# Patient Record
Sex: Male | Born: 1935 | Race: White | Hispanic: No | Marital: Married | Smoking: Never smoker
Health system: Southern US, Community
[De-identification: ages and names within clinical notes are randomized; demographics above are authoritative.]

## PROBLEM LIST (undated history)

## (undated) DIAGNOSIS — F29 Unspecified psychosis not due to a substance or known physiological condition: Secondary | ICD-10-CM

## (undated) DIAGNOSIS — K219 Gastro-esophageal reflux disease without esophagitis: Secondary | ICD-10-CM

## (undated) DIAGNOSIS — E669 Obesity, unspecified: Secondary | ICD-10-CM

## (undated) DIAGNOSIS — R51 Headache: Secondary | ICD-10-CM

## (undated) DIAGNOSIS — R531 Weakness: Secondary | ICD-10-CM

## (undated) DIAGNOSIS — G7000 Myasthenia gravis without (acute) exacerbation: Secondary | ICD-10-CM

## (undated) DIAGNOSIS — R269 Unspecified abnormalities of gait and mobility: Secondary | ICD-10-CM

## (undated) DIAGNOSIS — G2589 Other specified extrapyramidal and movement disorders: Secondary | ICD-10-CM

## (undated) DIAGNOSIS — I10 Essential (primary) hypertension: Secondary | ICD-10-CM

## (undated) DIAGNOSIS — E785 Hyperlipidemia, unspecified: Secondary | ICD-10-CM

## (undated) DIAGNOSIS — G4752 REM sleep behavior disorder: Secondary | ICD-10-CM

## (undated) HISTORY — PX: CATARACT EXTRACTION: SUR2

## (undated) HISTORY — DX: Unspecified abnormalities of gait and mobility: R26.9

## (undated) HISTORY — DX: Unspecified psychosis not due to a substance or known physiological condition: F29

## (undated) HISTORY — DX: Other specified extrapyramidal and movement disorders: G25.89

## (undated) HISTORY — DX: Myasthenia gravis without (acute) exacerbation: G70.00

## (undated) HISTORY — DX: REM sleep behavior disorder: G47.52

## (undated) HISTORY — DX: Weakness: R53.1

---

## 2001-04-19 ENCOUNTER — Ambulatory Visit (HOSPITAL_COMMUNITY): Admission: RE | Admit: 2001-04-19 | Discharge: 2001-04-19 | Payer: Self-pay | Admitting: Gastroenterology

## 2001-04-19 ENCOUNTER — Encounter (INDEPENDENT_AMBULATORY_CARE_PROVIDER_SITE_OTHER): Payer: Self-pay

## 2004-08-15 ENCOUNTER — Ambulatory Visit (HOSPITAL_COMMUNITY): Admission: RE | Admit: 2004-08-15 | Discharge: 2004-08-15 | Payer: Self-pay | Admitting: Internal Medicine

## 2004-08-22 ENCOUNTER — Ambulatory Visit (HOSPITAL_COMMUNITY): Admission: RE | Admit: 2004-08-22 | Discharge: 2004-08-22 | Payer: Self-pay | Admitting: Internal Medicine

## 2007-05-20 ENCOUNTER — Emergency Department (HOSPITAL_COMMUNITY): Admission: EM | Admit: 2007-05-20 | Discharge: 2007-05-20 | Payer: Self-pay | Admitting: Emergency Medicine

## 2010-06-18 ENCOUNTER — Other Ambulatory Visit: Payer: Self-pay | Admitting: Dermatology

## 2011-08-10 ENCOUNTER — Emergency Department (HOSPITAL_COMMUNITY): Payer: Medicare Other

## 2011-08-10 ENCOUNTER — Observation Stay (HOSPITAL_COMMUNITY)
Admission: EM | Admit: 2011-08-10 | Discharge: 2011-08-12 | Disposition: A | Discharge status: Home / Self Care | Payer: Medicare Other | Source: Ambulatory Visit | Attending: Internal Medicine | Admitting: Internal Medicine

## 2011-08-10 ENCOUNTER — Encounter (HOSPITAL_COMMUNITY): Payer: Self-pay | Admitting: *Deleted

## 2011-08-10 DIAGNOSIS — K219 Gastro-esophageal reflux disease without esophagitis: Secondary | ICD-10-CM | POA: Insufficient documentation

## 2011-08-10 DIAGNOSIS — R4182 Altered mental status, unspecified: Secondary | ICD-10-CM | POA: Insufficient documentation

## 2011-08-10 DIAGNOSIS — R471 Dysarthria and anarthria: Principal | ICD-10-CM | POA: Diagnosis present

## 2011-08-10 DIAGNOSIS — R4781 Slurred speech: Secondary | ICD-10-CM

## 2011-08-10 DIAGNOSIS — K117 Disturbances of salivary secretion: Secondary | ICD-10-CM

## 2011-08-10 DIAGNOSIS — E785 Hyperlipidemia, unspecified: Secondary | ICD-10-CM | POA: Diagnosis present

## 2011-08-10 DIAGNOSIS — E669 Obesity, unspecified: Secondary | ICD-10-CM | POA: Insufficient documentation

## 2011-08-10 DIAGNOSIS — I10 Essential (primary) hypertension: Secondary | ICD-10-CM | POA: Diagnosis present

## 2011-08-10 DIAGNOSIS — R1310 Dysphagia, unspecified: Secondary | ICD-10-CM | POA: Insufficient documentation

## 2011-08-10 DIAGNOSIS — R2681 Unsteadiness on feet: Secondary | ICD-10-CM | POA: Diagnosis present

## 2011-08-10 DIAGNOSIS — R269 Unspecified abnormalities of gait and mobility: Secondary | ICD-10-CM | POA: Insufficient documentation

## 2011-08-10 DIAGNOSIS — R410 Disorientation, unspecified: Secondary | ICD-10-CM

## 2011-08-10 HISTORY — DX: Hyperlipidemia, unspecified: E78.5

## 2011-08-10 HISTORY — DX: Essential (primary) hypertension: I10

## 2011-08-10 HISTORY — DX: Obesity, unspecified: E66.9

## 2011-08-10 HISTORY — DX: Headache: R51

## 2011-08-10 HISTORY — DX: Gastro-esophageal reflux disease without esophagitis: K21.9

## 2011-08-10 LAB — CBC
HCT: 42.1 % (ref 39.0–52.0)
MCH: 32.7 pg (ref 26.0–34.0)
MCH: 31.5 pg (ref 26.0–34.0)
MCV: 92.9 fL (ref 78.0–100.0)
MCV: 93.3 fL (ref 78.0–100.0)
Platelets: 121 10*3/uL — ABNORMAL LOW (ref 150–400)
Platelets: 144 10*3/uL — ABNORMAL LOW (ref 150–400)
RBC: 4.53 MIL/uL (ref 4.22–5.81)
RBC: 4.79 MIL/uL (ref 4.22–5.81)
RDW: 15.6 % — ABNORMAL HIGH (ref 11.5–15.5)
RDW: 15.6 % — ABNORMAL HIGH (ref 11.5–15.5)

## 2011-08-10 LAB — CREATININE, SERUM: Creatinine, Ser: 1.41 mg/dL — ABNORMAL HIGH (ref 0.50–1.35)

## 2011-08-10 LAB — URINALYSIS, MICROSCOPIC ONLY
Glucose, UA: NEGATIVE mg/dL
Hgb urine dipstick: NEGATIVE
Ketones, ur: NEGATIVE mg/dL
Leukocytes, UA: NEGATIVE
Nitrite: NEGATIVE
Urobilinogen, UA: 1 mg/dL (ref 0.0–1.0)
pH: 7.5 (ref 5.0–8.0)

## 2011-08-10 LAB — COMPREHENSIVE METABOLIC PANEL
Albumin: 3.6 g/dL (ref 3.5–5.2)
Calcium: 10 mg/dL (ref 8.4–10.5)
Chloride: 105 mEq/L (ref 96–112)
GFR calc Af Amer: 56 mL/min — ABNORMAL LOW (ref >90)
Potassium: 4.1 mEq/L (ref 3.5–5.1)
Sodium: 138 mEq/L (ref 135–145)

## 2011-08-10 LAB — DIFFERENTIAL
Basophils Absolute: 0.1 10*3/uL (ref 0.0–0.1)
Basophils Relative: 1 % (ref 0–1)
Lymphs Abs: 3.7 10*3/uL (ref 0.7–4.0)
Neutro Abs: 5.6 10*3/uL (ref 1.7–7.7)
Neutrophils Relative %: 53 % (ref 43–77)

## 2011-08-10 LAB — PROTIME-INR: Prothrombin Time: 17.3 seconds — ABNORMAL HIGH (ref 11.6–15.2)

## 2011-08-10 MED ORDER — AMITRIPTYLINE HCL 50 MG PO TABS
50.0000 mg | ORAL_TABLET | Freq: Every day | ORAL | Status: DC
  Administered 2011-08-10 – 2011-08-11 (×2): 50 mg via ORAL
  Filled 2011-08-10 (×3): qty 1

## 2011-08-10 MED ORDER — ALUM & MAG HYDROXIDE-SIMETH 200-200-20 MG/5ML PO SUSP
30.0000 mL | Freq: Four times a day (QID) | ORAL | Status: DC | PRN
Start: 2011-08-10 — End: 2011-08-12

## 2011-08-10 MED ORDER — ZOLPIDEM TARTRATE 5 MG PO TABS: 5.0000 mg | ORAL_TABLET | Freq: Every evening | ORAL | Status: DC | PRN

## 2011-08-10 MED ORDER — POTASSIUM CHLORIDE IN NACL 20-0.9 MEQ/L-% IV SOLN
INTRAVENOUS | Status: DC
  Administered 2011-08-10: 23:00:00 via INTRAVENOUS
  Filled 2011-08-10: qty 1000

## 2011-08-10 MED ORDER — FAMOTIDINE 20 MG PO TABS
20.0000 mg | ORAL_TABLET | Freq: Every day | ORAL | Status: DC
  Administered 2011-08-10: 20 mg via ORAL
  Filled 2011-08-10 (×2): qty 1

## 2011-08-10 MED ORDER — ATORVASTATIN CALCIUM 20 MG PO TABS
20.0000 mg | ORAL_TABLET | Freq: Every day | ORAL | Status: DC
  Administered 2011-08-10 – 2011-08-11 (×2): 20 mg via ORAL
  Filled 2011-08-10 (×3): qty 1

## 2011-08-10 MED ORDER — OMEGA-3 FATTY ACIDS 1000 MG PO CAPS: 1.0000 g | ORAL_CAPSULE | Freq: Two times a day (BID) | ORAL | Status: DC

## 2011-08-10 MED ORDER — ACETAMINOPHEN 650 MG RE SUPP: 650.0000 mg | Freq: Four times a day (QID) | RECTAL | Status: DC | PRN

## 2011-08-10 MED ORDER — LISINOPRIL 40 MG PO TABS
40.0000 mg | ORAL_TABLET | Freq: Every day | ORAL | Status: DC
  Administered 2011-08-11 – 2011-08-12 (×2): 40 mg via ORAL
  Filled 2011-08-10 (×2): qty 1

## 2011-08-10 MED ORDER — ACETAMINOPHEN 325 MG PO TABS: 650.0000 mg | ORAL_TABLET | Freq: Four times a day (QID) | ORAL | Status: DC | PRN

## 2011-08-10 MED ORDER — ENOXAPARIN SODIUM 40 MG/0.4ML ~~LOC~~ SOLN
40.0000 mg | SUBCUTANEOUS | Status: DC
  Administered 2011-08-10 – 2011-08-11 (×2): 40 mg via SUBCUTANEOUS
  Filled 2011-08-10 (×3): qty 0.4

## 2011-08-10 MED ORDER — ASPIRIN EC 81 MG PO TBEC
81.0000 mg | DELAYED_RELEASE_TABLET | Freq: Every day | ORAL | Status: DC
  Filled 2011-08-10: qty 1

## 2011-08-10 MED ORDER — VITAMIN D (CHOLECALCIFEROL) 10 MCG (400 UNIT) PO CHEW: 400.0000 [IU] | CHEWABLE_TABLET | Freq: Every morning | ORAL | Status: DC

## 2011-08-10 MED ORDER — ASPIRIN EC 81 MG PO TBEC
81.0000 mg | DELAYED_RELEASE_TABLET | Freq: Every day | ORAL | Status: DC
  Administered 2011-08-10 – 2011-08-11 (×2): 81 mg via ORAL
  Filled 2011-08-10 (×3): qty 1

## 2011-08-10 MED ORDER — SIMVASTATIN 10 MG PO TABS: 10.0000 mg | ORAL_TABLET | Freq: Every day | ORAL | Status: DC

## 2011-08-10 MED ORDER — OMEGA-3-ACID ETHYL ESTERS 1 G PO CAPS
1.0000 g | ORAL_CAPSULE | Freq: Two times a day (BID) | ORAL | Status: DC
  Administered 2011-08-10 – 2011-08-12 (×4): 1 g via ORAL
  Filled 2011-08-10 (×5): qty 1

## 2011-08-10 MED ORDER — THERA M PLUS PO TABS: 1.0000 | ORAL_TABLET | Freq: Every day | ORAL | Status: DC

## 2011-08-10 MED ORDER — ADULT MULTIVITAMIN W/MINERALS CH
1.0000 | ORAL_TABLET | Freq: Every day | ORAL | Status: DC
  Administered 2011-08-11 – 2011-08-12 (×2): 1 via ORAL
  Filled 2011-08-10 (×2): qty 1

## 2011-08-10 MED ORDER — SENNOSIDES-DOCUSATE SODIUM 8.6-50 MG PO TABS: 1.0000 | ORAL_TABLET | Freq: Every evening | ORAL | Status: DC | PRN

## 2011-08-10 MED ORDER — CHOLECALCIFEROL 10 MCG (400 UNIT) PO TABS
400.0000 [IU] | ORAL_TABLET | Freq: Every day | ORAL | Status: DC
  Administered 2011-08-11 – 2011-08-12 (×2): 400 [IU] via ORAL
  Filled 2011-08-10 (×2): qty 1

## 2011-08-10 MED ORDER — SODIUM CHLORIDE 0.9 % IJ SOLN
3.0000 mL | Freq: Two times a day (BID) | INTRAMUSCULAR | Status: DC
  Administered 2011-08-10 – 2011-08-12 (×4): 3 mL via INTRAVENOUS

## 2011-08-10 NOTE — ED Notes (Signed)
Patient transferred to 3000.

## 2011-08-10 NOTE — H&P (Signed)
Glen Finley is an 77 y.o. male.   Chief Complaint: speech difficulties and difficulty walking HPI:  The patient is a 77 year old Caucasian man who is here with multiple family members. The patient and family members describe symptoms over the past month of intermittent slurred speech, mild confusion, and a new shuffling gait. He has not had any recent falls. He has had some drooling from his mouth and some difficulty swallowing and producing speech at times. Currently, he feels that his speech is at his baseline. He has not had symptoms of headache, difficulty swallowing, coughing, significant weakness in his arms or legs, or palpitations. He has no history of TIAs or stroke or arrhythmia. His medical history is most significant for hypertension, hyperlipidemia, migraine headaches that currently not been problematic, gastroesophageal reflux disease, and exogenous obesity.  Past Medical History  Diagnosis Date  . Hypertension   . Headache   . GERD (gastroesophageal reflux disease)   . Hyperlipidemia   . Obesity   . Migraine      (Not in a hospital admission)  ADDITIONAL HOME MEDICATIONS: see pharmacist medicine reconciliation list  PHYSICIANS INVOLVED IN CARE: Creola Corn (PCP)  Past Surgical History  Procedure Date  . No past surgeries     History reviewed. No pertinent family history.   Social History:  reports that he has never smoked. He does not have any smokeless tobacco history on file. He reports that he does not drink alcohol or use illicit drugs.  Allergies:  Allergies  Allergen Reactions  . Codeine Rash     ROS: high blood pressure and Hyperlipidemia, gastroesophageal reflux disease  PHYSICAL EXAM: Blood pressure 141/58, pulse 78, temperature 98 F (36.7 C), temperature source Oral, resp. rate 22, SpO2 98.00%. In general, the patient is an overweight white man who was in no apparent distress while sitting upright on a gurney drinking clear liquids without  difficulty. HEENT exam was within normal limits and there was no facial droop, neck was supple without jugular venous distention or carotid bruit, chest was clear to auscultation, heart had a regular rate and rhythm without significant murmur or gallop, abdomen had normal bowel sounds and no hepatosplenomegaly or tenderness, extremities were without cyanosis, clubbing, or edema and the bilateral pedal pulses were normal. Neurologic exam: He was alert and well oriented with normal affect. He had very mild word finding difficulty. He was able to answer questions appropriately. Cranial nerves II through XII are significant for slightly decreased hearing on the right side, he had intact light touch sensation throughout, motor strength was 5 out of 5 throughout, and bilateral normal finger to nose testing. Gait was not assessed in the emergency room.  Results for orders placed during the hospital encounter of 08/10/11 (from the past 48 hour(s))  GLUCOSE, CAPILLARY     Status: Normal   Collection Time   08/10/11  5:30 PM      Component Value Range Comment   Glucose-Capillary 82  70 - 99 (mg/dL)    Comment 1 Notify RN     CBC     Status: Abnormal   Collection Time   08/10/11  5:52 PM      Component Value Range Comment   WBC 10.5  4.0 - 10.5 (K/uL)    RBC 4.53  4.22 - 5.81 (MIL/uL)    Hemoglobin 14.8  13.0 - 17.0 (g/dL)    HCT 41.3  24.4 - 01.0 (%)    MCV 92.9  78.0 - 100.0 (fL)  MCH 32.7  26.0 - 34.0 (pg)    MCHC 35.2  30.0 - 36.0 (g/dL)    RDW 16.1 (*) 09.6 - 15.5 (%)    Platelets 121 (*) 150 - 400 (K/uL)   DIFFERENTIAL     Status: Normal   Collection Time   08/10/11  5:52 PM      Component Value Range Comment   Neutrophils Relative 53  43 - 77 (%)    Neutro Abs 5.6  1.7 - 7.7 (K/uL)    Lymphocytes Relative 35  12 - 46 (%)    Lymphs Abs 3.7  0.7 - 4.0 (K/uL)    Monocytes Relative 7  3 - 12 (%)    Monocytes Absolute 0.8  0.1 - 1.0 (K/uL)    Eosinophils Relative 4  0 - 5 (%)    Eosinophils  Absolute 0.4  0.0 - 0.7 (K/uL)    Basophils Relative 1  0 - 1 (%)    Basophils Absolute 0.1  0.0 - 0.1 (K/uL)   COMPREHENSIVE METABOLIC PANEL     Status: Abnormal   Collection Time   08/10/11  5:52 PM      Component Value Range Comment   Sodium 138  135 - 145 (mEq/L)    Potassium 4.1  3.5 - 5.1 (mEq/L)    Chloride 105  96 - 112 (mEq/L)    CO2 21  19 - 32 (mEq/L)    Glucose, Bld 84  70 - 99 (mg/dL)    BUN 22  6 - 23 (mg/dL)    Creatinine, Ser 0.45 (*) 0.50 - 1.35 (mg/dL)    Calcium 40.9  8.4 - 10.5 (mg/dL)    Total Protein 7.7  6.0 - 8.3 (g/dL)    Albumin 3.6  3.5 - 5.2 (g/dL)    AST 76 (*) 0 - 37 (U/L)    ALT 55 (*) 0 - 53 (U/L)    Alkaline Phosphatase 72  39 - 117 (U/L)    Total Bilirubin 1.1  0.3 - 1.2 (mg/dL)    GFR calc non Af Amer 48 (*) >90 (mL/min)    GFR calc Af Amer 56 (*) >90 (mL/min)   PROTIME-INR     Status: Abnormal   Collection Time   08/10/11  5:52 PM      Component Value Range Comment   Prothrombin Time 17.3 (*) 11.6 - 15.2 (seconds)    INR 1.39  0.00 - 1.49    URINALYSIS, WITH MICROSCOPIC     Status: Normal   Collection Time   08/10/11  6:26 PM      Component Value Range Comment   Color, Urine YELLOW  YELLOW     APPearance CLEAR  CLEAR     Specific Gravity, Urine 1.011  1.005 - 1.030     pH 7.5  5.0 - 8.0     Glucose, UA NEGATIVE  NEGATIVE (mg/dL)    Hgb urine dipstick NEGATIVE  NEGATIVE     Bilirubin Urine NEGATIVE  NEGATIVE     Ketones, ur NEGATIVE  NEGATIVE (mg/dL)    Protein, ur NEGATIVE  NEGATIVE (mg/dL)    Urobilinogen, UA 1.0  0.0 - 1.0 (mg/dL)    Nitrite NEGATIVE  NEGATIVE     Leukocytes, UA NEGATIVE  NEGATIVE     WBC, UA 0-2  <3 (WBC/hpf)    Squamous Epithelial / LPF RARE  RARE     Ct Head Wo Contrast  08/10/2011  *RADIOLOGY REPORT*  Clinical Data: Weakness, drooling, mental  status changes, slurred speech and dysphagia.  CT HEAD WITHOUT CONTRAST  Technique:  Contiguous axial images were obtained from the base of the skull through the vertex  without contrast.  Comparison: None.  Findings: The brain demonstrates no evidence of hemorrhage, infarction, edema, mass effect, extra-axial fluid collection, hydrocephalus or mass lesion.  The skull is unremarkable.  IMPRESSION: No acute findings.  Original Report Authenticated By: Reola Calkins, M.D.     Assessment/Plan #1 Dysarthria: Uncertain etiology. He may have had a small stroke causing dysarthria and very mild gait instability. This would be in his left brain as he is right-handed. He does not have significant difficulty swallowing. We will admit him to an observation status bed and check a brain MRI scan as well as overnight telemetry. I will deferred to his primary care physician the timing of additional testing including possible carotid ultrasound exam and echocardiogram which would likely be of low yield. #2 Gait Instability: Mild by history, and difficult to assess in the emergency room setting. We will reevaluate this by exam in the morning. #3 Hypertension: Reasonably well controlled on current medications. #4 Hyperlipidemia: Clinically stable on pravastatin.  Dodger Sinning G 08/10/2011, 9:09 PM

## 2011-08-10 NOTE — ED Notes (Signed)
Pt's CBG was 82 when I checked it. 5:31pm JG.

## 2011-08-10 NOTE — ED Notes (Signed)
To ED for eval of drooling for the past month, ams for the past 2 weeks, and increased weakness for the past cple weeks. Wife states she has noted slurred speech and dysphagia also. Pt alert and oriented and is aware of symptoms

## 2011-08-10 NOTE — ED Provider Notes (Addendum)
History     CSN: [id number]  Arrival date & time 08/10/11  1525   First MD Initiated Contact with Patient 08/10/11 1729      Chief Complaint  Patient presents with  . Drooling  . Aphasia  . Altered Mental Status    (Consider location/radiation/quality/duration/timing/severity/associated sxs/prior treatment) Patient is a 77 y.o. male presenting with neurologic complaint. The history is provided by the patient and a relative.  Neurologic Problem The primary symptoms include altered mental status and speech change. Primary symptoms do not include headaches, fever, nausea or vomiting. Episode onset: approx 1 mos ago. The symptoms are worsening. The neurological symptoms are multifocal. Context: spontaneously.  Episode onset: intermittently for 1-2 weeks. The altered mental status developed insidiously. The change in mental status has been gradually worsening since its onset. The change in mental status includes impaired memory, confusion, a disturbance of speech and a gait disturbance.  Time since onset of speech change: 2 weeks ago. The speech change is unchanged. Description of speech change: slurring.  Additional symptoms do not include neck stiffness.    Past Medical History  Diagnosis Date  . Hypertension     History reviewed. No pertinent past surgical history.  History reviewed. No pertinent family history.  History  Substance Use Topics  . Smoking status: Never Smoker   . Smokeless tobacco: Not on file  . Alcohol Use: No      Review of Systems  Constitutional: Negative for fever.  HENT: Negative for rhinorrhea, drooling, neck pain and neck stiffness.   Eyes: Negative for pain.  Respiratory: Negative for cough and shortness of breath.   Cardiovascular: Negative for chest pain and leg swelling.  Gastrointestinal: Negative for nausea, vomiting, abdominal pain and diarrhea.  Genitourinary: Negative for dysuria and hematuria.  Musculoskeletal: Negative for gait  problem.  Skin: Negative for color change.  Neurological: Positive for speech change. Negative for numbness and headaches.  Hematological: Negative for adenopathy.  Psychiatric/Behavioral: Positive for altered mental status. Negative for behavioral problems.  All other systems reviewed and are negative.    Allergies  Codeine  Home Medications   Current Outpatient Rx  Name Route Sig Dispense Refill  . AMITRIPTYLINE HCL 50 MG PO TABS Oral Take 50 mg by mouth at bedtime.    . ASPIRIN EC 81 MG PO TBEC Oral Take 81 mg by mouth at bedtime.    Marland Kitchen CIMETIDINE 400 MG PO TABS Oral Take 400 mg by mouth 2 (two) times daily.    . OMEGA-3 FATTY ACIDS 1000 MG PO CAPS Oral Take 1 g by mouth 2 (two) times daily.    Marland Kitchen LISINOPRIL 40 MG PO TABS Oral Take 40 mg by mouth daily.    Carma Leaven M PLUS PO TABS Oral Take 1 tablet by mouth daily.    Marland Kitchen PRAVASTATIN SODIUM 80 MG PO TABS Oral Take 80 mg by mouth at bedtime.    Marland Kitchen VITAMIN D (CHOLECALCIFEROL) PO Oral Take 1 tablet by mouth daily.      BP 139/62  Pulse 84  Temp(Src) 98.1 F (36.7 C) (Oral)  Resp 18  SpO2 98%  Physical Exam  Constitutional: He is oriented to person, place, and time. He appears well-developed and well-nourished.  HENT:  Head: Normocephalic and atraumatic.  Right Ear: External ear normal.  Left Ear: External ear normal.  Nose: Nose normal.  Mouth/Throat: Oropharynx is clear and moist. No oropharyngeal exudate.  Eyes: Conjunctivae and EOM are normal. Pupils are equal, round, and reactive to  light.  Neck: Normal range of motion. Neck supple.  Cardiovascular: Normal rate, regular rhythm, normal heart sounds and intact distal pulses.  Exam reveals no gallop and no friction rub.   No murmur heard. Pulmonary/Chest: Effort normal and breath sounds normal. No respiratory distress. He has no wheezes.  Abdominal: Soft. Bowel sounds are normal. He exhibits no distension. There is no tenderness.  Musculoskeletal: Normal range of motion. He  exhibits no edema and no tenderness.  Neurological: He is alert and oriented to person, place, and time. He has normal strength. No cranial nerve deficit or sensory deficit. He displays a negative Romberg sign.       Mild difficulty w/ finger to nose testing bilaterally. Mild shuffling gait. Mild slurring of speech per family.   Skin: Skin is warm and dry.  Psychiatric: He has a normal mood and affect. His behavior is normal.    ED Course  Procedures (including critical care time)   Labs Reviewed  GLUCOSE, CAPILLARY  CBC  DIFFERENTIAL  COMPREHENSIVE METABOLIC PANEL  PROTIME-INR  URINALYSIS, WITH MICROSCOPIC  URINE CULTURE   No results found.   No diagnosis found.   Date: 08/11/2011  Rate: 80  Rhythm: normal sinus rhythm  QRS Axis: left  Intervals: QT prolonged  ST/T Wave abnormalities: normal  Conduction Disutrbances:none  Narrative Interpretation: No ST or T wave changes cw ischemia  Old EKG Reviewed: none available    MDM  5:52 PM 77 y.o. male pw AMS, drooling, slurring speech for approx 1 mos. Family notes drooling x 1 mos, slurring speech x 2 weeks, occasional confusion. Pt has been well otherwise, denies N/V/D/F. Pt AFVSS here, difficulty w/ finger to nose, shuffling gait, mild slurring of speech. Will get labs, CT head.   CT head negative. Consulted medicine for admission.   Clinical Impression 1. Drooling   2. Confusion   3. Slurred speech   4. Altered gait         Purvis Sheffield, MD 08/11/11 2130  Purvis Sheffield, MD 08/11/11 [phone]

## 2011-08-11 ENCOUNTER — Observation Stay (HOSPITAL_COMMUNITY): Payer: Medicare Other

## 2011-08-11 DIAGNOSIS — F29 Unspecified psychosis not due to a substance or known physiological condition: Secondary | ICD-10-CM

## 2011-08-11 DIAGNOSIS — R471 Dysarthria and anarthria: Secondary | ICD-10-CM

## 2011-08-11 DIAGNOSIS — R269 Unspecified abnormalities of gait and mobility: Secondary | ICD-10-CM

## 2011-08-11 LAB — COMPREHENSIVE METABOLIC PANEL
ALT: 50 U/L (ref 0–53)
BUN: 22 mg/dL (ref 6–23)
CO2: 22 mEq/L (ref 19–32)
Calcium: 9.7 mg/dL (ref 8.4–10.5)
Creatinine, Ser: 1.29 mg/dL (ref 0.50–1.35)
GFR calc Af Amer: 61 mL/min — ABNORMAL LOW (ref >90)
GFR calc non Af Amer: 53 mL/min — ABNORMAL LOW (ref >90)
Glucose, Bld: 91 mg/dL (ref 70–99)
Sodium: 139 mEq/L (ref 135–145)
Total Protein: 7.2 g/dL (ref 6.0–8.3)

## 2011-08-11 MED ORDER — DONEPEZIL HCL 5 MG PO TABS
5.0000 mg | ORAL_TABLET | Freq: Every day | ORAL | Status: DC
  Administered 2011-08-11: 5 mg via ORAL
  Filled 2011-08-11 (×3): qty 1

## 2011-08-11 NOTE — Progress Notes (Signed)
Subjective: 77 year old male admitted with one month of intermittent slurred speech, mild confusion, and a new shuffling gait. He has not had any recent falls. He has had some drooling from his mouth and some difficulty swallowing and producing speech at times.  His medical history is most significant for hypertension, hyperlipidemia, migraine headaches that currently not been problematic, gastroesophageal reflux disease, and exogenous obesity.  ED W/Up was unremarkable.  Admitted for Observation and continued neurologic workup.  He denies specific complaints.   Objective: Vital signs in last 24 hours: Temp:  [97.5 F (36.4 C)-98.1 F (36.7 C)] 97.5 F (36.4 C) (05/21 0200) Pulse Rate:  [77-84] 80  (05/21 0200) Resp:  [18-26] 19  (05/21 0200) BP: (132-167)/(56-71) 132/56 mmHg (05/21 0200) SpO2:  [95 %-100 %] 95 % (05/21 0200) Weight:  [89.9 kg (198 lb 3.1 oz)] 89.9 kg (198 lb 3.1 oz) (05/20 2240) Weight change:  Last BM Date: 08/09/11  CBG (last 3)   Basename 08/10/11 1730  GLUCAP 82    Intake/Output from previous day: No intake or output data in the 24 hours ending 08/11/11 0717     Physical Exam  General appearance: Awake alert.  NAD Eyes: no scleral icterus Throat: oropharynx dry.  Tongue midline Resp: CTA B Cardio: reg without m GI: soft, non-tender; bowel sounds normal; no masses,  no organomegaly Extremities: no clubbing, cyanosis or edema Neuro.  Every move is slow and deliberate.  Strength equal.  It took a long time to get into position to stand up.  He needed help getting out of bed.  When standing he needed wide based gate and he rocked some.  He is sig fall risk.  Some slowing of Cerebellar signs??   Lab Results:  Basename 08/11/11 0515 08/10/11 2202 08/10/11 1752  NA 139 -- 138  K 4.0 -- 4.1  CL 104 -- 105  CO2 22 -- 21  GLUCOSE 91 -- 84  BUN 22 -- 22  CREATININE 1.29 1.41* --  CALCIUM 9.7 -- 10.0  MG -- -- --  PHOS -- -- --     Basename 08/11/11  0515 08/10/11 1752  AST 66* 76*  ALT 50 55*  ALKPHOS 59 72  BILITOT 1.8* 1.1  PROT 7.2 7.7  ALBUMIN 3.3* 3.6     Basename 08/10/11 2202 08/10/11 1752  WBC 10.9* 10.5  NEUTROABS -- 5.6  HGB 15.1 14.8  HCT 44.7 42.1  MCV 93.3 92.9  PLT 144* 121*    Lab Results  Component Value Date   INR 1.39 08/10/2011    No results found for this basename: CKTOTAL:3,CKMB:3,CKMBINDEX:3,TROPONINI:3 in the last 72 hours  No results found for this basename: TSH,T4TOTAL,FREET3,T3FREE,THYROIDAB in the last 72 hours  No results found for this basename: VITAMINB12:2,FOLATE:2,FERRITIN:2,TIBC:2,IRON:2,RETICCTPCT:2 in the last 72 hours  Micro Results: No results found for this or any previous visit (from the past 240 hour(s)).   Studies/Results: Ct Head Wo Contrast  08/10/2011  *RADIOLOGY REPORT*  Clinical Data: Weakness, drooling, mental status changes, slurred speech and dysphagia.  CT HEAD WITHOUT CONTRAST  Technique:  Contiguous axial images were obtained from the base of the skull through the vertex without contrast.  Comparison: None.  Findings: The brain demonstrates no evidence of hemorrhage, infarction, edema, mass effect, extra-axial fluid collection, hydrocephalus or mass lesion.  The skull is unremarkable.  IMPRESSION: No acute findings.  Original Report Authenticated By: Reola Calkins, M.D.     Medications: Scheduled:   . amitriptyline  50 mg Oral QHS  .  aspirin EC  81 mg Oral QHS  . atorvastatin  20 mg Oral QHS  . cholecalciferol  400 Units Oral Daily  . enoxaparin  40 mg Subcutaneous Q24H  . famotidine  20 mg Oral QHS  . lisinopril  40 mg Oral Daily  . mulitivitamin with minerals  1 tablet Oral Daily  . omega-3 acid ethyl esters  1 g Oral BID  . sodium chloride  3 mL Intravenous Q12H  . DISCONTD: aspirin EC  81 mg Oral Daily  . DISCONTD: fish oil-omega-3 fatty acids  1 g Oral BID  . DISCONTD: multivitamins ther. w/minerals  1 tablet Oral Daily  . DISCONTD: simvastatin  10  mg Oral q1800  . DISCONTD: Vitamin D (Cholecalciferol)  400 Units Oral q morning - 10a   Continuous:   . 0.9 % NaCl with KCl 20 mEq / L 10 mL/hr at 08/10/11 2307     Assessment/Plan: Principal Problem:  *Dysarthria Active Problems:  Gait instability  Hypertension  Hyperlipidemia   2-4 weeks of progressive decline including Dysarthria/Drooling/Confusion/Slurred speech/Altered gait - Uncertain etiology. He may have had a small stroke causing dysarthria and very mild gait instability. CCT (-).  EKG unremarkable. This would be in his left brain as he is right-handed. He does not have significant difficulty swallowing. Observation status.  Brain MRI scan Pending and overnight telemetry done.  PT/OT/SLP consults ordered.  May need Neuro consult as if he did not have a CVA something neurologic is present.  Gait Instability: PT/OT ordered. Hypertension BP decent here in the hospital. Hyperlipidemia - on pravastatin,  migraine headaches that currently not been problematic,  gastroesophageal reflux disease exogenous obesity - needs weight loss. DVT Prophylaxis    LOS: 1 day   Kaelin Holford M 08/11/2011, 7:17 AM
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
## 2011-08-11 NOTE — Evaluation (Signed)
Occupational Therapy Evaluation Patient Details Name: Glen Finley MRN: [id number] DOB: 09-22-1934 Today's Date: 08/11/2011 Time: 6295-2841 OT Time Calculation (min): 24 min  OT Assessment / Plan / Recommendation Clinical Impression  Pt admitted with intermittent, yet new, symptoms over the past month of slurred speech, shuffling gait and mild confusion. Will benefit from skilled OT in the acute setting to maximize I with ADL and ADL mobility prior to d/c home with family    OT Assessment  Patient needs continued OT Services    Follow Up Recommendations  No OT follow up (vs OPOT)    Barriers to Discharge      Equipment Recommendations  None recommended by OT    Recommendations for Other Services    Frequency  Min 2X/week    Precautions / Restrictions Precautions Precautions: Fall Restrictions Weight Bearing Restrictions: No   Pertinent Vitals/Pain Pt denies any pain    ADL  Eating/Feeding: Performed;Independent Where Assessed - Eating/Feeding: Chair Grooming: Performed;Set up;Min guard Where Assessed - Grooming: Unsupported standing Lower Body Bathing: Simulated;Min guard Where Assessed - Lower Body Bathing: Unsupported standing Upper Body Dressing: Set up;Supervision/safety;Performed (gown) Where Assessed - Upper Body Dressing: Unsupported sitting Lower Body Dressing: Performed;Min guard Where Assessed - Lower Body Dressing: Unsupported sit to stand Toilet Transfer: Simulated;Min guard (simulated from EOB) Toilet Transfer Method: Sit to stand Toileting - Architect and Hygiene: Simulated;Min guard Where Assessed - Engineer, mining and Hygiene: Standing Equipment Used: Gait belt Transfers/Ambulation Related to ADLs: Min A (HHA) with ambulation throughout room ADL Comments: pt doing well and is probably near baseline with generalized weakness    OT Diagnosis: Generalized weakness  OT Problem List: Decreased strength;Decreased knowledge of  use of DME or AE;Decreased activity tolerance OT Treatment Interventions: Self-care/ADL training;DME and/or AE instruction;Therapeutic activities;Patient/family education;Balance training   OT Goals Acute Rehab OT Goals OT Goal Formulation: With patient Time For Goal Achievement: 08/18/11 Potential to Achieve Goals: Good ADL Goals Pt Will Perform Grooming: Independently;Standing at sink ADL Goal: Grooming - Progress: Goal set today Pt Will Perform Lower Body Dressing: Independently;Sit to stand from bed;Sit to stand from chair ADL Goal: Lower Body Dressing - Progress: Goal set today Pt Will Transfer to Toilet: Independently;Ambulation;Regular height toilet ADL Goal: Toilet Transfer - Progress: Goal set today Pt Will Perform Toileting - Clothing Manipulation: Independently;Standing ADL Goal: Toileting - Clothing Manipulation - Progress: Goal set today Pt Will Perform Tub/Shower Transfer: Tub transfer;Ambulation ADL Goal: Tub/Shower Transfer - Progress: Goal set today  Visit Information  Last OT Received On: 08/11/11 Assistance Needed: +1    Subjective Data  Subjective: I didn't notice all this stuff, my wife did Patient Stated Goal: return home   Prior Functioning  Home Living Lives With: Spouse Type of Home: House Home Access: Stairs to enter Secretary/administrator of Steps: 2 Entrance Stairs-Rails: Right (and brick wall on other side) Home Layout: One level Bathroom Shower/Tub: Forensic scientist: Standard (vanity beside) Bathroom Accessibility: Yes How Accessible: Accessible via walker Home Adaptive Equipment: Walker - rolling Prior Function Level of Independence: Independent Able to Take Stairs?: Reciprically Driving: Yes Comments: up until recently; pt had begun to require more assist with dressing which prompted wife to bring him into hospital Dominant Hand: Left    Cognition  Overall Cognitive Status: Appears within functional limits for  tasks assessed/performed Arousal/Alertness: Awake/alert Orientation Level: Appears intact for tasks assessed Behavior During Session: [facility] for tasks performed    Extremity/Trunk Assessment Right Upper Extremity Assessment RUE ROM/Strength/Tone:  Within functional levels RUE Sensation: WFL - Light Touch RUE Coordination: WFL - gross/fine motor Left Upper Extremity Assessment LUE ROM/Strength/Tone: Within functional levels LUE Sensation: WFL - Light Touch LUE Coordination: WFL - gross/fine motor   Mobility Bed Mobility Bed Mobility: Supine to Sit Supine to Sit: 5: Supervision;With rails Transfers Transfers: Sit to Stand;Stand to Sit Sit to Stand: 5: Supervision;From bed;From chair/3-in-1 (from chair with armrests) Stand to Sit: 5: Supervision;With armrests;To chair/3-in-1   Exercise    Balance    End of Session OT - End of Session Equipment Utilized During Treatment: Gait belt Activity Tolerance: Patient tolerated treatment well Patient left: with call bell/phone within reach;in chair;with family/visitor present   Tommy Goostree 08/11/2011, 3:23 PM

## 2011-08-11 NOTE — ED Provider Notes (Signed)
  I performed a history and physical examination of Glen Finley and discussed his management with Dr. Romeo Finley.  I agree with the history, physical, assessment, and plan of care, with the following exceptions: None  Insidious decline over the past 2-4 weeks. He's had slurring of speech, drooling, confusion. He's also had difficulty with rising from a seated position as well as gait. Was sent in by Dr. Timothy Finley for further evaluation. On examination he has slight slurring of the speech. Relatively normal neurologic examination. Heart regular rate and rhythm. Lungs are clear. CT was performed relatively unremarkable. Be admitted by Glen Finley  Glen Finley, Glen Mans, MD 08/11/11 [phone]

## 2011-08-11 NOTE — Progress Notes (Signed)
[facility] CONSULT NOTE     Reason for Consult: Shuffling gait, slurred speech   HPI:    Glen Finley is a 77 y.o. male who was admitted with intermittent but new symptoms over the past month which included slurred speech, mild confusion and shuffling gait.  I spoke to wife and daughter. They admit that he has been moving physically slower over the past year, but his mind "is sharp until the past month". His gait has transformed into a shuffle over the year with no falls, but the intermittent confusion over the past month has increased in frequency, so he was brought to the ER. His wife describes that the patient has trouble remembering how to cut his food, buttoning his shirts. In addition, she has seen him drool at times, which was at both sides of his mouth at the same time. She describes that during the drooling episodes, he has a "spell" where he stares out, but is able to be redirected once spoken to. Slurring speech is new over the past month.  His wife feels like he has "flattening" of his face on the left which occurred over the past 2-3 days.   The patient/wife deny any hallucinations or seizure activity, but he states that sometimes his right hand will jerk after holding a book for a couple of minutes. In addition, he states that he sometimes has bad dreams. His wife states that recently while sleeping with him at night, she thought that he was having a bad dream and he "started shaking in bed and hit me". This has only occurred once.   FH significant for depression in father, otherwise negative.   He is awake and alert and answers questions appropriately. He shows no signs of confusion on exam.   CT head: The brain demonstrates no evidence of hemorrhage, infarction, edema, mass effect, extra-axial fluid collection,  hydrocephalus or mass lesion. The skull is unremarkable. Overall impression on the CT report is "no acute findings".  MRI is to be  done today.   Past Medical History  Diagnosis Date  . Hypertension   . Headache   . GERD (gastroesophageal reflux disease)   . Hyperlipidemia   . Obesity   . Migraine     Past Surgical History  Procedure Date  . No past surgeries     History reviewed. No pertinent family history.  Social History:  reports that he has never smoked. He does not have any smokeless tobacco history on file. He reports that he does not drink alcohol or use illicit drugs.  Allergies  Allergen Reactions  . Codeine Rash    Medications:    Prior to Admission:  Prescriptions prior to admission  Medication Sig Dispense Refill  . amitriptyline (ELAVIL) 50 MG tablet Take 50 mg by mouth at bedtime.      Marland Kitchen aspirin EC 81 MG tablet Take 81 mg by mouth at bedtime.      . cimetidine (TAGAMET) 400 MG tablet Take 400 mg by mouth 2 (two) times daily.      . fish oil-omega-3 fatty acids 1000 MG capsule Take 1 g by mouth 2 (two) times daily.      Marland Kitchen lisinopril (PRINIVIL,ZESTRIL) 40 MG tablet Take 40 mg by mouth daily.      . Multiple Vitamins-Minerals (MULTIVITAMINS THER. W/MINERALS) TABS Take 1 tablet by mouth daily.      Marland Kitchen  pravastatin (PRAVACHOL) 80 MG tablet Take 80 mg by mouth at bedtime.      Marland Kitchen VITAMIN D, CHOLECALCIFEROL, PO Take 1 tablet by mouth daily.       Scheduled:   . amitriptyline  50 mg Oral QHS  . aspirin EC  81 mg Oral QHS  . atorvastatin  20 mg Oral QHS  . cholecalciferol  400 Units Oral Daily  . enoxaparin  40 mg Subcutaneous Q24H  . famotidine  20 mg Oral QHS  . lisinopril  40 mg Oral Daily  . mulitivitamin with minerals  1 tablet Oral Daily  . omega-3 acid ethyl esters  1 g Oral BID  . sodium chloride  3 mL Intravenous Q12H  . DISCONTD: aspirin EC  81 mg Oral Daily  . DISCONTD: fish oil-omega-3 fatty acids  1 g Oral BID  . DISCONTD: multivitamins ther. w/minerals  1 tablet Oral Daily  . DISCONTD: simvastatin  10 mg Oral q1800  . DISCONTD: Vitamin D (Cholecalciferol)  400 Units Oral  q morning - 10a    Review of Systems - General ROS: negative for - chills, fatigue, fever or hot flashes Hematological and Lymphatic ROS: negative for - bruising, fatigue, jaundice or pallor Endocrine ROS: negative for - hair pattern changes, hot flashes, mood swings or skin changes Respiratory ROS: negative for - cough, hemoptysis, orthopnea or wheezing Cardiovascular ROS: negative for - dyspnea on exertion, orthopnea, palpitations or shortness of breath Gastrointestinal ROS: negative for - abdominal pain, appetite loss, blood in stools, diarrhea or hematemesis Musculoskeletal ROS: negative for - joint pain, joint stiffness, joint swelling or muscle pain Neurological ROS: positive for - confusion, gait disturbance, memory loss and speech problems Dermatological ROS: negative for dry skin, pruritus and rash   Blood pressure 130/54, pulse 80, temperature 97.8 F (36.6 C), temperature source Oral, resp. rate 17, height 5\' 10"  (1.778 m), weight 89.9 kg (198 lb 3.1 oz), SpO2 95.00%.   Neurologic Examination:   Mental Status: Alert, oriented, thought content appropriate.  Speech fluent without evidence of aphasia. Able to follow 3 step commands. He had difficulty understanding visual field testing, but I redirected the test and he seemed to understand with more prompting and was able to complete testing. Noted flat affect of face. Cranial Nerves: II-Visual fields grossly intact. III/IV/VI-Occasional saccadic intrusions when performing smooth pursuits. Slow to initiate vertical saccades. Otherwise, extraocular movements intact without nystagmus.  Pupils reactive bilaterally. V-Decreased sensation on the left.  VII-Smile symmetric. Decreased facial expressivity with decreased blink rate and a mildly staring quality, but does smile spontaneously twice during the exam.  VIII-grossly intact IX/X-normal gag XI-bilateral shoulder shrug XII-midline tongue extension Motor: 5/5 bilaterally with normal  tone and bulk, no cogwheel rigidity Sensory: Pinprick and light touch intact in upper and lower extremities bilaterally. Deep Tendon Reflexes: Symmetrically hypoactive in all 4 extremities. Plantars downgoing bilaterally Cerebellar: Bradykinetic on finger to nose bilaterally. No dysmetria or dyssinergia. No rest tremor noted.  Gait: Patient has wide stance/gait with decreased length of stride. +Romberg, + retropulsion       Results for orders placed during the hospital encounter of 08/10/11 (from the past 48 hour(s))  GLUCOSE, CAPILLARY     Status: Normal   Collection Time   08/10/11  5:30 PM      Component Value Range Comment   Glucose-Capillary 82  70 - 99 (mg/dL)    Comment 1 Notify RN     CBC     Status: Abnormal   Collection  Time   08/10/11  5:52 PM      Component Value Range Comment   WBC 10.5  4.0 - 10.5 (K/uL)    RBC 4.53  4.22 - 5.81 (MIL/uL)    Hemoglobin 14.8  13.0 - 17.0 (g/dL)    HCT 69.6  29.5 - 28.4 (%)    MCV 92.9  78.0 - 100.0 (fL)    MCH 32.7  26.0 - 34.0 (pg)    MCHC 35.2  30.0 - 36.0 (g/dL)    RDW 13.2 (*) 44.0 - 15.5 (%)    Platelets 121 (*) 150 - 400 (K/uL)   DIFFERENTIAL     Status: Normal   Collection Time   08/10/11  5:52 PM      Component Value Range Comment   Neutrophils Relative 53  43 - 77 (%)    Neutro Abs 5.6  1.7 - 7.7 (K/uL)    Lymphocytes Relative 35  12 - 46 (%)    Lymphs Abs 3.7  0.7 - 4.0 (K/uL)    Monocytes Relative 7  3 - 12 (%)    Monocytes Absolute 0.8  0.1 - 1.0 (K/uL)    Eosinophils Relative 4  0 - 5 (%)    Eosinophils Absolute 0.4  0.0 - 0.7 (K/uL)    Basophils Relative 1  0 - 1 (%)    Basophils Absolute 0.1  0.0 - 0.1 (K/uL)   COMPREHENSIVE METABOLIC PANEL     Status: Abnormal   Collection Time   08/10/11  5:52 PM      Component Value Range Comment   Sodium 138  135 - 145 (mEq/L)    Potassium 4.1  3.5 - 5.1 (mEq/L)    Chloride 105  96 - 112 (mEq/L)    CO2 21  19 - 32 (mEq/L)    Glucose, Bld 84  70 - 99 (mg/dL)    BUN 22  6  - 23 (mg/dL)    Creatinine, Ser 1.02 (*) 0.50 - 1.35 (mg/dL)    Calcium 72.5  8.4 - 10.5 (mg/dL)    Total Protein 7.7  6.0 - 8.3 (g/dL)    Albumin 3.6  3.5 - 5.2 (g/dL)    AST 76 (*) 0 - 37 (U/L)    ALT 55 (*) 0 - 53 (U/L)    Alkaline Phosphatase 72  39 - 117 (U/L)    Total Bilirubin 1.1  0.3 - 1.2 (mg/dL)    GFR calc non Af Amer 48 (*) >90 (mL/min)    GFR calc Af Amer 56 (*) >90 (mL/min)   PROTIME-INR     Status: Abnormal   Collection Time   08/10/11  5:52 PM      Component Value Range Comment   Prothrombin Time 17.3 (*) 11.6 - 15.2 (seconds)    INR 1.39  0.00 - 1.49    URINALYSIS, WITH MICROSCOPIC     Status: Normal   Collection Time   08/10/11  6:26 PM      Component Value Range Comment   Color, Urine YELLOW  YELLOW     APPearance CLEAR  CLEAR     Specific Gravity, Urine 1.011  1.005 - 1.030     pH 7.5  5.0 - 8.0     Glucose, UA NEGATIVE  NEGATIVE (mg/dL)    Hgb urine dipstick NEGATIVE  NEGATIVE     Bilirubin Urine NEGATIVE  NEGATIVE     Ketones, ur NEGATIVE  NEGATIVE (mg/dL)    Protein, ur NEGATIVE  NEGATIVE (mg/dL)    Urobilinogen, UA 1.0  0.0 - 1.0 (mg/dL)    Nitrite NEGATIVE  NEGATIVE     Leukocytes, UA NEGATIVE  NEGATIVE     WBC, UA 0-2  <3 (WBC/hpf)    Squamous Epithelial / LPF RARE  RARE    CBC     Status: Abnormal   Collection Time   08/10/11 10:02 PM      Component Value Range Comment   WBC 10.9 (*) 4.0 - 10.5 (K/uL)    RBC 4.79  4.22 - 5.81 (MIL/uL)    Hemoglobin 15.1  13.0 - 17.0 (g/dL)    HCT 45.4  09.8 - 11.9 (%)    MCV 93.3  78.0 - 100.0 (fL)    MCH 31.5  26.0 - 34.0 (pg)    MCHC 33.8  30.0 - 36.0 (g/dL)    RDW 14.7 (*) 82.9 - 15.5 (%)    Platelets 144 (*) 150 - 400 (K/uL)   CREATININE, SERUM     Status: Abnormal   Collection Time   08/10/11 10:02 PM      Component Value Range Comment   Creatinine, Ser 1.41 (*) 0.50 - 1.35 (mg/dL)    GFR calc non Af Amer 47 (*) >90 (mL/min)    GFR calc Af Amer 55 (*) >90 (mL/min)   COMPREHENSIVE METABOLIC PANEL      Status: Abnormal   Collection Time   08/11/11  5:15 AM      Component Value Range Comment   Sodium 139  135 - 145 (mEq/L)    Potassium 4.0  3.5 - 5.1 (mEq/L)    Chloride 104  96 - 112 (mEq/L)    CO2 22  19 - 32 (mEq/L)    Glucose, Bld 91  70 - 99 (mg/dL)    BUN 22  6 - 23 (mg/dL)    Creatinine, Ser 5.62  0.50 - 1.35 (mg/dL)    Calcium 9.7  8.4 - 10.5 (mg/dL)    Total Protein 7.2  6.0 - 8.3 (g/dL)    Albumin 3.3 (*) 3.5 - 5.2 (g/dL)    AST 66 (*) 0 - 37 (U/L)    ALT 50  0 - 53 (U/L)    Alkaline Phosphatase 59  39 - 117 (U/L)    Total Bilirubin 1.8 (*) 0.3 - 1.2 (mg/dL)    GFR calc non Af Amer 53 (*) >90 (mL/min)    GFR calc Af Amer 61 (*) >90 (mL/min)     Ct Head Wo Contrast  08/10/2011  *RADIOLOGY REPORT*  Clinical Data: Weakness, drooling, mental status changes, slurred speech and dysphagia.  CT HEAD WITHOUT CONTRAST  Technique:  Contiguous axial images were obtained from the base of the skull through the vertex without contrast.  Comparison: None.  Findings: The brain demonstrates no evidence of hemorrhage, infarction, edema, mass effect, extra-axial fluid collection, hydrocephalus or mass lesion.  The skull is unremarkable.  IMPRESSION: No acute findings.  Original Report Authenticated By: Reola Calkins, M.D.     Assessment/Plan:   Assessment: 1) Progressive gait disorder with apraxia, cognitive decline and cognitive fluctuations. 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 DDx includes dementia with Lewy bodies (DLB), corticobasal ganglionic degeneration (CBGD), progressive supranuclear palsy (PSP), multiple systems atrophy type P (MSA-P aka nigrostriatal degeneration).   2) Transient LFT elevation 3) Hypertension 4) Hyperlipidemia 5) Vit D defiency  Recommendations: 1) MRI scheduled for today,  await results.  2) Start trial of Aricept and assess for possible improvement in  patient's cognitive flucutations. Aricept can be used in LBD with some improvement.  3) Avoid the use of neuroleptics in patients with a potential diagnosis of LBD.   4) Outpatient neurology and neuropsychology follow up to narrow the differential diagnosis given above. A movement disorders specialist would be the best option regarding outpatient neurology follow up. Patient may need advanced testing as outpatient, including striatal presynaptic and postsynaptic dopamine receptor binding evaluations with nuclear medicine studies to differentiate Parkinson's disease versus subtypes of Parkinson's plus syndrome. Outpatient neurologist may initiate a trial of levodopa/carbidopa to help differentiate idiopathic Parkinson's disease versus a Parkinson's plus syndrome.  5) Rec: full chemistry workup including tsh, vit d, b12, RPR 6) Speech therapy eval re: home health involvement upon discharge. 7) Rec: holding Tagamet due to risk of hallucinations, LFT elevations, irritability as well as no further Ambien due to fall risk and increased risk of precipitating a confusional state. 8) PT/OT consults. 9) Will continue to follow.  Patient seen and examined by Dr. Otelia Limes.    Gwendolyn Lima. Manson Passey [facility] [phone]  08/11/2011, 9:53 AM  Electronically signed: Dr. Caryl Pina

## 2011-08-12 DIAGNOSIS — F29 Unspecified psychosis not due to a substance or known physiological condition: Secondary | ICD-10-CM

## 2011-08-12 DIAGNOSIS — R471 Dysarthria and anarthria: Secondary | ICD-10-CM

## 2011-08-12 DIAGNOSIS — R269 Unspecified abnormalities of gait and mobility: Secondary | ICD-10-CM

## 2011-08-12 LAB — VITAMIN B12: Vitamin B-12: 1027 pg/mL — ABNORMAL HIGH (ref 211–911)

## 2011-08-12 LAB — TSH: TSH: 2.075 u[IU]/mL (ref 0.350–4.500)

## 2011-08-12 MED ORDER — AMITRIPTYLINE HCL 50 MG PO TABS: ORAL_TABLET | ORAL | Status: DC

## 2011-08-12 MED ORDER — DONEPEZIL HCL 5 MG PO TABS: 5.0000 mg | ORAL_TABLET | Freq: Every day | ORAL | Status: DC

## 2011-08-12 NOTE — Discharge Summary (Signed)
Physician Discharge Summary  DISCHARGE SUMMARY   Patient ID: Glen Finley MR#: [id number] DOB/AGE: Jun 25, 1934 77 y.o.   Attending Physician:Sharay Bellissimo M  Patient's PCP:No primary provider on file.  Consults:Treatment Team:  Kym Groom, MD**  Admit date: 08/10/2011 Discharge date: 08/12/2011  Discharge Diagnoses:  Active Problems:  Dysarthria  Gait instability  Hypertension  Hyperlipidemia   Patient Active Problem List  Diagnoses  . Dysarthria  . Gait instability  . Hypertension  . Hyperlipidemia   Past Medical History  Diagnosis Date  . Hypertension   . Headache   . GERD (gastroesophageal reflux disease)   . Hyperlipidemia   . Obesity   . Migraine     Discharged Condition: stable   Discharge Medications: Medication List  As of 08/12/2011  1:55 PM   STOP taking these medications         cimetidine 400 MG tablet         TAKE these medications         amitriptyline 50 MG tablet   Commonly known as: ELAVIL   1/2 tab for 25mg  PO HS prn sleep.  If we can - we may Discontinue this medicine.      aspirin EC 81 MG tablet   Take 81 mg by mouth at bedtime.      donepezil 5 MG tablet   Commonly known as: ARICEPT   Take 1 tablet (5 mg total) by mouth at bedtime.      fish oil-omega-3 fatty acids 1000 MG capsule   Take 1 g by mouth 2 (two) times daily.      lisinopril 40 MG tablet   Commonly known as: PRINIVIL,ZESTRIL   Take 40 mg by mouth daily.      multivitamins ther. w/minerals Tabs   Take 1 tablet by mouth daily.      pravastatin 80 MG tablet   Commonly known as: PRAVACHOL   Take 80 mg by mouth at bedtime.      VITAMIN D (CHOLECALCIFEROL) PO   Take 1 tablet by mouth daily.             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 Visualized bones and sinuses unremarkable.  IMPRESSION: No orbital metallic foreign body.  Original Report Authenticated By: Lollie Marrow, M.D.   Ct Head Wo Contrast  08/10/2011  *RADIOLOGY REPORT*  Clinical Data: Weakness, drooling, mental status changes, slurred speech and dysphagia.  CT HEAD WITHOUT CONTRAST  Technique:  Contiguous axial images were obtained from the base of the skull through the vertex without contrast.  Comparison: None.  Findings: The brain demonstrates no evidence of hemorrhage, infarction, edema, mass effect, extra-axial fluid collection, hydrocephalus or mass lesion.  The skull is unremarkable.  IMPRESSION: No acute findings.  Original Report Authenticated By: Reola Calkins, M.D.   Mr Brain Wo Contrast  08/11/2011  *RADIOLOGY REPORT*  Clinical Data: Ataxia.  Slurred speech.  Gait instability.  MRI HEAD WITHOUT CONTRAST  Technique:  Multiplanar, multiecho pulse sequences of the brain and surrounding structures were obtained according to standard protocol without intravenous contrast.  Comparison: CT head 08/10/2011  Findings: Age appropriate atrophy.  Mild to moderate chronic microvascular ischemic changes in the deep cerebral white matter bilaterally.  No acute infarct.  Brainstem  and cerebellum are intact.  Vessels at the base the brain are patent.  Negative for hemorrhage or mass lesion.  Paranasal sinuses are clear.  IMPRESSION: Chronic microvascular ischemic changes in the white matter.  No acute infarct.  Original Report Authenticated By: Camelia Phenes, M.D.    History of Present Illness: 77 year old male admitted with one month of intermittent slurred speech, mild confusion, and a new shuffling gait. He has not had any recent falls. He has had some drooling from his mouth and some difficulty swallowing and producing speech at times. His medical history is most significant for hypertension, hyperlipidemia, migraine headaches that currently  not been problematic, gastroesophageal reflux disease, and exogenous obesity. ED W/Up was unremarkable. Admitted for Observation and continued neurologic workup. He denies specific complaints.  Please see Dr Silvano Rusk ED work up.   Hospital Course: He was admitted - Had MRI, Neuro consult, labs, (-)Tele, and PT/OT/SLP. W/up was unrevealing and by the am of 08/12/11 he had improved from admission and was ready for D/C.  For his 2-4 weeks of progressive decline including Dysarthria/Drooling/Confusion/Slurred speech/Altered gait - Uncertain etiology. MRI Unremarkable. Appreciate Neuro consult - please see their report. Telemetry (-). PT/OT/SLP done. RPR (-). TSH fine. B12 fine. Acetylcholine Rec Ab P. Appreciate Neuro Assessment of: 1) Progressive gait disorder with apraxia, cognitive decline and cognitive fluctuations. 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 DDx includes dementia with Lewy bodies (DLB), corticobasal ganglionic degeneration (CBGD), progressive supranuclear palsy (PSP), multiple systems atrophy type P (MSA-P aka nigrostriatal degeneration). From yesterdays note i started Aricept 5 mg, stopped H2 blockers and will continue to avoid neuroleptics. Labs done as well and listed above. They want Outpatient neurology and neuropsychology follow up to narrow the differential diagnosis given above. A movement disorders specialist would be the best option regarding outpatient neurology follow up. Patient may need advanced testing as outpatient, including striatal presynaptic and postsynaptic dopamine receptor binding evaluations with nuclear medicine studies to differentiate Parkinson's disease versus subtypes of Parkinson's plus syndrome. Outpatient neurologist may initiate a trial of levodopa/carbidopa to help differentiate idiopathic Parkinson's disease versus a Parkinson's plus syndrome. In my daily  progress note on 5/22 I wrote that "I would appreciate further deliniation as to whether [facility] or [facility] can handle this or whether he needs referral to [facility] care referral center and who they recommend. I would appreciate if Neuro Hospitalists set him up with an appointment prior to discharge."  The Neuro team saw the pt today and:  1) MRI revealed  Chronic microvascular ischemic changes in the white matter. No acute infarct.  Original Report Authenticated By: Camelia Phenes, M.D.   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 Would recommend changing asa to plavix.   -Followup with primary physician  -Due to event and ischemic white matter changes, would perform carotid dopplers which may be done in the outpatient setting.  2) Mental status changes, intermittent  -Concerning for intermittent, yet multiple small strokes. May have some component of  Underlying confusion at home, although family admits to 1 month. Would recommend trial  of aricept.    - Would discontinue the tagamet as this could increase the risk of hallucinations, transient LFT elevations and irritability. In addition, due to mental status changes and gait disorder, would not recommend sleeping aid at home  -Labs ordered, unremarkable.  3) PT/OT/Speech Therapy as recommended by their service.  No further  neurologic intervention is recommended at this time. If further questions arise, please call or page at that time. Thank you for allowing neurology to participate in the care of this patient.  I held onto pt until @ 2 pm waiting for neuro attending to sign off on their note as the 2 recommendations from 2 different days were vastly different.  I will bring pt back quickly and go over everything, order carotids and refer to [facility].  Will hold on start of Plavix at this point.   Gait Instability:  Did well with OT and no OutPt OT follow up recommended.  Per PT - pt does have mild balance  difficulties and would benefit from OPPT for high level balance to decrease risk of falls.   ST saw pt yesterday and difficulty paying attention when feeding self (e.g., repeatedly cutting food without success and no awareness). *Patient presents with mild cognitive impairment in higher level divided attention, organization, self-monitoring with no awareness of these deficits. Per wife report, these behaviors are not consistent, tend to come and go, which in turn varies how much assistance he is needing at home with medications. Patient has been driving and wife reports 2 recent instances of vearing off the road and she had to get his attention to get back on path. . Recommend HH services.   Hypertension BP fine in the hospital. Stay on Lisinopril  Hyperlipidemia/Elevated TG  - on pravastatin/omega 3.  Last TG 179.  LDL 87,   migraine headaches that currently not been problematic - Rare tramadol.   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 He is on Elavil and I will decrease the dose in case it is affecting him mentally.  he has been on Elavil for a long time.  ? Whether it can be D/Ced.  Fatty Liver and h/O Elevated LFTs- needs weight off.  LFTs remain a little high. exogenous obesity - needs weight loss.   DVT Prophylaxis was provided.   SW helped with D/C planning    Day of Discharge Exam BP 124/65  Pulse 78  Temp(Src) 97 F (36.1 C) (Oral)  Resp 18  Ht 5\' 10"  (1.778 m)  Wt 89.9 kg (198 lb 3.1 oz)  BMI 28.44 kg/m2  SpO2 96%  Physical Exam: See today's PN  Discharge Labs:  [facility] 08/11/11 0515 08/10/11 2202 08/10/11 1752  NA 139 -- 138  K 4.0 -- 4.1  CL 104 -- 105  CO2 22 -- 21  GLUCOSE 91 -- 84  BUN 22 -- 22  CREATININE 1.29 1.41* --  CALCIUM 9.7 -- 10.0  MG -- -- --  PHOS -- -- --    Basename 08/11/11 0515 08/10/11 1752  AST 66* 76*  ALT 50 55*  ALKPHOS 59 72  BILITOT 1.8*  1.1  PROT 7.2 7.7  ALBUMIN 3.3* 3.6    Basename 08/10/11 2202 08/10/11 1752  WBC 10.9* 10.5  NEUTROABS -- 5.6  HGB 15.1 14.8  HCT 44.7 42.1  MCV 93.3 92.9  PLT 144* 121*   No results found for this basename: CKTOTAL:3,CKMB:3,CKMBINDEX:3,TROPONINI:3 in the last 72 hours  Basename 08/11/11 1907  TSH 2.075  T4TOTAL --  T3FREE --  THYROIDAB --    Basename 08/11/11 1907  VITAMINB12 1027*  FOLATE --  FERRITIN --  TIBC --  IRON --  RETICCTPCT --   Lab Results  Component Value Date   INR 1.39 08/10/2011  Discharge instructions:  Final discharge disposition not confirmed Follow-up Information    Follow up with Gwen Pounds, MD in 2 weeks.   Contact information:   [address], [place]. [facility] [place] [address] [phone]           Disposition: home  Follow-up Appts: Follow-up with Dr. Timothy Lasso at [facility] in 2 weeks.  Call for appointment.  Condition on Discharge: stable  Tests Needing Follow-up: Needs outpatient Neuro eval  Time spent in discharge (includes decision making & examination of pt):31min  Signed: Gwen Pounds 08/12/2011, 1:55 PM

## 2011-08-12 NOTE — Progress Notes (Signed)
Occupational Therapy Treatment Patient Details Name: Glen Finley MRN: [id number] DOB: 1934/09/02 Today's Date: 08/12/2011 Time: 0865-7846 OT Time Calculation (min): 21 min  OT Assessment / Plan / Recommendation Comments on Treatment Session Pt and family feel pt is back to baseline.  Pt. demonstrates LOB with ambulation in room. Recommend use of tub seat at home and direct supervision/assist when up at home.  Family verbalizes understanding of this    Follow Up Recommendations  No OT follow up;Supervision/Assistance - 24 hour    Barriers to Discharge       Equipment Recommendations  None recommended by OT    Recommendations for Other Services    Frequency Min 2X/week   Plan Discharge plan remains appropriate    Precautions / Restrictions Precautions Precautions: Fall       ADL  Tub/Shower Transfer: Simulated;Min guard (with UE support.  ) Tub/Shower Transfer Method: Science writer: Shower seat with back Transfers/Ambulation Related to ADLs: Pt. requires min guard assist.  Pt. noted with 2 LOB during OT session ADL Comments: Pt, wife and dtr feel pt. is back to baseline.  Pt. performed simulated tub transfer and when returning to chair pt. demonstrated LOB x 1.  Pt. then asked to ambulate to sink and demonstrated second LOB.  Wife reports this is not uncommon for pt.  Instructed them to use tub seat in tub, and that pt. should have direct assist/supervision  when transferring into/out of tub and when ambulating.  They verbalized understanding    OT Diagnosis:    OT Problem List:   OT Treatment Interventions:     OT Goals ADL Goals ADL Goal: Lower Body Dressing - Progress: Progressing toward goals ADL Goal: Toilet Transfer - Progress: Progressing toward goals ADL Goal: Tub/Shower Transfer - Progress: Met  Visit Information  Last OT Received On: 08/12/11    Subjective Data      Prior Functioning       Cognition  Overall Cognitive Status:  Appears within functional limits for tasks assessed/performed Arousal/Alertness: Awake/alert Orientation Level: Appears intact for tasks assessed Behavior During Session: [facility] for tasks performed    Mobility Transfers Transfers: Sit to Stand;Stand to Sit Sit to Stand: 6: Modified independent (Device/Increase time);With upper extremity assist;From bed Stand to Sit: 6: Modified independent (Device/Increase time);With upper extremity assist;With armrests;To chair/3-in-1   Exercises    Balance Balance Balance Assessed: Yes Dynamic Standing Balance Dynamic Standing - Level of Assistance: 5: Stand by assistance High Level Balance High Level Balance Activites:  (ADLs)  End of Session OT - End of Session Activity Tolerance: Patient tolerated treatment well Patient left: with call bell/phone within reach;in chair;with family/visitor present   Glen Finley, Ursula Alert M 08/12/2011, 4:18 PM

## 2011-08-12 NOTE — Evaluation (Signed)
Physical Therapy Evaluation Patient Details Name: Glen Finley MRN: [id number] DOB: [birth year] Today's Date: 08/12/2011 Time:  -     PT Assessment / Plan / Recommendation Clinical Impression  pt presents with confusion and gait abnormality.  Per wife pt appears to be near baseline level of function.  pt does have mild balance difficulties and would benefit from OPPT for high level balance to decrease risk of falls.      PT Assessment  All further PT needs can be met in the next venue of care    Follow Up Recommendations  Outpatient PT    Barriers to Discharge        lEquipment Recommendations  None recommended by PT    Recommendations for Other Services     Frequency      Precautions / Restrictions     Pertinent Vitals/Pain None      Mobility  Bed Mobility Bed Mobility: Supine to Sit Supine to Sit: 6: Modified independent (Device/Increase time) Details for Bed Mobility Assistance: Needed increased time, but able to complete without A.   Transfers Transfers: Sit to Stand;Stand to Sit Sit to Stand: 6: Modified independent (Device/Increase time);With upper extremity assist;From bed Stand to Sit: 6: Modified independent (Device/Increase time);With upper extremity assist;With armrests;To chair/3-in-1 Details for Transfer Assistance: Again needing increased time, but demos good safety.   Ambulation/Gait Ambulation/Gait Assistance: 6: Modified independent (Device/Increase time) Ambulation Distance (Feet): 225 Feet Assistive device: None Ambulation/Gait Assistance Details: pt moves slowly with small stride length.  Per wife pt looks to be back at baseline.   Gait Pattern: Step-through pattern;Decreased stride length;Wide base of support Stairs: Yes Stairs Assistance: 6: Modified independent (Device/Increase time) Stair Management Technique: One rail Left;Forwards Number of Stairs: 2  Wheelchair Mobility Wheelchair Mobility: No    Exercises     PT Diagnosis: Difficulty  walking  PT Problem List: Decreased balance;Decreased mobility PT Treatment Interventions:     PT Goals    Visit Information  Last PT Received On: 08/12/11 Assistance Needed: +1    Subjective Data  Patient Stated Goal: Independence   Prior Functioning  Home Living Lives With: Spouse Type of Home: House Home Access: Stairs to enter Secretary/administrator of Steps: 2 Entrance Stairs-Rails: Right (and brick wall on other side) Home Layout: One level Bathroom Shower/Tub: Forensic scientist: Standard (vanity beside) Bathroom Accessibility: Yes How Accessible: Accessible via walker Home Adaptive Equipment: Walker - rolling Prior Function Level of Independence: Independent Able to Take Stairs?: Reciprically Driving: Yes Vocation: Retired Comments: Recently pt has begun to require more A secondary to Confusion.   Dominant Hand: Left    Cognition  Overall Cognitive Status: Appears within functional limits for tasks assessed/performed Arousal/Alertness: Awake/alert Orientation Level: Appears intact for tasks assessed Behavior During Session: [facility] for tasks performed    Extremity/Trunk Assessment Right Lower Extremity Assessment RLE ROM/Strength/Tone: Within functional levels RLE Sensation: WFL - Light Touch Left Lower Extremity Assessment LLE ROM/Strength/Tone: Within functional levels LLE Sensation: WFL - Light Touch   Balance Balance Balance Assessed: No Standardized Balance Assessment Standardized Balance Assessment: Dynamic Gait Index Dynamic Gait Index Level Surface: Normal Change in Gait Speed: Mild Impairment Gait with Horizontal Head Turns: Mild Impairment Gait with Vertical Head Turns: Mild Impairment Gait and Pivot Turn: Normal Step Over Obstacle: Mild Impairment Step Around Obstacles: Normal Steps: Mild Impairment Total Score: 19   End of Session PT - End of Session Equipment Utilized During Treatment: Gait belt Activity Tolerance:  Patient tolerated treatment  well Patient left: in chair;with call bell/phone within reach;with family/visitor present Nurse Communication: Mobility status   [name], [place] [phone] 08/12/2011, 9:20 AM

## 2011-08-12 NOTE — Progress Notes (Signed)
Subjective: 77 year old male admitted with one month of intermittent slurred speech, mild confusion, drooling and a new shuffling gait. He has not had any recent falls.  Wife and the pt think he is doing better thia am. I discussed the neuro consult and the MRI results. He is not confused and he can get in/out of bed easier today and is near his most recent baseline.  Objective: Vital signs in last 24 hours: Temp:  [96.7 F (35.9 C)-98 F (36.7 C)] 96.7 F (35.9 C) (05/22 0600) Pulse Rate:  [77-86] 77  (05/22 0600) Resp:  [18-20] 18  (05/22 0600) BP: (124-132)/(52-67) 132/67 mmHg (05/22 0600) SpO2:  [93 %-97 %] 94 % (05/22 0600) Weight change:  Last BM Date: 08/09/11  CBG (last 3)   Basename 08/10/11 1730  GLUCAP 82    Intake/Output from previous day: No intake or output data in the 24 hours ending 08/12/11 0748     Physical Exam  General appearance: Awake alert.  NAD Eyes: no scleral icterus Throat: oropharynx dry.  Tongue midline Resp: CTA B Cardio: reg without m GI: soft, non-tender; bowel sounds normal; no masses,  no organomegaly Extremities: no clubbing, cyanosis or edema Neuro.  Moving more fluid and able to get out of bed and stand with wide based gait - easier.  Strength equal.   Lab Results:  Basename 08/11/11 0515 08/10/11 2202 08/10/11 1752  NA 139 -- 138  K 4.0 -- 4.1  CL 104 -- 105  CO2 22 -- 21  GLUCOSE 91 -- 84  BUN 22 -- 22  CREATININE 1.29 1.41* --  CALCIUM 9.7 -- 10.0  MG -- -- --  PHOS -- -- --     Basename 08/11/11 0515 08/10/11 1752  AST 66* 76*  ALT 50 55*  ALKPHOS 59 72  BILITOT 1.8* 1.1  PROT 7.2 7.7  ALBUMIN 3.3* 3.6     Basename 08/10/11 2202 08/10/11 1752  WBC 10.9* 10.5  NEUTROABS -- 5.6  HGB 15.1 14.8  HCT 44.7 42.1  MCV 93.3 92.9  PLT 144* 121*    Lab Results  Component Value Date   INR 1.39 08/10/2011    No results found for this basename: CKTOTAL:3,CKMB:3,CKMBINDEX:3,TROPONINI:3 in the last 72  hours   Basename 08/11/11 1907  TSH 2.075  T4TOTAL --  T3FREE --  THYROIDAB --     Basename 08/11/11 1907  VITAMINB12 1027*  FOLATE --  FERRITIN --  TIBC --  IRON --  RETICCTPCT --    Micro Results: Recent Results (from the past 240 hour(s))  URINE CULTURE     Status: Normal (Preliminary result)   Collection Time   08/10/11  6:27 PM      Component Value Range Status Comment   Specimen Description URINE, CLEAN CATCH   Final    Special Requests NONE   Final    Culture  Setup Time [id number]   Final    Colony Count PENDING   Incomplete    Culture Culture reincubated for better growth   Final    Report Status PENDING   Incomplete      Studies/Results: Dg Eye Foreign Body  08/11/2011  *RADIOLOGY REPORT*  Clinical Data: History welding and grinding, 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 Visualized bones and sinuses unremarkable.  IMPRESSION: No orbital metallic foreign body.  Original Report Authenticated By: Redge Gainer.  Tyron Russell, M.D.   Ct Head Wo Contrast  08/10/2011  *RADIOLOGY REPORT*  Clinical Data: Weakness, drooling, mental status changes, slurred speech and dysphagia.  CT HEAD WITHOUT CONTRAST  Technique:  Contiguous axial images were obtained from the base of the skull through the vertex without contrast.  Comparison: None.  Findings: The brain demonstrates no evidence of hemorrhage, infarction, edema, mass effect, extra-axial fluid collection, hydrocephalus or mass lesion.  The skull is unremarkable.  IMPRESSION: No acute findings.  Original Report Authenticated By: Reola Calkins, M.D.   Mr Brain Wo Contrast  08/11/2011  *RADIOLOGY REPORT*  Clinical Data: Ataxia.  Slurred speech.  Gait instability.  MRI HEAD WITHOUT CONTRAST  Technique:  Multiplanar, multiecho pulse sequences of the brain and surrounding structures were obtained according to standard  protocol without intravenous contrast.  Comparison: CT head 08/10/2011  Findings: Age appropriate atrophy.  Mild to moderate chronic microvascular ischemic changes in the deep cerebral white matter bilaterally.  No acute infarct.  Brainstem and cerebellum are intact.  Vessels at the base the brain are patent.  Negative for hemorrhage or mass lesion.  Paranasal sinuses are clear.  IMPRESSION: Chronic microvascular ischemic changes in the white matter.  No acute infarct.  Original Report Authenticated By: Camelia Phenes, M.D.     Medications: Scheduled:    . amitriptyline  50 mg Oral QHS  . aspirin EC  81 mg Oral QHS  . atorvastatin  20 mg Oral QHS  . cholecalciferol  400 Units Oral Daily  . donepezil  5 mg Oral QHS  . enoxaparin  40 mg Subcutaneous Q24H  . lisinopril  40 mg Oral Daily  . mulitivitamin with minerals  1 tablet Oral Daily  . omega-3 acid ethyl esters  1 g Oral BID  . sodium chloride  3 mL Intravenous Q12H  . DISCONTD: famotidine  20 mg Oral QHS   Continuous:    . 0.9 % NaCl with KCl 20 mEq / L 10 mL/hr at 08/10/11 2307     Assessment/Plan: Principal Problem:  *Dysarthria Active Problems:  Gait instability  Hypertension  Hyperlipidemia   2-4 weeks of progressive decline including Dysarthria/Drooling/Confusion/Slurred speech/Altered gait - Uncertain etiology. MRI Unremarkable.  Appreciate Neuro consult.  Observation status and should go home later today. telemetry (-).  PT/OT/SLP.  RPR (-).  TSH fine.  B12 fine.  Acetylcholine Rec Ab P.  Appreciate Neuro Assessment of: 1) Progressive gait disorder with apraxia, cognitive decline and cognitive fluctuations. 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 DDx includes dementia with Lewy bodies (DLB), corticobasal ganglionic degeneration (CBGD), progressive supranuclear palsy (PSP), multiple systems atrophy type P (MSA-P aka  nigrostriatal degeneration).  From yesterdays note i started Aricept 5 mg, stopped H2 blockers and will continue to avoid neuroleptics. Labs done as well and listed above.  They want Outpatient neurology and neuropsychology follow up to narrow the differential diagnosis given above. A movement disorders specialist would be the best option regarding outpatient neurology follow up. Patient may need advanced testing as outpatient, including striatal presynaptic and postsynaptic dopamine receptor binding evaluations with nuclear medicine studies to differentiate Parkinson's disease versus subtypes of Parkinson's plus syndrome. Outpatient neurologist may initiate a trial of levodopa/carbidopa to help differentiate idiopathic Parkinson's disease versus a Parkinson's plus syndrome. I would appreciate further deliniation as to whether [facility] or [facility] can handle this or whether he needs referral to [facility] care referral center  and who they recommend.  I would appreciate if Neuro Hospitalists set him up with an appointment prior to discharge.   Gait Instability: PT.  Did well with OT and no OutPt OT follow up recommended. ST saw pt yesterday and difficulty paying attention when feeding self (e.g., repeatedly cutting food without success and no awareness). *Patient presents with mild cognitive impairment in higher level divided attention, organization, self-monitoring with no awareness of these deficits. Per wife report, these behaviors are not consistent, tend to come and go, which in turn varies how much assistance he is needing at home with medications. Patient has been driving and wife reports 2 recent instances of vearing off the road and she had to get his attention to get back on path. . Recommend HH services.   Hypertension BP fine in the hospital. Hyperlipidemia - on pravastatin,  migraine headaches that currently not been problematic,  gastroesophageal reflux disease exogenous obesity -  needs weight loss. DVT Prophylaxis  Will get SW to help with D/C planning    LOS: 2 days   Dashaun Onstott M 08/12/2011, 7:48 AM

## 2011-08-12 NOTE — Progress Notes (Signed)
[facility] PROGRESS NOTE    SUBJECTIVE   Patient feels better today. His wife says that he is looks like he was back to normal as far as his speech, thought process and facial features. He continues to have shuffling gait which he demonstrated today but unchanged from over the past year per wife.  OBJECTIVE   Vital signs in last 24 hours: Temp:  [96.7 F (35.9 C)-98 F (36.7 C)] 96.7 F (35.9 C) (05/22 0600) Pulse Rate:  [77-86] 77  (05/22 0600) Resp:  [18-20] 18  (05/22 0600) BP: (124-132)/(52-67) 132/67 mmHg (05/22 0600) SpO2:  [93 %-97 %] 94 % (05/22 0600)     Nutritional status: General  Past Medical History  Diagnosis Date  . Hypertension   . Headache   . GERD (gastroesophageal reflux disease)   . Hyperlipidemia   . Obesity   . Migraine     Neurologic Exam:   Mental Status: Alert, oriented, thought content appropriate.  Speech fluent without evidence of aphasia. Able to follow 3 step commands without difficulty. Cranial Nerves: II-Visual fields grossly intact. III/IV/VI-Extraocular movements intact.  Pupils reactive bilaterally. V/VII-Smile symmetric VIII-grossly intact IX/X-normal gag XI-bilateral shoulder shrug XII-midline tongue extension Motor: 5/5 bilaterally with normal tone and bulk Sensory: Pinprick and light touch intact throughout, bilaterally Deep Tendon Reflexes: 2+ and symmetric throughout Plantars downgoing bilaterally Cerebellar: Normal finger-to-nose, normal rapid alternating movements and normal heel-to-shin test.   Gait: Normal gait and station.   Neuro exam did not show previously noted saccadic intrusions with smooth pursuits, or slowness when initiating vertical saccades. He was able to follow all commands without delay. Does not appear to have flat affect today and facial symmetry is noted today.  Lab Results:  Results for orders placed during the hospital encounter of 08/10/11 (from  the past 48 hour(s))  GLUCOSE, CAPILLARY     Status: Normal   Collection Time   08/10/11  5:30 PM      Component Value Range Comment   Glucose-Capillary 82  70 - 99 (mg/dL)    Comment 1 Notify RN     CBC     Status: Abnormal   Collection Time   08/10/11  5:52 PM      Component Value Range Comment   WBC 10.5  4.0 - 10.5 (K/uL)    RBC 4.53  4.22 - 5.81 (MIL/uL)    Hemoglobin 14.8  13.0 - 17.0 (g/dL)    HCT 82.9  56.2 - 13.0 (%)    MCV 92.9  78.0 - 100.0 (fL)    MCH 32.7  26.0 - 34.0 (pg)    MCHC 35.2  30.0 - 36.0 (g/dL)    RDW 86.5 (*) 78.4 - 15.5 (%)    Platelets 121 (*) 150 - 400 (K/uL)   DIFFERENTIAL     Status: Normal   Collection Time   08/10/11  5:52 PM      Component Value Range Comment   Neutrophils Relative 53  43 - 77 (%)    Neutro Abs 5.6  1.7 - 7.7 (K/uL)    Lymphocytes Relative 35  12 - 46 (%)    Lymphs Abs 3.7  0.7 - 4.0 (K/uL)    Monocytes Relative 7  3 - 12 (%)    Monocytes  Absolute 0.8  0.1 - 1.0 (K/uL)    Eosinophils Relative 4  0 - 5 (%)    Eosinophils Absolute 0.4  0.0 - 0.7 (K/uL)    Basophils Relative 1  0 - 1 (%)    Basophils Absolute 0.1  0.0 - 0.1 (K/uL)   COMPREHENSIVE METABOLIC PANEL     Status: Abnormal   Collection Time   08/10/11  5:52 PM      Component Value Range Comment   Sodium 138  135 - 145 (mEq/L)    Potassium 4.1  3.5 - 5.1 (mEq/L)    Chloride 105  96 - 112 (mEq/L)    CO2 21  19 - 32 (mEq/L)    Glucose, Bld 84  70 - 99 (mg/dL)    BUN 22  6 - 23 (mg/dL)    Creatinine, Ser 4.09 (*) 0.50 - 1.35 (mg/dL)    Calcium 81.1  8.4 - 10.5 (mg/dL)    Total Protein 7.7  6.0 - 8.3 (g/dL)    Albumin 3.6  3.5 - 5.2 (g/dL)    AST 76 (*) 0 - 37 (U/L)    ALT 55 (*) 0 - 53 (U/L)    Alkaline Phosphatase 72  39 - 117 (U/L)    Total Bilirubin 1.1  0.3 - 1.2 (mg/dL)    GFR calc non Af Amer 48 (*) >90 (mL/min)    GFR calc Af Amer 56 (*) >90 (mL/min)   PROTIME-INR     Status: Abnormal   Collection Time   08/10/11  5:52 PM      Component Value Range Comment    Prothrombin Time 17.3 (*) 11.6 - 15.2 (seconds)    INR 1.39  0.00 - 1.49    URINALYSIS, WITH MICROSCOPIC     Status: Normal   Collection Time   08/10/11  6:26 PM      Component Value Range Comment   Color, Urine YELLOW  YELLOW     APPearance CLEAR  CLEAR     Specific Gravity, Urine 1.011  1.005 - 1.030     pH 7.5  5.0 - 8.0     Glucose, UA NEGATIVE  NEGATIVE (mg/dL)    Hgb urine dipstick NEGATIVE  NEGATIVE     Bilirubin Urine NEGATIVE  NEGATIVE     Ketones, ur NEGATIVE  NEGATIVE (mg/dL)    Protein, ur NEGATIVE  NEGATIVE (mg/dL)    Urobilinogen, UA 1.0  0.0 - 1.0 (mg/dL)    Nitrite NEGATIVE  NEGATIVE     Leukocytes, UA NEGATIVE  NEGATIVE     WBC, UA 0-2  <3 (WBC/hpf)    Squamous Epithelial / LPF RARE  RARE    URINE CULTURE     Status: Normal (Preliminary result)   Collection Time   08/10/11  6:27 PM      Component Value Range Comment   Specimen Description URINE, CLEAN CATCH      Special Requests NONE      Culture  Setup Time [id number]      Colony Count PENDING      Culture Culture reincubated for better growth      Report Status PENDING     CBC     Status: Abnormal   Collection Time   08/10/11 10:02 PM      Component Value Range Comment   WBC 10.9 (*) 4.0 - 10.5 (K/uL)    RBC 4.79  4.22 - 5.81 (MIL/uL)    Hemoglobin 15.1  13.0 - 17.0 (g/dL)  HCT 44.7  39.0 - 52.0 (%)    MCV 93.3  78.0 - 100.0 (fL)    MCH 31.5  26.0 - 34.0 (pg)    MCHC 33.8  30.0 - 36.0 (g/dL)    RDW 98.1 (*) 19.1 - 15.5 (%)    Platelets 144 (*) 150 - 400 (K/uL)   CREATININE, SERUM     Status: Abnormal   Collection Time   08/10/11 10:02 PM      Component Value Range Comment   Creatinine, Ser 1.41 (*) 0.50 - 1.35 (mg/dL)    GFR calc non Af Amer 47 (*) >90 (mL/min)    GFR calc Af Amer 55 (*) >90 (mL/min)   COMPREHENSIVE METABOLIC PANEL     Status: Abnormal   Collection Time   08/11/11  5:15 AM      Component Value Range Comment   Sodium 139  135 - 145 (mEq/L)    Potassium 4.0  3.5 - 5.1 (mEq/L)     Chloride 104  96 - 112 (mEq/L)    CO2 22  19 - 32 (mEq/L)    Glucose, Bld 91  70 - 99 (mg/dL)    BUN 22  6 - 23 (mg/dL)    Creatinine, Ser 4.78  0.50 - 1.35 (mg/dL)    Calcium 9.7  8.4 - 10.5 (mg/dL)    Total Protein 7.2  6.0 - 8.3 (g/dL)    Albumin 3.3 (*) 3.5 - 5.2 (g/dL)    AST 66 (*) 0 - 37 (U/L)    ALT 50  0 - 53 (U/L)    Alkaline Phosphatase 59  39 - 117 (U/L)    Total Bilirubin 1.8 (*) 0.3 - 1.2 (mg/dL)    GFR calc non Af Amer 53 (*) >90 (mL/min)    GFR calc Af Amer 61 (*) >90 (mL/min)   RPR     Status: Normal   Collection Time   08/11/11  7:07 PM      Component Value Range Comment   RPR NON REACTIVE  NON REACTIVE    VITAMIN B12     Status: Abnormal   Collection Time   08/11/11  7:07 PM      Component Value Range Comment   Vitamin B-12 1027 (*) 211 - 911 (pg/mL)   TSH     Status: Normal   Collection Time   08/11/11  7:07 PM      Component Value Range Comment   TSH 2.075  0.350 - 4.500 (uIU/mL)    No results found for this basename: cbc, bmp, coags, chol, tri, ldl, hga1c   Lipid Panel No results found for this basename: CHOL,TRIG,HDL,CHOLHDL,VLDL,LDLCALC in the last 72 hours  Studies/Results: Dg Eye Foreign Body  08/11/2011  *RADIOLOGY REPORT*  Clinical Data: History welding and grinding, 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 Visualized bones and sinuses unremarkable.  IMPRESSION: No orbital metallic foreign body.  Original Report Authenticated By: Lollie Marrow, M.D.   Ct Head Wo Contrast  08/10/2011  *RADIOLOGY REPORT*  Clinical Data: Weakness, drooling, mental status changes, slurred speech and dysphagia.  CT HEAD WITHOUT CONTRAST  Technique:  Contiguous axial images were obtained from the base of the skull through the vertex without contrast.  Comparison: None.  Findings: The brain demonstrates no evidence of hemorrhage, infarction, edema, mass  effect, extra-axial fluid collection, hydrocephalus or mass lesion.  The skull is unremarkable.  IMPRESSION: No acute findings.  Original Report Authenticated By: Reola Calkins, M.D.   Mr Brain Wo Contrast  08/11/2011  *RADIOLOGY REPORT*  Clinical Data: Ataxia.  Slurred speech.  Gait instability.  MRI HEAD WITHOUT CONTRAST  Technique:  Multiplanar, multiecho pulse sequences of the brain and surrounding structures were obtained according to standard protocol without intravenous contrast.  Comparison: CT head 08/10/2011  Findings: Age appropriate atrophy.  Mild to moderate chronic microvascular ischemic changes in the deep cerebral white matter bilaterally.  No acute infarct.  Brainstem and cerebellum are intact.  Vessels at the base the brain are patent.  Negative for hemorrhage or mass lesion.  Paranasal sinuses are clear.  IMPRESSION: Chronic microvascular ischemic changes in the white matter.  No acute infarct.  Original Report Authenticated By: Camelia Phenes, M.D.    Medications:     Prior to Admission:  Prescriptions prior to admission  Medication Sig Dispense Refill  . aspirin EC 81 MG tablet Take 81 mg by mouth at bedtime.      . fish oil-omega-3 fatty acids 1000 MG capsule Take 1 g by mouth 2 (two) times daily.      Marland Kitchen lisinopril (PRINIVIL,ZESTRIL) 40 MG tablet Take 40 mg by mouth daily.      . Multiple Vitamins-Minerals (MULTIVITAMINS THER. W/MINERALS) TABS Take 1 tablet by mouth daily.      . pravastatin (PRAVACHOL) 80 MG tablet Take 80 mg by mouth at bedtime.      Marland Kitchen VITAMIN D, CHOLECALCIFEROL, PO Take 1 tablet by mouth daily.      Marland Kitchen DISCONTD: amitriptyline (ELAVIL) 50 MG tablet Take 50 mg by mouth at bedtime.      Marland Kitchen DISCONTD: cimetidine (TAGAMET) 400 MG tablet Take 400 mg by mouth 2 (two) times daily.       Scheduled:   . amitriptyline  50 mg Oral QHS  . aspirin EC  81 mg Oral QHS  . atorvastatin  20 mg Oral QHS  . cholecalciferol  400 Units Oral Daily  . donepezil  5 mg Oral  QHS  . enoxaparin  40 mg Subcutaneous Q24H  . lisinopril  40 mg Oral Daily  . mulitivitamin with minerals  1 tablet Oral Daily  . omega-3 acid ethyl esters  1 g Oral BID  . sodium chloride  3 mL Intravenous Q12H  . DISCONTD: famotidine  20 mg Oral QHS    IMPRESSION:  Chronic microvascular ischemic changes in the white matter. No  acute infarct.  Original Report Authenticated By: Camelia Phenes, M.D.       Assessment/Plan:   1) Progressive gait disorder with apraxia, cognitive decline and cognitive fluctuations.  2) Transient LFT elevation  3) Hypertension  4) Hyperlipidemia  5) Vit D defiency   1) MRI revealed      Chronic microvascular ischemic changes in the white matter. No acute infarct.      Original Report Authenticated By: Camelia Phenes, M.D.   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 Would recommend changing asa to plavix.  -Followup with primary  physician  -Due to event and ischemic white matter changes, would perform carotid dopplers which may be     Done in the outpatient setting.  2) Mental status changes, intermittent  -Concerning for intermittent, yet multiple small strokes. May have some component of     Underlying confusion at home, although family admits to 1 month. Would recommend trial    of aricept.    -  Would discontinue the tagamet as this could increase the risk of hallucinations, transient LFT elevations and  irritability. In addition, due to mental status changes and gait disorder, would not recommend sleeping aid at                 home   -Labs ordered, unremarkable.  3) PT/OT/Speech Therapy   No further neurologic intervention is recommended at this time.  If further questions arise, please call or page at that time.  Thank you for allowing neurology to participate in the care of this patient.   Guy Franco [facility] Pager [phone]   08/12/2011, 9:43 AM    Patient seen and examined. I agree with the above.  Thana Farr, MD [facility] [phone]  08/17/2011  5:39 PM

## 2011-08-12 NOTE — Progress Notes (Signed)
Speech Language Pathology Treatment Patient Details Name: Glen Finley MRN: [id number] DOB: [birth year] Today's Date: 08/12/2011 Time: 8469-6295 SLP Time Calculation (min): 8 min  Assessment / Plan / Recommendation Clinical Impression  Treatment focused on discharge education for cognitive strategies.  Patient more lucid today and wife reporting "he seems back to normal."  Both wife and patient agreed that they could use some problem sovling and strategies for when he is demonstrating fluctuating cognitive skills (waxes & wanes).  Wife eager to learn how to help him help himself.  No skilled cues needed today as patient does appear more functional.  Outpatient SLP services.    SLP Plan  All goals met;Other (Comment) (Outpatient SLP)    Pertinent Vitals/Pain n/a  SLP Goals  SLP Goals SLP Goal #1 - Progress: Met SLP Goal #2 - Progress: Met  Treatment Treatment focused on: Cognition   Myra Rude, M.S.,CCC-SLP Pager [phone] 08/12/2011, 2:20 PM

## 2011-08-12 NOTE — Care Management Note (Signed)
    Page 1 of 1   08/12/2011     11:39:21 AM   CARE MANAGEMENT NOTE 08/12/2011  Patient:  Glen Finley, Glen Finley   Account Number:  [id number]  Date Initiated:  08/12/2011  Documentation initiated by:  Onnie Boer  Subjective/Objective Assessment:   PT WAS ADMITTED WITH SLURRED SPEECH AND DROOLING     Action/Plan:   PROGRESSION OF CARE AND DISCHARGE PLANNING   Anticipated DC Date:  08/12/2011   Anticipated DC Plan:  HOME/SELF CARE      DC Planning Services  CM consult      Choice offered to / List presented to:             Status of service:  Completed, signed off Medicare Important Message given?   (If response is "NO", the following Medicare IM given date fields will be blank) Date Medicare IM given:   Date Additional Medicare IM given:    Discharge Disposition:  HOME/SELF CARE  Per UR Regulation:  Reviewed for med. necessity/level of care/duration of stay  If discussed at Long Length of Stay Meetings, dates discussed:    Comments:  08/12/11 Onnie Boer, RN, BSN  1135 PT WAS ADMITTED  WITH DROOLING, SLURRED SPEECH AND SHUFFLING OF FEET.  PT IS TO DC TODAY TO HOME WITH FAMILY CARE AND WILL NEED OP PT. OP PT WILL BE ARRANGED WITH [facility]

## 2011-08-12 NOTE — Clinical Social Work Note (Signed)
CSW received consult for "outpatient assistance." CSW reviewed chart and discussed with RN. PT is recommending outpatient PT. RNCM is aware and will be following. CSW is signing off as no further social work needs identified. Please reconsult if a need arises prior to discharge.   Dede Query, MSW, Theresia Majors [phone]

## 2011-08-13 LAB — URINE CULTURE

## 2011-08-15 LAB — VITAMIN D 1,25 DIHYDROXY: Vitamin D3 1, 25 (OH)2: 40 pg/mL

## 2011-08-18 LAB — ACETYLCHOLINE RECEPTOR, BINDING: Acetylcholine Receptor Ab: 3.49 nmol/L — ABNORMAL HIGH (ref <=0.30)

## 2011-08-26 ENCOUNTER — Ambulatory Visit: Payer: Medicare Other | Admitting: Occupational Therapy

## 2011-08-26 ENCOUNTER — Ambulatory Visit: Payer: Medicare Other

## 2011-08-26 ENCOUNTER — Ambulatory Visit: Payer: Medicare Other | Attending: Internal Medicine | Admitting: Physical Therapy

## 2011-08-26 DIAGNOSIS — R5381 Other malaise: Secondary | ICD-10-CM | POA: Insufficient documentation

## 2011-08-26 DIAGNOSIS — R1310 Dysphagia, unspecified: Secondary | ICD-10-CM | POA: Insufficient documentation

## 2011-08-26 DIAGNOSIS — Z5189 Encounter for other specified aftercare: Secondary | ICD-10-CM | POA: Insufficient documentation

## 2011-08-26 DIAGNOSIS — R471 Dysarthria and anarthria: Secondary | ICD-10-CM | POA: Insufficient documentation

## 2011-08-26 DIAGNOSIS — R5383 Other fatigue: Secondary | ICD-10-CM | POA: Insufficient documentation

## 2011-09-01 ENCOUNTER — Ambulatory Visit: Payer: Medicare Other | Admitting: Physical Therapy

## 2011-09-03 ENCOUNTER — Ambulatory Visit: Payer: Medicare Other | Admitting: Physical Therapy

## 2011-09-07 ENCOUNTER — Ambulatory Visit: Payer: Medicare Other | Admitting: Physical Therapy

## 2011-09-09 ENCOUNTER — Ambulatory Visit: Payer: Medicare Other

## 2011-09-09 ENCOUNTER — Ambulatory Visit: Payer: Medicare Other | Admitting: Rehabilitative and Restorative Service Providers"

## 2011-09-14 ENCOUNTER — Ambulatory Visit: Payer: Medicare Other | Admitting: Rehabilitative and Restorative Service Providers"

## 2011-09-16 ENCOUNTER — Ambulatory Visit: Payer: Medicare Other | Admitting: Rehabilitative and Restorative Service Providers"

## 2011-09-21 ENCOUNTER — Ambulatory Visit: Payer: Medicare Other | Attending: Internal Medicine | Admitting: Rehabilitative and Restorative Service Providers"

## 2011-09-21 DIAGNOSIS — R5383 Other fatigue: Secondary | ICD-10-CM | POA: Insufficient documentation

## 2011-09-21 DIAGNOSIS — R5381 Other malaise: Secondary | ICD-10-CM | POA: Insufficient documentation

## 2011-09-21 DIAGNOSIS — R471 Dysarthria and anarthria: Secondary | ICD-10-CM | POA: Insufficient documentation

## 2011-09-21 DIAGNOSIS — Z5189 Encounter for other specified aftercare: Secondary | ICD-10-CM | POA: Insufficient documentation

## 2011-09-21 DIAGNOSIS — R1310 Dysphagia, unspecified: Secondary | ICD-10-CM | POA: Insufficient documentation

## 2011-09-23 ENCOUNTER — Ambulatory Visit: Payer: Medicare Other | Admitting: Physical Therapy

## 2011-09-25 ENCOUNTER — Other Ambulatory Visit: Payer: Self-pay | Admitting: Neurology

## 2011-09-25 DIAGNOSIS — R531 Weakness: Secondary | ICD-10-CM

## 2011-09-25 DIAGNOSIS — G7000 Myasthenia gravis without (acute) exacerbation: Secondary | ICD-10-CM

## 2011-09-25 DIAGNOSIS — F29 Unspecified psychosis not due to a substance or known physiological condition: Secondary | ICD-10-CM

## 2011-09-25 DIAGNOSIS — R269 Unspecified abnormalities of gait and mobility: Secondary | ICD-10-CM

## 2011-09-28 ENCOUNTER — Ambulatory Visit
Admission: RE | Admit: 2011-09-28 | Discharge: 2011-09-28 | Disposition: A | Discharge status: Home / Self Care | Payer: Medicare Other | Source: Ambulatory Visit | Attending: Neurology | Admitting: Neurology

## 2011-09-28 DIAGNOSIS — F29 Unspecified psychosis not due to a substance or known physiological condition: Secondary | ICD-10-CM

## 2011-09-28 DIAGNOSIS — R269 Unspecified abnormalities of gait and mobility: Secondary | ICD-10-CM

## 2011-09-28 DIAGNOSIS — G7000 Myasthenia gravis without (acute) exacerbation: Secondary | ICD-10-CM

## 2011-09-28 DIAGNOSIS — R531 Weakness: Secondary | ICD-10-CM

## 2012-01-05 ENCOUNTER — Other Ambulatory Visit: Payer: Self-pay | Admitting: Dermatology

## 2012-01-05 ENCOUNTER — Other Ambulatory Visit: Payer: Self-pay | Admitting: Internal Medicine

## 2012-01-05 DIAGNOSIS — K7460 Unspecified cirrhosis of liver: Secondary | ICD-10-CM

## 2012-01-06 ENCOUNTER — Ambulatory Visit
Admission: RE | Admit: 2012-01-06 | Discharge: 2012-01-06 | Disposition: A | Discharge status: Home / Self Care | Payer: Medicare Other | Source: Ambulatory Visit | Attending: Internal Medicine | Admitting: Internal Medicine

## 2012-01-06 DIAGNOSIS — K7460 Unspecified cirrhosis of liver: Secondary | ICD-10-CM

## 2012-07-08 ENCOUNTER — Encounter: Payer: Self-pay | Admitting: Neurology

## 2012-07-08 ENCOUNTER — Ambulatory Visit (INDEPENDENT_AMBULATORY_CARE_PROVIDER_SITE_OTHER): Payer: Medicare Other | Admitting: Neurology

## 2012-07-08 VITALS — BP 132/70 | HR 61 | Ht 69.0 in | Wt 179.0 lb

## 2012-07-08 DIAGNOSIS — R519 Headache, unspecified: Secondary | ICD-10-CM | POA: Insufficient documentation

## 2012-07-08 DIAGNOSIS — G7000 Myasthenia gravis without (acute) exacerbation: Secondary | ICD-10-CM

## 2012-07-08 DIAGNOSIS — R5381 Other malaise: Secondary | ICD-10-CM

## 2012-07-08 DIAGNOSIS — F29 Unspecified psychosis not due to a substance or known physiological condition: Secondary | ICD-10-CM

## 2012-07-08 DIAGNOSIS — R531 Weakness: Secondary | ICD-10-CM

## 2012-07-08 DIAGNOSIS — G2589 Other specified extrapyramidal and movement disorders: Secondary | ICD-10-CM

## 2012-07-08 DIAGNOSIS — K219 Gastro-esophageal reflux disease without esophagitis: Secondary | ICD-10-CM

## 2012-07-08 DIAGNOSIS — R269 Unspecified abnormalities of gait and mobility: Secondary | ICD-10-CM

## 2012-07-08 DIAGNOSIS — G4752 REM sleep behavior disorder: Secondary | ICD-10-CM

## 2012-07-08 DIAGNOSIS — R51 Headache: Secondary | ICD-10-CM

## 2012-07-08 DIAGNOSIS — E669 Obesity, unspecified: Secondary | ICD-10-CM

## 2012-07-08 MED ORDER — PREDNISONE 5 MG PO TABS: 5.0000 mg | ORAL_TABLET | Freq: Every day | ORAL | Status: DC

## 2012-07-08 NOTE — Progress Notes (Signed)
HPI: [name] is a 78 years old right-handed effort Caucasian male, accompanied by his wife, following up for most recent hospital admission in 08/12/2011  He has past medical history of hypertension, hyperlipidemia, wife reported 2 weeks history of gradual onset symptoms prior to admission in May 20.  He was noticed to have excessive fatigue, changed walking posture, stoop forward, shuffling, confusion occasionally, slurred speech,  difficulty buttoning his shirt, could not fix his breakfast, by the day of admission, the symptoms was more obvious,  normal TSH, vitamin B12, CBC CMP, negative RPR, MRI of the brain showed small vessel disease, patient denies significant memory loss, he is a retired Hydrologist.  The only abnormality was AChR antibody, with titer of 3.4, he denies double vision, repeatt Achr Ab was positive for binding and blocking antibodies. CPK was normal. C  reactive protein 1.8 was normal.    He complains of excessive fatigue, sleepiness during the daytime, loud snoring at night, occasionally catching his breath,  weight gain over the past few years, He had sleep study, which reveals periodic limb movement of sleep,improved with low-dose amitriptyline treatment   He continues to complains of excessive fatigue, generalized weakness, shortness of breath with minimum exertion. Recent abnormal LFTs and abnormal US liver. I started him on prednisone since 01/08/2012.   He feels much better, he was able to walk 1 mile on treadmill  without much difficulty, prednisone 20 mg every day was started since October 18th, he was also evaluated by his GI specialist Dr. Sharrell Ku, per report, there was  elevated venous ammonia level during his hospital stay, accompanied by confusion, profound motor retardation, which was responsive to lactulose, ultrasound showed increased hepatic echogenicity, and irregular liver margin, portal vein flow could not be demonstrated, he appeared to  have cirrhosis, with evidence of end-stage liver disease, with hepatic encephalopathy, this may be an explanation for his decompensation during his hospital in May 2013, but I did not see his ammonia level in the record, MRI of liver to evaluate possible portal vein thrombosis was denied by his insurance company.   He had a history of hyperlipidemia, has been on pravastatin 80 mg a day for 10 years, reported a previous history of abnormal liver function, rash, joint pain, to other statin treatment. pravastatin was stopped since his visit with Dr. Kinnie Scales October 25th 2013  He was doing well, walk on threadmill everyday, no diplopia, is taking prednisone 15mg  qday, mestinon 60mg  tid did not make any difference.  Since the prednisone, he is much better, he denies double vision, no gait difficulty, no lower extremity weakness  UPDATE April 18th 2014:  He is doing well, he is still taking prednisone 10mg  qday, no longer taking mestinon. He is going to have cataract surgery during summer, his liver function has come back normal as well   Physical Exam  Head: normocephlaic  Ears, Nose and Throat: malllomapti 3  Neck: supple no carotid bruits Respiratory: clear to auscultation - no wheezing , non  rhonci -bilaterally Cardiovascular: regular rate rhythm Skin: v   Neurologic Exam  Mental Status: pleasant looking awake, alert, cooperative to history, talking, and casual conversation.  soft voice clears his throat frequently , Cranial Nerves: CN II-XII pupils were equal round reactive to light.   Extraocular movements were full.  Visual fields were full on confrontational test.  Facial sensation and strength were normal. static mild bilatera ptosis.- has heavy eyelids.  Hearing was intact to finger rubbing bilaterally.  Uvula tongue were midline.  Head turning and shoulder shrugging were normal and symmetric.   Tongue protrusion into the cheeks strength were normal.  Narrow oropharyngeal pathway . Motor:  normal tone, bulk and strength, no hip flexion weakness. Sensory: Normal to light touch,  Coordination: Normal finger-to-nose, heel-to-shin.  There was no dysmetria noticed. Gait and Station: able to get up from low seated position arm crossed, no gait difficulty mildly stooped forward posture,. Reflexes: Deep tendon reflexes: present and symmetric. Plantar responses are flexor.   Assessment and Plan:  78 years old Caucasian male with positive AchR Antibody ,doing better with prednisone, also stopping of provastatin. Myasthenia gravis remamins the  possible diagnosis, especially that he responed to steroid treatment  1. decrease prednisone to 5mg  qday1 month, and then stop  2. RTC in 3 months 3. Repeat Achr antibody test on next visit.

## 2012-09-26 ENCOUNTER — Ambulatory Visit (INDEPENDENT_AMBULATORY_CARE_PROVIDER_SITE_OTHER): Payer: Medicare Other | Admitting: Surgery

## 2012-09-27 ENCOUNTER — Encounter (INDEPENDENT_AMBULATORY_CARE_PROVIDER_SITE_OTHER): Payer: Self-pay | Admitting: General Surgery

## 2012-09-27 ENCOUNTER — Ambulatory Visit (INDEPENDENT_AMBULATORY_CARE_PROVIDER_SITE_OTHER): Payer: Medicare Other | Admitting: General Surgery

## 2012-09-27 VITALS — BP 128/70 | HR 68 | Temp 97.7°F | Resp 15 | Ht 69.0 in | Wt 177.0 lb

## 2012-09-27 DIAGNOSIS — K645 Perianal venous thrombosis: Secondary | ICD-10-CM

## 2012-09-27 MED ORDER — HYDROCORTISONE 2.5 % RE CREA: TOPICAL_CREAM | Freq: Two times a day (BID) | RECTAL | Status: DC

## 2012-09-27 NOTE — Patient Instructions (Addendum)
HEMORRHOIDS    Did you know... Hemorrhoids are one of the most common ailments known.  More than half the population will develop hemorrhoids, usually after age 76.  Millions of Americans currently suffer from hemorrhoids.  The average person suffers in silence for a long period before seeking medical care.  Today's treatment methods make some types of hemorrhoid removal much less painful.  What are hemorrhoids? Often described as "varicose veins of the anus and rectum", hemorrhoids are enlarged, bulging blood vessels in and about the anus and lower rectum. There are two types of hemorrhoids: external and internal, which refer to their location.  External (outside) hemorrhoids develop near the anus and are covered by very sensitive skin. These are usually painless. However, if a blood clot (thrombosis) develops in an external hemorrhoid, it becomes a painful, hard lump. The external hemorrhoid may bleed if it ruptures. Internal (inside) hemorrhoids develop within the anus beneath the lining. Painless bleeding and protrusion during bowel movements are the most common symptom. However, an internal hemorrhoid can cause severe pain if it is completely "prolapsed" - protrudes from the anal opening and cannot be pushed back inside.   What causes hemorrhoids? An exact cause is unknown; however, the upright posture of humans alone forces a great deal of pressure on the rectal veins, which sometimes causes them to bulge. Other contributing factors include:  . Aging  . Chronic constipation or diarrhea  . Pregnancy  . Heredity  . Straining during bowel movements  . Faulty bowel function due to overuse of laxatives or enemas . Spending long periods of time (e.g., reading) on the toilet  Whatever the cause, the tissues supporting the vessels stretch. As a result, the vessels dilate; their walls become thin and bleed. If the stretching and pressure continue, the weakened vessels protrude.  What are the  symptoms? If you notice any of the following, you could have hemorrhoids:  . Bleeding during bowel movements  . Protrusion during bowel movements . Itching in the anal area  . Pain  . Sensitive lump(s)  How are external hemorrhoids treated? Mild symptoms can be relieved frequently by increasing the amount of fiber (e.g., fruits, vegetables, breads and cereals) and fluids in the diet. Eliminating excessive straining reduces the pressure on hemorrhoids and helps prevent them from protruding. A sitz bath - sitting in plain warm water for about 10 minutes - can also provide some relief . With these measures, the pain and swelling of most symptomatic hemorrhoids will decrease in two to seven days, and the firm lump should recede within four to six weeks. In cases of severe or persistent pain from a thrombosed hemorrhoid, your physician may elect to remove the hemorrhoid containing the clot with a small incision. Performed under local anesthesia as an outpatient, this procedure generally provides relief. Severe hemorrhoids may require special treatment, much of which can be performed on an outpatient basis.

## 2012-09-27 NOTE — Progress Notes (Signed)
Chief Complaint  Patient presents with  . New Evaluation    eval anal skin tag/ext hems    HISTORY: Glen Finley is a 78 y.o. male who presents to the office with perianal bleeding.  Other symptoms include pain the week prior.  This had been occurring for 4 wks.  He has tried hemorrhoid cream in the past with some success.  Nothing makes the symptoms worse.  He has never had this before.  It is intermittent in nature.  His bowel habits are regular and his bowel movements are usually soft.  His fiber intake is minimal.  He takes miralax occasionally for constipation.  His last colonoscopy was about a year ago.  Dr Kinnie Scales removed a couple polyps per pt report.     Past Medical History  Diagnosis Date  . Hypertension   . Headache(784.0)   . GERD (gastroesophageal reflux disease)   . Hyperlipidemia   . Obesity   . Migraine   . REM sleep behavior disorder   . Other extrapyramidal disease and abnormal movement disorder   . Myasthenia gravis without exacerbation   . Abnormality of gait   . Unspecified psychosis   . Weakness       Past Surgical History  Procedure Laterality Date  . No past surgeries          Current Outpatient Prescriptions  Medication Sig Dispense Refill  . amitriptyline (ELAVIL) 50 MG tablet 25 mg daily. 1/2 tab for 25mg  PO HS prn sleep.  If we can - we may Discontinue this medicine.      Marland Kitchen aspirin EC 81 MG tablet Take 81 mg by mouth at bedtime.      Marland Kitchen atenolol (TENORMIN) 25 MG tablet 25 mg daily.      . fish oil-omega-3 fatty acids 1000 MG capsule Take 1 g by mouth 2 (two) times daily.      Marland Kitchen lisinopril (PRINIVIL,ZESTRIL) 40 MG tablet Take 20 mg by mouth daily.       . Multiple Vitamins-Minerals (MULTIVITAMINS THER. W/MINERALS) TABS Take 1 tablet by mouth daily.      Marland Kitchen VITAMIN D, CHOLECALCIFEROL, PO Take 1 tablet by mouth daily.      . hydrocortisone (PROCTOZONE-HC) 2.5 % rectal cream Place rectally 2 (two) times daily.  30 g  0   No current facility-administered  medications for this visit.      Allergies  Allergen Reactions  . Codeine Rash      Family History  Problem Relation Age of Onset  .       History   Social History  . Marital Status: Married    Spouse Name: N/A    Number of Children: N/A  . Years of Education: N/A   Social History Main Topics  . Smoking status: Never Smoker   . Smokeless tobacco: None  . Alcohol Use: No  . Drug Use: No  . Sexually Active: No   Other Topics Concern  . None   Social History Narrative  . None      REVIEW OF SYSTEMS - PERTINENT POSITIVES ONLY: Review of Systems - General ROS: negative for - chills, fever or weight loss Hematological and Lymphatic ROS: negative for - bleeding problems, blood clots or bruising Respiratory ROS: no cough, shortness of breath, or wheezing Cardiovascular ROS: no chest pain or dyspnea on exertion Gastrointestinal ROS: no abdominal pain, change in bowel habits, or black or bloody stools Genito-Urinary ROS: no dysuria, trouble voiding, or hematuria  EXAM:  Filed Vitals:   09/27/12 0930  BP: 128/70  Pulse: 68  Temp: 97.7 F (36.5 C)  Resp: 15    General appearance: alert and cooperative Resp: clear to auscultation bilaterally Cardio: regular rate and rhythm GI: soft, non-tender; bowel sounds normal; no masses,  no organomegaly   Procedure: Anoscopy Surgeon: Maisie Fus Diagnosis: anal pain   Assistant: Christella Scheuermann After the risks and benefits were explained, verbal consent was obtained for above procedure  Anesthesia: none Findings: resolving thrombosed hemorrhoid, mildly inflamed grade 2 internal hemorrhoids    ASSESSMENT AND PLAN: Glen Finley is a 78 y.o. M who developed a perianal mass and pain about 4 weeks ago.  On exam it appears he had a thrombosed external hemorrhoid.  This appears to be resolving.  I do feel that surgical excision would help him at this time, given the pain is resolving and this is his first episode.  I have asked him to  increase his fluid and fiber intake.  I have prescribed proctozone for his hemorrhoids and encouraged him to do sitz baths after BM's and for comfort.  He will return the office if his pain persists or he develops new symptoms.    Vanita Panda, MD Colon and Rectal Surgery / General Surgery [facility]      Visit Diagnoses: 1. Thrombosed external hemorrhoid     Primary Care Physician: Gwen Pounds, MD

## 2012-10-12 ENCOUNTER — Ambulatory Visit (INDEPENDENT_AMBULATORY_CARE_PROVIDER_SITE_OTHER): Payer: Medicare Other | Admitting: Neurology

## 2012-10-12 ENCOUNTER — Encounter: Payer: Self-pay | Admitting: Neurology

## 2012-10-12 ENCOUNTER — Other Ambulatory Visit: Payer: Self-pay | Admitting: Neurology

## 2012-10-12 VITALS — BP 138/64 | HR 60 | Ht 69.0 in | Wt 179.0 lb

## 2012-10-12 DIAGNOSIS — G7000 Myasthenia gravis without (acute) exacerbation: Secondary | ICD-10-CM

## 2012-10-12 NOTE — Progress Notes (Signed)
HPI:   Glen Finley is a 78 years old right-handed effort Caucasian male, accompanied by his wife, following up for possible myasthenia gravis.  He had a hospital admission in 08/12/2011  He has past medical history of hypertension, hyperlipidemia, wife reported 2 weeks history of gradual onset symptoms prior to admission in Aug 09, 2012  He was noticed to have excessive fatigue, changed walking posture, stoop forward, shuffling, confusion occasionally, slurred speech,  difficulty buttoning his shirt, could not fix his breakfast, by the day of admission, the symptoms was more obvious,  normal TSH, vitamin B12, CBC CMP, negative RPR, MRI of the brain showed small vessel disease, patient denies significant memory loss, he is a retired Hydrologist.  The only abnormality was AChR antibody, with titer of 3.4, he denies double vision, repeatt Achr Ab was positive for binding and blocking antibodies. CPK was normal. C  reactive protein 1.8 was normal.    He complains of excessive fatigue, sleepiness during the daytime, loud snoring at night, occasionally catching his breath,  weight gain over the past few years, He had sleep study, which reveals periodic limb movement of sleep,improved with low-dose amitriptyline treatment  He continues to complains of excessive fatigue, generalized weakness, shortness of breath with minimum exertion. Recent abnormal LFTs and abnormal US liver. I started him on prednisone since 01/08/2012.  He feels much better now, he was able to walk 1 mile on treadmill  without much difficulty, prednisone 20 mg every day was started since October 18th, he was also evaluated by his GI specialist Dr. Sharrell Ku, per report, there was  elevated venous ammonia level during his hospital stay, accompanied by confusion, profound motor retardation, which was responsive to lactulose, ultrasound showed increased hepatic echogenicity, and irregular liver margin, portal vein flow  could not be demonstrated, he appeared to have cirrhosis, with evidence of end-stage liver disease, with hepatic encephalopathy, this may be an explanation for his decompensation during his hospital in May 2013, but I did not see his ammonia level in the record, MRI of liver to evaluate possible portal vein thrombosis was denied by his insurance company.   He had a history of hyperlipidemia, has been on pravastatin 80 mg a day for 10 years, reported a previous history of abnormal liver function, rash, joint pain, to other statin treatment. pravastatin was stopped since his visit with Dr. Kinnie Scales October 25th 2013  He was doing well, walk on threadmill everyday, no diplopia, is taking prednisone 15mg  qday, mestinon 60mg  tid did not make any difference.  Since the prednisone, he is much better, he denies double vision, no gait difficulty, no lower extremity weakness  UPDATE April 18th 2014:  He is doing well, he is still taking prednisone 10mg  qday, no longer taking mestinon. He is going to have cataract surgery during summer, his liver function has come back normal as well   He has stopped prednisone since May 2014, doing well, did not notice any weakness, he denies dysarthria, swallowing difficulty, no double vision, no weakness.  UPDATE October 12, 2012: He was tapered off prednisone for 3-4 weeks now, there was no significant side effect, or recurrent symptoms noticed.  He denies recurrent weakness, over all doing well  Physical Exam  Head: normocephlaic  Ears, Nose and Throat: malllomapti 3  Neck: supple no carotid bruits Respiratory: clear to auscultation  Cardiovascular: regular rate rhythm Skin: normal   Neurologic Exam  Mental Status: pleasant looking awake, alert, cooperative to history, talking, and  casual conversation.  soft voice clears his throat frequently , Cranial Nerves: CN II-XII pupils were equal round reactive to light.   Extraocular movements were full.  Visual fields were  full on confrontational test.  Facial sensation and strength were normal. static mild bilatera ptosis.- has heavy eyelids.  Hearing was intact to finger rubbing bilaterally.  Uvula tongue were midline.  Head turning and shoulder shrugging were normal and symmetric. Tongue protrusion into the cheeks strength were normal.  Narrow oropharyngeal pathway . Motor: normal tone, bulk and strength, no hip flexion weakness. Sensory: Normal to light touch,  Coordination: Normal finger-to-nose, heel-to-shin.  There was no dysmetria noticed. Gait and Station: able to get up from low seated position arm crossed, no gait difficulty mildly stooped forward posture,. Reflexes: Deep tendon reflexes: present and symmetric. Plantar responses are flexor.   Assessment and Plan: 78 years old Caucasian male with positive AchR antibody , doing better with prednisone, also stopping of provastatin. myasthenia gravis remamins the  possible diagnosis, doing well with out prednisone  1.  Lab evaluation  2.  RTC as needed.

## 2012-10-17 LAB — THYROID PANEL WITH TSH: T3 Uptake Ratio: 28 % (ref 24–39)

## 2012-10-17 LAB — ACETYLCHOLINE RECEPTOR, MODULATING: Acetylcholine Rec Mod Ab: <12 % (ref 0–20)

## 2012-10-18 NOTE — Progress Notes (Signed)
Quick Note:  Please call patient, blood test continue to show elevated AchR antibody, suggestive of myasthenia gravis, Please also give him a follow up appt in 6 months ______

## 2012-10-20 NOTE — Progress Notes (Signed)
Quick Note:  Spoke with patient and relayed results of blood work. The patient was also given a f/u appointment date and time of 03/10/2013 @ 2:30 PM. Patient understood and had no questions.  ______

## 2012-10-26 ENCOUNTER — Other Ambulatory Visit: Payer: Self-pay

## 2013-01-06 ENCOUNTER — Other Ambulatory Visit: Payer: Self-pay | Admitting: Internal Medicine

## 2013-01-06 DIAGNOSIS — K7689 Other specified diseases of liver: Secondary | ICD-10-CM

## 2013-01-06 DIAGNOSIS — K7460 Unspecified cirrhosis of liver: Secondary | ICD-10-CM

## 2013-01-13 ENCOUNTER — Ambulatory Visit
Admission: RE | Admit: 2013-01-13 | Discharge: 2013-01-13 | Disposition: A | Discharge status: Home / Self Care | Payer: Medicare Other | Source: Ambulatory Visit | Attending: Internal Medicine | Admitting: Internal Medicine

## 2013-01-13 DIAGNOSIS — K7689 Other specified diseases of liver: Secondary | ICD-10-CM

## 2013-01-13 DIAGNOSIS — K7460 Unspecified cirrhosis of liver: Secondary | ICD-10-CM

## 2013-01-26 ENCOUNTER — Other Ambulatory Visit: Payer: Self-pay

## 2013-03-10 ENCOUNTER — Encounter: Payer: Self-pay | Admitting: Neurology

## 2013-03-10 ENCOUNTER — Ambulatory Visit (INDEPENDENT_AMBULATORY_CARE_PROVIDER_SITE_OTHER): Payer: Medicare Other | Admitting: Neurology

## 2013-03-10 VITALS — BP 132/64 | HR 66 | Ht 69.0 in | Wt 185.0 lb

## 2013-03-10 DIAGNOSIS — G7000 Myasthenia gravis without (acute) exacerbation: Secondary | ICD-10-CM

## 2013-03-10 NOTE — Progress Notes (Signed)
HPI:   Glen Finley is a 78 years old right-handed effort Caucasian male, accompanied by his wife, following up for possible myasthenia gravis.  He had a hospital admission in 08/12/2011  He has past medical history of hypertension, hyperlipidemia, wife reported 2 weeks history of gradual onset symptoms prior to admission in Aug 09, 2012  He was noticed to have excessive fatigue, changed walking posture, stoop forward, shuffling, confusion occasionally, slurred speech,  difficulty buttoning his shirt, could not fix his breakfast, by the day of admission, the symptoms was more obvious,  normal TSH, vitamin B12, CBC CMP, negative RPR, MRI of the brain showed small vessel disease, patient denies significant memory loss, he is a retired Hydrologist.  The only abnormality was AChR antibody, with titer of 3.4, he denies double vision, repeatt Achr Ab was positive for binding and blocking antibodies. CPK was normal. C  reactive protein 1.8 was normal.    He complains of excessive fatigue, sleepiness during the daytime, loud snoring at night, occasionally catching his breath,  weight gain over the past few years, He had sleep study, which reveals periodic limb movement of sleep,improved with low-dose amitriptyline treatment  He continues to complains of excessive fatigue, generalized weakness, shortness of breath with minimum exertion. Recent abnormal LFTs and abnormal US liver. I started him on prednisone since 01/08/2012.  He feels much better now, he was able to walk 1 mile on treadmill  without much difficulty, prednisone 20 mg every day was started since October 18th, he was also evaluated by his GI specialist Dr. Sharrell Ku, per report, there was  elevated venous ammonia level during his hospital stay, accompanied by confusion, profound motor retardation, which was responsive to lactulose, ultrasound showed increased hepatic echogenicity, and irregular liver margin, portal vein flow  could not be demonstrated, he appeared to have cirrhosis, with evidence of end-stage liver disease, with hepatic encephalopathy, this may be an explanation for his decompensation during his hospital in May 2013, but I did not see his ammonia level in the record, MRI of liver to evaluate possible portal vein thrombosis was denied by his insurance company.   He had a history of hyperlipidemia, has been on pravastatin 80 mg a day for 10 years, reported a previous history of abnormal liver function, rash, joint pain, to other statin treatment. pravastatin was stopped since his visit with Dr. Kinnie Scales October 25th 2013  He was doing well, walk on threadmill everyday, no diplopia, is taking prednisone 15mg  qday, mestinon 60mg  tid did not make any difference.  Since the prednisone, he is much better, he denies double vision, no gait difficulty, no lower extremity weakness  UPDATE April 18th 2014:  He is doing well, he is still taking prednisone 10mg  qday, no longer taking mestinon. He is going to have cataract surgery during summer, his liver function has come back normal as well   He has stopped prednisone since May 2014, doing well, did not notice any weakness, he denies dysarthria, swallowing difficulty, no double vision, no weakness.  UPDATE October 12, 2012: He was tapered off prednisone for 3-4 weeks now, there was no significant side effect, or recurrent symptoms noticed.  He denies recurrent weakness, over all doing well  UPDATE 03/10/2013; He is active, raking leaves, he works at his [employer], he reads books about World War [employer], he denies difficulty chewing, swallowing, sleeping,  He was seen by his primary care physician Dr. Timothy Lasso, normal FLPs, and mild abnormal liver function test., raking leaves, he works at his [employer], he reads books about [employer] War [employer], he denies difficulty chewing, swallowing, sleeping,  He was seen by his primary care physician Dr. Timothy Lasso, normal FLPs, and mild abnormal liver function test. Repeat  laboratory evaluation showed positive acetylcholine binding antibody with titer of 3.4  Physical Exam  Head: normocephlaic  Ears, Nose and Throat: Normal  Neck: supple no carotid bruits Respiratory: clear to  auscultation  Cardiovascular: regular rate rhythm Skin: normal   Neurologic Exam  Mental Status: pleasant looking awake, alert, cooperative to history, talking, and casual conversation.  soft voice clears his throat frequently , Cranial Nerves: CN II-XII pupils were equal round reactive to light.   Extraocular movements were full.  Visual fields were full on confrontational test.  Facial sensation and strength were normal. static mild bilatera ptosis.- has heavy eyelids.  Hearing was intact to finger rubbing bilaterally.  Uvula tongue were midline.  Head turning and shoulder shrugging were normal and symmetric. Tongue protrusion into the cheeks strength were normal.  Narrow oropharyngeal pathway . Motor: normal tone, bulk and strength, no hip flexion weakness. Sensory: Normal to light touch,  Coordination: Normal finger-to-nose, heel-to-shin.  There was no dysmetria noticed. Gait and Station: able to get up from low seated position arm crossed, no gait difficulty mildly stooped forward posture,. Reflexes: Deep tendon reflexes: present and symmetric. Plantar responses are flexor.   Assessment and Plan: 78 years old Caucasian male with positive AchR antibody , doing better with prednisone, also stopping of provastatin. Myasthenia gravis, doing very well without any notable suppressive treatment  RTC in one year

## 2014-01-25 ENCOUNTER — Encounter: Payer: Self-pay | Admitting: *Deleted

## 2014-03-08 ENCOUNTER — Ambulatory Visit (INDEPENDENT_AMBULATORY_CARE_PROVIDER_SITE_OTHER): Payer: Medicare Other | Admitting: Neurology

## 2014-03-08 ENCOUNTER — Encounter: Payer: Self-pay | Admitting: Neurology

## 2014-03-08 VITALS — BP 136/64 | HR 61 | Ht 69.0 in | Wt 187.0 lb

## 2014-03-08 DIAGNOSIS — G7000 Myasthenia gravis without (acute) exacerbation: Secondary | ICD-10-CM

## 2014-03-08 NOTE — Progress Notes (Signed)
HPI:   Mr. Carducci is a 79 years old right-handed effort Caucasian male, accompanied by his wife, following up for possible myasthenia gravis.  He has past medical history of hypertension, hyperlipidemia, wife reported 2 weeks history of gradual onset symptoms prior to admission in Aug 09, 2012  He was noticed to have excessive fatigue, changed walking posture, stoop forward, shuffling, confusion occasionally, slurred speech,  difficulty buttoning his shirt, could not fix his breakfast, by the day of admission, the symptoms was more obvious,  normal TSH, vitamin B12, CBC CMP, negative RPR, MRI of the brain showed small vessel disease, patient denies significant memory loss, he is a retired [employer] engineer.  The only abnormality was AChR antibody, with titer of 3.4, he denies double vision, repeatt Achr Ab was positive for binding and blocking antibodies. CPK was normal. C  reactive protein 1.8 was normal.    He complains of excessive fatigue, sleepiness during the daytime, loud snoring at night, occasionally catching his breath,  weight gain over the past few years, He had sleep study, which reveals periodic limb movement of sleep,improved with low-dose amitriptyline treatment  He continues to complains of excessive fatigue, generalized weakness, shortness of breath with minimum exertion. Recent abnormal LFTs and abnormal US liver. I started him on prednisone since 01/08/2012.  He feels much better now, he was able to walk 1 mile on treadmill  without much difficulty, prednisone 20 mg every day was started since October 18th 2013, he was also evaluated by his GI specialist Dr. Richmond Campbell, per report, there was  elevated venous ammonia level during his hospital stay, accompanied by confusion, profound motor retardation, which was responsive to lactulose, ultrasound showed increased hepatic echogenicity, and irregular liver margin, portal vein flow could not be demonstrated, he appeared to  have cirrhosis, with evidence of end-stage liver disease, with hepatic encephalopathy, this may be an explanation for his decompensation during his hospital in May 2013, but I did not see his ammonia level in the record, MRI of liver to evaluate possible portal vein thrombosis was denied by his insurance company.   He had a history of hyperlipidemia, has been on pravastatin 80 mg a day for 10 years, reported a previous history of abnormal liver function, rash, joint pain, to other statin treatment. pravastatin was stopped since his visit with Dr. Earlean Shawl October 25th 2013  He was doing well, walk on threadmill everyday, no diplopia, is taking prednisone 15mg  qday, mestinon 60mg  tid did not make any difference.  Since the prednisone, he is much better, he denies double vision, no gait difficulty, no lower extremity weakness  UPDATE April 18th 2014:  He is doing well, he is still taking prednisone 10mg  qday, no longer taking mestinon. He is going to have cataract surgery during summer, his liver function has come back normal as well   He has stopped prednisone since May 2014, doing well, did not notice any weakness, he denies dysarthria, swallowing difficulty, no double vision, no weakness.  UPDATE October 12, 2012: He was tapered off prednisone since June 2014, there was no significant side effect, or recurrent symptoms noticed.  He denies recurrent weakness, over all doing well  UPDATE 03/10/2013; He is active, raking leaves, he works at his [employer], he reads books about World War II, he denies difficulty chewing, swallowing, sleeping,  He was seen by his primary care physician Dr. Virgina Jock, normal FLPs, and mild abnormal liver function test. Repeat laboratory evaluation showed positive acetylcholine binding antibody with  titer of 3.4  UPDATE Dec 17th 2015: He is doing very well, no longer taking prednisone, not taking Mestinon, taking Elavil 50 mg every night for sleep, also Lexapro for mild  depression, which has been very helpful Reported previously mild abnormal sleep study, but does not need CPAP machine, he continue complains of mild to moderate daytime fatigue, FSS score is 32 today, mild daytime sleepiness, improved snoring at nighttime    Physical Exam  Head: normocephlaic  Ears, Nose and Throat: Normal  Neck: supple no carotid bruits Respiratory: clear to auscultation  Cardiovascular: regular rate rhythm Skin: normal   Neurologic Exam  Mental Status: pleasant looking awake, alert, cooperative to history, talking, and casual conversation.  soft voice clears his throat frequently , Cranial Nerves: CN II-XII pupils were equal round reactive to light.   Extraocular movements were full.  Visual fields were full on confrontational test.  Facial sensation and strength were normal. static mild bilatera ptosis.- has heavy eyelids.  Hearing was intact to finger rubbing bilaterally.  Uvula tongue were midline.  Head turning and shoulder shrugging were normal and symmetric. Tongue protrusion into the cheeks strength were normal.  Narrow oropharyngeal pathway . Motor: normal tone, bulk and strength, no hip flexion weakness. Sensory: Normal to light touch,  Coordination: Normal finger-to-nose, heel-to-shin.  There was no dysmetria noticed. Gait and Station: able to get up from low seated position arm crossed, no gait difficulty mildly stooped forward posture,. Reflexes: Deep tendon reflexes: present and symmetric. Plantar responses are flexor.  Assessment and Plan: 79 years old Caucasian male with positive AchR antibody , doing better with prednisone, also stopping of provastatin. Myasthenia gravis, doing very well without any notable suppressive treatment  RTC in one year.  Marcial Pacas, M.D. Ph.D.  [facility] [place], [place] [address] Phone: [phone] Fax:      [phone]

## 2014-06-26 ENCOUNTER — Other Ambulatory Visit: Payer: Self-pay | Admitting: Internal Medicine

## 2014-06-26 DIAGNOSIS — R319 Hematuria, unspecified: Secondary | ICD-10-CM

## 2014-06-27 ENCOUNTER — Ambulatory Visit
Admission: RE | Admit: 2014-06-27 | Discharge: 2014-06-27 | Disposition: A | Discharge status: Home / Self Care | Payer: Self-pay | Source: Ambulatory Visit | Attending: Internal Medicine | Admitting: Internal Medicine

## 2014-06-27 DIAGNOSIS — R319 Hematuria, unspecified: Secondary | ICD-10-CM

## 2015-01-03 ENCOUNTER — Other Ambulatory Visit: Payer: Self-pay | Admitting: Dermatology

## 2015-03-07 ENCOUNTER — Encounter: Payer: Self-pay | Admitting: Neurology

## 2015-03-07 ENCOUNTER — Ambulatory Visit (INDEPENDENT_AMBULATORY_CARE_PROVIDER_SITE_OTHER): Payer: Medicare Other | Admitting: Neurology

## 2015-03-07 VITALS — BP 160/72 | HR 68 | Ht 69.0 in | Wt 195.0 lb

## 2015-03-07 DIAGNOSIS — G4730 Sleep apnea, unspecified: Secondary | ICD-10-CM | POA: Insufficient documentation

## 2015-03-07 DIAGNOSIS — G7000 Myasthenia gravis without (acute) exacerbation: Secondary | ICD-10-CM | POA: Diagnosis not present

## 2015-03-07 NOTE — Progress Notes (Signed)
PATIENT: Glen Finley DOB: [birth year]  Chief Complaint  Patient presents with  . Myasthenia Gravis    He is here with his wife, Glen Finley.  Reports doing well overall with the exception a slightly decreased energy level.     HISTORICAL  Glen Finley  Is a 80 yo right-handed effort Caucasian male, accompanied by his wife, following up for possible myasthenia gravis.  He has past medical history of hypertension, hyperlipidemia, wife reported 2 weeks history of gradual onset symptoms prior to admission in Aug 09, 2012  He was noticed to have excessive fatigue, changed walking posture, stoop forward, shuffling, confusion occasionally, slurred speech,  difficulty buttoning his shirt, could not fix his breakfast, by the day of admission, the symptoms was more obvious,  normal TSH, vitamin B12, CBC CMP, negative RPR, MRI of the brain showed small vessel disease, patient denies significant memory loss, he is a retired Network engineer.  The only abnormality was AChR antibody, with titer of 3.4, he denies double vision, repeatt Achr Ab was positive for binding and blocking antibodies. CPK was normal. C  reactive protein 1.8 was normal.    He complains of excessive fatigue, sleepiness during the daytime, loud snoring at night, occasionally catching his breath,  weight gain over the past few years, He had sleep study, which reveals periodic limb movement of sleep,improved with low-dose amitriptyline treatment  He continues to complains of excessive fatigue, generalized weakness, shortness of breath with minimum exertion. Recent abnormal LFTs and abnormal US liver. I started him on prednisone since 01/08/2012.  He feels much better since prednisone treatment in October 2013, started from prednisone 20 mg daily, he was able to walk 1 mile on treadmill  without much difficulty, he was also evaluated by his GI specialist Dr. Richmond Campbell, per report, there was  elevated venous ammonia  level during his hospital stay, accompanied by confusion, profound motor slowing, which was responsive to lactulose, ultrasound showed increased hepatic echogenicity, and irregular liver margin, portal vein flow could not be demonstrated, he appeared to have cirrhosis, with evidence of end-stage liver disease, with hepatic encephalopathy, this may be an explanation for his decompensation during his hospital in May 2013, but I did not see his ammonia level in the record, MRI of liver to evaluate possible portal vein thrombosis was denied by his insurance company.   He had a history of hyperlipidemia, has been on pravastatin 80 mg a day for 10 years, reported a previous history of abnormal liver function, rash, joint pain, to other statin treatment. pravastatin was stopped since his visit with Dr. Earlean Shawl October 25th 2013  He was doing well, walk on threadmill everyday, no diplopia, he is taking prednisone '15mg'$  qday, mestinon '60mg'$  tid did not make any difference.  Since the prednisone, he is much better, he denies double vision, no gait difficulty, no lower extremity weakness  UPDATE April 18th 2014:  He is doing well, he is still taking prednisone '10mg'$  qday, no longer taking mestinon. He is going to have cataract surgery during summer, his liver function has come back normal as well   He has stopped prednisone since May 2014, doing well, did not notice any weakness, he denies dysarthria, swallowing difficulty, no double vision, no weakness.  UPDATE October 12, 2012: He was tapered off prednisone since June 2014, there was no significant side effect, or recurrent symptoms noticed.  He denies recurrent weakness, over all doing well  UPDATE 03/10/2013; He is active, raking  leaves, he works at his [employer], he reads books about World War II, he denies difficulty chewing, swallowing, sleeping,  He was seen by his primary care physician Dr. Virgina Jock, normal FLPs, and mild abnormal liver function test. Repeat  laboratory evaluation showed positive acetylcholine binding antibody with titer of 3.4  UPDATE Dec 17th 2015: He is doing very well, no longer taking prednisone, not taking Mestinon, taking Elavil 50 mg every night for sleep, also Lexapro for mild depression, which has been very helpful Reported previously mild abnormal sleep study, but does not need CPAP machine, he continue complains of mild to moderate daytime fatigue, FSS score is 32 today, mild daytime sleepiness, improved snoring at nighttime  Update March 07 2015: His overall doing well, no longer taking steroids, he has not needed Mestinon, he denies double vision, no gait difficulty, still complaining occasionally fatigue, he time sleepiness, take naps  He enjoys building furniture pieces for his family  REVIEW OF SYSTEMS: Full 14 system review of systems performed and notable only for  ringing ears, bruise easily  ALLERGIES: Allergies  Allergen Reactions  . Codeine Rash    HOME MEDICATIONS: Current Outpatient Prescriptions  Medication Sig Dispense Refill  . alendronate (FOSAMAX) 70 MG tablet once a week.  3  . amitriptyline (ELAVIL) 50 MG tablet 25 mg daily. 1/2 tab for '25mg'$  PO HS prn sleep.  If we can - we may Discontinue this medicine.    Marland Kitchen aspirin EC 81 MG tablet Take 81 mg by mouth at bedtime.    Marland Kitchen atenolol (TENORMIN) 25 MG tablet 25 mg daily.    . cholecalciferol (VITAMIN D) 1000 UNITS tablet Take 1,000 Units by mouth daily.    Marland Kitchen escitalopram (LEXAPRO) 10 MG tablet Take 10 mg by mouth daily.  5  . fish oil-omega-3 fatty acids 1000 MG capsule Take 1 g by mouth 2 (two) times daily.    Marland Kitchen lisinopril (PRINIVIL,ZESTRIL) 40 MG tablet Take 20 mg by mouth daily.     . Multiple Vitamins-Minerals (MULTIVITAMINS THER. W/MINERALS) TABS Take 1 tablet by mouth daily.     No current facility-administered medications for this visit.    PAST MEDICAL HISTORY: Past Medical History  Diagnosis Date  . Hypertension   .  Headache(784.0)   . GERD (gastroesophageal reflux disease)   . Hyperlipidemia   . Obesity   . Migraine   . REM sleep behavior disorder   . Other extrapyramidal disease and abnormal movement disorder   . Myasthenia gravis without exacerbation ([place])   . Abnormality of gait   . Unspecified psychosis   . Weakness     PAST SURGICAL HISTORY: Past Surgical History  Procedure Laterality Date  . Cataract extraction Bilateral     FAMILY HISTORY: Family History  Problem Relation Age of Onset  .       SOCIAL HISTORY:  Social History   Social History  . Marital Status: Married    Spouse Name: Glen Finley  . Number of Children: 2  . Years of Education: 12   Occupational History  . 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 Smoking status: Never Smoker   . Smokeless tobacco: Never Used  . Alcohol Use: No  . Drug Use: No  . Sexual Activity: No   Other Topics Concern  . Not on file   Social History Narrative   Patient lives at home with his wife Glen Finley). Patient is retired. High school education.   Left  handed.   Caffeine- One cup daily.     PHYSICAL EXAM   Filed Vitals:   03/07/15 1116  BP: 160/72  Pulse: 68  Height: '5\' 9"'$  (1.753 m)  Weight: 195 lb (88.451 kg)    Not recorded      Body mass index is 28.78 kg/(m^2).  PHYSICAL EXAMNIATION:  Gen: NAD, conversant, well nourised, obese, well groomed                     Cardiovascular: Regular rate rhythm, no peripheral edema, warm, nontender. Eyes: Conjunctivae clear without exudates or hemorrhage Neck: Supple, no carotid bruise. Pulmonary: Clear to auscultation bilaterally   NEUROLOGICAL EXAM:  MENTAL STATUS: Speech:    Speech is normal; fluent and spontaneous with normal comprehension.  Cognition:     Orientation to time, place and person     Normal recent and remote memory     Normal Attention span and concentration     Normal Language, naming, repeating,spontaneous speech     Fund  of knowledge   CRANIAL NERVES: CN II: Visual fields are full to confrontation. Fundoscopic exam is normal with sharp discs and no vascular changes. Pupils are round equal and briskly reactive to light. CN III, IV, VI: extraocular movement are normal. No ptosis. CN V: Facial sensation is intact to pinprick in all 3 divisions bilaterally. Corneal responses are intact.  CN VII: Face is symmetric with normal eye closure and smile. CN VIII: Hearing is normal to rubbing fingers CN IX, X: Palate elevates symmetrically. Phonation is normal. CN XI: Head turning and shoulder shrug are intact CN XII: Tongue is midline with normal movements and no atrophy.  MOTOR: There is no pronator drift of out-stretched arms. Muscle bulk and tone are normal. Muscle strength is normal.  REFLEXES: Reflexes are 2+ and symmetric at the biceps, triceps, knees, and ankles. Plantar responses are flexor.  SENSORY: Intact to light touch, pinprick, position sense, and vibration sense are intact in fingers and toes.  COORDINATION: Rapid alternating movements and fine finger movements are intact. There is no dysmetria on finger-to-nose and heel-knee-shin.    GAIT/STANCE: Posture is normal. Gait is steady with normal steps, base, arm swing, and turning.    DIAGNOSTIC DATA (LABS, IMAGING, TESTING) - I reviewed patient records, labs, notes, testing and imaging myself where available.   ASSESSMENT AND PLAN  Glen Finley is a 80 y.o. male   Serum positive generalized myasthenia gravis  Is in a medical remission,  Is not on immunosuppressive treatment or Mestinon treatment, remaining asymptomatic,  I have educated him about the signs of myasthenia gravis symptoms, he is to call my office for new symptoms,  Excessive daytime sleepiness, fatigue, snoring,  Suggestive of obstructive sleep apnea, he wants to stop hold of sleep study at this point   Glen Finley, M.D. Ph.D.  [facility] [address], [place],  [address] Ph: [phone] Fax: [phone]  CC: Referring Provider

## 2015-03-08 ENCOUNTER — Ambulatory Visit: Payer: Medicare Other | Admitting: Neurology

## 2016-05-11 ENCOUNTER — Other Ambulatory Visit: Payer: Self-pay | Admitting: Internal Medicine

## 2016-05-11 DIAGNOSIS — I517 Cardiomegaly: Secondary | ICD-10-CM

## 2016-05-11 DIAGNOSIS — R0600 Dyspnea, unspecified: Secondary | ICD-10-CM

## 2016-05-28 ENCOUNTER — Other Ambulatory Visit: Payer: Self-pay

## 2016-05-28 ENCOUNTER — Ambulatory Visit (HOSPITAL_COMMUNITY): Payer: Medicare Other | Attending: Internal Medicine

## 2016-05-28 DIAGNOSIS — I517 Cardiomegaly: Secondary | ICD-10-CM | POA: Insufficient documentation

## 2016-05-28 DIAGNOSIS — R0600 Dyspnea, unspecified: Secondary | ICD-10-CM | POA: Insufficient documentation

## 2016-11-16 ENCOUNTER — Other Ambulatory Visit: Payer: Self-pay | Admitting: Internal Medicine

## 2016-11-16 DIAGNOSIS — K7469 Other cirrhosis of liver: Secondary | ICD-10-CM

## 2016-11-16 DIAGNOSIS — I77811 Abdominal aortic ectasia: Secondary | ICD-10-CM

## 2016-12-14 ENCOUNTER — Ambulatory Visit
Admission: RE | Admit: 2016-12-14 | Discharge: 2016-12-14 | Disposition: A | Discharge status: Home / Self Care | Payer: Medicare Other | Source: Ambulatory Visit | Attending: Internal Medicine | Admitting: Internal Medicine

## 2016-12-14 DIAGNOSIS — K7469 Other cirrhosis of liver: Secondary | ICD-10-CM

## 2016-12-14 DIAGNOSIS — I77811 Abdominal aortic ectasia: Secondary | ICD-10-CM

## 2016-12-17 ENCOUNTER — Other Ambulatory Visit: Payer: Self-pay | Admitting: Internal Medicine

## 2016-12-17 DIAGNOSIS — R160 Hepatomegaly, not elsewhere classified: Secondary | ICD-10-CM

## 2016-12-31 ENCOUNTER — Ambulatory Visit
Admission: RE | Admit: 2016-12-31 | Discharge: 2016-12-31 | Disposition: A | Discharge status: Home / Self Care | Payer: Medicare Other | Source: Ambulatory Visit | Attending: Internal Medicine | Admitting: Internal Medicine

## 2016-12-31 DIAGNOSIS — R160 Hepatomegaly, not elsewhere classified: Secondary | ICD-10-CM

## 2016-12-31 MED ORDER — GADOBENATE DIMEGLUMINE 529 MG/ML IV SOLN
17.0000 mL | Freq: Once | INTRAVENOUS | Status: AC | PRN
  Administered 2016-12-31: 17 mL via INTRAVENOUS

## 2017-01-27 ENCOUNTER — Ambulatory Visit: Payer: Medicare Other | Attending: Internal Medicine | Admitting: Physical Therapy

## 2017-01-27 DIAGNOSIS — M25561 Pain in right knee: Secondary | ICD-10-CM | POA: Diagnosis not present

## 2017-01-27 DIAGNOSIS — R262 Difficulty in walking, not elsewhere classified: Secondary | ICD-10-CM | POA: Diagnosis present

## 2017-01-27 DIAGNOSIS — G8929 Other chronic pain: Secondary | ICD-10-CM | POA: Insufficient documentation

## 2017-01-28 ENCOUNTER — Encounter: Payer: Self-pay | Admitting: Physical Therapy
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
## 2017-02-08 ENCOUNTER — Encounter: Payer: Self-pay | Admitting: Physical Therapy

## 2017-02-08 ENCOUNTER — Ambulatory Visit: Payer: Medicare Other | Admitting: Physical Therapy

## 2017-02-08 DIAGNOSIS — M25561 Pain in right knee: Principal | ICD-10-CM

## 2017-02-08 DIAGNOSIS — R262 Difficulty in walking, not elsewhere classified: Secondary | ICD-10-CM

## 2017-02-08 DIAGNOSIS — G8929 Other chronic pain: Secondary | ICD-10-CM

## 2017-02-09 NOTE — Therapy (Signed)
[place], [place], [address] Phone: [phone]   Fax:  [phone]  Physical Therapy Treatment  Patient Details  Name: Glen Finley MRN: [id number] Date of Birth: 1935/02/24 Referring Provider: Dr Shon Baton   Encounter Date: 02/08/2017  PT End of Session - 02/08/17 1338    Visit Number  2    Number of Visits  16    Date for PT Re-Evaluation  03/25/17    Authorization Type  UHC MCR    PT Start Time  1331    PT Stop Time  1415    PT Time Calculation (min)  44 min    Activity Tolerance  Patient tolerated treatment well    Behavior During Therapy  [facility] for tasks assessed/performed       Past Medical History:  Diagnosis Date  . Abnormality of gait   . GERD (gastroesophageal reflux disease)   . Headache(784.0)   . Hyperlipidemia   . Hypertension   . Migraine   . Myasthenia gravis without exacerbation ([place])   . Obesity   . Other extrapyramidal disease and abnormal movement disorder   . REM sleep behavior disorder   . Unspecified psychosis   . Weakness     Past Surgical History:  Procedure Laterality Date  . CATARACT EXTRACTION Bilateral     There were no vitals filed for this visit.  Subjective Assessment - 02/08/17 1337    Subjective  Patient reports his knee has been a little better. He has been working on his exercises. He was able to go up steps after last visit with very little pain.     Limitations  Standing;Walking    Diagnostic tests  Nothing in the chart     Patient Stated Goals  to have less pain     Currently in Pain?  No/denies                      [facility] Adult PT Treatment/Exercise - 02/09/17 0001      Knee/Hip Exercises: Stretches   Active Hamstring Stretch Limitations  seated 3x20 sec hold       Knee/Hip Exercises: Supine   Quad Sets Limitations  3x10     Short Arc Quad Sets Limitations  2x10     Bridges Limitations  x10 mod cuing for technique     Straight Leg Raises Limitations  2x10 bilateral       Modalities   Modalities  Moist Heat with creeper stretch       Manual Therapy   Manual therapy comments  Patellar creeper stretch x5 minutes superior/ inferior                PT Short Term Goals - 01/28/17 1458      PT SHORT TERM GOAL #1   Title  Patient will be independnet with HEP     Time  4    Period  Weeks    Status  New    Target Date  02/25/17      PT SHORT TERM GOAL #2   Title  Patient will increase R LE strength to 5/5    Time  4    Period  Weeks    Status  New    Target Date  02/25/17      PT SHORT TERM GOAL #3   Title  Patient will demsotrate moderate restriction of patella mobility     Time  4  Period  Weeks    Status  New    Target Date  02/25/17        PT Long Term Goals - 01/28/17 1513      PT LONG TERM GOAL #1   Title  Patient will go up/down 5 steps with < 2/10 pain     Time  8    Period  Weeks    Status  New    Target Date  03/25/17      PT LONG TERM GOAL #2   Title  Patient will be independent with home exercises to continue improving knee stability     Time  8    Period  Weeks    Status  New    Target Date  03/25/17      PT LONG TERM GOAL #3   Title  Patient will demsotrate a 47% limitation on FOTO     Time  8    Period  Weeks    Status  New            Plan - 02/09/17 0920    Clinical Impression Statement  Patient tolerated treatment well. he had improved patellar motion after treatment. He was shown how to do a stretch at home. he is having very little pain at this time. He was gtiven an updated HEP. Continue to progresss  tolerated.     Clinical Presentation  Stable    Clinical Decision Making  Low    Rehab Potential  Good    PT Frequency  2x / week    PT Duration  8 weeks    PT Treatment/Interventions  ADLs/Self Care Home Management;Cryotherapy;Electrical Stimulation;Moist Heat;Ultrasound;Iontophoresis 4mg /ml Dexamethasone;Stair training;Gait  training;Therapeutic activities;Therapeutic exercise;Neuromuscular re-education;Manual techniques;Passive range of motion;Taping    PT Next Visit Plan  add brisging; SAQ; hamstring stretch; quad stretch; review patellar creeper stretch with heat    PT Home Exercise Plan  qauad set; supine clamshell; SLR     Consulted and Agree with Plan of Care  Patient       Patient will benefit from skilled therapeutic intervention in order to improve the following deficits and impairments:  Abnormal gait, Pain, Decreased activity tolerance, Decreased range of motion, Difficulty walking  Visit Diagnosis: Chronic pain of right knee  Difficulty in walking, not elsewhere classified     Problem List Patient Active Problem List   Diagnosis Date Noted  . Sleep apnea 03/07/2015  . Headache(784.0)   . GERD (gastroesophageal reflux disease)   . Obesity   . REM sleep behavior disorder   . Other extrapyramidal disease and abnormal movement disorder   . Myasthenia gravis without exacerbation ([place])   . Abnormality of gait   . Unspecified psychosis   . Weakness   . Dysarthria 08/10/2011    Class: Acute  . Gait instability 08/10/2011    Class: Acute  . Hypertension 08/10/2011    Class: Chronic  . Hyperlipidemia 08/10/2011    Class: Chronic    Carney Living PT DPT  02/09/2017, 9:22 AM  [facility] [address] [place], [place], [address] Phone: [phone]   Fax:  [phone]  Name: Glen Finley MRN: [id number] Date of Birth: Mar 03, 1935

## 2017-02-22 ENCOUNTER — Ambulatory Visit: Payer: Medicare Other | Attending: Internal Medicine | Admitting: Physical Therapy

## 2017-02-22 DIAGNOSIS — G8929 Other chronic pain: Secondary | ICD-10-CM

## 2017-02-22 DIAGNOSIS — R262 Difficulty in walking, not elsewhere classified: Secondary | ICD-10-CM | POA: Insufficient documentation

## 2017-02-22 DIAGNOSIS — M25561 Pain in right knee: Secondary | ICD-10-CM | POA: Diagnosis not present

## 2017-02-23 NOTE — Therapy (Signed)
[place], [place], [address] Phone: [phone]   Fax:  [phone]  Physical Therapy Treatment  Patient Details  Name: Glen Finley MRN: [id number] Date of Birth: November 13, 1934 Referring Provider: Dr Shon Baton   Encounter Date: 02/22/2017  PT End of Session - 02/22/17 1336    Visit Number  3    Number of Visits  16    Date for PT Re-Evaluation  03/25/17    Authorization Type  UHC MCR    PT Start Time  1300    PT Stop Time  1343    PT Time Calculation (min)  43 min    Activity Tolerance  Patient tolerated treatment well    Behavior During Therapy  [facility] for tasks assessed/performed       Past Medical History:  Diagnosis Date  . Abnormality of gait   . GERD (gastroesophageal reflux disease)   . Headache(784.0)   . Hyperlipidemia   . Hypertension   . Migraine   . Myasthenia gravis without exacerbation ([place])   . Obesity   . Other extrapyramidal disease and abnormal movement disorder   . REM sleep behavior disorder   . Unspecified psychosis   . Weakness     Past Surgical History:  Procedure Laterality Date  . CATARACT EXTRACTION Bilateral     There were no vitals filed for this visit.  Subjective Assessment - 02/22/17 1335    Subjective  Patient reports his knee was a little sore yesterday. He reports it is still better.     Limitations  Standing;Walking    Diagnostic tests  Nothing in the chart     Patient Stated Goals  to have less pain     Currently in Pain?  No/denies    Multiple Pain Sites  No                      [facility] Adult PT Treatment/Exercise - 02/23/17 0001      Knee/Hip Exercises: Stretches   Active Hamstring Stretch Limitations  seated 3x20 sec hold       Knee/Hip Exercises: Standing   Heel Raises Limitations  2x10     Hip Flexion Limitations  standing march 2x10     Abduction Limitations  with right only 2x10     Extension Limitations  right 2x10        Knee/Hip Exercises: Supine   Quad Sets Limitations  3x10     Short Arc Quad Sets Limitations  2x10    Bridges Limitations  x10 mod cuing for technique     Straight Leg Raises Limitations  2x10 bilateral       Modalities   Modalities  Moist Heat with creeper stretch       Manual Therapy   Manual therapy comments  Patellar creeper stretch x5 minutes superior/ inferior              PT Education - 02/22/17 1335    Education provided  Yes    Education Details  reviewed HEP     Person(s) Educated  Patient    Methods  Explanation;Demonstration;Verbal cues;Tactile cues    Comprehension  Verbalized understanding;Returned demonstration;Verbal cues required;Tactile cues required       PT Short Term Goals - 02/23/17 1608      PT SHORT TERM GOAL #1   Title  Patient will be independnet with HEP     Time  4    Period  Weeks    Status  On-going      PT SHORT TERM GOAL #2   Title  Patient will increase R LE strength to 5/5    Time  4    Period  Weeks    Status  On-going      PT SHORT TERM GOAL #3   Title  Patient will demsotrate moderate restriction of patella mobility     Time  4    Period  Weeks    Status  On-going        PT Long Term Goals - 01/28/17 1513      PT LONG TERM GOAL #1   Title  Patient will go up/down 5 steps with < 2/10 pain     Time  8    Period  Weeks    Status  New    Target Date  03/25/17      PT LONG TERM GOAL #2   Title  Patient will be independent with home exercises to continue improving knee stability     Time  8    Period  Weeks    Status  New    Target Date  03/25/17      PT LONG TERM GOAL #3   Title  Patient will demsotrate a 47% limitation on FOTO     Time  8    Period  Weeks    Status  New            Plan - 02/23/17 1607    Clinical Impression Statement  Therapy added in standing exercises. he was advised to do standing exercises or supine exercises. He had no increase in pain with treatment. He was encouraged to  conitnue with his HEP.     Clinical Presentation  Stable    Clinical Decision Making  Low    Rehab Potential  Good    PT Frequency  2x / week    PT Duration  8 weeks    PT Treatment/Interventions  ADLs/Self Care Home Management;Cryotherapy;Electrical Stimulation;Moist Heat;Ultrasound;Iontophoresis 4mg /ml Dexamethasone;Stair training;Gait training;Therapeutic activities;Therapeutic exercise;Neuromuscular re-education;Manual techniques;Passive range of motion;Taping    PT Next Visit Plan  add brisging; SAQ; hamstring stretch; quad stretch; review patellar creeper stretch with heat    PT Home Exercise Plan  qauad set; supine clamshell; SLR        Patient will benefit from skilled therapeutic intervention in order to improve the following deficits and impairments:  Abnormal gait, Pain, Decreased activity tolerance, Decreased range of motion, Difficulty walking  Visit Diagnosis: Chronic pain of right knee  Difficulty in walking, not elsewhere classified     Problem List Patient Active Problem List   Diagnosis Date Noted  . Sleep apnea 03/07/2015  . Headache(784.0)   . GERD (gastroesophageal reflux disease)   . Obesity   . REM sleep behavior disorder   . Other extrapyramidal disease and abnormal movement disorder   . Myasthenia gravis without exacerbation ([place])   . Abnormality of gait   . Unspecified psychosis   . Weakness   . Dysarthria 08/10/2011    Class: Acute  . Gait instability 08/10/2011    Class: Acute  . Hypertension 08/10/2011    Class: Chronic  . Hyperlipidemia 08/10/2011    Class: Chronic    Carney Living PT DPT  02/23/2017, 4:09 PM  [facility] [address] [place], [place], [address] Phone: [phone]   Fax:  [phone]  Name: KENLEY TROOP MRN: [id number] Date  of Birth: [birth year]

## 2017-03-08 ENCOUNTER — Ambulatory Visit: Payer: Medicare Other | Admitting: Physical Therapy

## 2017-03-08 ENCOUNTER — Encounter: Payer: Self-pay | Admitting: Physical Therapy

## 2017-03-08 DIAGNOSIS — M25561 Pain in right knee: Principal | ICD-10-CM

## 2017-03-08 DIAGNOSIS — G8929 Other chronic pain: Secondary | ICD-10-CM

## 2017-03-08 DIAGNOSIS — R262 Difficulty in walking, not elsewhere classified: Secondary | ICD-10-CM

## 2017-03-09 NOTE — Therapy (Signed)
[place], [place], [address] Phone: [phone]   Fax:  [phone]  Physical Therapy Treatment  Patient Details  Name: Glen Finley MRN: [id number] Date of Birth: 02-Nov-1934 Referring Provider: Dr Shon Baton   Encounter Date: 03/08/2017  PT End of Session - 03/08/17 1406    Visit Number  4    Number of Visits  16    Date for PT Re-Evaluation  03/25/17    Authorization Type  UHC MCR    PT Start Time  1330    PT Stop Time  1411    PT Time Calculation (min)  41 min    Activity Tolerance  Patient tolerated treatment well    Behavior During Therapy  [facility] for tasks assessed/performed       Past Medical History:  Diagnosis Date  . Abnormality of gait   . GERD (gastroesophageal reflux disease)   . Headache(784.0)   . Hyperlipidemia   . Hypertension   . Migraine   . Myasthenia gravis without exacerbation ([place])   . Obesity   . Other extrapyramidal disease and abnormal movement disorder   . REM sleep behavior disorder   . Unspecified psychosis   . Weakness     Past Surgical History:  Procedure Laterality Date  . CATARACT EXTRACTION Bilateral     There were no vitals filed for this visit.  Subjective Assessment - 03/08/17 1335    Subjective  Patient has had 2 falls. He fell 1x on the ice and 1 tinme his dog pulled him down. He had a small brusie on his knee. He is not having pain today.     Limitations  Standing;Walking    Diagnostic tests  Nothing in the chart     Patient Stated Goals  to have less pain     Currently in Pain?  No/denies    Pain Onset  More than a month ago    Pain Frequency  Constant    Aggravating Factors   stairs, standing                       [facility] Adult PT Treatment/Exercise - 03/09/17 0001      Knee/Hip Exercises: Standing   Heel Raises Limitations  2x10     Hip Flexion Limitations  standing march 2x10     Abduction Limitations  with right only  2x10     Extension Limitations  right 2x10       Knee/Hip Exercises: Supine   Quad Sets  10 reps    Short Arc Quad Sets  2 sets;10 reps    Bridges  10 reps;2 sets    Straight Leg Raises Limitations  2x10 bilateral       Manual Therapy   Manual therapy comments  Patellar creeper stretch x5 minutes superior/ inferior              PT Education - 03/08/17 1337    Education provided  Yes    Education Details  technique with home exercises     Person(s) Educated  Patient    Methods  Explanation;Demonstration;Tactile cues;Verbal cues    Comprehension  Verbalized understanding;Returned demonstration;Tactile cues required;Verbal cues required       PT Short Term Goals - 02/23/17 1608      PT SHORT TERM GOAL #1   Title  Patient will be independnet with HEP     Time  4    Period  Weeks    Status  On-going      PT SHORT TERM GOAL #2   Title  Patient will increase R LE strength to 5/5    Time  4    Period  Weeks    Status  On-going      PT SHORT TERM GOAL #3   Title  Patient will demsotrate moderate restriction of patella mobility     Time  4    Period  Weeks    Status  On-going        PT Long Term Goals - 01/28/17 1513      PT LONG TERM GOAL #1   Title  Patient will go up/down 5 steps with < 2/10 pain     Time  8    Period  Weeks    Status  New    Target Date  03/25/17      PT LONG TERM GOAL #2   Title  Patient will be independent with home exercises to continue improving knee stability     Time  8    Period  Weeks    Status  New    Target Date  03/25/17      PT LONG TERM GOAL #3   Title  Patient will demsotrate a 47% limitation on FOTO     Time  8    Period  Weeks    Status  New            Plan - 03/09/17 0741    Clinical Impression Statement  Patient is making good progress. He had no pain with treatment. He has fair patellar motion.  He has been doing his exercises.    Clinical Presentation  Stable    Clinical Decision Making  Low    Rehab  Potential  Good    PT Frequency  2x / week    PT Duration  8 weeks    PT Treatment/Interventions  ADLs/Self Care Home Management;Cryotherapy;Electrical Stimulation;Moist Heat;Ultrasound;Iontophoresis 4mg /ml Dexamethasone;Stair training;Gait training;Therapeutic activities;Therapeutic exercise;Neuromuscular re-education;Manual techniques;Passive range of motion;Taping    PT Next Visit Plan  add brisging; SAQ; hamstring stretch; quad stretch; review patellar creeper stretch with heat    PT Home Exercise Plan  qauad set; supine clamshell; SLR     Consulted and Agree with Plan of Care  Patient       Patient will benefit from skilled therapeutic intervention in order to improve the following deficits and impairments:  Abnormal gait, Pain, Decreased activity tolerance, Decreased range of motion, Difficulty walking  Visit Diagnosis: Chronic pain of right knee  Difficulty in walking, not elsewhere classified     Problem List Patient Active Problem List   Diagnosis Date Noted  . Sleep apnea 03/07/2015  . Headache(784.0)   . GERD (gastroesophageal reflux disease)   . Obesity   . REM sleep behavior disorder   . Other extrapyramidal disease and abnormal movement disorder   . Myasthenia gravis without exacerbation ([place])   . Abnormality of gait   . Unspecified psychosis   . Weakness   . Dysarthria 08/10/2011    Class: Acute  . Gait instability 08/10/2011    Class: Acute  . Hypertension 08/10/2011    Class: Chronic  . Hyperlipidemia 08/10/2011    Class: Chronic    Carney Living PT DPT  03/09/2017, 7:43 AM  [facility] [address] [place], [place], [address] Phone: [phone]   Fax:  [phone]  Name: Glen Finley  MRN: [id number] Date of Birth: 08-Oct-1934

## 2017-03-22 ENCOUNTER — Ambulatory Visit: Payer: Medicare Other | Admitting: Physical Therapy

## 2017-03-22 DIAGNOSIS — G8929 Other chronic pain: Secondary | ICD-10-CM

## 2017-03-22 DIAGNOSIS — M25561 Pain in right knee: Principal | ICD-10-CM

## 2017-03-22 DIAGNOSIS — R262 Difficulty in walking, not elsewhere classified: Secondary | ICD-10-CM

## 2017-03-22 NOTE — Therapy (Signed)
[place], [place], [address] Phone: [phone]   Fax:  [phone]  Physical Therapy Treatment  Patient Details  Name: Glen Finley MRN: [id number] Date of Birth: 20-Aug-1934 Referring Provider: Dr Shon Baton   Encounter Date: 03/22/2017  PT End of Session - 03/22/17 1109    Visit Number  5    Number of Visits  16    Date for PT Re-Evaluation  05/17/17    Authorization Type  UHC MCR    PT Start Time  1102    PT Stop Time  1143    PT Time Calculation (min)  41 min    Activity Tolerance  Patient tolerated treatment well    Behavior During Therapy  [facility] for tasks assessed/performed       Past Medical History:  Diagnosis Date  . Abnormality of gait   . GERD (gastroesophageal reflux disease)   . Headache(784.0)   . Hyperlipidemia   . Hypertension   . Migraine   . Myasthenia gravis without exacerbation ([place])   . Obesity   . Other extrapyramidal disease and abnormal movement disorder   . REM sleep behavior disorder   . Unspecified psychosis   . Weakness     Past Surgical History:  Procedure Laterality Date  . CATARACT EXTRACTION Bilateral     There were no vitals filed for this visit.  Subjective Assessment - 03/22/17 1107    Subjective  Patient reports just a little pain when he moves it one way or another. He has had some increased popping on the knee. He has been wroking on his exercises.     Limitations  Standing;Walking    Diagnostic tests  Nothing in the chart     Patient Stated Goals  to have less pain     Currently in Pain?  No/denies         [facility] PT Assessment - 03/22/17 0001      Strength   Right Hip Flexion  5/5    Right Hip ABduction  5/5      Palpation   Patella mobility  Improved patella mobility       Special Tests    Special Tests  Knee Special Tests    Knee Special tests   Patellofemoral Grind Test (Clarke's Sign)      Ambulation/Gait   Gait Comments   continued inability but likley baseline.                   [facility] Adult PT Treatment/Exercise - 03/22/17 0001      Knee/Hip Exercises: Standing   Hip Flexion Limitations  standing march 3x10     Other Standing Knee Exercises  Balance: tandem stance 2x20 sec; narrow base eyes open 2x20 sec; eyes cloised 2x20 sec.       Knee/Hip Exercises: Seated   Long Arc Quad Limitations  2x10 1lb     Ball Squeeze  2x10    Clamshell with TheraBand  -- 2x10 green       Knee/Hip Exercises: Supine   Quad Sets Limitations  3x10     Short Arc Quad Sets  3 sets;5 reps;Limitations    Short Arc Quad Sets Limitations  1lb    Bridges  10 reps;2 sets    Straight Leg Raises Limitations  2x10 bilateral              PT Education - 03/22/17 1108    Education provided  Yes  Education Details  reviewed technique with ther-ex     Person(s) Educated  Patient    Methods  Demonstration;Explanation;Tactile cues;Verbal cues    Comprehension  Verbalized understanding;Returned demonstration;Verbal cues required;Tactile cues required       PT Short Term Goals - 03/22/17 1126      PT SHORT TERM GOAL #1   Title  Patient will be independnet with HEP     Baseline  with current HEP     Time  4    Period  Weeks    Status  Achieved      PT SHORT TERM GOAL #2   Title  Patient will increase R LE strength to 5/5    Baseline  5/5 on 12/31     Time  4    Period  Weeks    Status  Achieved      PT SHORT TERM GOAL #3   Title  Patient will demsotrate moderate restriction of patella mobility     Baseline  Moderetly restirced     Time  4    Period  Weeks    Status  Achieved        PT Long Term Goals - 01/28/17 1513      PT LONG TERM GOAL #1   Title  Patient will go up/down 5 steps with < 2/10 pain     Time  8    Period  Weeks    Status  New    Target Date  03/25/17      PT LONG TERM GOAL #2   Title  Patient will be independent with home exercises to continue improving knee stability      Time  8    Period  Weeks    Status  New    Target Date  03/25/17      PT LONG TERM GOAL #3   Title  Patient will demsotrate a 47% limitation on FOTO     Time  8    Period  Weeks    Status  New            Plan - 03/22/17 1121    Clinical Impression Statement  Patient is making good progress. His FOTO score has improved and his strength. Functionally he has improved as well. he feesl like he can walk further and perfrom more hosuehold activity. He is having very little pain.  He would benbefit from further therapy for 8 moreweeks. The patient is only coming 1x every 2 weeks.  He would like ot continue working on fucntional activity and balance. Patient worked on balance exercises today to improve his stability and decrease his fall risk.     Clinical Presentation  Stable    Clinical Decision Making  Low    Rehab Potential  Good    PT Frequency  2x / week    PT Duration  8 weeks    PT Treatment/Interventions  ADLs/Self Care Home Management;Cryotherapy;Electrical Stimulation;Moist Heat;Ultrasound;Iontophoresis 4mg /ml Dexamethasone;Stair training;Gait training;Therapeutic activities;Therapeutic exercise;Neuromuscular re-education;Manual techniques;Passive range of motion;Taping    PT Next Visit Plan  add brisging; SAQ; hamstring stretch; quad stretch; review patellar creeper stretch with heat    PT Home Exercise Plan  qauad set; supine clamshell; SLR     Consulted and Agree with Plan of Care  Patient       Patient will benefit from skilled therapeutic intervention in order to improve the following deficits and impairments:  Abnormal gait, Pain, Decreased activity tolerance, Decreased range of motion,  Difficulty walking  Visit Diagnosis: Chronic pain of right knee - Plan: PT plan of care cert/re-cert  Difficulty in walking, not elsewhere classified - Plan: PT plan of care cert/re-cert     Problem List Patient Active Problem List   Diagnosis Date Noted  . Sleep apnea 03/07/2015   . Headache(784.0)   . GERD (gastroesophageal reflux disease)   . Obesity   . REM sleep behavior disorder   . Other extrapyramidal disease and abnormal movement disorder   . Myasthenia gravis without exacerbation ([place])   . Abnormality of gait   . Unspecified psychosis   . Weakness   . Dysarthria 08/10/2011    Class: Acute  . Gait instability 08/10/2011    Class: Acute  . Hypertension 08/10/2011    Class: Chronic  . Hyperlipidemia 08/10/2011    Class: Chronic    Carney Living PT DPT  03/22/2017, 11:58 AM  [facility] [address] [place], [place], [address] Phone: [phone]   Fax:  [phone]  Name: Glen Finley MRN: [id number] Date of Birth: March 12, 1935

## 2017-04-05 ENCOUNTER — Ambulatory Visit: Payer: Medicare Other | Attending: Internal Medicine | Admitting: Physical Therapy

## 2017-04-05 ENCOUNTER — Encounter: Payer: Self-pay | Admitting: Physical Therapy

## 2017-04-05 DIAGNOSIS — M25561 Pain in right knee: Secondary | ICD-10-CM | POA: Insufficient documentation

## 2017-04-05 DIAGNOSIS — G8929 Other chronic pain: Secondary | ICD-10-CM | POA: Diagnosis present

## 2017-04-05 DIAGNOSIS — R262 Difficulty in walking, not elsewhere classified: Secondary | ICD-10-CM | POA: Diagnosis present

## 2017-04-05 NOTE — Therapy (Signed)
[place], [place], [address] Phone: [phone]   Fax:  [phone]  Physical Therapy Treatment  Patient Details  Name: Glen Finley MRN: [id number] Date of Birth: 05/06/1934 Referring Provider: Dr Shon Baton   Encounter Date: 04/05/2017  PT End of Session - 04/05/17 1125    Visit Number  6    Number of Visits  16    Date for PT Re-Evaluation  05/17/17    Authorization Type  UHC MCR    PT Start Time  1100    PT Stop Time  1140    PT Time Calculation (min)  40 min    Activity Tolerance  Patient tolerated treatment well    Behavior During Therapy  [facility] for tasks assessed/performed       Past Medical History:  Diagnosis Date  . Abnormality of gait   . GERD (gastroesophageal reflux disease)   . Headache(784.0)   . Hyperlipidemia   . Hypertension   . Migraine   . Myasthenia gravis without exacerbation ([place])   . Obesity   . Other extrapyramidal disease and abnormal movement disorder   . REM sleep behavior disorder   . Unspecified psychosis   . Weakness     Past Surgical History:  Procedure Laterality Date  . CATARACT EXTRACTION Bilateral     There were no vitals filed for this visit.  Subjective Assessment - 04/05/17 1104    Subjective  Patients pain level is about a 4/10 today. he has been working out in his shop a little more recently.     Limitations  Standing;Walking    Diagnostic tests  Nothing in the chart     Patient Stated Goals  to have less pain     Currently in Pain?  No/denies    Pain Orientation  Right;Anterior    Pain Descriptors / Indicators  Aching;Sore    Pain Onset  More than a month ago    Pain Frequency  Constant    Aggravating Factors   stairs and standing     Pain Relieving Factors  rest     Multiple Pain Sites  No         [facility] PT Assessment - 04/05/17 0001      Assessment   Medical Diagnosis  Right Knee Pain                   [facility] Adult PT  Treatment/Exercise - 04/05/17 0001      Knee/Hip Exercises: Standing   Heel Raises Limitations  2x10     Hip Flexion Limitations  standing march 3x10     Abduction Limitations  bilateral 2x10     Extension Limitations  bilateral 2x10       Knee/Hip Exercises: Seated   Long Arc Quad Limitations  2x10 1lb     Ball Squeeze  2x10      Knee/Hip Exercises: Supine   Quad Sets Limitations  3x10     Short Arc Quad Sets  3 sets;5 reps;Limitations    Bridges  10 reps;2 sets    Straight Leg Raises Limitations  2x10 bilateral       Manual Therapy   Manual therapy comments  Patellar creeper stretch x5 minutes superior/ inferior              PT Education - 04/05/17 1126    Education provided  Yes    Education Details  reviewed technoique with ther-ex  Person(s) Educated  Patient    Methods  Explanation;Demonstration;Tactile cues;Verbal cues    Comprehension  Verbalized understanding;Returned demonstration;Verbal cues required       PT Short Term Goals - 03/22/17 1126      PT SHORT TERM GOAL #1   Title  Patient will be independnet with HEP     Baseline  with current HEP     Time  4    Period  Weeks    Status  Achieved      PT SHORT TERM GOAL #2   Title  Patient will increase R LE strength to 5/5    Baseline  5/5 on 12/31     Time  4    Period  Weeks    Status  Achieved      PT SHORT TERM GOAL #3   Title  Patient will demsotrate moderate restriction of patella mobility     Baseline  Moderetly restirced     Time  4    Period  Weeks    Status  Achieved        PT Long Term Goals - 01/28/17 1513      PT LONG TERM GOAL #1   Title  Patient will go up/down 5 steps with < 2/10 pain     Time  8    Period  Weeks    Status  New    Target Date  03/25/17      PT LONG TERM GOAL #2   Title  Patient will be independent with home exercises to continue improving knee stability     Time  8    Period  Weeks    Status  New    Target Date  03/25/17      PT LONG TERM GOAL  #3   Title  Patient will demsotrate a 47% limitation on FOTO     Time  8    Period  Weeks    Status  New            Plan - 04/05/17 1133    Clinical Impression Statement  Patient continues to make progress. he had some tightness in his knee cap but it improved with mobilization. He has been working on his exercises at home.     Clinical Presentation  Stable    Clinical Decision Making  Low    Rehab Potential  Good    PT Frequency  2x / week    PT Duration  8 weeks    PT Treatment/Interventions  ADLs/Self Care Home Management;Cryotherapy;Electrical Stimulation;Moist Heat;Ultrasound;Iontophoresis 4mg /ml Dexamethasone;Stair training;Gait training;Therapeutic activities;Therapeutic exercise;Neuromuscular re-education;Manual techniques;Passive range of motion;Taping    PT Next Visit Plan  add brisging; SAQ; hamstring stretch; quad stretch; review patellar creeper stretch with heat    PT Home Exercise Plan  quad set; supine clamshell; SLR     Consulted and Agree with Plan of Care  Patient       Patient will benefit from skilled therapeutic intervention in order to improve the following deficits and impairments:  Abnormal gait, Pain, Decreased activity tolerance, Decreased range of motion, Difficulty walking  Visit Diagnosis: Chronic pain of right knee  Difficulty in walking, not elsewhere classified     Problem List Patient Active Problem List   Diagnosis Date Noted  . Sleep apnea 03/07/2015  . Headache(784.0)   . GERD (gastroesophageal reflux disease)   . Obesity   . REM sleep behavior disorder   . Other extrapyramidal disease and abnormal movement disorder   .  Myasthenia gravis without exacerbation ([place])   . Abnormality of gait   . Unspecified psychosis   . Weakness   . Dysarthria 08/10/2011    Class: Acute  . Gait instability 08/10/2011    Class: Acute  . Hypertension 08/10/2011    Class: Chronic  . Hyperlipidemia 08/10/2011    Class: Chronic    Carney Living PT DPT  04/05/2017, 1:23 PM  [facility] [address] [place], [place], [address] Phone: [phone]   Fax:  [phone]  Name: Glen Finley MRN: [id number] Date of Birth: 06/19/1934

## 2017-04-19 ENCOUNTER — Ambulatory Visit: Payer: Medicare Other | Admitting: Physical Therapy

## 2017-04-19 ENCOUNTER — Encounter: Payer: Self-pay | Admitting: Physical Therapy

## 2017-04-19 DIAGNOSIS — M25561 Pain in right knee: Secondary | ICD-10-CM | POA: Diagnosis not present

## 2017-04-19 DIAGNOSIS — R262 Difficulty in walking, not elsewhere classified: Secondary | ICD-10-CM

## 2017-04-19 DIAGNOSIS — G8929 Other chronic pain: Secondary | ICD-10-CM

## 2017-04-19 NOTE — Therapy (Signed)
[place], [place], [address] Phone: [phone]   Fax:  [phone]  Physical Therapy Treatment  Patient Details  Name: Glen Finley MRN: [id number] Date of Birth: [birth year] Referring Provider: Dr Shon Baton   Encounter Date: 04/19/2017  PT End of Session - 04/19/17 1105    Visit Number  7    Number of Visits  16    Date for PT Re-Evaluation  05/17/17    Authorization Type  UHC MCR    PT Start Time  1101    PT Stop Time  1145    PT Time Calculation (min)  44 min    Activity Tolerance  Patient tolerated treatment well    Behavior During Therapy  [facility] for tasks assessed/performed       Past Medical History:  Diagnosis Date  . Abnormality of gait   . GERD (gastroesophageal reflux disease)   . Headache(784.0)   . Hyperlipidemia   . Hypertension   . Migraine   . Myasthenia gravis without exacerbation ([place])   . Obesity   . Other extrapyramidal disease and abnormal movement disorder   . REM sleep behavior disorder   . Unspecified psychosis   . Weakness     Past Surgical History:  Procedure Laterality Date  . CATARACT EXTRACTION Bilateral     There were no vitals filed for this visit.  Subjective Assessment - 04/19/17 1104    Subjective  Patient has no complaints today     Limitations  Standing;Walking    Diagnostic tests  Nothing in the chart     Patient Stated Goals  to have less pain     Currently in Pain?  No/denies    Pain Onset  More than a month ago    Aggravating Factors   stairs and standing     Pain Relieving Factors  rest    Multiple Pain Sites  No                      [facility] Adult PT Treatment/Exercise - 04/19/17 0001      Knee/Hip Exercises: Standing   Heel Raises Limitations  2x10     Hip Flexion Limitations  standing march 3x10     Abduction Limitations  bilateral 2x10     Extension Limitations  bilateral 2x10     Forward Step Up  10 reps;Step Height:  4"      Knee/Hip Exercises: Seated   Long Arc Quad Limitations  2x10 1lb     Ball Squeeze  2x10      Knee/Hip Exercises: Supine   Short Arc Quad Sets  3 sets;10 reps    Short Arc Quad Sets Limitations  2lb     Bridges  10 reps;2 sets    Straight Leg Raises Limitations  3x10 bilateral       Manual Therapy   Manual therapy comments  Not required today              PT Education - 04/19/17 1105    Education provided  Yes    Education Details  reviewed technique with stretching     Person(s) Educated  Patient    Methods  Explanation;Tactile cues;Demonstration;Verbal cues    Comprehension  Verbalized understanding;Returned demonstration;Verbal cues required;Tactile cues required       PT Short Term Goals - 04/19/17 1121      PT SHORT TERM GOAL #1   Title  Patient will be  independnet with HEP     Period  Weeks    Status  Achieved      PT SHORT TERM GOAL #2   Title  Patient will increase R LE strength to 5/5    Baseline  5/5 on 12/31     Time  4    Period  Weeks    Status  Achieved      PT SHORT TERM GOAL #3   Title  Patient will demsotrate moderate restriction of patella mobility     Baseline  no restriction today     Time  4    Status  Achieved        PT Long Term Goals - 01/28/17 1513      PT LONG TERM GOAL #1   Title  Patient will go up/down 5 steps with < 2/10 pain     Time  8    Period  Weeks    Status  New    Target Date  03/25/17      PT LONG TERM GOAL #2   Title  Patient will be independent with home exercises to continue improving knee stability     Time  8    Period  Weeks    Status  New    Target Date  03/25/17      PT LONG TERM GOAL #3   Title  Patient will demsotrate a 47% limitation on FOTO     Time  8    Period  Weeks    Status  New            Plan - 04/19/17 1106    Clinical Impression Statement  Patient tolerated treatment well. He had no oincrease in pain with exercises. He was encouraged to sontinue with HEP. Therapy  added stair training. he reports overall he feels like he is having an eaiser time going up and down stairs. He also feels less popping and clicking.     Clinical Presentation  Stable    Clinical Decision Making  Low    Rehab Potential  Good    PT Frequency  2x / week    PT Duration  8 weeks    PT Treatment/Interventions  ADLs/Self Care Home Management;Cryotherapy;Electrical Stimulation;Moist Heat;Ultrasound;Iontophoresis 4mg /ml Dexamethasone;Stair training;Gait training;Therapeutic activities;Therapeutic exercise;Neuromuscular re-education;Manual techniques;Passive range of motion;Taping    PT Next Visit Plan  add brisging; SAQ; hamstring stretch; quad stretch; review patellar creeper stretch with heat    PT Home Exercise Plan  quad set; supine clamshell; SLR     Consulted and Agree with Plan of Care  Patient       Patient will benefit from skilled therapeutic intervention in order to improve the following deficits and impairments:  Abnormal gait, Pain, Decreased activity tolerance, Decreased range of motion, Difficulty walking  Visit Diagnosis: Chronic pain of right knee  Difficulty in walking, not elsewhere classified     Problem List Patient Active Problem List   Diagnosis Date Noted  . Sleep apnea 03/07/2015  . Headache(784.0)   . GERD (gastroesophageal reflux disease)   . Obesity   . REM sleep behavior disorder   . Other extrapyramidal disease and abnormal movement disorder   . Myasthenia gravis without exacerbation ([place])   . Abnormality of gait   . Unspecified psychosis   . Weakness   . Dysarthria 08/10/2011    Class: Acute  . Gait instability 08/10/2011    Class: Acute  . Hypertension 08/10/2011    Class: Chronic  .  Hyperlipidemia 08/10/2011    Class: Chronic    Carney Living  PT DPT  04/19/2017, 11:47 AM  [facility] [address] [place], [place], [address] Phone: [phone]   Fax:  [phone]  Name:  Glen Finley MRN: [id number] Date of Birth: [birth year]

## 2017-05-03 ENCOUNTER — Encounter: Payer: Self-pay | Admitting: Physical Therapy

## 2017-05-03 ENCOUNTER — Ambulatory Visit: Payer: Medicare Other | Attending: Internal Medicine | Admitting: Physical Therapy

## 2017-05-03 DIAGNOSIS — M25561 Pain in right knee: Secondary | ICD-10-CM | POA: Diagnosis not present

## 2017-05-03 DIAGNOSIS — G8929 Other chronic pain: Secondary | ICD-10-CM | POA: Diagnosis present

## 2017-05-03 DIAGNOSIS — R262 Difficulty in walking, not elsewhere classified: Secondary | ICD-10-CM | POA: Diagnosis present

## 2017-05-04 NOTE — Therapy (Signed)
[place], [place], [address] Phone: [phone]   Fax:  [phone]  Physical Therapy Treatment  Patient Details  Name: Glen Finley MRN: [id number] Date of Birth: 01-10-1935 Referring Provider: Dr Shon Baton   Encounter Date: 05/03/2017  PT End of Session - 05/03/17 1110    Visit Number  8    Number of Visits  16    Date for PT Re-Evaluation  05/17/17    Authorization Type  UHC MCR    PT Start Time  1102    PT Stop Time  1145    PT Time Calculation (min)  43 min    Activity Tolerance  Patient tolerated treatment well    Behavior During Therapy  [facility] for tasks assessed/performed       Past Medical History:  Diagnosis Date  . Abnormality of gait   . GERD (gastroesophageal reflux disease)   . Headache(784.0)   . Hyperlipidemia   . Hypertension   . Migraine   . Myasthenia gravis without exacerbation ([place])   . Obesity   . Other extrapyramidal disease and abnormal movement disorder   . REM sleep behavior disorder   . Unspecified psychosis   . Weakness     Past Surgical History:  Procedure Laterality Date  . CATARACT EXTRACTION Bilateral     There were no vitals filed for this visit.  Subjective Assessment - 05/03/17 1106    Subjective  Patient had a fall on Friday. He hit his knee. It has breen sore when he moves it. He was using a leaf blower and he lost his balance.     Limitations  Standing;Walking    Diagnostic tests  Nothing in the chart     Patient Stated Goals  to have less pain     Currently in Pain?  Yes    Pain Score  2     Pain Location  Knee    Pain Orientation  Right;Distal just below the knee cap    Pain Descriptors / Indicators  Aching    Pain Onset  More than a month ago    Pain Frequency  Constant    Aggravating Factors   stairs and standing     Pain Relieving Factors  rest                       [facility] Adult PT Treatment/Exercise - 05/04/17 0001       Knee/Hip Exercises: Standing   Heel Raises Limitations  2x10     Hip Flexion Limitations  standing march 3x10     Abduction Limitations  bilateral 2x10     Extension Limitations  bilateral 2x10     Forward Step Up  10 reps;Step Height: 4"      Knee/Hip Exercises: Seated   Long Arc Quad Limitations  2x10 1lb     Ball Squeeze  2x10      Knee/Hip Exercises: Supine   Short Arc Quad Sets  3 sets;10 reps    Bridges  10 reps;2 sets    Straight Leg Raises Limitations  3x10 bilateral       Manual Therapy   Manual therapy comments  Not required today              PT Education - 05/03/17 1108    Education provided  Yes    Education Details  reviewed technique     Person(s) Educated  Patient  Methods  Explanation;Demonstration;Tactile cues;Verbal cues    Comprehension  Verbalized understanding       PT Short Term Goals - 04/19/17 1121      PT SHORT TERM GOAL #1   Title  Patient will be independnet with HEP     Period  Weeks    Status  Achieved      PT SHORT TERM GOAL #2   Title  Patient will increase R LE strength to 5/5    Baseline  5/5 on 12/31     Time  4    Period  Weeks    Status  Achieved      PT SHORT TERM GOAL #3   Title  Patient will demsotrate moderate restriction of patella mobility     Baseline  no restriction today     Time  4    Status  Achieved        PT Long Term Goals - 01/28/17 1513      PT LONG TERM GOAL #1   Title  Patient will go up/down 5 steps with < 2/10 pain     Time  8    Period  Weeks    Status  New    Target Date  03/25/17      PT LONG TERM GOAL #2   Title  Patient will be independent with home exercises to continue improving knee stability     Time  8    Period  Weeks    Status  New    Target Date  03/25/17      PT LONG TERM GOAL #3   Title  Patient will demsotrate a 47% limitation on FOTO     Time  8    Period  Weeks    Status  New            Plan - 05/03/17 1110    Clinical Impression Statement  Despite  increased soreness the patient tolerated treatment well. He is perfroming exercises with good technique without cuing. Patient will likley D/C next visit. Therapy added steps. He had no pain.     Clinical Presentation  Stable    Clinical Decision Making  Low    Rehab Potential  Good    PT Frequency  2x / week    PT Duration  8 weeks    PT Treatment/Interventions  ADLs/Self Care Home Management;Cryotherapy;Electrical Stimulation;Moist Heat;Ultrasound;Iontophoresis 4mg /ml Dexamethasone;Stair training;Gait training;Therapeutic activities;Therapeutic exercise;Neuromuscular re-education;Manual techniques;Passive range of motion;Taping    PT Next Visit Plan  add brisging; SAQ; hamstring stretch; quad stretch; review patellar creeper stretch with heat    PT Home Exercise Plan  quad set; supine clamshell; SLR     Consulted and Agree with Plan of Care  Patient       Patient will benefit from skilled therapeutic intervention in order to improve the following deficits and impairments:  Abnormal gait, Pain, Decreased activity tolerance, Decreased range of motion, Difficulty walking  Visit Diagnosis: Chronic pain of right knee  Difficulty in walking, not elsewhere classified     Problem List Patient Active Problem List   Diagnosis Date Noted  . Sleep apnea 03/07/2015  . Headache(784.0)   . GERD (gastroesophageal reflux disease)   . Obesity   . REM sleep behavior disorder   . Other extrapyramidal disease and abnormal movement disorder   . Myasthenia gravis without exacerbation ([place])   . Abnormality of gait   . Unspecified psychosis   . Weakness   . Dysarthria 08/10/2011  Class: Acute  . Gait instability 08/10/2011    Class: Acute  . Hypertension 08/10/2011    Class: Chronic  . Hyperlipidemia 08/10/2011    Class: Chronic    Carney Living PT DPT  05/04/2017, 8:29 AM  [facility] [address] [place], [place], [address] Phone:  [phone]   Fax:  [phone]  Name: Glen Finley MRN: [id number] Date of Birth: 08/23/1934

## 2017-05-17 ENCOUNTER — Ambulatory Visit: Payer: Medicare Other | Admitting: Physical Therapy

## 2017-05-17 DIAGNOSIS — G8929 Other chronic pain: Secondary | ICD-10-CM

## 2017-05-17 DIAGNOSIS — R262 Difficulty in walking, not elsewhere classified: Secondary | ICD-10-CM

## 2017-05-17 DIAGNOSIS — M25561 Pain in right knee: Secondary | ICD-10-CM | POA: Diagnosis not present

## 2017-05-18 NOTE — Therapy (Signed)
[place], [place], [address] Phone: [phone]   Fax:  [phone]  Physical Therapy Treatment/ Discharge   Patient Details  Name: Glen Finley MRN: [id number] Date of Birth: May 11, 1934 Referring Provider: Dr Shon Baton   Encounter Date: 05/17/2017  PT End of Session - 05/17/17 1118    Visit Number  9    Number of Visits  16    Date for PT Re-Evaluation  05/17/17    Authorization Type  UHC MCR    PT Start Time  1100    PT Stop Time  1142    PT Time Calculation (min)  42 min    Activity Tolerance  Patient tolerated treatment well    Behavior During Therapy  [facility] for tasks assessed/performed       Past Medical History:  Diagnosis Date  . Abnormality of gait   . GERD (gastroesophageal reflux disease)   . Headache(784.0)   . Hyperlipidemia   . Hypertension   . Migraine   . Myasthenia gravis without exacerbation ([place])   . Obesity   . Other extrapyramidal disease and abnormal movement disorder   . REM sleep behavior disorder   . Unspecified psychosis   . Weakness     Past Surgical History:  Procedure Laterality Date  . CATARACT EXTRACTION Bilateral     There were no vitals filed for this visit.  Subjective Assessment - 05/17/17 1105    Subjective  Patient has been climbing a ladder and he is feeling a little sore. He is intalling cameras around the house.     Limitations  Standing;Walking    Diagnostic tests  Nothing in the chart     Patient Stated Goals  to have less pain     Currently in Pain?  Yes    Pain Score  2     Pain Location  Knee    Pain Orientation  Right    Pain Descriptors / Indicators  Aching    Pain Onset  More than a month ago    Pain Frequency  Constant    Aggravating Factors   stairs and standing     Pain Relieving Factors  rest                       [facility] Adult PT Treatment/Exercise - 05/18/17 0001      Knee/Hip Exercises: Standing   Heel  Raises Limitations  2x10     Hip Flexion Limitations  standing march 3x10     Abduction Limitations  bilateral 2x10     Extension Limitations  bilateral 2x10     Forward Step Up  10 reps;Step Height: 4"      Knee/Hip Exercises: Seated   Long Arc Quad Limitations  2x10 1lb     Ball Squeeze  2x10      Knee/Hip Exercises: Supine   Short Arc Quad Sets  3 sets;10 reps    Bridges  10 reps;2 sets    Straight Leg Raises Limitations  3x10 bilateral       Manual Therapy   Manual therapy comments  Not required today              PT Education - 05/17/17 1106    Education provided  Yes    Education Details  reviewed final HEP and technique     Person(s) Educated  Patient    Methods  Explanation;Tactile cues;Verbal cues;Demonstration    Comprehension  Verbalized understanding;Returned demonstration;Tactile cues required;Verbal cues required       PT Short Term Goals - 05/17/17 1128      PT SHORT TERM GOAL #1   Title  Patient will be independnet with HEP     Baseline  with current HEP     Time  4    Period  Weeks    Status  Achieved      PT SHORT TERM GOAL #2   Title  Patient will increase R LE strength to 5/5    Baseline  5/5 on 12/31     Time  4    Period  Weeks    Status  Achieved      PT SHORT TERM GOAL #3   Title  Patient will demsotrate moderate restriction of patella mobility     Baseline  no restriction today     Time  4    Period  Weeks    Status  Achieved        PT Long Term Goals - 05/17/17 1129      PT LONG TERM GOAL #1   Title  Patient will go up/down 5 steps with < 2/10 pain     Baseline  Patient can perfrom steps without difficulty     Time  8    Period  Weeks    Status  Achieved      PT LONG TERM GOAL #2   Title  Patient will be independent with home exercises to continue improving knee stability     Baseline  reviewed complete program     Time  8    Period  Weeks    Status  Achieved      PT LONG TERM GOAL #3   Title  Patient will  demsotrate a 47% limitation on FOTO     Baseline  43% limitation     Time  8    Period  Weeks    Status  Achieved            Plan - 05/17/17 1125    Clinical Impression Statement  Patient has reached goals for therapy.Marland Kitchen He is independnet with his exercise program. His strength has improved as well as his patella mobility. His FOTO score has continued to make slow and steady progress. This is how we would hope it would progress. He was advised to continue with his exercises at home. He will also consdier workoing with our group maitenence program.     Clinical Presentation  Stable    Clinical Decision Making  Low    Rehab Potential  Good    PT Frequency  2x / week    PT Duration  8 weeks    PT Treatment/Interventions  ADLs/Self Care Home Management;Cryotherapy;Electrical Stimulation;Moist Heat;Ultrasound;Iontophoresis 30m/ml Dexamethasone;Stair training;Gait training;Therapeutic activities;Therapeutic exercise;Neuromuscular re-education;Manual techniques;Passive range of motion;Taping    PT Next Visit Plan  add brisging; SAQ; hamstring stretch; quad stretch; review patellar creeper stretch with heat    PT Home Exercise Plan  quad set; supine clamshell; SLR     Consulted and Agree with Plan of Care  Patient       Patient will benefit from skilled therapeutic intervention in order to improve the following deficits and impairments:  Abnormal gait, Pain, Decreased activity tolerance, Decreased range of motion, Difficulty walking  Visit Diagnosis: Chronic pain of right knee  Difficulty in walking, not elsewhere classified  PHYSICAL THERAPY DISCHARGE SUMMARY  Visits from Start of Care: 9  Current functional  level related to goals / functional outcomes: Significant improvement in knee pain.    Remaining deficits: Pain when he does too much      Education / Equipment:  HEP  Plan: Patient agrees to discharge.  Patient goals were not met. Patient is being discharged due to meeting  the stated rehab goals.  ?????       Problem List Patient Active Problem List   Diagnosis Date Noted  . Sleep apnea 03/07/2015  . Headache(784.0)   . GERD (gastroesophageal reflux disease)   . Obesity   . REM sleep behavior disorder   . Other extrapyramidal disease and abnormal movement disorder   . Myasthenia gravis without exacerbation ([place])   . Abnormality of gait   . Unspecified psychosis   . Weakness   . Dysarthria 08/10/2011    Class: Acute  . Gait instability 08/10/2011    Class: Acute  . Hypertension 08/10/2011    Class: Chronic  . Hyperlipidemia 08/10/2011    Class: Chronic    Carney Living PT DPT  05/18/2017, 7:46 AM  [facility] [address] [place], [place], [address] Phone: [phone]   Fax:  [phone]  Name: Glen Finley MRN: [id number] Date of Birth: 1934-04-01

## 2017-08-11 ENCOUNTER — Other Ambulatory Visit (HOSPITAL_COMMUNITY): Payer: Self-pay | Admitting: Internal Medicine

## 2017-08-11 ENCOUNTER — Telehealth (HOSPITAL_COMMUNITY): Payer: Self-pay | Admitting: Internal Medicine

## 2017-08-11 DIAGNOSIS — R0600 Dyspnea, unspecified: Secondary | ICD-10-CM

## 2017-08-11 DIAGNOSIS — I517 Cardiomegaly: Secondary | ICD-10-CM

## 2017-08-19 ENCOUNTER — Ambulatory Visit (HOSPITAL_COMMUNITY): Payer: Medicare Other | Attending: Cardiovascular Disease

## 2017-08-19 ENCOUNTER — Other Ambulatory Visit: Payer: Self-pay

## 2017-08-19 DIAGNOSIS — I517 Cardiomegaly: Secondary | ICD-10-CM | POA: Diagnosis present

## 2017-08-19 DIAGNOSIS — I10 Essential (primary) hypertension: Secondary | ICD-10-CM | POA: Diagnosis not present

## 2017-08-19 DIAGNOSIS — R0600 Dyspnea, unspecified: Secondary | ICD-10-CM | POA: Insufficient documentation

## 2017-08-19 DIAGNOSIS — R609 Edema, unspecified: Secondary | ICD-10-CM | POA: Insufficient documentation

## 2017-08-19 DIAGNOSIS — G7000 Myasthenia gravis without (acute) exacerbation: Secondary | ICD-10-CM | POA: Diagnosis not present

## 2017-08-19 DIAGNOSIS — E785 Hyperlipidemia, unspecified: Secondary | ICD-10-CM | POA: Diagnosis not present

## 2017-08-24 NOTE — Telephone Encounter (Signed)
User: Cherie Dark A Date/time: 08/11/17 10:37 AM  Comment: Called pt and lmsg for him to CB to get sch for an echo.Vassie Moment  Context:  Outcome: Left Message  Phone number: [phone] Phone Type: Home Phone  Comm. type: Telephone Call type: Outgoing  Contact: Mikhail, Gwyndolyn Saxon A Relation to patient: Self

## 2018-02-21 ENCOUNTER — Encounter: Payer: Self-pay | Admitting: Cardiovascular Disease

## 2018-02-28 ENCOUNTER — Other Ambulatory Visit: Payer: Self-pay | Admitting: Internal Medicine

## 2018-02-28 DIAGNOSIS — R42 Dizziness and giddiness: Secondary | ICD-10-CM

## 2018-02-28 DIAGNOSIS — R0989 Other specified symptoms and signs involving the circulatory and respiratory systems: Secondary | ICD-10-CM

## 2018-03-01 ENCOUNTER — Ambulatory Visit (HOSPITAL_COMMUNITY)
Admission: RE | Admit: 2018-03-01 | Discharge: 2018-03-01 | Disposition: A | Discharge status: Home / Self Care | Payer: Medicare Other | Source: Ambulatory Visit | Attending: Internal Medicine | Admitting: Internal Medicine

## 2018-03-01 DIAGNOSIS — R0989 Other specified symptoms and signs involving the circulatory and respiratory systems: Secondary | ICD-10-CM | POA: Insufficient documentation

## 2018-04-04 NOTE — Progress Notes (Signed)
Cardiology Office Note   Date:  04/05/2018   ID:  Glen Finley, DOB 11-Nov-1934, MRN [id number]  PCP:  Glen Baton, MD  Cardiologist:   Glen Rouge, MD   No chief complaint on file.     History of Present Illness: Glen Finley is a 84 y.o. male who presents for consultation regarding diastolic dysfunction . Referred by Dr Glen Finley.  History of HTN, HLD and myasthenia gravis  Done well with Mestinon Rx Echo done 08/19/17 ordered for dyspnea. Reviewed EF 60-65% only grade one diastolic consistent with age normal valves normal estimated PA pressure BP not well controlled and Norvasc dose increased Previous TTE 2018 suggested aortic root dilated at 4.4 cm   Retired from [employer]. Has had worse LE edema since being on norvasc No chest pain dyspnea palpitations or syncope   Compliant with meds     Past Medical History:  Diagnosis Date  . Abnormality of gait   . GERD (gastroesophageal reflux disease)   . Headache(784.0)   . Hyperlipidemia   . Hypertension   . Migraine   . Myasthenia gravis without exacerbation ([place])   . Obesity   . Other extrapyramidal disease and abnormal movement disorder   . REM sleep behavior disorder   . Unspecified psychosis   . Weakness     Past Surgical History:  Procedure Laterality Date  . CATARACT EXTRACTION Bilateral      Current Outpatient Medications  Medication Sig Dispense Refill  . alendronate (FOSAMAX) 70 MG tablet once a week.  3  . amitriptyline (ELAVIL) 50 MG tablet 25 mg daily. 1/2 tab for 25mg  PO HS prn sleep.  If we can - we may Discontinue this medicine.    Marland Kitchen amLODipine (NORVASC) 10 MG tablet Take 10 mg by mouth daily.   3  . aspirin EC 81 MG tablet Take 81 mg by mouth at bedtime.    Marland Kitchen atenolol (TENORMIN) 25 MG tablet 25 mg daily.    . cholecalciferol (VITAMIN D) 1000 UNITS tablet Take 1,000 Units by mouth daily.    Marland Kitchen escitalopram (LEXAPRO) 10 MG tablet Take 10 mg by mouth daily.  5  . fish oil-omega-3 fatty acids 1000 MG capsule  Take 1 g by mouth 2 (two) times daily.    . furosemide (LASIX) 20 MG tablet Take 20 mg by mouth daily.     Marland Kitchen lactulose (CHRONULAC) 10 GM/15ML solution TK 30 ML PO Q 3 DAYS  2  . lisinopril (PRINIVIL,ZESTRIL) 40 MG tablet Take 40 mg by mouth 2 (two) times daily.     . Multiple Vitamins-Minerals (MULTIVITAMINS THER. W/MINERALS) TABS Take 1 tablet by mouth daily.     No current facility-administered medications for this visit.     Allergies:   Codeine    Social History:  The patient  reports that he has never smoked. He has never used smokeless tobacco. He reports that he does not drink alcohol or use drugs.   Family History:  The patient's family history is not on file.    ROS:  Please see the history of present illness.   Otherwise, review of systems are positive for none.   All other systems are reviewed and negative.    PHYSICAL EXAM: VS:  Pulse (!) 55   Ht 5\' 9"  (1.753 m)   Wt 201 lb 1.9 oz (91.2 kg)   SpO2 93%   BMI 29.70 kg/m  , BMI Body mass index is 29.7 kg/m.  BP 120/60 mmHg  Affect appropriate Healthy:  appears stated age 9: normal Neck supple with no adenopathy JVP normal no bruits no thyromegaly Lungs clear with no wheezing and good diaphragmatic motion Heart:  S1/S2 no murmur, no rub, gallop or click PMI normal Abdomen: benighn, BS positve, no tenderness, no AAA no bruit.  No HSM or HJR Distal pulses intact with no bruits Plus one LE edema Neuro non-focal Skin warm and dry No muscular weakness    EKG:  2013 SR rate 80 LAD low voltage  04/05/18 SR rate 55 normal    Recent Labs: No results found for requested labs within last 8760 hours.    Lipid Panel No results found for: CHOL, TRIG, HDL, CHOLHDL, VLDL, LDLCALC, LDLDIRECT    Wt Readings from Last 3 Encounters:  04/05/18 201 lb 1.9 oz (91.2 kg)  03/07/15 195 lb (88.5 kg)  03/08/14 187 lb (84.8 kg)      Other studies Reviewed: Additional studies/ records that were reviewed today include:  Notes from primary labs ECG And TTE from this year and 2018.    ASSESSMENT AND PLAN:  1. HTN:  Well controlled.  Continue current medications and low sodium Dash type diet.   2. Diastolic dysfunction: not clinically important only grade one with changes expected for age and HTN 3. Aortic root dilatation mentioned in TTE from 2018 but ok on f/u echo May 2019 f/u CTA chest at primary discretion  4. Edema: venous dependant made worse with addition of norvasc. Continue lasix f/u Glen Finley stop norvasc and consider hydralazine if needed but I suspect BP would be fine with just atenolol, lisinopril and lasix   Current medicines are reviewed at length with the patient today.  The patient does not have concerns regarding medicines.  The following changes have been made:  no change  Labs/ tests ordered today include: none  Orders Placed This Encounter  Procedures  . EKG 12-Lead     Disposition:   FU with cardiology PRN      Signed, Glen Rouge, MD  04/05/2018 9:25 AM    [facility] [place], [place], [place]  [address] Phone: [phone]; Fax: [phone]

## 2018-04-05 ENCOUNTER — Encounter: Payer: Self-pay | Admitting: Cardiovascular Disease

## 2018-04-05 ENCOUNTER — Ambulatory Visit: Payer: Medicare Other | Admitting: Cardiovascular Disease

## 2018-04-05 VITALS — HR 55 | Ht 69.0 in | Wt 201.1 lb

## 2018-04-05 DIAGNOSIS — I10 Essential (primary) hypertension: Secondary | ICD-10-CM | POA: Diagnosis not present

## 2018-04-05 DIAGNOSIS — I77810 Thoracic aortic ectasia: Secondary | ICD-10-CM

## 2018-04-05 DIAGNOSIS — I5189 Other ill-defined heart diseases: Secondary | ICD-10-CM | POA: Diagnosis not present

## 2018-04-05 NOTE — Patient Instructions (Addendum)
Medication Instructions:   If you need a refill on your cardiac medications before your next appointment, please call your pharmacy.   Lab work:  If you have labs (blood work) drawn today and your tests are completely normal, you will receive your results only by: Marland Kitchen MyChart Message (if you have MyChart) OR . A paper copy in the mail If you have any lab test that is abnormal or we need to change your treatment, we will call you to review the results.  Testing/Procedures: None ordered today.  Follow-Up: At [facility], you and your health needs are our priority.  As part of our continuing mission to provide you with exceptional heart care, we have created designated Provider Care Teams.  These Care Teams include your primary Cardiologist (physician) and Advanced Practice Providers (APPs -  Physician Assistants and Nurse Practitioners) who all work together to provide you with the care you need, when you need it. Your physician recommends that you schedule a follow-up appointment as needed with Dr. Johnsie Cancel.

## 2018-05-01 IMAGING — US US ABDOMEN COMPLETE
1 series · 13 of 25 positions shown · non-contrast
Comparison: Abdominal and pelvic CT scan June 27, 2014

CLINICAL DATA: Cirrhosis, abdominal aortic ectasia.

EXAM:
ABDOMEN ULTRASOUND COMPLETE

[Series 1: us abdomen complete · 0.25mm/px · 13 of 112 slices shown]
[im 1/112]
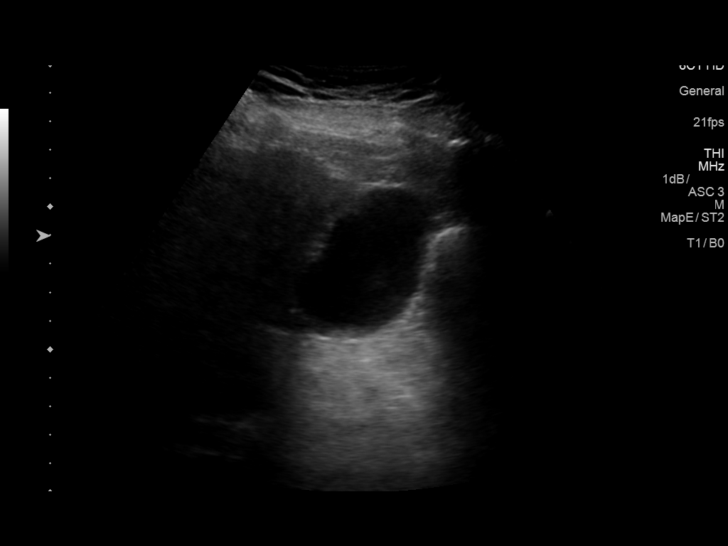
[im 10/112]
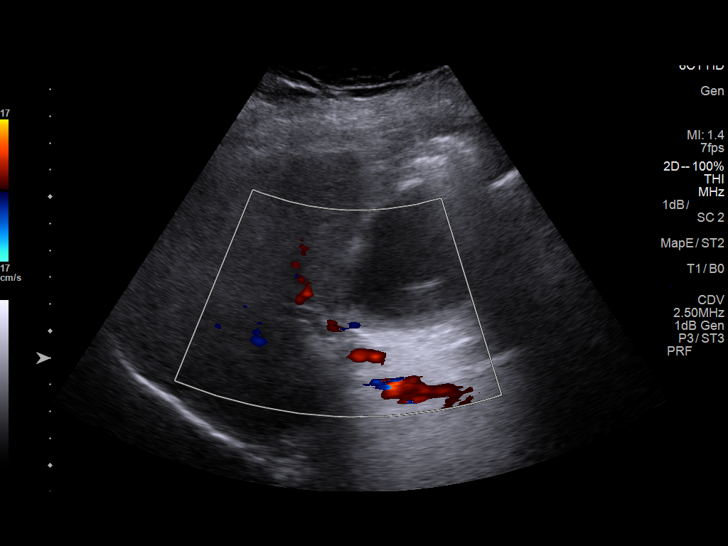
[im 19/112]
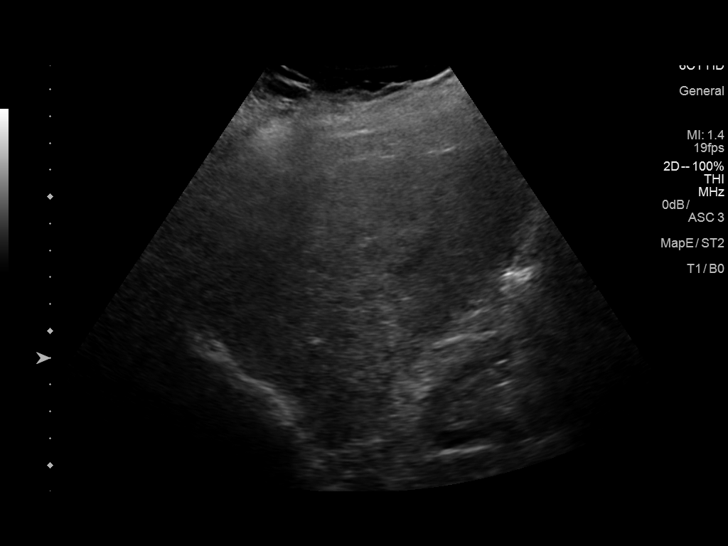
[im 28/112]
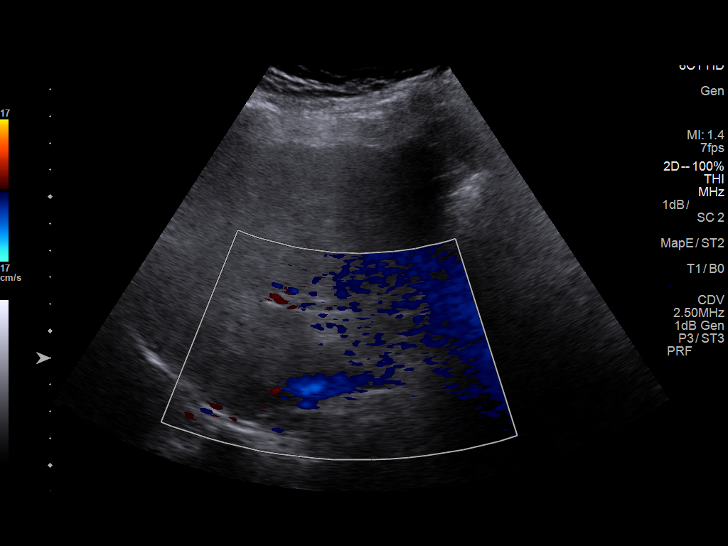
[im 38/112]
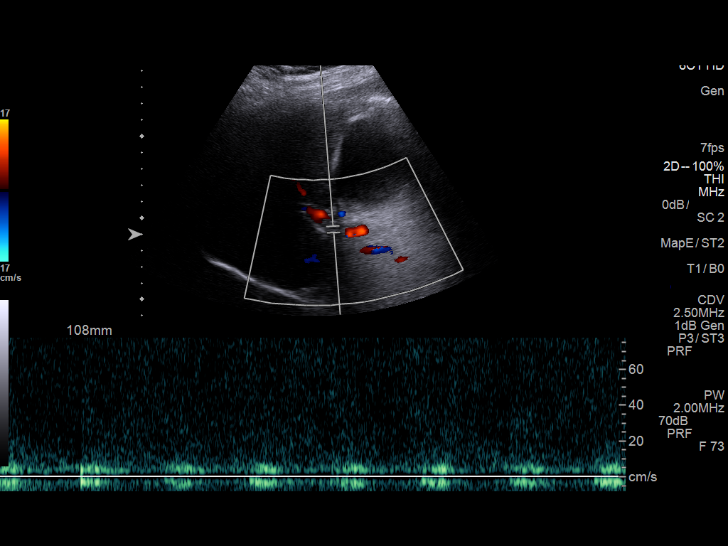
[im 47/112]
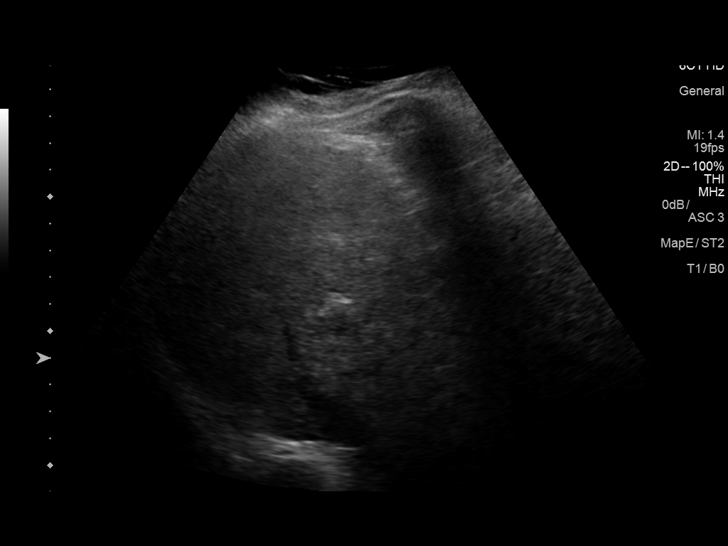
[im 56/112]
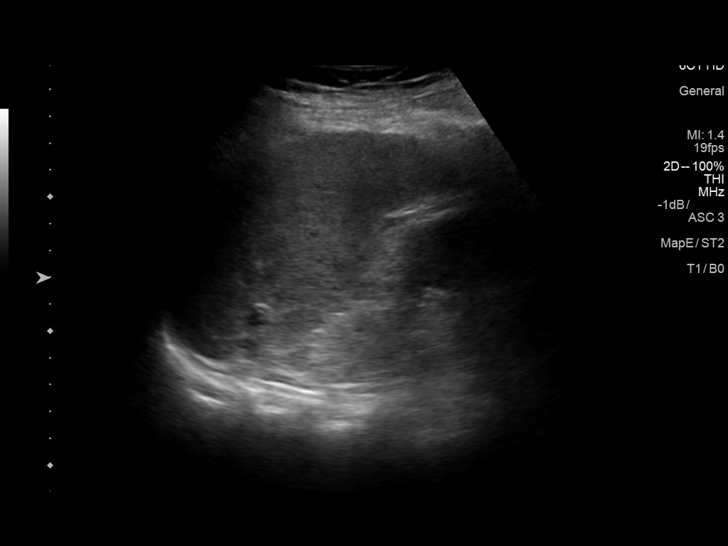
[im 65/112]
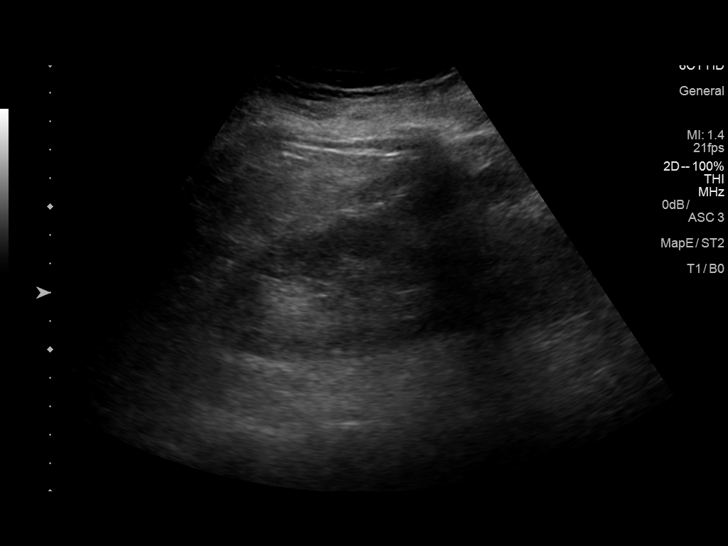
[im 75/112]
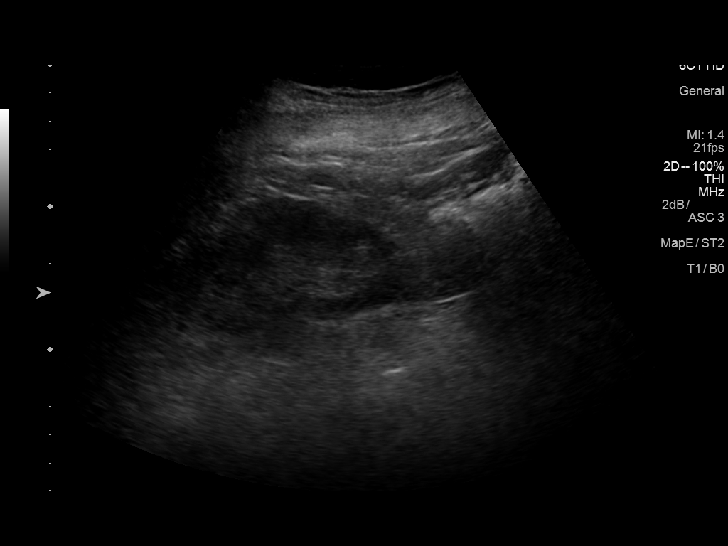
[im 84/112]
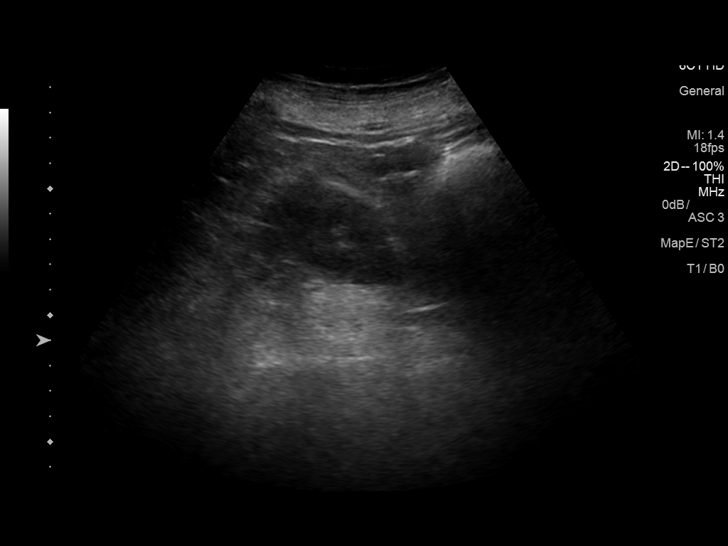
[im 93/112]
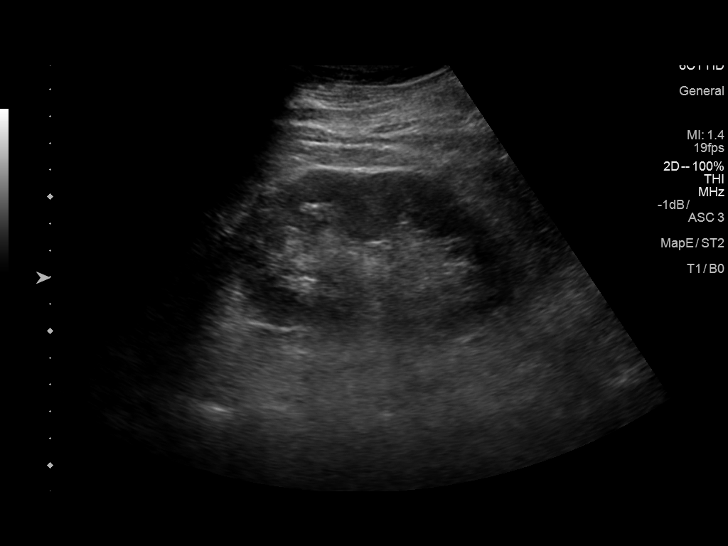
[im 102/112]
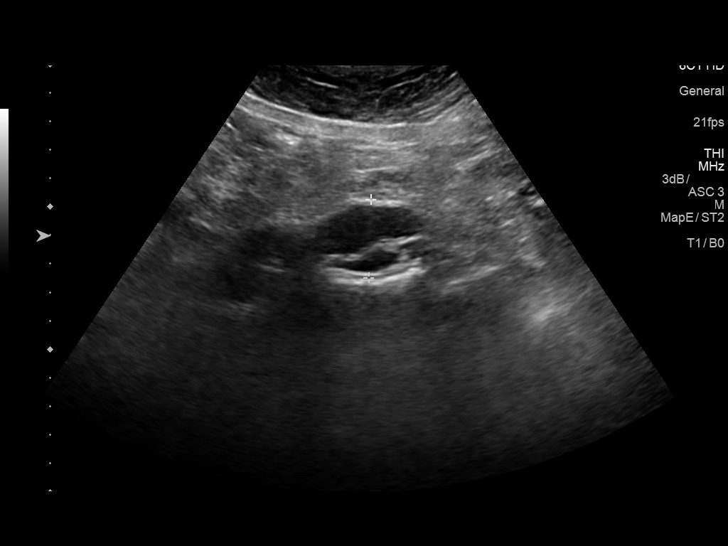
[im 112/112]
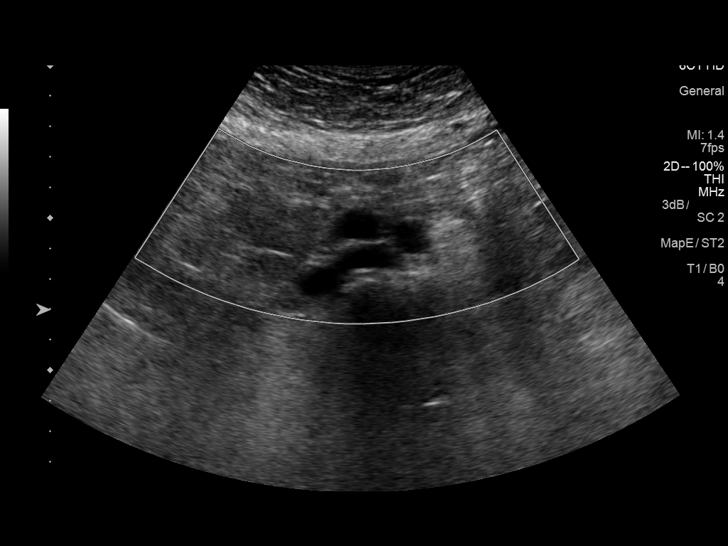

[13 of 25 positions shown; findings below may reference images not displayed]

FINDINGS: Gallbladder: No gallstones or wall thickening visualized. No
sonographic Murphy sign noted by sonographer.

Common bile duct: Diameter: 4.9 mm where visualized.

Liver: The hepatic echotexture is heterogeneously increased. The
surface contour is irregular. In the right lobe there is a mixed
echogenicity mass measuring 3.6 x 3.1 x 3.1 cm which distorts the
hepatic veins. The surface contour of the liver is irregular. There
is no intrahepatic ductal dilation. No flow is demonstrated in the
portal vein. Evaluation of the portal vein was technically limited.

IVC: Normal where visualized

Pancreas: Bowel gas limits visualization of the pancreas.

Spleen: 8.8 cm in length.

Right Kidney: Length: 10.4 cm. There is a midpole cortical cyst
measuring just under 1 cm in diameter. There is no hydronephrosis.

Left Kidney: Length: 11.5 cm. Echogenicity within normal limits. No
mass or hydronephrosis visualized.

Abdominal aorta: The maximal aortic diameter his approximately
cm. There is mural plaque.

Other findings: No ascites is observed.
IMPRESSION: Cirrhotic changes within the liver. Possible mass in the right lobe
measuring up to 3.6 cm in diameter. Possible no flow in the portal
vein but technical factors limit the study. Hepatic protocol MRI is
recommended.

No abdominal aortic aneurysm is observed. There is mural plaque
within the aorta.

## 2019-05-14 ENCOUNTER — Ambulatory Visit: Payer: Medicare Other | Attending: Internal Medicine

## 2019-05-14 DIAGNOSIS — Z23 Encounter for immunization: Secondary | ICD-10-CM | POA: Insufficient documentation

## 2019-05-14 NOTE — Progress Notes (Signed)
   Covid-19 Vaccination Clinic  Name:  Glen Finley    MRN: [id number] DOB: September 15, 1934  05/14/2019  Glen Finley was observed post Covid-19 immunization for 15 minutes without incidence. He was provided with Vaccine Information Sheet and instruction to access the V-Safe system.   Glen Finley was instructed to call 911 with any severe reactions post vaccine: Marland Kitchen Difficulty breathing  . Swelling of your face and throat  . A fast heartbeat  . A bad rash all over your body  . Dizziness and weakness    Immunizations Administered    Name Date Dose VIS Date Route   Pfizer COVID-19 Vaccine 05/14/2019 12:07 PM 0.3 mL 03/03/2019 Intramuscular   Manufacturer: [employer]   Lot: [id number]   [place]: [id number]

## 2019-06-06 ENCOUNTER — Ambulatory Visit: Payer: Medicare Other | Attending: Internal Medicine

## 2019-06-06 DIAGNOSIS — Z23 Encounter for immunization: Secondary | ICD-10-CM

## 2019-06-06 NOTE — Progress Notes (Signed)
   Covid-19 Vaccination Clinic  Name:  DARROLD BEZEK    MRN: [id number] DOB: Oct 09, 1934  06/06/2019  Mr. Schillaci was observed post Covid-19 immunization for 15 minutes without incident. He was provided with Vaccine Information Sheet and instruction to access the V-Safe system.   Mr. Zebrowski was instructed to call 911 with any severe reactions post vaccine: Marland Kitchen Difficulty breathing  . Swelling of face and throat  . A fast heartbeat  . A bad rash all over body  . Dizziness and weakness   Immunizations Administered    Name Date Dose VIS Date Route   Pfizer COVID-19 Vaccine 06/06/2019 11:09 AM 0.3 mL 03/03/2019 Intramuscular   Manufacturer: [employer]   Lot: [id number]   [place]: [id number]

## 2019-06-28 ENCOUNTER — Emergency Department (HOSPITAL_COMMUNITY): Payer: Medicare Other

## 2019-06-28 ENCOUNTER — Inpatient Hospital Stay (HOSPITAL_COMMUNITY): Payer: Medicare Other

## 2019-06-28 ENCOUNTER — Inpatient Hospital Stay (HOSPITAL_COMMUNITY)
Admission: EM | Admit: 2019-06-28 | Discharge: 2019-06-30 | DRG: 442 | Disposition: A | Discharge status: Home Health | Payer: Medicare Other | Attending: Family Medicine | Admitting: Family Medicine

## 2019-06-28 DIAGNOSIS — G7000 Myasthenia gravis without (acute) exacerbation: Secondary | ICD-10-CM | POA: Diagnosis present

## 2019-06-28 DIAGNOSIS — G43909 Migraine, unspecified, not intractable, without status migrainosus: Secondary | ICD-10-CM | POA: Diagnosis present

## 2019-06-28 DIAGNOSIS — K7290 Hepatic failure, unspecified without coma: Secondary | ICD-10-CM | POA: Diagnosis present

## 2019-06-28 DIAGNOSIS — R4182 Altered mental status, unspecified: Secondary | ICD-10-CM

## 2019-06-28 DIAGNOSIS — K219 Gastro-esophageal reflux disease without esophagitis: Secondary | ICD-10-CM | POA: Diagnosis present

## 2019-06-28 DIAGNOSIS — F329 Major depressive disorder, single episode, unspecified: Secondary | ICD-10-CM | POA: Diagnosis present

## 2019-06-28 DIAGNOSIS — Z885 Allergy status to narcotic agent status: Secondary | ICD-10-CM

## 2019-06-28 DIAGNOSIS — K7200 Acute and subacute hepatic failure without coma: Secondary | ICD-10-CM

## 2019-06-28 DIAGNOSIS — K7460 Unspecified cirrhosis of liver: Secondary | ICD-10-CM | POA: Diagnosis present

## 2019-06-28 DIAGNOSIS — R269 Unspecified abnormalities of gait and mobility: Secondary | ICD-10-CM | POA: Diagnosis present

## 2019-06-28 DIAGNOSIS — E8809 Other disorders of plasma-protein metabolism, not elsewhere classified: Secondary | ICD-10-CM | POA: Diagnosis present

## 2019-06-28 DIAGNOSIS — Z7982 Long term (current) use of aspirin: Secondary | ICD-10-CM

## 2019-06-28 DIAGNOSIS — I110 Hypertensive heart disease with heart failure: Secondary | ICD-10-CM | POA: Diagnosis present

## 2019-06-28 DIAGNOSIS — D689 Coagulation defect, unspecified: Secondary | ICD-10-CM | POA: Diagnosis present

## 2019-06-28 DIAGNOSIS — R404 Transient alteration of awareness: Secondary | ICD-10-CM | POA: Diagnosis not present

## 2019-06-28 DIAGNOSIS — R7989 Other specified abnormal findings of blood chemistry: Secondary | ICD-10-CM

## 2019-06-28 DIAGNOSIS — K7682 Hepatic encephalopathy: Secondary | ICD-10-CM

## 2019-06-28 DIAGNOSIS — R4189 Other symptoms and signs involving cognitive functions and awareness: Secondary | ICD-10-CM | POA: Diagnosis present

## 2019-06-28 DIAGNOSIS — I5032 Chronic diastolic (congestive) heart failure: Secondary | ICD-10-CM | POA: Diagnosis present

## 2019-06-28 DIAGNOSIS — D696 Thrombocytopenia, unspecified: Secondary | ICD-10-CM | POA: Diagnosis present

## 2019-06-28 DIAGNOSIS — Z79899 Other long term (current) drug therapy: Secondary | ICD-10-CM | POA: Diagnosis not present

## 2019-06-28 DIAGNOSIS — K7210 Chronic hepatic failure without coma: Secondary | ICD-10-CM | POA: Diagnosis present

## 2019-06-28 DIAGNOSIS — Z20822 Contact with and (suspected) exposure to covid-19: Secondary | ICD-10-CM | POA: Diagnosis present

## 2019-06-28 DIAGNOSIS — E785 Hyperlipidemia, unspecified: Secondary | ICD-10-CM | POA: Diagnosis present

## 2019-06-28 DIAGNOSIS — K760 Fatty (change of) liver, not elsewhere classified: Secondary | ICD-10-CM | POA: Diagnosis present

## 2019-06-28 DIAGNOSIS — Z9114 Patient's other noncompliance with medication regimen: Secondary | ICD-10-CM

## 2019-06-28 DIAGNOSIS — G9340 Encephalopathy, unspecified: Secondary | ICD-10-CM | POA: Diagnosis present

## 2019-06-28 LAB — COMPREHENSIVE METABOLIC PANEL
ALT: 33 U/L (ref 0–44)
AST: 77 U/L — ABNORMAL HIGH (ref 15–41)
Albumin: 2.8 g/dL — ABNORMAL LOW (ref 3.5–5.0)
Alkaline Phosphatase: 80 U/L (ref 38–126)
Anion gap: 11 (ref 5–15)
BUN: 18 mg/dL (ref 8–23)
CO2: 21 mmol/L — ABNORMAL LOW (ref 22–32)
Calcium: 9.3 mg/dL (ref 8.9–10.3)
Chloride: 111 mmol/L (ref 98–111)
Creatinine, Ser: 1.08 mg/dL (ref 0.61–1.24)
GFR calc Af Amer: >60 mL/min (ref >60)
GFR calc non Af Amer: >60 mL/min (ref >60)
Glucose, Bld: 112 mg/dL — ABNORMAL HIGH (ref 70–99)
Potassium: 4.4 mmol/L (ref 3.5–5.1)
Sodium: 143 mmol/L (ref 135–145)
Total Bilirubin: 2 mg/dL — ABNORMAL HIGH (ref 0.3–1.2)
Total Protein: 6.3 g/dL — ABNORMAL LOW (ref 6.5–8.1)

## 2019-06-28 LAB — CBC WITH DIFFERENTIAL/PLATELET
Abs Immature Granulocytes: 0.02 10*3/uL (ref 0.00–0.07)
Basophils Absolute: 0.1 10*3/uL (ref 0.0–0.1)
Basophils Relative: 1 %
Eosinophils Absolute: 0.3 10*3/uL (ref 0.0–0.5)
Eosinophils Relative: 5 %
HCT: 45.7 % (ref 39.0–52.0)
Hemoglobin: 15.8 g/dL (ref 13.0–17.0)
Immature Granulocytes: 0 %
Lymphocytes Relative: 37 %
Lymphs Abs: 2.8 10*3/uL (ref 0.7–4.0)
MCH: 34.1 pg — ABNORMAL HIGH (ref 26.0–34.0)
MCHC: 34.6 g/dL (ref 30.0–36.0)
MCV: 98.7 fL (ref 80.0–100.0)
Monocytes Absolute: 0.7 10*3/uL (ref 0.1–1.0)
Monocytes Relative: 9 %
Neutro Abs: 3.7 10*3/uL (ref 1.7–7.7)
Neutrophils Relative %: 48 %
Platelets: 105 10*3/uL — ABNORMAL LOW (ref 150–400)
RBC: 4.63 MIL/uL (ref 4.22–5.81)
RDW: 15.3 % (ref 11.5–15.5)
WBC: 7.5 10*3/uL (ref 4.0–10.5)
nRBC: 0 % (ref 0.0–0.2)

## 2019-06-28 LAB — URINALYSIS, COMPLETE (UACMP) WITH MICROSCOPIC
Bilirubin Urine: NEGATIVE
Glucose, UA: NEGATIVE mg/dL
Hgb urine dipstick: NEGATIVE
Ketones, ur: NEGATIVE mg/dL
Leukocytes,Ua: NEGATIVE
Nitrite: NEGATIVE
Protein, ur: 30 mg/dL — AB
Specific Gravity, Urine: 1.017 (ref 1.005–1.030)
pH: 6 (ref 5.0–8.0)

## 2019-06-28 LAB — PROTIME-INR
INR: 1.5 — ABNORMAL HIGH (ref 0.8–1.2)
Prothrombin Time: 17.9 seconds — ABNORMAL HIGH (ref 11.4–15.2)

## 2019-06-28 LAB — APTT: aPTT: 39 seconds — ABNORMAL HIGH (ref 24–36)

## 2019-06-28 LAB — CBG MONITORING, ED: Glucose-Capillary: 117 mg/dL — ABNORMAL HIGH (ref 70–99)

## 2019-06-28 LAB — AMMONIA: Ammonia: 171 umol/L — ABNORMAL HIGH (ref 9–35)

## 2019-06-28 MED ORDER — ATENOLOL 25 MG PO TABS
25.0000 mg | ORAL_TABLET | Freq: Every day | ORAL | Status: DC
  Administered 2019-06-29 – 2019-06-30 (×2): 25 mg via ORAL
  Filled 2019-06-28 (×2): qty 1

## 2019-06-28 MED ORDER — AMITRIPTYLINE HCL 25 MG PO TABS
25.0000 mg | ORAL_TABLET | Freq: Every day | ORAL | Status: DC
  Administered 2019-06-28 – 2019-06-29 (×2): 25 mg via ORAL
  Filled 2019-06-28 (×3): qty 1

## 2019-06-28 MED ORDER — LISINOPRIL 20 MG PO TABS: 40.0000 mg | ORAL_TABLET | Freq: Every day | ORAL | Status: DC

## 2019-06-28 MED ORDER — LISINOPRIL 20 MG PO TABS
20.0000 mg | ORAL_TABLET | Freq: Two times a day (BID) | ORAL | Status: DC
  Administered 2019-06-28: 20 mg via ORAL
  Filled 2019-06-28 (×2): qty 1

## 2019-06-28 MED ORDER — LACTULOSE 10 GM/15ML PO SOLN
20.0000 g | Freq: Once | ORAL | Status: AC
  Administered 2019-06-28: 20 g via ORAL
  Filled 2019-06-28: qty 30

## 2019-06-28 MED ORDER — FUROSEMIDE 20 MG PO TABS
20.0000 mg | ORAL_TABLET | Freq: Every day | ORAL | Status: DC
  Administered 2019-06-28 – 2019-06-30 (×3): 20 mg via ORAL
  Filled 2019-06-28 (×3): qty 1

## 2019-06-28 MED ORDER — ASPIRIN EC 81 MG PO TBEC
81.0000 mg | DELAYED_RELEASE_TABLET | Freq: Every day | ORAL | Status: DC
  Administered 2019-06-28 – 2019-06-29 (×2): 81 mg via ORAL
  Filled 2019-06-28 (×2): qty 1

## 2019-06-28 MED ORDER — ADULT MULTIVITAMIN W/MINERALS CH
1.0000 | ORAL_TABLET | Freq: Every day | ORAL | Status: DC
  Administered 2019-06-29 – 2019-06-30 (×2): 1 via ORAL
  Filled 2019-06-28 (×2): qty 1

## 2019-06-28 MED ORDER — OMEGA-3 FATTY ACIDS 1000 MG PO CAPS: 1.0000 g | ORAL_CAPSULE | Freq: Two times a day (BID) | ORAL | Status: DC

## 2019-06-28 MED ORDER — AMLODIPINE BESYLATE 5 MG PO TABS: 10.0000 mg | ORAL_TABLET | Freq: Every day | ORAL | Status: DC

## 2019-06-28 MED ORDER — LACTULOSE 10 GM/15ML PO SOLN
10.0000 g | Freq: Three times a day (TID) | ORAL | Status: DC
  Administered 2019-06-29 – 2019-06-30 (×5): 10 g via ORAL
  Filled 2019-06-28 (×7): qty 15

## 2019-06-28 MED ORDER — RIFAXIMIN 550 MG PO TABS
550.0000 mg | ORAL_TABLET | Freq: Two times a day (BID) | ORAL | Status: DC
  Administered 2019-06-29: 550 mg via ORAL
  Filled 2019-06-28 (×2): qty 1

## 2019-06-28 MED ORDER — ESCITALOPRAM OXALATE 10 MG PO TABS
10.0000 mg | ORAL_TABLET | Freq: Every day | ORAL | Status: DC
  Administered 2019-06-29 – 2019-06-30 (×2): 10 mg via ORAL
  Filled 2019-06-28 (×2): qty 1

## 2019-06-28 MED ORDER — AMITRIPTYLINE HCL 25 MG PO TABS: 25.0000 mg | ORAL_TABLET | Freq: Every day | ORAL | Status: DC

## 2019-06-28 MED ORDER — HEPARIN SODIUM (PORCINE) 5000 UNIT/ML IJ SOLN
5000.0000 [IU] | Freq: Two times a day (BID) | INTRAMUSCULAR | Status: DC
  Administered 2019-06-28 – 2019-06-30 (×4): 5000 [IU] via SUBCUTANEOUS
  Filled 2019-06-28 (×4): qty 1

## 2019-06-28 MED ORDER — HYDRALAZINE HCL 10 MG PO TABS
10.0000 mg | ORAL_TABLET | Freq: Three times a day (TID) | ORAL | Status: DC
  Administered 2019-06-29 – 2019-06-30 (×5): 10 mg via ORAL
  Filled 2019-06-28 (×8): qty 1

## 2019-06-28 MED ORDER — VITAMIN D 25 MCG (1000 UNIT) PO TABS
1000.0000 [IU] | ORAL_TABLET | Freq: Every day | ORAL | Status: DC
  Administered 2019-06-29 – 2019-06-30 (×2): 1000 [IU] via ORAL
  Filled 2019-06-28 (×2): qty 1

## 2019-06-28 NOTE — ED Notes (Signed)
Received report from [employer]

## 2019-06-28 NOTE — ED Notes (Signed)
Pt's Wife: Marqueze Ramcharan can be reached at [phone]

## 2019-06-28 NOTE — H&P (Signed)
History and Physical    Glen Finley OQH:[id number] DOB: [birth year] DOA: 06/28/2019  PCP: Shon Baton, MD (Confirm with patient/family/NH records and if not entered, this has to be entered at [facility] point of entry) Patient coming from: Confusion  I have personally briefly reviewed patient's old medical records in [place]  Chief Complaint: Confusion  HPI: Glen Finley is a 85 y.o. male with medical history significant of hypertension, hyperlipidemia, cirrhosis, recurrent hepatic encephalopathy, presented to the ED for worsening confusion over the last 2 to 3 weeks.  Majority of history is provided by wife at the bedside.  Patient started to take lactulose 30 mg at bedtime for about 6 months prescribed by his GI doctor at [facility].  He used to tolerated well, until few weeks ago, when he started to have large watery diarrhea about 6 to 7 hours after he took lactulose and night.  He contacted his GI doctor who recommended he cut down his lactulose from 30 g to 10 g every night.  After the change, patient has average 1 bowel movement a day, and diarrhea improved. Patient has not had any recent falls or injuries.    Patient was on rifaximin briefly last year, then retreated because of insurance issue.  Patient denied any abdominal pain, no nausea vomit, no fever chills. ED Course: ammonia level of 171, T bili of 2.  AST mildly elevated at 77.  CBC is unremarkable.  Chest x-ray is negative for acute abnormality.  Urinalysis with some bacteria, sent for culture as patient is asymptomatic.  CT of the head shows no acute findings.    Review of Systems: Unable to perform, patient confused.    Past Medical History:  Diagnosis Date  . Abnormality of gait   . GERD (gastroesophageal reflux disease)   . Headache(784.0)   . Hyperlipidemia   . Hypertension   . Migraine   . Myasthenia gravis without exacerbation ([place])   . Obesity   . Other extrapyramidal disease and abnormal movement disorder     . REM sleep behavior disorder   . Unspecified psychosis   . Weakness     Past Surgical History:  Procedure Laterality Date  . CATARACT EXTRACTION Bilateral      reports that he has never smoked. He has never used smokeless tobacco. He reports that he does not drink alcohol or use drugs.  Allergies  Allergen Reactions  . Codeine Rash    Family History  Problem Relation Age of Onset  .  () Unknown      Prior to Admission medications   Medication Sig Start Date End Date Taking? Authorizing Provider  alendronate (FOSAMAX) 70 MG tablet once a week. 12/17/14   [provider]  amitriptyline (ELAVIL) 50 MG tablet 25 mg daily. 1/2 tab for 25mg  PO HS prn sleep.  If we can - we may Discontinue this medicine. 08/12/11   Shon Baton, MD  amLODipine (NORVASC) 10 MG tablet Take 10 mg by mouth daily.  02/21/18   [provider]  aspirin EC 81 MG tablet Take 81 mg by mouth at bedtime.    [provider]  atenolol (TENORMIN) 25 MG tablet 25 mg daily. 07/04/12   [provider]  cholecalciferol (VITAMIN D) 1000 UNITS tablet Take 1,000 Units by mouth daily.    [provider]  escitalopram (LEXAPRO) 10 MG tablet Take 10 mg by mouth daily. 02/13/14   [provider]  fish oil-omega-3 fatty acids 1000 MG  capsule Take 1 g by mouth 2 (two) times daily.    [provider]  furosemide (LASIX) 20 MG tablet Take 20 mg by mouth daily.  01/03/17   [provider]  lactulose (CHRONULAC) 10 GM/15ML solution TK 30 ML PO Q 3 DAYS 01/07/18   [provider]  lisinopril (PRINIVIL,ZESTRIL) 40 MG tablet Take 40 mg by mouth 2 (two) times daily.     [provider]  Multiple Vitamins-Minerals (MULTIVITAMINS THER. W/MINERALS) TABS Take 1 tablet by mouth daily.    [provider]    Physical Exam: Vitals:   06/28/19 1630 06/28/19 1645 06/28/19 1700 06/28/19 1715  BP: (!) 155/68 (!) 171/63 (!) 173/67 (!) 186/74  Pulse:  65 65 64 66  Resp: (!) 23 (!) 23 (!) 27 (!) 27  Temp:      TempSrc:      SpO2: 95% 94% 94% 96%    Constitutional: NAD, calm, comfortable Vitals:   06/28/19 1630 06/28/19 1645 06/28/19 1700 06/28/19 1715  BP: (!) 155/68 (!) 171/63 (!) 173/67 (!) 186/74  Pulse: 65 65 64 66  Resp: (!) 23 (!) 23 (!) 27 (!) 27  Temp:      TempSrc:      SpO2: 95% 94% 94% 96%   Eyes: PERRL, lids and conjunctivae normal ENMT: Mucous membranes are moist. Posterior pharynx clear of any exudate or lesions.Normal dentition.  Neck: normal, supple, no masses, no thyromegaly Respiratory: clear to auscultation bilaterally, no wheezing, no crackles. Normal respiratory effort. No accessory muscle use.  Cardiovascular: Regular rate and rhythm, no murmurs / rubs / gallops. No extremity edema. 2+ pedal pulses. No carotid bruits.  Abdomen: no tenderness, no masses palpated. No hepatosplenomegaly. Bowel sounds positive.  Negative for ascites sign Musculoskeletal: no clubbing / cyanosis. No joint deformity upper and lower extremities. Good ROM, no contractures. Normal muscle tone.  Skin: no rashes, lesions, ulcers. No induration Neurologic: Confused, flap tremors on fingertips Psychiatric: Confided. Normal mood.     Labs on Admission: I have personally reviewed following labs and imaging studies  CBC: Recent Labs  Lab 06/28/19 1531  WBC 7.5  NEUTROABS 3.7  HGB 15.8  HCT 45.7  MCV 98.7  PLT 124*   Basic Metabolic Panel: Recent Labs  Lab 06/28/19 1531  NA 143  K 4.4  CL 111  CO2 21*  GLUCOSE 112*  BUN 18  CREATININE 1.08  CALCIUM 9.3   GFR: CrCl cannot be calculated (Unknown ideal weight.). Liver Function Tests: Recent Labs  Lab 06/28/19 1531  AST 77*  ALT 33  ALKPHOS 80  BILITOT 2.0*  PROT 6.3*  ALBUMIN 2.8*   No results for input(s): LIPASE, AMYLASE in the last 168 hours. Recent Labs  Lab 06/28/19 1532  AMMONIA 171*   Coagulation Profile: Recent Labs  Lab 06/28/19 1531  INR 1.5*    Cardiac Enzymes: No results for input(s): CKTOTAL, CKMB, CKMBINDEX, TROPONINI in the last 168 hours. BNP (last 3 results) No results for input(s): PROBNP in the last 8760 hours. HbA1C: No results for input(s): HGBA1C in the last 72 hours. CBG: Recent Labs  Lab 06/28/19 1546  GLUCAP 117*   Lipid Profile: No results for input(s): CHOL, HDL, LDLCALC, TRIG, CHOLHDL, LDLDIRECT in the last 72 hours. Thyroid Function Tests: No results for input(s): TSH, T4TOTAL, FREET4, T3FREE, THYROIDAB in the last 72 hours. Anemia Panel: No results for input(s): VITAMINB12, FOLATE, FERRITIN, TIBC, IRON, RETICCTPCT in the last 72 hours. Urine analysis:    Component  Value Date/Time   COLORURINE AMBER (A) 06/28/2019 1700   APPEARANCEUR CLEAR 06/28/2019 1700   LABSPEC 1.017 06/28/2019 1700   PHURINE 6.0 06/28/2019 1700   GLUCOSEU NEGATIVE 06/28/2019 1700   HGBUR NEGATIVE 06/28/2019 1700   BILIRUBINUR NEGATIVE 06/28/2019 1700   KETONESUR NEGATIVE 06/28/2019 1700   PROTEINUR 30 (A) 06/28/2019 1700   UROBILINOGEN 1.0 08/10/2011 1826   NITRITE NEGATIVE 06/28/2019 1700   LEUKOCYTESUR NEGATIVE 06/28/2019 1700    Radiological Exams on Admission: CT Head Wo Contrast  Result Date: 06/28/2019 CLINICAL DATA:  Confusion. Increased confusion over the past 2 days. EXAM: CT HEAD WITHOUT CONTRAST TECHNIQUE: Contiguous axial images were obtained from the base of the skull through the vertex without intravenous contrast. COMPARISON:  Head CT and brain MRI May 2013 FINDINGS: Brain: No intracranial hemorrhage, mass effect, or midline shift. Age related atrophy. No hydrocephalus. The basilar cisterns are patent. Moderate to advanced chronic small vessel ischemia. No evidence of territorial infarct or acute ischemia. No extra-axial or intracranial fluid collection. Vascular: Atherosclerosis of skullbase vasculature without hyperdense vessel or abnormal calcification. Skull: No fracture or focal lesion. Sinuses/Orbits:  Complete opacification of left side of sphenoid sinus with high-density material. Opacification of posterior left ethmoid air cells. There is some surrounding bony sclerosis. Mild mucosal thickening of right side of sphenoid sinus. Mastoid air cells are clear. Bilateral cataract resection. Other: None. IMPRESSION: 1. No acute intracranial abnormality. 2. Age related atrophy. Moderate to advanced chronic small vessel ischemia. 3. Opacification of left sphenoid sinus and posterior ethmoid air cells with high density material. There is adjacent bony sclerosis suggesting this is chronic sinusitis, however was not seen on 2013 exam. Electronically Signed   By: Keith Rake M.D.   On: 06/28/2019 17:47   DG Chest Port 1 View  Result Date: 06/28/2019 CLINICAL DATA:  Increased confusion, cirrhosis EXAM: PORTABLE CHEST 1 VIEW COMPARISON:  None. FINDINGS: No consolidation or edema. No pleural effusion or pneumothorax. Cardiomediastinal contours are within normal limits for portable technique. There is calcified plaque along the aortic arch. IMPRESSION: No acute process in the chest. Electronically Signed   By: Macy Mis M.D.   On: 06/28/2019 15:49    EKG: Independently reviewed.   Assessment/Plan Active Problems:   Acute hepatic encephalopathy   Acute hepatic encephalopathy Likely from noncompliance with lactulose, discussed with patient and his wife at bedside, agreed to resume his routine lactulose regimen, patient used to take lactulose before he sleeps, recommend patient space out lactulose to 2-3 times a day to titrate bowel movement 2-3 times a day.  Patient and wife agreed. Add rifaximin, but affordability will be a issue. We will check AFP Check RUQ U/S  Acute on chronic liver failure Evidenced by encephalopathy, coagulopathy, hypoalbuminemia, and thrombocytopenia Conservative management  Hypertension Controlled, continue beta-blocker and lisinopril and Lasix    DVT prophylaxis:  Heparin subcu Code Status: Full code Family Communication: Wife at bedside Disposition Plan: Likely can be discharged 2 to 3 days depends on clinical progress Consults called: None Admission status: Telemetry admission   Lequita Halt MD [facility] Pager [phone]   06/28/2019, 6:47 PM

## 2019-06-28 NOTE — ED Provider Notes (Signed)
Medical screening examination/treatment/procedure(s) were conducted as a shared visit with non-physician practitioner(s) and myself.  I personally evaluated the patient during the encounter.    Patient has had unsteady gait.  Also recent confusion.  He had several falls over a month ago but none recently.  He has cut back on his lactulose dose because of been having difficulty getting to the bathroom before having incontinence of stool.  Patient is alert but seems slightly fatigued.  He interacts and answers questions appropriately.  Normocephalic atraumatic.  He has a little bit of dried blood in the right nare.  Airway patent.  Heart regular.  Lungs grossly clear.  He follows commands to elevate extremities and hold against resistance.  Skin warm and dry.  We will also proceed with CT head to rule out any occult subdural or other indolent neurologic process.  Suspect elevated ammonia as primary cause of imbalance and confusion.  Lan for admission.   Charlesetta Shanks, MD 06/28/19 1701

## 2019-06-28 NOTE — ED Provider Notes (Signed)
[facility] EMERGENCY DEPARTMENT Provider Note   CSN: [id number] Arrival date & time: 06/28/19  1517     History Chief Complaint  Patient presents with  . Altered Mental Status    Glen Finley is a 85 y.o. male with a past medical history of hypertension, hyperlipidemia, cirrhosis presenting to the ED with a chief complaint of altered mental status and increased confusion.  Majority of history is provided by wife at the bedside. States that patient will have intermittent episodes of confusion and generalized weakness over the past few months.  Usually the symptoms improve. Since 06/26/2019, patient symptoms have been more profound and increased confusion.  Patient takes lactulose every day for his cirrhosis.  She did note that over the past few weeks they have gradually decreased his lactulose dose to 10 mL every day.  He was told to take 30 mL every 3 days but did have diarrhea with this so they decreased it at their own discretion.  Patient has not had any recent falls or injuries.  Wife is concerned that his ammonia levels may be high.  Patient denies any chest pain, abdominal pain, vomiting, diarrhea, headache, vision changes, numbness in arms or legs or anticoagulant use. No fevers.   HPI     Past Medical History:  Diagnosis Date  . Abnormality of gait   . GERD (gastroesophageal reflux disease)   . Headache(784.0)   . Hyperlipidemia   . Hypertension   . Migraine   . Myasthenia gravis without exacerbation ([place])   . Obesity   . Other extrapyramidal disease and abnormal movement disorder   . REM sleep behavior disorder   . Unspecified psychosis   . Weakness     Patient Active Problem List   Diagnosis Date Noted  . Sleep apnea 03/07/2015  . Headache(784.0)   . GERD (gastroesophageal reflux disease)   . Obesity   . REM sleep behavior disorder   . Other extrapyramidal disease and abnormal movement disorder   . Myasthenia gravis without exacerbation ([place])    . Abnormality of gait   . Unspecified psychosis   . Weakness   . Dysarthria 08/10/2011    Class: Acute  . Gait instability 08/10/2011    Class: Acute  . Hypertension 08/10/2011    Class: Chronic  . Hyperlipidemia 08/10/2011    Class: Chronic    Past Surgical History:  Procedure Laterality Date  . CATARACT EXTRACTION Bilateral        Family History  Problem Relation Age of Onset  .  () Unknown     Social History   Tobacco Use  . Smoking status: Never Smoker  . Smokeless tobacco: Never Used  Substance Use Topics  . Alcohol use: No    Alcohol/week: 0.0 standard drinks  . Drug use: No    Home Medications Prior to Admission medications   Medication Sig Start Date End Date Taking? Authorizing Provider  alendronate (FOSAMAX) 70 MG tablet once a week. 12/17/14   [provider]  amitriptyline (ELAVIL) 50 MG tablet 25 mg daily. 1/2 tab for 25mg  PO HS prn sleep.  If we can - we may Discontinue this medicine. 08/12/11   Shon Baton, MD  amLODipine (NORVASC) 10 MG tablet Take 10 mg by mouth daily.  02/21/18   [provider]  aspirin EC 81 MG tablet Take 81 mg by mouth at bedtime.    [provider]  atenolol (TENORMIN) 25 MG tablet 25 mg daily. 07/04/12  [provider]  cholecalciferol (VITAMIN D) 1000 UNITS tablet Take 1,000 Units by mouth daily.    [provider]  escitalopram (LEXAPRO) 10 MG tablet Take 10 mg by mouth daily. 02/13/14   [provider]  fish oil-omega-3 fatty acids 1000 MG capsule Take 1 g by mouth 2 (two) times daily.    [provider]  furosemide (LASIX) 20 MG tablet Take 20 mg by mouth daily.  01/03/17   [provider]  lactulose (CHRONULAC) 10 GM/15ML solution TK 30 ML PO Q 3 DAYS 01/07/18   [provider]  lisinopril (PRINIVIL,ZESTRIL) 40 MG tablet Take 40 mg by mouth 2 (two) times daily.     [provider]  Multiple Vitamins-Minerals (MULTIVITAMINS THER.  W/MINERALS) TABS Take 1 tablet by mouth daily.    [provider]    Allergies    Codeine  Review of Systems   Review of Systems  Constitutional: Negative for appetite change, chills and fever.  HENT: Negative for ear pain, rhinorrhea, sneezing and sore throat.   Eyes: Negative for photophobia and visual disturbance.  Respiratory: Negative for cough, chest tightness, shortness of breath and wheezing.   Cardiovascular: Negative for chest pain and palpitations.  Gastrointestinal: Negative for abdominal pain, blood in stool, constipation, diarrhea, nausea and vomiting.  Genitourinary: Negative for dysuria, hematuria and urgency.  Musculoskeletal: Negative for myalgias.  Skin: Negative for rash.  Neurological: Negative for dizziness, weakness and light-headedness.  Psychiatric/Behavioral: Positive for confusion.    Physical Exam Updated Vital Signs BP (!) 185/81 (BP Location: Right Arm)   Pulse 72   Temp 98.7 F (37.1 C) (Oral)   Resp 20   SpO2 97%   Physical Exam Vitals and nursing note reviewed.  Constitutional:      General: He is not in acute distress.    Appearance: He is well-developed.  HENT:     Head: Normocephalic and atraumatic.     Nose: Nose normal.  Eyes:     General: No scleral icterus.       Right eye: No discharge.        Left eye: No discharge.     Conjunctiva/sclera: Conjunctivae normal.     Pupils: Pupils are equal, round, and reactive to light.  Cardiovascular:     Rate and Rhythm: Normal rate and regular rhythm.     Heart sounds: Normal heart sounds. No murmur. No friction rub. No gallop.   Pulmonary:     Effort: Pulmonary effort is normal. No respiratory distress.     Breath sounds: Normal breath sounds.  Abdominal:     General: Bowel sounds are normal. There is no distension.     Palpations: Abdomen is soft.     Tenderness: There is no abdominal tenderness. There is no guarding.  Musculoskeletal:        General: Normal range of  motion.     Cervical back: Normal range of motion and neck supple.  Skin:    General: Skin is warm and dry.     Findings: No rash.  Neurological:     Mental Status: He is alert.     Motor: No abnormal muscle tone.     Coordination: Coordination normal.     Comments: Patient alert to self, place, situation and current events.  Unsure of year.  Able to name his wife in the room. Pupils reactive. No facial asymmetry noted. Cranial nerves appear grossly intact. Sensation intact to light touch on face, BUE and BLE.  Strength 5/5 in BUE and BLE.     ED Results / Procedures / Treatments   Labs (all labs ordered are listed, but only abnormal results are displayed) Labs Reviewed  COMPREHENSIVE METABOLIC PANEL - Abnormal; Notable for the following components:      Result Value   CO2 21 (*)    Glucose, Bld 112 (*)    Total Protein 6.3 (*)    Albumin 2.8 (*)    AST 77 (*)    Total Bilirubin 2.0 (*)    All other components within normal limits  CBC WITH DIFFERENTIAL/PLATELET - Abnormal; Notable for the following components:   MCH 34.1 (*)    Platelets 105 (*)    All other components within normal limits  URINALYSIS, COMPLETE (UACMP) WITH MICROSCOPIC - Abnormal; Notable for the following components:   Color, Urine AMBER (*)    Protein, ur 30 (*)    Bacteria, UA RARE (*)    All other components within normal limits  AMMONIA - Abnormal; Notable for the following components:   Ammonia 171 (*)    All other components within normal limits  PROTIME-INR - Abnormal; Notable for the following components:   Prothrombin Time 17.9 (*)    INR 1.5 (*)    All other components within normal limits  APTT - Abnormal; Notable for the following components:   aPTT 39 (*)    All other components within normal limits  CBG MONITORING, ED - Abnormal; Notable for the following components:   Glucose-Capillary 117 (*)    All other components within normal limits  URINE CULTURE  SARS CORONAVIRUS 2 (TAT 6-24  HRS)    EKG None  Radiology CT Head Wo Contrast  Result Date: 06/28/2019 CLINICAL DATA:  Confusion. Increased confusion over the past 2 days. EXAM: CT HEAD WITHOUT CONTRAST TECHNIQUE: Contiguous axial images were obtained from the base of the skull through the vertex without intravenous contrast. COMPARISON:  Head CT and brain MRI May 2013 FINDINGS: Brain: No intracranial hemorrhage, mass effect, or midline shift. Age related atrophy. No hydrocephalus. The basilar cisterns are patent. Moderate to advanced chronic small vessel ischemia. No evidence of territorial infarct or acute ischemia. No extra-axial or intracranial fluid collection. Vascular: Atherosclerosis of skullbase vasculature without hyperdense vessel or abnormal calcification. Skull: No fracture or focal lesion. Sinuses/Orbits: Complete opacification of left side of sphenoid sinus with high-density material. Opacification of posterior left ethmoid air cells. There is some surrounding bony sclerosis. Mild mucosal thickening of right side of sphenoid sinus. Mastoid air cells are clear. Bilateral cataract resection. Other: None. IMPRESSION: 1. No acute intracranial abnormality. 2. Age related atrophy. Moderate to advanced chronic small vessel ischemia. 3. Opacification of left sphenoid sinus and posterior ethmoid air cells with high density material. There is adjacent bony sclerosis suggesting this is chronic sinusitis, however was not seen on 2013 exam. Electronically Signed   By: Keith Rake M.D.   On: 06/28/2019 17:47   DG Chest Port 1 View  Result Date: 06/28/2019 CLINICAL DATA:  Increased confusion, cirrhosis EXAM: PORTABLE CHEST 1 VIEW COMPARISON:  None. FINDINGS: No consolidation or edema. No pleural effusion or pneumothorax. Cardiomediastinal contours are within normal limits for portable technique. There is calcified plaque along the aortic arch. IMPRESSION: No acute process in the chest. Electronically Signed   By: Macy Mis  M.D.   On: 06/28/2019 15:49    Procedures Procedures (including critical care time)  Medications Ordered in ED Medications  lactulose (CHRONULAC) 10 GM/15ML solution  20 g (20 g Oral Given 06/28/19 1721)    ED Course  I have reviewed the triage vital signs and the nursing notes.  Pertinent labs & imaging results that were available during my care of the patient were reviewed by me and considered in my medical decision making (see chart for details).  Clinical Course as of Jun 27 1799  Wed Jun 28, 2019  1646 Ammonia(!): 171 [HK]    Clinical Course User Index [HK] Delia Heady, [place]   MDM Rules/Calculators/A&P                      [phone] M with a past medical history of hypertension, hyperlipidemia, cirrhosis presenting to the ED with a chief complaint of altered mental status and increased confusion.  Wife states that he has had these intermittent episodes of confusion and generalized weakness for the past few months which she attributed to a high ammonia level.  Patient is post to be on lactulose every day.  He is supposed to take 30 cc every 3 days but has been gradually decreasing the doses due to diarrhea.  He currently takes 10 cc every day.  Last dose was yesterday.  Denies any injuries or falls recently.  Patient is alert and oriented to self, place, situation current events.  Unable to tell me the year.  No neurological deficits noted.  He is afebrile.  Work appear significant for ammonia level of 171, T bili of 2.  AST mildly elevated at 77.  CBC is unremarkable.  Chest x-ray is negative for acute abnormality.  Urinalysis with some bacteria, sent for culture as patient is asymptomatic.  CT of the head shows no acute findings.  Suspect that his symptoms are due to hepatic encephalopathy.  Will give patient dose of lactulose here.  Feel that he will benefit from admission for ongoing management, continued treatment until he is back to his baseline. Patient and wife updated on plan and are  agreeable for admission. We will consult hospitalist.  MDM Number of Diagnoses or Management Options Hepatic encephalopathy ([place]): new, needed workup   Amount and/or Complexity of Data Reviewed Clinical lab tests: ordered and reviewed Tests in the radiology section of CPT: ordered and reviewed Review and summarize past medical records: yes Discuss the patient with other providers: yes  Risk of Complications, Morbidity, and/or Mortality Presenting problems: high Diagnostic procedures: high Management options: high  Patient Progress Patient progress: stable   Final Clinical Impression(s) / ED Diagnoses Final diagnoses:  Hepatic encephalopathy ([place])    Rx / DC Orders ED Discharge Orders    None      Portions of this note were generated with Dragon dictation software. Dictation errors may occur despite best attempts at proofreading.    Delia Heady, PA-C 06/28/19 1801    Charlesetta Shanks, MD 06/28/19 2151

## 2019-06-28 NOTE — ED Triage Notes (Signed)
Pt from home via EMS. Wife called due to increased confusion since Monday. Pt with hx of cirrhosis and elevated ammonia levels, seems like similar behavior as he's had in past. Pt A+Ox2 but unable to tell year on scene.  191/89, 70 HR, 95% RA, CBG 93.

## 2019-06-29 ENCOUNTER — Encounter (HOSPITAL_COMMUNITY): Payer: Self-pay | Admitting: Internal Medicine

## 2019-06-29 ENCOUNTER — Other Ambulatory Visit: Payer: Self-pay

## 2019-06-29 LAB — COMPREHENSIVE METABOLIC PANEL
ALT: 36 U/L (ref 0–44)
AST: 108 U/L — ABNORMAL HIGH (ref 15–41)
Albumin: 2.9 g/dL — ABNORMAL LOW (ref 3.5–5.0)
Alkaline Phosphatase: 71 U/L (ref 38–126)
Anion gap: 11 (ref 5–15)
BUN: 17 mg/dL (ref 8–23)
CO2: 24 mmol/L (ref 22–32)
Calcium: 9 mg/dL (ref 8.9–10.3)
Chloride: 109 mmol/L (ref 98–111)
Creatinine, Ser: 1.01 mg/dL (ref 0.61–1.24)
GFR calc Af Amer: >60 mL/min (ref >60)
GFR calc non Af Amer: >60 mL/min (ref >60)
Glucose, Bld: 96 mg/dL (ref 70–99)
Potassium: 4.9 mmol/L (ref 3.5–5.1)
Sodium: 144 mmol/L (ref 135–145)
Total Bilirubin: 2.7 mg/dL — ABNORMAL HIGH (ref 0.3–1.2)
Total Protein: 6.5 g/dL (ref 6.5–8.1)

## 2019-06-29 LAB — PROTIME-INR
INR: 1.4 — ABNORMAL HIGH (ref 0.8–1.2)
Prothrombin Time: 16.7 seconds — ABNORMAL HIGH (ref 11.4–15.2)

## 2019-06-29 LAB — SARS CORONAVIRUS 2 (TAT 6-24 HRS): SARS Coronavirus 2: NEGATIVE

## 2019-06-29 LAB — AMMONIA: Ammonia: 45 umol/L — ABNORMAL HIGH (ref 9–35)

## 2019-06-29 NOTE — ED Notes (Signed)
Patient found by housekeeping urinating on the floor. Directed back to bed. Patient gown and socks changed and this NT placed a condom cath on patient for patient safety and accurate intake and output.

## 2019-06-29 NOTE — Plan of Care (Signed)

## 2019-06-29 NOTE — [place] Notes (Signed)
P Acute hepatic encephalopathy in a setting of probable fatty liver disease, cirrhosis Documented noncompliance from wife at bedside usually takes only once nightly-schedule will be reinforced Follow AFP,  RUQ [place] 4/7 shows cirrhosis without any ductal dilatation Ammonia has improved to 45-stop checking and once mentation improves we will engage in therapy as he had a fall earlier this am while getting to RR   Cirrhosis liver Continue Lasix 20 as as needed-continue atenolol 25 AM Outpatient screening varices as indicated  Myasthenia gravis, progressive gait disorder Does not seem to be on any medications-outpatient acc to wife no eveidence could be found, so Dr. Krista Blue released him to PCP for further care  HTN For now holding lisinopril 20 twice daily, atenolol as above  Depression Continue amitriptyline 25 at bedtime, Lexapro 10  HFpEF EF 60-65% Continue Lasix as above-lisinopril held from admission and resume on discharge    S 85 white male known progressive gait disorder with apraxia?  Parkinson plus VS Lewy body disease, bipolar, fatty liver history of LFT elevation 2013, migraine, hyperlipidemia, HTN, reflux, HFpEF last echo 60-65%?  Aortic root dilatation 4.4 cm follows with Dr. Ian Malkin under treatment Dr. Krista Blue probable OSA Admitted from [facility] ED for 721 increasing confusion was awake oriented x2 but unable to tell the year vital signs other than elevated blood pressure relatively stable Ammonia 170 1T bili 2 AST 77, UA?  Bacteria and CXR CT head negative   Awake pleasant in nad mild cognitive deficits, no cp f chill  cta b no adde dsound mallampatti 2 abd soft No asterixis--moves limbs x 4 approp to power  O BP (!) 163/69   Pulse 68   Temp 97.7 F (36.5 C) (Oral)   Resp 18   Ht 5\' 9"  (1.753 m)   Wt 87.7 kg   SpO2 95%   BMI 28.55 kg/m  Sodium 144 potassium 4.4-->4.5 BUN/creatinine 17/1.01 Albumin 2.9 AST/ALT 77/33-->108/36 Ammonia 171-45 Hemoglobin  15.8 WBC 7.5 platelet 105 INR 1.4

## 2019-06-30 LAB — COMPREHENSIVE METABOLIC PANEL
ALT: 28 U/L (ref 0–44)
AST: 64 U/L — ABNORMAL HIGH (ref 15–41)
Albumin: 2.7 g/dL — ABNORMAL LOW (ref 3.5–5.0)
Alkaline Phosphatase: 63 U/L (ref 38–126)
Anion gap: 12 (ref 5–15)
BUN: 23 mg/dL (ref 8–23)
CO2: 23 mmol/L (ref 22–32)
Calcium: 8.7 mg/dL — ABNORMAL LOW (ref 8.9–10.3)
Chloride: 105 mmol/L (ref 98–111)
Creatinine, Ser: 1.12 mg/dL (ref 0.61–1.24)
GFR calc Af Amer: >60 mL/min (ref >60)
GFR calc non Af Amer: >60 mL/min (ref >60)
Glucose, Bld: 102 mg/dL — ABNORMAL HIGH (ref 70–99)
Potassium: 3.6 mmol/L (ref 3.5–5.1)
Sodium: 140 mmol/L (ref 135–145)
Total Bilirubin: 3.2 mg/dL — ABNORMAL HIGH (ref 0.3–1.2)
Total Protein: 6.3 g/dL — ABNORMAL LOW (ref 6.5–8.1)

## 2019-06-30 LAB — AFP TUMOR MARKER: AFP, Serum, Tumor Marker: 7.2 ng/mL (ref 0.0–8.3)

## 2019-06-30 MED ORDER — LACTULOSE 10 GM/15ML PO SOLN: 10.0000 g | Freq: Two times a day (BID) | ORAL | 2 refills | Status: DC

## 2019-06-30 MED ORDER — LISINOPRIL 20 MG PO TABS
20.0000 mg | ORAL_TABLET | Freq: Every day | ORAL | Status: DC
  Administered 2019-06-30: 20 mg via ORAL
  Filled 2019-06-30: qty 1

## 2019-06-30 NOTE — Discharge Summary (Signed)
Physician Discharge Summary  Glen Finley ZOX:[id number] DOB: Aug 03, 1934 DOA: 06/28/2019  PCP: Shon Baton, MD  Admit date: 06/28/2019 Discharge date: 06/30/2019  Time spent: 40 minutes  Recommendations for Outpatient Follow-up:  1. Recommend initiation of lactulose at usual home doses of twice a day and follow-up with home health to ensure compliance on medications etc. 2. Recommend Chem-12 CBC etc. in the outpatient setting in about 1 week 3. Home health as well as PT OT ordered on discharge  Discharge Diagnoses:  Active Problems:   Hepatic encephalopathy (HCC)   Encephalopathy   Discharge Condition: Improved  Diet recommendation: Heart healthy very low-salt  Filed Weights   06/29/19 0456 06/30/19 0036  Weight: 87.7 kg 88.1 kg    History of present illness:  29 white male known progressive gait disorder with apraxia?  Parkinson plus VS Lewy body disease, bipolar, fatty liver history of LFT elevation 2013, migraine, hyperlipidemia, HTN, reflux, HFpEF last echo 60-65%?  Aortic root dilatation 4.4 cm follows with Dr. Ian Malkin under treatment Dr. Krista Blue probable OSA Admitted from [facility] ED for 721 increasing confusion was awake oriented x2 but unable to tell the year vital signs other than elevated blood pressure relatively stable Ammonia 170 1T bili 2 AST 77, UA?  Bacteria and CXR CT head negative   Hospital Course:  Acute hepatic encephalopathy in a setting of probable fatty liver disease, cirrhosis Documented noncompliance from wife at bedside usually takes only once nightly-schedule will be reinforced AFP was performed which was 7.2 and within normal limits RUQ [place] 4/7 shows cirrhosis without any ductal dilatation Ammonia has improved to 45--patient's mentation relatively rapidly improved with initiation of what was doing his usual doses of lactulose and he was able to discharge home  Cirrhosis liver Continue Lasix 20 as as needed-continue atenolol 25 AM Outpatient  screening varices as indicated  Myasthenia gravis, progressive gait disorder Does not seem to be on any medications-outpatient acc to wife no eveidence could be found, so Dr. Krista Blue released him to PCP for further care  HTN For now holding lisinopril 20 twice daily, atenolol as above His lisinopril was reimplemented on discharge  Depression Continue amitriptyline 25 at bedtime, Lexapro 10  HFpEF EF 60-65% Continue Lasix as above-lisinopril held from admission and resume on discharge    Discharge Exam: Vitals:   06/30/19 0324 06/30/19 0600  BP: (!) 143/66 (!) 149/66  Pulse: 66   Resp: 19 18  Temp: 98.4 F (36.9 C)   SpO2: 94% 95%     He is more awake today he is able to ambulate around the unit he feels overall better and closer to normal he felt a little bit unsteady and I spoke with therapy services  General: Awake more coherent no distress cannot tell me the date time year and can recall that we had a conversation yesterday with his wife Cardiovascular: S1-S2 no murmur rub or gallop Respiratory: Clinically clear no rales no rhonchi Abdomen soft no rebound no hepatosplenomegaly no asterixis   Discharge Instructions   Discharge Instructions    Diet - low sodium heart healthy   Complete by: As directed    Discharge instructions   Complete by: As directed    Make sure that you take lactulose twice daily without fail as he will need to have multiple stools during the day-and not taking it will cause you to have more confusion please follow-up with your regular physician in about 1 to 2 weeks and we can discuss further  planning as an outpatient I have not made any changes to his medications Advanta reimplement his lactulose at a slightly higher dose   Increase activity slowly   Complete by: As directed      Allergies as of 06/30/2019      Reactions   Amlodipine Swelling, Other (See Comments)   CAUSED SEVERE SWELLING OF THE LEGS THAT REQUIRED A TRIP TO THE MD'S OFFICE    Cherry Other (See Comments)   Migraines   Onion Other (See Comments)   Can eat cooked onions ONLY (or, patient develops migraines)   Codeine Rash      Medication List    TAKE these medications   alendronate 70 MG tablet Commonly known as: FOSAMAX Take 70 mg by mouth every Sunday.   amitriptyline 25 MG tablet Commonly known as: ELAVIL Take 25 mg by mouth at bedtime.   aspirin EC 81 MG tablet Take 81 mg by mouth See admin instructions. Take 81 mg by mouth at bedtime on Mon/Wed/Fri (only)   atenolol 25 MG tablet Commonly known as: TENORMIN Take 25 mg by mouth in the morning.   escitalopram 10 MG tablet Commonly known as: LEXAPRO Take 10 mg by mouth in the morning.   fish oil-omega-3 fatty acids 1000 MG capsule Take 1 g by mouth 2 (two) times daily.   furosemide 20 MG tablet Commonly known as: LASIX Take 20 mg by mouth daily as needed for fluid or edema.   lactulose 10 GM/15ML solution Commonly known as: CHRONULAC Take 15 mLs (10 g total) by mouth 2 (two) times daily. What changed: when to take this   lisinopril 20 MG tablet Commonly known as: ZESTRIL Take 20 mg by mouth 2 (two) times daily.   One-A-Day Mens 50+ Tabs Take 1 tablet by mouth daily with breakfast.   SALONPAS ARTHRITIS PAIN RELIEF EX Apply 1 application topically daily as needed (for right knee pain).   Systane Ultra PF 0.4-0.3 % Soln Generic drug: Polyethyl Glyc-Propyl Glyc PF Place 1 drop into both eyes 2 (two) times daily as needed (for eye redness or burning).   Vitamin D-3 25 MCG (1000 UT) Caps Take 1,000 Units by mouth daily with breakfast.            Durable Medical Equipment  (From admission, onward)         Start     Ordered   06/30/19 1034  DME Walker  Once    Question Answer Comment  Walker: With 5 Inch Wheels   Patient needs a walker to treat with the following condition Debility      04 /09/21 1034         Allergies  Allergen Reactions  . Amlodipine Swelling and  Other (See Comments)    CAUSED SEVERE SWELLING OF THE LEGS THAT REQUIRED A TRIP TO THE MD'S OFFICE  . Cherry Other (See Comments)    Migraines   . Onion Other (See Comments)    Can eat cooked onions ONLY (or, patient develops migraines)  . Codeine Rash      The results of significant diagnostics from this hospitalization (including imaging, microbiology, ancillary and laboratory) are listed below for reference.    Significant Diagnostic Studies: CT Head Wo Contrast  Result Date: 06/28/2019 CLINICAL DATA:  Confusion. Increased confusion over the past 2 days. EXAM: CT HEAD WITHOUT CONTRAST TECHNIQUE: Contiguous axial images were obtained from the base of the skull through the vertex without intravenous contrast. COMPARISON:  Head CT and brain MRI  May 2013 FINDINGS: Brain: No intracranial hemorrhage, mass effect, or midline shift. Age related atrophy. No hydrocephalus. The basilar cisterns are patent. Moderate to advanced chronic small vessel ischemia. No evidence of territorial infarct or acute ischemia. No extra-axial or intracranial fluid collection. Vascular: Atherosclerosis of skullbase vasculature without hyperdense vessel or abnormal calcification. Skull: No fracture or focal lesion. Sinuses/Orbits: Complete opacification of left side of sphenoid sinus with high-density material. Opacification of posterior left ethmoid air cells. There is some surrounding bony sclerosis. Mild mucosal thickening of right side of sphenoid sinus. Mastoid air cells are clear. Bilateral cataract resection. Other: None. IMPRESSION: 1. No acute intracranial abnormality. 2. Age related atrophy. Moderate to advanced chronic small vessel ischemia. 3. Opacification of left sphenoid sinus and posterior ethmoid air cells with high density material. There is adjacent bony sclerosis suggesting this is chronic sinusitis, however was not seen on 2013 exam. Electronically Signed   By: Keith Rake M.D.   On: 06/28/2019 17:47    DG Chest Port 1 View  Result Date: 06/28/2019 CLINICAL DATA:  Increased confusion, cirrhosis EXAM: PORTABLE CHEST 1 VIEW COMPARISON:  None. FINDINGS: No consolidation or edema. No pleural effusion or pneumothorax. Cardiomediastinal contours are within normal limits for portable technique. There is calcified plaque along the aortic arch. IMPRESSION: No acute process in the chest. Electronically Signed   By: Macy Mis M.D.   On: 06/28/2019 15:49   US Abdomen Limited RUQ  Result Date: 06/28/2019 CLINICAL DATA:  Altered mental status.  LFT elevation. EXAM: ULTRASOUND ABDOMEN LIMITED RIGHT UPPER QUADRANT COMPARISON:  None. FINDINGS: Gallbladder: No gallstones or wall thickening visualized. No sonographic Murphy sign noted by sonographer. Common bile duct: Diameter: The common bile duct was poorly evaluated. Liver: Diffuse increased echogenicity with slightly heterogeneous liver. Appearance typically secondary to fatty infiltration. Fibrosis secondary consideration. No secondary findings of cirrhosis noted. No focal hepatic lesion or intrahepatic biliary duct dilatation. The liver surface appears nodular. Portal vein is patent on color Doppler imaging with normal direction of blood flow towards the liver. Other: None. IMPRESSION: 1. No evidence for cholelithiasis. 2. The common bile duct was not visualized. 3. Cirrhosis. Electronically Signed   By: Constance Holster M.D.   On: 06/28/2019 21:00    Microbiology: Recent Results (from the past 240 hour(s))  SARS CORONAVIRUS 2 (TAT 6-24 HRS) Nasopharyngeal Nasopharyngeal Swab     Status: None   Collection Time: 06/28/19  7:07 PM   Specimen: Nasopharyngeal Swab  Result Value Ref Range Status   SARS Coronavirus 2 NEGATIVE NEGATIVE Final    Comment: (NOTE) SARS-CoV-2 target nucleic acids are NOT DETECTED. The SARS-CoV-2 RNA is generally detectable in upper and lower respiratory specimens during the acute phase of infection. Negative results do not  preclude SARS-CoV-2 infection, do not rule out co-infections with other pathogens, and should not be used as the sole basis for treatment or other patient management decisions. Negative results must be combined with clinical observations, patient history, and epidemiological information. The expected result is Negative. Fact Sheet for Patients: SugarRoll.be Fact Sheet for Healthcare Providers: https://www.woods-mathews.com/ This test is not yet approved or cleared by the [place] FDA and  has been authorized for detection and/or diagnosis of SARS-CoV-2 by FDA under an Emergency Use Authorization (EUA). This EUA will remain  in effect (meaning this test can be used) for the duration of the COVID-19 declaration under Section 56 4(b)(1) of the Act, 21 U.S.C. section 360bbb-3(b)(1), unless the authorization is terminated or revoked sooner. Performed at  [facility], [place] [address] [place], [place] [address]      Labs: Basic Metabolic Panel: Recent Labs  Lab 06/28/19 1531 06/29/19 0253 06/30/19 0805  NA 143 144 140  K 4.4 4.9 3.6  CL 111 109 105  CO2 21* 24 23  GLUCOSE 112* 96 102*  BUN 18 17 23   CREATININE 1.08 1.01 1.12  CALCIUM 9.3 9.0 8.7*   Liver Function Tests: Recent Labs  Lab 06/28/19 1531 06/29/19 0253 06/30/19 0805  AST 77* 108* 64*  ALT 33 36 28  ALKPHOS 80 71 63  BILITOT 2.0* 2.7* 3.2*  PROT 6.3* 6.5 6.3*  ALBUMIN 2.8* 2.9* 2.7*   No results for input(s): LIPASE, AMYLASE in the last 168 hours. Recent Labs  Lab 06/28/19 1532 06/29/19 0253  AMMONIA 171* 45*   CBC: Recent Labs  Lab 06/28/19 1531  WBC 7.5  NEUTROABS 3.7  HGB 15.8  HCT 45.7  MCV 98.7  PLT 105*   Cardiac Enzymes: No results for input(s): CKTOTAL, CKMB, CKMBINDEX, TROPONINI in the last 168 hours. BNP: BNP (last 3 results) No results for input(s): BNP in the last 8760 hours.  ProBNP (last 3 results) No results for input(s):  PROBNP in the last 8760 hours.  CBG: Recent Labs  Lab 06/28/19 1546  GLUCAP 117*    Signed:  Nita Sells MD   [facility] 06/30/2019, 10:34 AM

## 2019-06-30 NOTE — Evaluation (Signed)
Physical Therapy Evaluation Patient Details Name: Glen Finley MRN: [id number] DOB: 1934/04/01 Today's Date: 06/30/2019   History of Present Illness  Glen Finley is a 85 y.o. male with medical history significant of hypertension, hyperlipidemia, cirrhosis, recurrent hepatic encephalopathy, presented to the ED for worsening confusion over the last 2 to 3 weeks.  Clinical Impression  Pt admitted with above diagnosis. Pt was able to ambulate in room with fair safety with RW and cues for stability. Wife can assist pt at home x 24 hours therefore recommend [place] f/u.  Pt currently with functional limitations due to the deficits listed below (see PT Problem List). Pt will benefit from skilled PT to increase their independence and safety with mobility to allow discharge to the venue listed below.      Follow Up Recommendations Home health PT;Supervision/Assistance - 24 hour(HHOT)    Equipment Recommendations  Rolling walker with 5" wheels    Recommendations for Other Services       Precautions / Restrictions Precautions Precautions: Fall Restrictions Weight Bearing Restrictions: No      Mobility  Bed Mobility Overal bed mobility: Independent             General bed mobility comments: Pts condom cath was not on and bed soaked in urine.  Pt cleaned up by PT.  Pt unaware that he was wet.    Transfers Overall transfer level: Needs assistance Equipment used: Rolling walker (2 wheeled) Transfers: Sit to/from Stand Sit to Stand: Supervision         General transfer comment: No assist needed just cues  Ambulation/Gait Ambulation/Gait assistance: Min guard;Min assist Gait Distance (Feet): 100 Feet Assistive device: Rolling walker (2 wheeled) Gait Pattern/deviations: Step-to pattern;Decreased stride length;Decreased step length - right;Decreased step length - left   Gait velocity interpretation: <1.31 ft/sec, indicative of household ambulator General Gait Details: Pt was able  to walk in room with RW with cues for safety.  No LOB but no challenges given. Needed cues to stay close to RW.   Stairs            Wheelchair Mobility    Modified Rankin (Stroke Patients Only)       Balance Overall balance assessment: Needs assistance Sitting-balance support: No upper extremity supported;Feet supported Sitting balance-Leahy Scale: Fair     Standing balance support: Bilateral upper extremity supported;During functional activity Standing balance-Leahy Scale: Poor Standing balance comment: Relies on UE support                             Pertinent Vitals/Pain Pain Assessment: No/denies pain    Home Living Family/patient expects to be discharged to:: Private residence Living Arrangements: Spouse/significant other Available Help at Discharge: Family;Available 24 hours/day Type of Home: House Home Access: Stairs to enter Entrance Stairs-Rails: Right Entrance Stairs-Number of Steps: 3 Home Layout: One level Home Equipment: Walker - 2 wheels      Prior Function Level of Independence: Independent               Hand Dominance   Dominant Hand: Left    Extremity/Trunk Assessment   Upper Extremity Assessment Upper Extremity Assessment: Defer to OT evaluation    Lower Extremity Assessment Lower Extremity Assessment: Generalized weakness    Cervical / Trunk Assessment Cervical / Trunk Assessment: Kyphotic  Communication   Communication: No difficulties  Cognition Arousal/Alertness: Awake/alert Behavior During Therapy: WFL for tasks assessed/performed Overall Cognitive Status: History of cognitive impairments -  at baseline                                 General Comments: Pt alert and oriented x 4.       General Comments      Exercises General Exercises - Lower Extremity Ankle Circles/Pumps: AROM;Both;10 reps;Supine Long Arc Quad: AROM;Both;10 reps;Seated   Assessment/Plan    PT Assessment Patient needs  continued PT services  PT Problem List Decreased activity tolerance;Decreased balance;Decreased mobility;Decreased knowledge of use of DME;Decreased safety awareness;Decreased knowledge of precautions       PT Treatment Interventions DME instruction;Gait training;Functional mobility training;Therapeutic activities;Therapeutic exercise;Balance training;Patient/family education    PT Goals (Current goals can be found in the Care Plan section)  Acute Rehab PT Goals Patient Stated Goal: to go home PT Goal Formulation: With patient Time For Goal Achievement: 07/14/19 Potential to Achieve Goals: Good    Frequency Min 3X/week   Barriers to discharge        Co-evaluation               AM-PAC PT "6 Clicks" Mobility  Outcome Measure Help needed turning from your back to your side while in a flat bed without using bedrails?: None Help needed moving from lying on your back to sitting on the side of a flat bed without using bedrails?: None Help needed moving to and from a bed to a chair (including a wheelchair)?: A Little Help needed standing up from a chair using your arms (e.g., wheelchair or bedside chair)?: A Little Help needed to walk in hospital room?: A Little Help needed climbing 3-5 steps with a railing? : A Little 6 Click Score: 20    End of Session Equipment Utilized During Treatment: Gait belt Activity Tolerance: Patient limited by fatigue Patient left: in chair;with call bell/phone within reach;with chair alarm set Nurse Communication: Mobility status PT Visit Diagnosis: Unsteadiness on feet (R26.81);Muscle weakness (generalized) (M62.81)    Time: 7782-4235 PT Time Calculation (min) (ACUTE ONLY): 23 min   Charges:   PT Evaluation $PT Eval Moderate Complexity: 1 Mod PT Treatments $Gait Training: 8-22 mins        Travis Mastel W,PT Acute Rehabilitation Services Pager:  [phone]  Office:  [place] 06/30/2019, 11:05 AM

## 2019-06-30 NOTE — TOC Transition Note (Addendum)
Transition of Care [facility]) - CM/SW Discharge Note   Patient Details  Name: Glen Finley MRN: [id number] Date of Birth: [birth year]  [facility] CM/SW Contact:  Alberteen Sam, LCSW Phone Number: 06/30/2019, 12:02 PM   Clinical Narrative:     CSW spoke with patient's wife [name], agreeable to home health PT and OT with no agency preference. She reports needing DME rolling walker but would like it delivered to the home.   CSW spoke with Tommi Rumps with [facility] they are able to accept patient for PT and OT. CSW spoke with Zack with [employer] for walker to be delivered to the home. Address confirmed correct in chart.   No further discharge needs at this time.  Wife will pick up at time of dc.   Final next level of care: [place] Barriers to Discharge: No Barriers Identified   Patient Goals and CMS Choice Patient states their goals for this hospitalization and ongoing recovery are:: to go home CMS Medicare.gov Compare Post Acute Care list provided to:: Patient Represenative (must comment)(spouse [name]) Choice offered to / list presented to : Spouse  Discharge Placement                       Discharge Plan and Services                DME Arranged: Gilford Rile rolling DME Agency: [employer] Date DME Agency Contacted: 06/30/19 Time DME Agency Contacted: 23 Representative spoke with at DME Agency: [place]: PT, OT [place] Agency: [place] Date [place]: 06/30/19 Time [place]: 96 Representative spoke with at [place]: [place] ([place]) Interventions     Readmission Risk Interventions No flowsheet data found.

## 2019-07-01 ENCOUNTER — Emergency Department (HOSPITAL_COMMUNITY): Payer: Medicare Other

## 2019-07-01 ENCOUNTER — Other Ambulatory Visit: Payer: Self-pay

## 2019-07-01 ENCOUNTER — Encounter (HOSPITAL_COMMUNITY): Payer: Self-pay

## 2019-07-01 ENCOUNTER — Inpatient Hospital Stay (HOSPITAL_COMMUNITY)
Admission: EM | Admit: 2019-07-01 | Discharge: 2019-07-17 | DRG: 853 | Disposition: A | Discharge status: Rehabilitation Facility | Payer: Medicare Other | Attending: Family Medicine | Admitting: Family Medicine

## 2019-07-01 DIAGNOSIS — K219 Gastro-esophageal reflux disease without esophagitis: Secondary | ICD-10-CM | POA: Diagnosis present

## 2019-07-01 DIAGNOSIS — E785 Hyperlipidemia, unspecified: Secondary | ICD-10-CM | POA: Diagnosis present

## 2019-07-01 DIAGNOSIS — R6520 Severe sepsis without septic shock: Secondary | ICD-10-CM | POA: Diagnosis present

## 2019-07-01 DIAGNOSIS — C799 Secondary malignant neoplasm of unspecified site: Secondary | ICD-10-CM | POA: Diagnosis not present

## 2019-07-01 DIAGNOSIS — G9341 Metabolic encephalopathy: Secondary | ICD-10-CM | POA: Diagnosis present

## 2019-07-01 DIAGNOSIS — G4752 REM sleep behavior disorder: Secondary | ICD-10-CM | POA: Diagnosis present

## 2019-07-01 DIAGNOSIS — C7A1 Malignant poorly differentiated neuroendocrine tumors: Secondary | ICD-10-CM | POA: Diagnosis not present

## 2019-07-01 DIAGNOSIS — E876 Hypokalemia: Secondary | ICD-10-CM | POA: Diagnosis not present

## 2019-07-01 DIAGNOSIS — R509 Fever, unspecified: Secondary | ICD-10-CM

## 2019-07-01 DIAGNOSIS — C760 Malignant neoplasm of head, face and neck: Secondary | ICD-10-CM

## 2019-07-01 DIAGNOSIS — I110 Hypertensive heart disease with heart failure: Secondary | ICD-10-CM | POA: Diagnosis present

## 2019-07-01 DIAGNOSIS — K760 Fatty (change of) liver, not elsewhere classified: Secondary | ICD-10-CM | POA: Diagnosis present

## 2019-07-01 DIAGNOSIS — K7290 Hepatic failure, unspecified without coma: Secondary | ICD-10-CM | POA: Diagnosis not present

## 2019-07-01 DIAGNOSIS — J9601 Acute respiratory failure with hypoxia: Secondary | ICD-10-CM | POA: Diagnosis not present

## 2019-07-01 DIAGNOSIS — F29 Unspecified psychosis not due to a substance or known physiological condition: Secondary | ICD-10-CM | POA: Diagnosis present

## 2019-07-01 DIAGNOSIS — I10 Essential (primary) hypertension: Secondary | ICD-10-CM | POA: Diagnosis not present

## 2019-07-01 DIAGNOSIS — C73 Malignant neoplasm of thyroid gland: Secondary | ICD-10-CM | POA: Diagnosis not present

## 2019-07-01 DIAGNOSIS — G7000 Myasthenia gravis without (acute) exacerbation: Secondary | ICD-10-CM | POA: Diagnosis present

## 2019-07-01 DIAGNOSIS — C770 Secondary and unspecified malignant neoplasm of lymph nodes of head, face and neck: Secondary | ICD-10-CM | POA: Diagnosis present

## 2019-07-01 DIAGNOSIS — J390 Retropharyngeal and parapharyngeal abscess: Secondary | ICD-10-CM | POA: Diagnosis present

## 2019-07-01 DIAGNOSIS — Z20822 Contact with and (suspected) exposure to covid-19: Secondary | ICD-10-CM | POA: Diagnosis present

## 2019-07-01 DIAGNOSIS — Z7189 Other specified counseling: Secondary | ICD-10-CM | POA: Diagnosis not present

## 2019-07-01 DIAGNOSIS — J9811 Atelectasis: Secondary | ICD-10-CM | POA: Diagnosis present

## 2019-07-01 DIAGNOSIS — R0989 Other specified symptoms and signs involving the circulatory and respiratory systems: Secondary | ICD-10-CM | POA: Diagnosis not present

## 2019-07-01 DIAGNOSIS — R0981 Nasal congestion: Secondary | ICD-10-CM

## 2019-07-01 DIAGNOSIS — L0211 Cutaneous abscess of neck: Secondary | ICD-10-CM | POA: Diagnosis present

## 2019-07-01 DIAGNOSIS — G43909 Migraine, unspecified, not intractable, without status migrainosus: Secondary | ICD-10-CM | POA: Diagnosis present

## 2019-07-01 DIAGNOSIS — G20 Parkinson's disease: Secondary | ICD-10-CM | POA: Diagnosis not present

## 2019-07-01 DIAGNOSIS — R630 Anorexia: Secondary | ICD-10-CM | POA: Diagnosis present

## 2019-07-01 DIAGNOSIS — G259 Extrapyramidal and movement disorder, unspecified: Secondary | ICD-10-CM | POA: Diagnosis present

## 2019-07-01 DIAGNOSIS — R5381 Other malaise: Secondary | ICD-10-CM | POA: Diagnosis not present

## 2019-07-01 DIAGNOSIS — K7460 Unspecified cirrhosis of liver: Secondary | ICD-10-CM

## 2019-07-01 DIAGNOSIS — E669 Obesity, unspecified: Secondary | ICD-10-CM | POA: Diagnosis present

## 2019-07-01 DIAGNOSIS — R609 Edema, unspecified: Secondary | ICD-10-CM | POA: Diagnosis not present

## 2019-07-01 DIAGNOSIS — R269 Unspecified abnormalities of gait and mobility: Secondary | ICD-10-CM | POA: Diagnosis present

## 2019-07-01 DIAGNOSIS — C778 Secondary and unspecified malignant neoplasm of lymph nodes of multiple regions: Secondary | ICD-10-CM | POA: Diagnosis not present

## 2019-07-01 DIAGNOSIS — Z8616 Personal history of COVID-19: Secondary | ICD-10-CM

## 2019-07-01 DIAGNOSIS — K766 Portal hypertension: Secondary | ICD-10-CM | POA: Diagnosis present

## 2019-07-01 DIAGNOSIS — R7401 Elevation of levels of liver transaminase levels: Secondary | ICD-10-CM | POA: Diagnosis not present

## 2019-07-01 DIAGNOSIS — R221 Localized swelling, mass and lump, neck: Secondary | ICD-10-CM | POA: Diagnosis not present

## 2019-07-01 DIAGNOSIS — D696 Thrombocytopenia, unspecified: Secondary | ICD-10-CM | POA: Diagnosis not present

## 2019-07-01 DIAGNOSIS — Z888 Allergy status to other drugs, medicaments and biological substances status: Secondary | ICD-10-CM

## 2019-07-01 DIAGNOSIS — E860 Dehydration: Secondary | ICD-10-CM | POA: Diagnosis present

## 2019-07-01 DIAGNOSIS — R2689 Other abnormalities of gait and mobility: Secondary | ICD-10-CM | POA: Diagnosis not present

## 2019-07-01 DIAGNOSIS — Z91018 Allergy to other foods: Secondary | ICD-10-CM

## 2019-07-01 DIAGNOSIS — R21 Rash and other nonspecific skin eruption: Secondary | ICD-10-CM | POA: Diagnosis not present

## 2019-07-01 DIAGNOSIS — Z79899 Other long term (current) drug therapy: Secondary | ICD-10-CM

## 2019-07-01 DIAGNOSIS — K7682 Hepatic encephalopathy: Secondary | ICD-10-CM

## 2019-07-01 DIAGNOSIS — I5032 Chronic diastolic (congestive) heart failure: Secondary | ICD-10-CM | POA: Diagnosis present

## 2019-07-01 DIAGNOSIS — K7200 Acute and subacute hepatic failure without coma: Secondary | ICD-10-CM

## 2019-07-01 DIAGNOSIS — D6959 Other secondary thrombocytopenia: Secondary | ICD-10-CM | POA: Diagnosis present

## 2019-07-01 DIAGNOSIS — F319 Bipolar disorder, unspecified: Secondary | ICD-10-CM | POA: Diagnosis present

## 2019-07-01 DIAGNOSIS — Z885 Allergy status to narcotic agent status: Secondary | ICD-10-CM | POA: Diagnosis not present

## 2019-07-01 DIAGNOSIS — B957 Other staphylococcus as the cause of diseases classified elsewhere: Secondary | ICD-10-CM | POA: Diagnosis not present

## 2019-07-01 DIAGNOSIS — R531 Weakness: Secondary | ICD-10-CM | POA: Diagnosis not present

## 2019-07-01 DIAGNOSIS — A419 Sepsis, unspecified organism: Secondary | ICD-10-CM | POA: Diagnosis present

## 2019-07-01 DIAGNOSIS — R262 Difficulty in walking, not elsewhere classified: Secondary | ICD-10-CM | POA: Diagnosis not present

## 2019-07-01 DIAGNOSIS — R7989 Other specified abnormal findings of blood chemistry: Secondary | ICD-10-CM

## 2019-07-01 DIAGNOSIS — R4182 Altered mental status, unspecified: Secondary | ICD-10-CM | POA: Diagnosis present

## 2019-07-01 DIAGNOSIS — Z515 Encounter for palliative care: Secondary | ICD-10-CM | POA: Diagnosis not present

## 2019-07-01 DIAGNOSIS — R410 Disorientation, unspecified: Secondary | ICD-10-CM | POA: Diagnosis not present

## 2019-07-01 DIAGNOSIS — L0291 Cutaneous abscess, unspecified: Secondary | ICD-10-CM

## 2019-07-01 DIAGNOSIS — G9349 Other encephalopathy: Secondary | ICD-10-CM | POA: Diagnosis not present

## 2019-07-01 DIAGNOSIS — K7469 Other cirrhosis of liver: Secondary | ICD-10-CM | POA: Diagnosis not present

## 2019-07-01 DIAGNOSIS — K5790 Diverticulosis of intestine, part unspecified, without perforation or abscess without bleeding: Secondary | ICD-10-CM | POA: Diagnosis present

## 2019-07-01 DIAGNOSIS — I712 Thoracic aortic aneurysm, without rupture: Secondary | ICD-10-CM | POA: Diagnosis present

## 2019-07-01 LAB — URINALYSIS, ROUTINE W REFLEX MICROSCOPIC
Bilirubin Urine: NEGATIVE
Glucose, UA: NEGATIVE mg/dL
Hgb urine dipstick: NEGATIVE
Ketones, ur: NEGATIVE mg/dL
Leukocytes,Ua: NEGATIVE
Nitrite: NEGATIVE
Protein, ur: NEGATIVE mg/dL
Specific Gravity, Urine: 1.018 (ref 1.005–1.030)
pH: 6 (ref 5.0–8.0)

## 2019-07-01 LAB — CBC WITH DIFFERENTIAL/PLATELET
Abs Immature Granulocytes: 0.05 10*3/uL (ref 0.00–0.07)
Basophils Absolute: 0.1 10*3/uL (ref 0.0–0.1)
Basophils Relative: 1 %
Eosinophils Absolute: 0.2 10*3/uL (ref 0.0–0.5)
Eosinophils Relative: 2 %
HCT: 43.6 % (ref 39.0–52.0)
Hemoglobin: 14.8 g/dL (ref 13.0–17.0)
Immature Granulocytes: 1 %
Lymphocytes Relative: 18 %
Lymphs Abs: 1.7 10*3/uL (ref 0.7–4.0)
MCH: 33.6 pg (ref 26.0–34.0)
MCHC: 33.9 g/dL (ref 30.0–36.0)
MCV: 98.9 fL (ref 80.0–100.0)
Monocytes Absolute: 1.2 10*3/uL — ABNORMAL HIGH (ref 0.1–1.0)
Monocytes Relative: 12 %
Neutro Abs: 6.7 10*3/uL (ref 1.7–7.7)
Neutrophils Relative %: 66 %
Platelets: 94 10*3/uL — ABNORMAL LOW (ref 150–400)
RBC: 4.41 MIL/uL (ref 4.22–5.81)
RDW: 14.9 % (ref 11.5–15.5)
WBC: 9.9 10*3/uL (ref 4.0–10.5)
nRBC: 0 % (ref 0.0–0.2)

## 2019-07-01 LAB — COMPREHENSIVE METABOLIC PANEL
ALT: 32 U/L (ref 0–44)
AST: 88 U/L — ABNORMAL HIGH (ref 15–41)
Albumin: 2.6 g/dL — ABNORMAL LOW (ref 3.5–5.0)
Alkaline Phosphatase: 66 U/L (ref 38–126)
Anion gap: 10 (ref 5–15)
BUN: 22 mg/dL (ref 8–23)
CO2: 21 mmol/L — ABNORMAL LOW (ref 22–32)
Calcium: 8.9 mg/dL (ref 8.9–10.3)
Chloride: 106 mmol/L (ref 98–111)
Creatinine, Ser: 1.02 mg/dL (ref 0.61–1.24)
GFR calc Af Amer: >60 mL/min (ref >60)
GFR calc non Af Amer: >60 mL/min (ref >60)
Glucose, Bld: 123 mg/dL — ABNORMAL HIGH (ref 70–99)
Potassium: 4 mmol/L (ref 3.5–5.1)
Sodium: 137 mmol/L (ref 135–145)
Total Bilirubin: 2.6 mg/dL — ABNORMAL HIGH (ref 0.3–1.2)
Total Protein: 6.2 g/dL — ABNORMAL LOW (ref 6.5–8.1)

## 2019-07-01 LAB — PROTIME-INR
INR: 1.5 — ABNORMAL HIGH (ref 0.8–1.2)
Prothrombin Time: 18.3 seconds — ABNORMAL HIGH (ref 11.4–15.2)

## 2019-07-01 LAB — AMMONIA: Ammonia: 92 umol/L — ABNORMAL HIGH (ref 9–35)

## 2019-07-01 LAB — LACTIC ACID, PLASMA: Lactic Acid, Venous: 2.4 mmol/L (ref 0.5–1.9)

## 2019-07-01 MED ORDER — ACETAMINOPHEN 650 MG RE SUPP
650.0000 mg | Freq: Four times a day (QID) | RECTAL | Status: DC | PRN
  Administered 2019-07-02: 650 mg via RECTAL
  Filled 2019-07-01: qty 1

## 2019-07-01 MED ORDER — METRONIDAZOLE IN NACL 5-0.79 MG/ML-% IV SOLN
500.0000 mg | Freq: Once | INTRAVENOUS | Status: AC
  Administered 2019-07-01: 500 mg via INTRAVENOUS
  Filled 2019-07-01: qty 100

## 2019-07-01 MED ORDER — METRONIDAZOLE IN NACL 5-0.79 MG/ML-% IV SOLN
500.0000 mg | Freq: Three times a day (TID) | INTRAVENOUS | Status: DC
  Administered 2019-07-02 – 2019-07-05 (×10): 500 mg via INTRAVENOUS
  Filled 2019-07-01 (×10): qty 100

## 2019-07-01 MED ORDER — SODIUM CHLORIDE 0.9 % IV SOLN
2.0000 g | Freq: Two times a day (BID) | INTRAVENOUS | Status: DC
  Administered 2019-07-01: 2 g via INTRAVENOUS
  Filled 2019-07-01: qty 2

## 2019-07-01 MED ORDER — IOHEXOL 300 MG/ML  SOLN
100.0000 mL | Freq: Once | INTRAMUSCULAR | Status: AC | PRN
  Administered 2019-07-01: 23:00:00 100 mL via INTRAVENOUS

## 2019-07-01 MED ORDER — VANCOMYCIN HCL IN DEXTROSE 1-5 GM/200ML-% IV SOLN: 1000.0000 mg | Freq: Once | INTRAVENOUS | Status: DC

## 2019-07-01 MED ORDER — SODIUM CHLORIDE 0.9 % IV BOLUS (SEPSIS)
1000.0000 mL | Freq: Once | INTRAVENOUS | Status: AC
  Administered 2019-07-01: 22:00:00 1000 mL via INTRAVENOUS

## 2019-07-01 MED ORDER — VANCOMYCIN HCL 1750 MG/350ML IV SOLN
1750.0000 mg | Freq: Once | INTRAVENOUS | Status: AC
  Administered 2019-07-02: 1750 mg via INTRAVENOUS
  Filled 2019-07-01: qty 350

## 2019-07-01 MED ORDER — LACTULOSE 10 GM/15ML PO SOLN
10.0000 g | Freq: Once | ORAL | Status: AC
  Administered 2019-07-02: 10 g via ORAL
  Filled 2019-07-01: qty 15

## 2019-07-01 MED ORDER — ACETAMINOPHEN 325 MG PO TABS: 650.0000 mg | ORAL_TABLET | Freq: Four times a day (QID) | ORAL | Status: DC | PRN

## 2019-07-01 MED ORDER — VANCOMYCIN HCL 1500 MG/300ML IV SOLN
1500.0000 mg | INTRAVENOUS | Status: DC
  Administered 2019-07-02 – 2019-07-03 (×2): 1500 mg via INTRAVENOUS
  Filled 2019-07-01 (×2): qty 300

## 2019-07-01 MED ORDER — ENOXAPARIN SODIUM 40 MG/0.4ML ~~LOC~~ SOLN
40.0000 mg | Freq: Every day | SUBCUTANEOUS | Status: DC
  Filled 2019-07-01: qty 0.4

## 2019-07-01 MED ORDER — SODIUM CHLORIDE 0.9 % IV SOLN
2.0000 g | Freq: Three times a day (TID) | INTRAVENOUS | Status: DC
  Administered 2019-07-02 – 2019-07-03 (×4): 2 g via INTRAVENOUS
  Filled 2019-07-01 (×7): qty 2

## 2019-07-01 NOTE — [place] Notes (Signed)
Pharmacy Antibiotic Note  Glen Finley is a 85 y.o. male admitted on 07/01/2019 with sepsis.  Pharmacy has been consulted for vancomycin and cefepime dosing.  Has hx of myasthenia gravis. WBC 9.9, LA 2.4, Temp 102.2. Scr 1.02 (CrCl 60 mL/min).  Plan:  Cefepime 2g IV every 8 hours Vancomycin 1750 mg IV x1 then 1500 mg IV every 24 hours (estAUC 473) Monitor renal fx, clinical pic, cx results, and vanc levels as appropriate De-escalate as appropriate      Temp (24hrs), Avg:102.2 F (39 C), Min:102.2 F (39 C), Max:102.2 F (39 C)  Recent Labs  Lab 06/28/19 1531 06/29/19 0253 06/30/19 0805 07/01/19 2057  WBC 7.5  --   --  9.9  CREATININE 1.08 1.01 1.12 1.02  LATICACIDVEN  --   --   --  2.4*    Estimated Creatinine Clearance: 60.3 mL/min (by C-G formula based on SCr of 1.02 mg/dL).    Allergies  Allergen Reactions  . Amlodipine Swelling and Other (See Comments)    CAUSED SEVERE SWELLING OF THE LEGS THAT REQUIRED A TRIP TO THE MD'S OFFICE  . Cherry Other (See Comments)    Migraines   . Onion Other (See Comments)    Can eat cooked onions ONLY (or, patient develops migraines)  . Codeine Rash    Antimicrobials this admission: Vancomycin 4/10 >>  Cefepime 4/10 >>   Dose adjustments this admission: N/A  Microbiology results: 4/10 BCx: sent  4/10 COVID PCR: sent  Thank you for allowing pharmacy to be a part of this patient's care.  Antonietta Jewel, PharmD, [place] Clinical Pharmacist  Phone: [phone] 07/01/2019 10:21 PM  Please check AMION for all [place] phone numbers After 10:00 PM, call [place] [phone]

## 2019-07-01 NOTE — H&P (Signed)
History and Physical    Glen Finley YWV:[id number] DOB: [birth year] DOA: 07/01/2019  PCP: Shon Baton, MD Patient coming from: Home  Chief Complaint: Fever, altered mental status  HPI: Glen Finley is a 85 y.o. male with medical history significant of myasthenia gravis/progressive gait disorder not on any medications, Parkinson's versus Lewy body disease, bipolar, fatty liver disease with history of LFT elevation, migraine, hypertension, hyperlipidemia, GERD, chronic diastolic CHF presenting via EMS for evaluation of fever and altered mental status.  He was recently admitted to the hospital and discharged yesterday after being treated for acute hepatic encephalopathy in the setting of lactulose noncompliance.  Right upper quadrant ultrasound done 4/7 showing cirrhosis.  Patient states he was discharged from the hospital yesterday.  He had a fever around 3 PM this afternoon.  Reports chronic shortness of breath, no recent change.  Denies fevers, chills, cough, chest pain, nausea, vomiting, abdominal pain, diarrhea, dysuria.  States he has already received both of his Covid vaccines.  Unclear if he has taken any lactulose since being discharged from the hospital.  No additional history could be obtained from him.  ED Course: AAOx3.  Febrile and tachypneic.  Not tachycardic or hypotensive.  Not hypoxic.  WBC count normal.  Lactic acid 2.4.  Platelet count 94, no significant change since labs done 3 days ago.  Bicarb 21, anion gap 10.  Blood glucose 123.  Albumin 2.6.  AST 88, T bili 2.6 (elevated on recent labs as well).  ALT and alk phos normal.  INR 1.5.  Ammonia elevated at 92.  UA not suggestive of infection.  Blood cultures pending.  SARS-CoV-2 PCR test pending. Chest x-ray not suggestive of pneumonia. CT abdomen pelvis pending.  Patient received vancomycin, cefepime, metronidazole, and 1 L normal saline bolus.  Lactulose ordered.  Review of Systems:  All systems reviewed and apart from  history of presenting illness, are negative.  Past Medical History:  Diagnosis Date  . Abnormality of gait   . GERD (gastroesophageal reflux disease)   . Headache(784.0)   . Hyperlipidemia   . Hypertension   . Migraine   . Myasthenia gravis without exacerbation ([place])   . Obesity   . Other extrapyramidal disease and abnormal movement disorder   . REM sleep behavior disorder   . Unspecified psychosis   . Weakness     Past Surgical History:  Procedure Laterality Date  . CATARACT EXTRACTION Bilateral      reports that he has never smoked. He has never used smokeless tobacco. He reports that he does not drink alcohol or use drugs.  Allergies  Allergen Reactions  . Amlodipine Swelling and Other (See Comments)    CAUSED SEVERE SWELLING OF THE LEGS THAT REQUIRED A TRIP TO THE MD'S OFFICE  . Cherry Other (See Comments)    Migraines   . Onion Other (See Comments)    Can eat cooked onions ONLY (or, patient develops migraines)  . Codeine Rash    History reviewed. No pertinent family history.  Prior to Admission medications   Medication Sig Start Date End Date Taking? Authorizing Provider  alendronate (FOSAMAX) 70 MG tablet Take 70 mg by mouth every Sunday.  12/17/14   [provider]  amitriptyline (ELAVIL) 25 MG tablet Take 25 mg by mouth at bedtime. 04/19/19   [provider]  aspirin EC 81 MG tablet Take 81 mg by mouth See admin instructions. Take 81 mg by mouth at bedtime on Mon/Wed/Fri (only)    [provider]  atenolol (TENORMIN) 25 MG tablet Take 25 mg by mouth in the morning.  07/04/12   [provider]  Cholecalciferol (VITAMIN D-3) 25 MCG (1000 UT) CAPS Take 1,000 Units by mouth daily with breakfast.    [provider]  escitalopram (LEXAPRO) 10 MG tablet Take 10 mg by mouth in the morning.  02/13/14   [provider]  fish oil-omega-3 fatty acids 1000 MG capsule Take 1 g by mouth 2 (two) times daily.    [provider]  furosemide (LASIX) 20 MG tablet Take 20 mg by mouth daily as needed for fluid or edema.  01/03/17   [provider]  lactulose (CHRONULAC) 10 GM/15ML solution Take 15 mLs (10 g total) by mouth 2 (two) times daily. 06/30/19   Nita Sells, MD  [facility] ARTHRITIS PAIN RELIEF EX) Apply 1 application topically daily as needed (for right knee pain).    [provider]  lisinopril (ZESTRIL) 20 MG tablet Take 20 mg by mouth 2 (two) times daily. 06/01/19   [provider]  Multiple Vitamins-Minerals (ONE-A-DAY MENS 50+) TABS Take 1 tablet by mouth daily with breakfast.    [provider]  Polyethyl Glyc-Propyl Glyc PF (SYSTANE ULTRA PF) 0.4-0.3 % SOLN Place 1 drop into both eyes 2 (two) times daily as needed (for eye redness or burning).    [provider]    Physical Exam: Vitals:   07/01/19 2307 07/01/19 2315 07/01/19 2330 07/02/19 0015  BP: (!) 176/70 (!) 175/69 (!) 185/74 (!) 172/76  Pulse: 85 80 78 77  Resp: (!) 32 (!) 30 (!) 24 (!) 28  Temp:      TempSrc:      SpO2: 96% 100% 100% 98%    Physical Exam  Constitutional: He appears well-developed and well-nourished. No distress.  HENT:  Head: Normocephalic.  Very dry mucous membranes  Eyes: Right eye exhibits no discharge. Left eye exhibits no discharge.  Cardiovascular: Normal rate, regular rhythm and intact distal pulses.  Pulmonary/Chest: Effort normal and breath sounds normal. No respiratory distress. He has no wheezes. He has no rales.  Abdominal: Soft. Bowel sounds are normal. He exhibits no distension. There is no abdominal tenderness. There is no guarding.  Musculoskeletal:        General: No edema.     Cervical back: Neck supple.  Neurological:   AAO x3 but slightly confused and slow to respond to questions No focal motor or sensory deficit  Skin: Skin is warm and dry.     Labs on Admission: I have personally reviewed following labs and imaging  studies  CBC: Recent Labs  Lab 06/28/19 1531 07/01/19 2057  WBC 7.5 9.9  NEUTROABS 3.7 6.7  HGB 15.8 14.8  HCT 45.7 43.6  MCV 98.7 98.9  PLT 105* 94*   Basic Metabolic Panel: Recent Labs  Lab 06/28/19 1531 06/29/19 0253 06/30/19 0805 07/01/19 2057  NA 143 144 140 137  K 4.4 4.9 3.6 4.0  CL 111 109 105 106  CO2 21* 24 23 21*  GLUCOSE 112* 96 102* 123*  BUN 18 17 23 22   CREATININE 1.08 1.01 1.12 1.02  CALCIUM 9.3 9.0 8.7* 8.9   GFR: Estimated Creatinine Clearance: 60.3 mL/min (by C-G formula based on SCr of 1.02 mg/dL). Liver Function Tests: Recent Labs  Lab 06/28/19 1531 06/29/19 0253 06/30/19 0805 07/01/19 2057  AST 77* 108* 64* 88*  ALT 33 36 28 32  ALKPHOS 80 71 63 66  BILITOT 2.0* 2.7* 3.2* 2.6*  PROT 6.3* 6.5 6.3* 6.2*  ALBUMIN 2.8* 2.9* 2.7* 2.6*   No results for input(s): LIPASE, AMYLASE in the last 168 hours. Recent Labs  Lab 06/28/19 1532 06/29/19 0253 07/01/19 2058  AMMONIA 171* 45* 92*   Coagulation Profile: Recent Labs  Lab 06/28/19 1531 06/29/19 0253 07/01/19 2057  INR 1.5* 1.4* 1.5*   Cardiac Enzymes: No results for input(s): CKTOTAL, CKMB, CKMBINDEX, TROPONINI in the last 168 hours. BNP (last 3 results) No results for input(s): PROBNP in the last 8760 hours. HbA1C: No results for input(s): HGBA1C in the last 72 hours. CBG: Recent Labs  Lab 06/28/19 1546  GLUCAP 117*   Lipid Profile: No results for input(s): CHOL, HDL, LDLCALC, TRIG, CHOLHDL, LDLDIRECT in the last 72 hours. Thyroid Function Tests: No results for input(s): TSH, T4TOTAL, FREET4, T3FREE, THYROIDAB in the last 72 hours. Anemia Panel: No results for input(s): VITAMINB12, FOLATE, FERRITIN, TIBC, IRON, RETICCTPCT in the last 72 hours. Urine analysis:    Component Value Date/Time   COLORURINE AMBER (A) 07/01/2019 2058   APPEARANCEUR CLEAR 07/01/2019 2058   LABSPEC 1.018 07/01/2019 2058   PHURINE 6.0 07/01/2019 2058   [place] NEGATIVE 07/01/2019 2058   HGBUR  NEGATIVE 07/01/2019 2058   [place] NEGATIVE 07/01/2019 2058   KETONESUR NEGATIVE 07/01/2019 2058   PROTEINUR NEGATIVE 07/01/2019 2058   UROBILINOGEN 1.0 08/10/2011 1826   NITRITE NEGATIVE 07/01/2019 2058   LEUKOCYTESUR NEGATIVE 07/01/2019 2058    Radiological Exams on Admission: DG Chest 2 View  Result Date: 07/01/2019 CLINICAL DATA:  Altered mental status with fever EXAM: CHEST - 2 VIEW COMPARISON:  06/28/2019, CT chest 09/28/2011 FINDINGS: Mild cardiomegaly. No focal consolidation or pleural effusion. Aortic atherosclerosis. No pneumothorax IMPRESSION: 1. No acute focal airspace disease 2. Mild cardiomegaly Electronically Signed   By: Donavan Foil M.D.   On: 07/01/2019 21:29   CT Abdomen Pelvis W Contrast  Result Date: 07/01/2019 CLINICAL DATA:  Cirrhosis EXAM: CT ABDOMEN AND PELVIS WITH CONTRAST TECHNIQUE: Multidetector CT imaging of the abdomen and pelvis was performed using the standard protocol following bolus administration of intravenous contrast. CONTRAST:  135m OMNIPAQUE IOHEXOL 300 MG/ML  SOLN COMPARISON:  Ultrasound 06/28/2019, CT 06/27/2014 FINDINGS: Lower chest: Lung bases demonstrate streaky atelectasis or scarring within the bases. No acute consolidation or pleural effusion. Borderline to mild cardiomegaly. Hepatobiliary: Cirrhotic morphology of the liver. Subcentimeter hypodensities too small to further characterize. No calcified gallstone or biliary dilatation Pancreas: Unremarkable. No pancreatic ductal dilatation or surrounding inflammatory changes. Spleen: Normal in size without focal abnormality. Adrenals/Urinary Tract: Adrenal glands are normal. Cortical scarring in the mid left kidney. Negative for hydronephrosis. The bladder is normal Stomach/Bowel: Stomach is within normal limits. Appendix appears normal. No evidence of bowel wall thickening, distention, or inflammatory changes. Diverticular disease of the left colon without acute inflammatory change.  Vascular/Lymphatic: Moderate aortic atherosclerosis without aneurysm. Subcentimeter retroperitoneal nodes. Cavernous transformation of the portal vein. Numerous varices and collateral vessels within the abdomen. Reproductive: Slightly enlarged prostate Other: Negative for free air or free fluid. Musculoskeletal: Chronic compression deformity of T12. IMPRESSION: 1. Liver cirrhosis with evidence for portal hypertension. 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 2. Left colon diverticular disease without acute inflammatory process. Electronically Signed   By: KDonavan FoilM.D.   On: 07/01/2019 23:26    EKG: Independently reviewed.  Sinus rhythm, nonspecific T wave abnormalities, baseline wander in V2 and V4.  No significant change  since prior tracing.  Assessment/Plan Principal Problem:   Sepsis ([place]) Active Problems:   Hypertension   Myasthenia gravis without exacerbation ([place])   Acute hepatic encephalopathy   Liver cirrhosis (HCC)   Sepsis from unknown source: Febrile and tachypneic on arrival.  No leukocytosis.  Lactic acid 2.4.  No obvious source identified so far.  Chest x-ray negative for pneumonia.  UA not suggestive of UTI.  CT abdomen pelvis without obvious source of infection.  No meningeal signs on exam.  COVID-19 infection less likely as patient has already received both of his vaccines. -Continue broad-spectrum antibiotics including vancomycin, cefepime, and metronidazole.  Continue IV fluid hydration.  Tylenol as needed for fevers.  Follow-up blood cultures.  Will also add on urine culture.  Continue to trend lactate.  SARS-CoV-2 PCR test pending, continue airborne and contact precautions.  Acute hepatic encephalopathy, liver cirrhosis:  AAO x3 but slightly confused and slow to respond to questions. Ammonia level elevated at 92.  LFTs elevated.  Right upper quadrant ultrasound on 4/7 showing cirrhosis and no evidence of  cholelithiasis.  Plan is to give lactulose.  Repeat ammonia level in the morning.  Hold Lasix as he appears dehydrated.  Resume beta-blocker after pharmacy med rec is done.  Myasthenia gravis, progressive gait disorder: Not on any medications.  Ensure neurology follow-up.  Hypertension: Blood pressure elevated with systolic in the 004H.  Order hydralazine as needed.  Thrombocytopenia: Likely related to liver disease. Platelet count 94, no significant change since labs done 3 days ago.  Continue to monitor.  Chronic diastolic congestive heart failure: Last echo done May 2019 with normal systolic function and grade 1 diastolic dysfunction.  No signs of volume overload at this time.  Hold Lasix at this time as he appears dehydrated.  Pharmacy med rec pending.  DVT prophylaxis: Lovenox Code Status: Patient wishes to be full code. Family Communication: No family available at this time. Disposition Plan: Anticipate discharge after resolution of sepsis and hepatic encephalopathy.  Consults called: None Admission status: It is my clinical opinion that admission to INPATIENT is reasonable and necessary because of the expectation that this patient will require hospital care that crosses at least 2 midnights to treat this condition based on the medical complexity of the problems presented.  Given the aforementioned information, the predictability of an adverse outcome is felt to be significant.  The medical decision making on this patient was of high complexity and the patient is at high risk for clinical deterioration, therefore this is a level 3 visit.  Shela Leff MD [facility]  If 7PM-7AM, please contact night-coverage [email or web address]  07/02/2019, 12:27 AM

## 2019-07-01 NOTE — ED Provider Notes (Addendum)
[facility] EMERGENCY DEPARTMENT Provider Note   CSN: [id number] Arrival date & time: 07/01/19  2033     History Chief Complaint  Patient presents with  . Fever  . Altered Mental Status    Glen Finley is a 85 y.o. male with PMHx HTN, HLD, GERD, myasthenia gravis, cirrhosis who presents to the ED today via EMS for fevers tmax 102.2 that started today. Pt recently discharged from the hospital yesterday for hepatic encephalopathy with ammonia 170. There was concern for noncompliance during hospital stay. PT was discharged with instructions to take lactulose BID and he reports he has been compliant. Ammonia at time of discharge 45. He has no complaints currently. Denies pain anywhere. Reports he feels fine but his wife brought him in due to his fever.   The history is provided by the patient and medical records.       Past Medical History:  Diagnosis Date  . Abnormality of gait   . GERD (gastroesophageal reflux disease)   . Headache(784.0)   . Hyperlipidemia   . Hypertension   . Migraine   . Myasthenia gravis without exacerbation ([place])   . Obesity   . Other extrapyramidal disease and abnormal movement disorder   . REM sleep behavior disorder   . Unspecified psychosis   . Weakness     Patient Active Problem List   Diagnosis Date Noted  . Hepatic encephalopathy ([place]) 06/28/2019  . Encephalopathy 06/28/2019  . Transient alteration of awareness   . Sleep apnea 03/07/2015  . Headache(784.0)   . GERD (gastroesophageal reflux disease)   . Obesity   . REM sleep behavior disorder   . Other extrapyramidal disease and abnormal movement disorder   . Myasthenia gravis without exacerbation ([place])   . Abnormality of gait   . Unspecified psychosis   . Weakness   . Dysarthria 08/10/2011    Class: Acute  . Gait instability 08/10/2011    Class: Acute  . Hypertension 08/10/2011    Class: Chronic  . Hyperlipidemia 08/10/2011    Class: Chronic    Past Surgical  History:  Procedure Laterality Date  . CATARACT EXTRACTION Bilateral        History reviewed. No pertinent family history.  Social History   Tobacco Use  . Smoking status: Never Smoker  . Smokeless tobacco: Never Used  Substance Use Topics  . Alcohol use: No    Alcohol/week: 0.0 standard drinks  . Drug use: No    Home Medications Prior to Admission medications   Medication Sig Start Date End Date Taking? Authorizing Provider  alendronate (FOSAMAX) 70 MG tablet Take 70 mg by mouth every Sunday.  12/17/14   [provider]  amitriptyline (ELAVIL) 25 MG tablet Take 25 mg by mouth at bedtime. 04/19/19   [provider]  aspirin EC 81 MG tablet Take 81 mg by mouth See admin instructions. Take 81 mg by mouth at bedtime on Mon/Wed/Fri (only)    [provider]  atenolol (TENORMIN) 25 MG tablet Take 25 mg by mouth in the morning.  07/04/12   [provider]  Cholecalciferol (VITAMIN D-3) 25 MCG (1000 UT) CAPS Take 1,000 Units by mouth daily with breakfast.    [provider]  escitalopram (LEXAPRO) 10 MG tablet Take 10 mg by mouth in the morning.  02/13/14   [provider]  fish oil-omega-3 fatty acids 1000 MG capsule Take 1 g by mouth 2 (two) times daily.    [provider]  furosemide (LASIX) 20 MG tablet Take 20 mg by mouth daily as needed for fluid or edema.  01/03/17   [provider]  lactulose (CHRONULAC) 10 GM/15ML solution Take 15 mLs (10 g total) by mouth 2 (two) times daily. 06/30/19   Nita Sells, MD  [facility] ARTHRITIS PAIN RELIEF EX) Apply 1 application topically daily as needed (for right knee pain).    [provider]  lisinopril (ZESTRIL) 20 MG tablet Take 20 mg by mouth 2 (two) times daily. 06/01/19   [provider]  Multiple Vitamins-Minerals (ONE-A-DAY MENS 50+) TABS Take 1 tablet by mouth daily with breakfast.    [provider]  Polyethyl Glyc-Propyl Glyc  PF (SYSTANE ULTRA PF) 0.4-0.3 % SOLN Place 1 drop into both eyes 2 (two) times daily as needed (for eye redness or burning).    [provider]    Allergies    Amlodipine, Cherry, Onion, and Codeine  Review of Systems   Review of Systems  Constitutional: Positive for fever. Negative for chills.  Respiratory: Negative for cough and shortness of breath.   Cardiovascular: Negative for chest pain.  Gastrointestinal: Negative for abdominal pain, nausea and vomiting.  All other systems reviewed and are negative.   Physical Exam Updated Vital Signs BP (!) 166/65   Pulse 73   Temp (!) 102.2 F (39 C) (Oral)   Resp (!) 29   SpO2 95%   Physical Exam Vitals and nursing note reviewed.  Constitutional:      Appearance: He is not ill-appearing or diaphoretic.  HENT:     Head: Normocephalic and atraumatic.  Eyes:     Conjunctiva/sclera: Conjunctivae normal.  Cardiovascular:     Rate and Rhythm: Normal rate and regular rhythm.     Pulses: Normal pulses.  Pulmonary:     Effort: Pulmonary effort is normal.     Breath sounds: Rhonchi present. No wheezing or rales.  Abdominal:     Palpations: Abdomen is soft.     Tenderness: There is no abdominal tenderness. There is no guarding or rebound.  Musculoskeletal:     Cervical back: Neck supple.     Right lower leg: No edema.     Left lower leg: No edema.  Skin:    General: Skin is warm and dry.  Neurological:     General: No focal deficit present.     Mental Status: He is alert and oriented to person, place, and time.     ED Results / Procedures / Treatments   Labs (all labs ordered are listed, but only abnormal results are displayed) Labs Reviewed  COMPREHENSIVE METABOLIC PANEL - Abnormal; Notable for the following components:      Result Value   CO2 21 (*)    Glucose, Bld 123 (*)    Total Protein 6.2 (*)    Albumin 2.6 (*)    AST 88 (*)    Total Bilirubin 2.6 (*)    All other components within normal limits  LACTIC  ACID, PLASMA - Abnormal; Notable for the following components:   Lactic Acid, Venous 2.4 (*)    All other components within normal limits  CBC WITH DIFFERENTIAL/PLATELET - Abnormal; Notable for the following components:   Platelets 94 (*)    Monocytes Absolute 1.2 (*)    All other components within normal limits  PROTIME-INR - Abnormal; Notable for the following components:   Prothrombin Time 18.3 (*)    INR 1.5 (*)    All other components within  normal limits  URINALYSIS, ROUTINE W REFLEX MICROSCOPIC - Abnormal; Notable for the following components:   Color, Urine AMBER (*)    All other components within normal limits  AMMONIA - Abnormal; Notable for the following components:   Ammonia 92 (*)    All other components within normal limits  CULTURE, BLOOD (ROUTINE X 2)  CULTURE, BLOOD (ROUTINE X 2)  SARS CORONAVIRUS 2 (TAT 6-24 HRS)  LACTIC ACID, PLASMA  POC SARS CORONAVIRUS 2 AG -  ED    EKG None  Radiology DG Chest 2 View  Result Date: 07/01/2019 CLINICAL DATA:  Altered mental status with fever EXAM: CHEST - 2 VIEW COMPARISON:  06/28/2019, CT chest 09/28/2011 FINDINGS: Mild cardiomegaly. No focal consolidation or pleural effusion. Aortic atherosclerosis. No pneumothorax IMPRESSION: 1. No acute focal airspace disease 2. Mild cardiomegaly Electronically Signed   By: Donavan Foil M.D.   On: 07/01/2019 21:29    Procedures .Critical Care Performed by: Eustaquio Maize, PA-C Authorized by: Eustaquio Maize, PA-C   Critical care provider statement:    Critical care time (minutes):  45   Critical care was necessary to treat or prevent imminent or life-threatening deterioration of the following conditions:  Toxidrome and hepatic failure   Critical care was time spent personally by me on the following activities:  Discussions with consultants, evaluation of patient's response to treatment, examination of patient, ordering and performing treatments and interventions, ordering and review  of laboratory studies, ordering and review of radiographic studies, pulse oximetry, re-evaluation of patient's condition, obtaining history from patient or surrogate and review of old charts   (including critical care time)  Medications Ordered in ED Medications  metroNIDAZOLE (FLAGYL) IVPB 500 mg (500 mg Intravenous New Bag/Given 07/01/19 2307)  vancomycin (VANCOREADY) IVPB 1750 mg/350 mL (has no administration in time range)  ceFEPIme (MAXIPIME) 2 g in sodium chloride 0.9 % 100 mL IVPB (has no administration in time range)  vancomycin (VANCOREADY) IVPB 1500 mg/300 mL (has no administration in time range)  lactulose (CHRONULAC) 10 GM/15ML solution 10 g (has no administration in time range)  sodium chloride 0.9 % bolus 1,000 mL (0 mLs Intravenous Stopped 07/01/19 2307)  iohexol (OMNIPAQUE) 300 MG/ML solution 100 mL (100 mLs Intravenous Contrast Given 07/01/19 2250)    ED Course  I have reviewed the triage vital signs and the nursing notes.  Pertinent labs & imaging results that were available during my care of the patient were reviewed by me and considered in my medical decision making (see chart for details).  Clinical Course as of Jul 01 2347  Sat Jul 01, 2019  2157 Ammonia(!): 92 [MV]  2157 Lactic Acid, Venous(!!): 2.4 [MV]  2335 IMPRESSION: 1. Liver cirrhosis with evidence for portal hypertension. 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 2. Left colon diverticular disease without acute inflammatory process.   [MV]    Clinical Course User Index [MV] Eustaquio Maize, PA-C   MDM Rules/Calculators/A&P                      85 year old male who presents to the ED today for fever 102.2 started today. Recently discharged from hospital 2 days ago with hepatic encephalopathy. Noted ammonia at 170 at time of admission. There was concern for compliance with his lactulose. Most recent ammonia prior to discharge 45. Patient  states he has been taking his lactulose twice daily. Patient has no other complaints at this time. He is alert  and oriented x3. He does appear very warm on exam today. He is nontachycardic. Denies any increase in abdominal distention and he has no abd TTP. Work-up for sepsis at this time. Will start on empiric abx to cover for unknown infection. Pt has been vaccinated for COVID 19.   CBC without leukocytosis. Hgb stable.  CMP with glucose 123, bicarb 21. Ast 88.  Lactic 2.4; pt receiving fluids.  U/a without infection CXR clear  Ammonia elevated at 92. Lactulose ordered.   Given unknown infection cause, CT scan ordered. Regardless feel pt will need to be admitted for increase in ammonia and fevers with his hx. Covid test has been ordered.   CT scan without acute findings. Does show diverticular disease without acute inflammatory process. Difficult to ascertain where pt's fever is stemming from.   Discussed case with hospitalist who agrees to accept patient for admission.   This note was prepared using Dragon voice recognition software and may include unintentional dictation errors due to the inherent limitations of voice recognition software.   Final Clinical Impression(s) / ED Diagnoses Final diagnoses:  Increased ammonia level  Fever, unspecified fever cause    Rx / DC Orders ED Discharge Orders    None          Eustaquio Maize, PA-C 07/01/19 2349    Truddie Hidden, MD 07/02/19 1008

## 2019-07-02 ENCOUNTER — Inpatient Hospital Stay (HOSPITAL_COMMUNITY): Payer: Medicare Other

## 2019-07-02 DIAGNOSIS — K7460 Unspecified cirrhosis of liver: Secondary | ICD-10-CM

## 2019-07-02 LAB — CBC
HCT: 38.5 % — ABNORMAL LOW (ref 39.0–52.0)
Hemoglobin: 13 g/dL (ref 13.0–17.0)
MCH: 33.3 pg (ref 26.0–34.0)
MCHC: 33.8 g/dL (ref 30.0–36.0)
MCV: 98.7 fL (ref 80.0–100.0)
Platelets: 74 10*3/uL — ABNORMAL LOW (ref 150–400)
RBC: 3.9 MIL/uL — ABNORMAL LOW (ref 4.22–5.81)
RDW: 14.9 % (ref 11.5–15.5)
WBC: 11.2 10*3/uL — ABNORMAL HIGH (ref 4.0–10.5)
nRBC: 0 % (ref 0.0–0.2)

## 2019-07-02 LAB — LACTIC ACID, PLASMA: Lactic Acid, Venous: 1.9 mmol/L (ref 0.5–1.9)

## 2019-07-02 LAB — AMMONIA: Ammonia: 40 umol/L — ABNORMAL HIGH (ref 9–35)

## 2019-07-02 LAB — SARS CORONAVIRUS 2 (TAT 6-24 HRS): SARS Coronavirus 2: NEGATIVE

## 2019-07-02 MED ORDER — ESCITALOPRAM OXALATE 10 MG PO TABS
10.0000 mg | ORAL_TABLET | Freq: Every morning | ORAL | Status: DC
  Administered 2019-07-02 – 2019-07-17 (×16): 10 mg via ORAL
  Filled 2019-07-02 (×17): qty 1

## 2019-07-02 MED ORDER — HYDRALAZINE HCL 20 MG/ML IJ SOLN
5.0000 mg | INTRAMUSCULAR | Status: DC | PRN
  Administered 2019-07-03 – 2019-07-05 (×3): 5 mg via INTRAVENOUS
  Filled 2019-07-02 (×4): qty 1

## 2019-07-02 MED ORDER — LACTULOSE 10 GM/15ML PO SOLN
10.0000 g | Freq: Three times a day (TID) | ORAL | Status: DC
  Administered 2019-07-02: 12:00:00 10 g via ORAL

## 2019-07-02 MED ORDER — GADOBUTROL 1 MMOL/ML IV SOLN
9.0000 mL | Freq: Once | INTRAVENOUS | Status: AC | PRN
  Administered 2019-07-02: 9 mL via INTRAVENOUS

## 2019-07-02 MED ORDER — SODIUM CHLORIDE 0.9 % IV SOLN: INTRAVENOUS | Status: DC

## 2019-07-02 MED ORDER — ATENOLOL 25 MG PO TABS
25.0000 mg | ORAL_TABLET | Freq: Every morning | ORAL | Status: DC
  Administered 2019-07-02 – 2019-07-17 (×16): 25 mg via ORAL
  Filled 2019-07-02 (×17): qty 1

## 2019-07-02 MED ORDER — LACTULOSE 10 GM/15ML PO SOLN
20.0000 g | Freq: Three times a day (TID) | ORAL | Status: DC
  Administered 2019-07-02 – 2019-07-07 (×15): 20 g via ORAL
  Filled 2019-07-02 (×16): qty 30

## 2019-07-02 MED ORDER — LISINOPRIL 10 MG PO TABS
20.0000 mg | ORAL_TABLET | Freq: Two times a day (BID) | ORAL | Status: DC
  Administered 2019-07-02 – 2019-07-17 (×31): 20 mg via ORAL
  Filled 2019-07-02 (×2): qty 2
  Filled 2019-07-02: qty 4
  Filled 2019-07-02 (×29): qty 2

## 2019-07-02 MED ORDER — AMITRIPTYLINE HCL 50 MG PO TABS
25.0000 mg | ORAL_TABLET | Freq: Every day | ORAL | Status: DC
  Administered 2019-07-02 – 2019-07-16 (×15): 25 mg via ORAL
  Filled 2019-07-02 (×15): qty 1

## 2019-07-02 NOTE — Progress Notes (Addendum)
PROGRESS NOTE    Glen Finley  HGD:[id number] DOB: 1934/07/31 DOA: 07/01/2019 PCP: Shon Baton, MD     Brief Narrative:  Glen Finley is an 85 y.o. male with medical history significant of myasthenia gravis/progressive gait disorder not on any medications, Parkinson's versus Lewy body disease, bipolar, fatty liver disease with history of LFT elevation, migraine, hypertension, hyperlipidemia, GERD, chronic diastolic CHF presenting via EMS for evaluation of fever and altered mental status.  He was recently admitted to the hospital and discharged 06/30/19 after being treated for acute hepatic encephalopathy in the setting of lactulose noncompliance.  Right upper quadrant ultrasound done 4/7 showing cirrhosis. In the ED, work up was unrevealing for sepsis source. He was started on empiric vanco/cefepime/flagyl.   New events last 24 hours / Subjective: Patient alert and awake this morning, poor historian.  Fever 101 overnight.  States that he has been taking lactulose as prescribed, but denies having any bowel movements at home.  Assessment & Plan:   Principal Problem:   Sepsis ([place]) Active Problems:   Hypertension   Myasthenia gravis without exacerbation ([place])   Acute hepatic encephalopathy   Liver cirrhosis (H[facility])   Sepsis, unclear etiology -Presented with fever and tachypnea -Chest x-ray negative -UA negative -CT abdomen pelvis without source of infection -COVID-19 negative -Continue broad-spectrum antibiotics for now, vancomycin, cefepime, Flagyl -Low suspicion for peritonitis as patient has no abdominal distention or abdominal pain, no ascites noted on CT -Blood culture pending -Check sinus xray, per wife patient's voice has changed and not his baseline  Acute hepatic encephalopathy, fatty liver disease, cirrhosis -Elevated ammonia level 92 -Continue lactulose - increase dose, patient has not had BM since Friday per wife's report despite taking 10g TID   Chronic diastolic  heart failure -Does not appear to be fluid overloaded at this time -Holding Lasix for now  Myasthenia gravis, progressive gait disorder -Follow-up with neurology outpatient  Hypertension -Continue lisinopril, atenolol  Depression -Continue amitriptyline, lexapro     DVT prophylaxis: SCD, no anticoagulation due to thrombocytopenia Code Status: Full code Family Communication: None at bedside, updated wife over the phone  Disposition Plan:  Status is: Inpatient  Remains inpatient appropriate because:Altered mental status, Ongoing diagnostic testing needed not appropriate for outpatient work up, IV treatments appropriate due to intensity of illness or inability to take PO and Inpatient level of care appropriate due to severity of illness  Dispo: The patient is from: Home              Anticipated d/c is to: Home              Anticipated d/c date is: 2 days              Patient currently is not medically stable to d/c.   Consultants:   None  Procedures:   None  Antimicrobials:  Anti-infectives (From admission, onward)   Start     Dose/Rate Route Frequency Ordered Stop   07/02/19 2300  vancomycin (VANCOREADY) IVPB 1500 mg/300 mL     1,500 mg 150 mL/hr over 120 Minutes Intravenous Every 24 hours 07/01/19 2223     07/02/19 0600  ceFEPIme (MAXIPIME) 2 g in sodium chloride 0.9 % 100 mL IVPB     2 g 200 mL/hr over 30 Minutes Intravenous Every 8 hours 07/01/19 2223     07/02/19 0600  metroNIDAZOLE (FLAGYL) IVPB 500 mg     500 mg 100 mL/hr over 60 Minutes Intravenous Every 8 hours 07/01/19 2350  07/01/19 2130  vancomycin (VANCOREADY) IVPB 1750 mg/350 mL     1,750 mg 175 mL/hr over 120 Minutes Intravenous  Once 07/01/19 2115 07/02/19 0235   07/01/19 2115  ceFEPIme (MAXIPIME) 2 g in sodium chloride 0.9 % 100 mL IVPB  Status:  Discontinued     2 g 200 mL/hr over 30 Minutes Intravenous Every 12 hours 07/01/19 2109 07/01/19 2223   07/01/19 2115  metroNIDAZOLE (FLAGYL) IVPB 500  mg     500 mg 100 mL/hr over 60 Minutes Intravenous  Once 07/01/19 2109 07/02/19 0025   07/01/19 2115  vancomycin (VANCOCIN) IVPB 1000 mg/200 mL premix  Status:  Discontinued     1,000 mg 200 mL/hr over 60 Minutes Intravenous  Once 07/01/19 2109 07/01/19 2115        Objective: Vitals:   07/02/19 0235 07/02/19 0300 07/02/19 0404 07/02/19 0754  BP:  (!) 156/65 (!) 156/65 (!) 152/64  Pulse:  69 70 71  Resp:  (!) 23 (!) 21 (!) 21  Temp: 99.7 F (37.6 C)  98.1 F (36.7 C) 98.8 F (37.1 C)  TempSrc: Oral  Oral Oral  SpO2:  92% 95% 96%    Intake/Output Summary (Last 24 hours) at 07/02/2019 0954 Last data filed at 07/02/2019 0758 Gross per 24 hour  Intake 1100 ml  Output 300 ml  Net 800 ml   There were no vitals filed for this visit.  Examination:  General exam: Appears calm and comfortable  Respiratory system: Clear to auscultation. Respiratory effort normal. No respiratory distress. No conversational dyspnea.  Cardiovascular system: S1 & S2 heard, RRR. No murmurs. No pedal edema. Gastrointestinal system: Abdomen is nondistended, soft and nontender. Normal bowel sounds heard. Central nervous system: Alert. No focal neurological deficits. Speech clear.  Extremities: Symmetric in appearance  Skin: No rashes, lesions or ulcers on exposed skin  Psychiatry: Poor historian overall  Data Reviewed: I have personally reviewed following labs and imaging studies  CBC: Recent Labs  Lab 06/28/19 1531 07/01/19 2057 07/02/19 0640  WBC 7.5 9.9 11.2*  NEUTROABS 3.7 6.7  --   HGB 15.8 14.8 13.0  HCT 45.7 43.6 38.5*  MCV 98.7 98.9 98.7  PLT 105* 94* 74*   Basic Metabolic Panel: Recent Labs  Lab 06/28/19 1531 06/29/19 0253 06/30/19 0805 07/01/19 2057  NA 143 144 140 137  K 4.4 4.9 3.6 4.0  CL 111 109 105 106  CO2 21* 24 23 21*  GLUCOSE 112* 96 102* 123*  BUN 18 17 23 22   CREATININE 1.08 1.01 1.12 1.02  CALCIUM 9.3 9.0 8.7* 8.9   GFR: Estimated Creatinine Clearance: 60.3  mL/min (by C-G formula based on SCr of 1.02 mg/dL). Liver Function Tests: Recent Labs  Lab 06/28/19 1531 06/29/19 0253 06/30/19 0805 07/01/19 2057  AST 77* 108* 64* 88*  ALT 33 36 28 32  ALKPHOS 80 71 63 66  BILITOT 2.0* 2.7* 3.2* 2.6*  PROT 6.3* 6.5 6.3* 6.2*  ALBUMIN 2.8* 2.9* 2.7* 2.6*   No results for input(s): LIPASE, AMYLASE in the last 168 hours. Recent Labs  Lab 06/28/19 1532 06/29/19 0253 07/01/19 2058 07/02/19 0640  AMMONIA 171* 45* 92* 40*   Coagulation Profile: Recent Labs  Lab 06/28/19 1531 06/29/19 0253 07/01/19 2057  INR 1.5* 1.4* 1.5*   Cardiac Enzymes: No results for input(s): CKTOTAL, CKMB, CKMBINDEX, TROPONINI in the last 168 hours. BNP (last 3 results) No results for input(s): PROBNP in the last 8760 hours. HbA1C: No results for input(s): HGBA1C in  the last 72 hours. CBG: Recent Labs  Lab 06/28/19 1546  GLUCAP 117*   Lipid Profile: No results for input(s): CHOL, HDL, LDLCALC, TRIG, CHOLHDL, LDLDIRECT in the last 72 hours. Thyroid Function Tests: No results for input(s): TSH, T4TOTAL, FREET4, T3FREE, THYROIDAB in the last 72 hours. Anemia Panel: No results for input(s): VITAMINB12, FOLATE, FERRITIN, TIBC, IRON, RETICCTPCT in the last 72 hours. Sepsis Labs: Recent Labs  Lab 07/01/19 2057 07/02/19 0009  LATICACIDVEN 2.4* 1.9    Recent Results (from the past 240 hour(s))  SARS CORONAVIRUS 2 (TAT 6-24 HRS) Nasopharyngeal Nasopharyngeal Swab     Status: None   Collection Time: 06/28/19  7:07 PM   Specimen: Nasopharyngeal Swab  Result Value Ref Range Status   SARS Coronavirus 2 NEGATIVE NEGATIVE Final    Comment: (NOTE) SARS-CoV-2 target nucleic acids are NOT DETECTED. The SARS-CoV-2 RNA is generally detectable in upper and lower respiratory specimens during the acute phase of infection. Negative results do not preclude SARS-CoV-2 infection, do not rule out co-infections with other pathogens, and should not be used as the sole basis  for treatment or other patient management decisions. Negative results must be combined with clinical observations, patient history, and epidemiological information. The expected result is Negative. Fact Sheet for Patients: SugarRoll.be Fact Sheet for Healthcare Providers: https://www.woods-mathews.com/ This test is not yet approved or cleared by the [place] FDA and  has been authorized for detection and/or diagnosis of SARS-CoV-2 by FDA under an Emergency Use Authorization (EUA). This EUA will remain  in effect (meaning this test can be used) for the duration of the COVID-19 declaration under Section 56 4(b)(1) of the Act, 21 U.S.C. section 360bbb-3(b)(1), unless the authorization is terminated or revoked sooner. Performed at [facility], [place] [address] [place], [place] [address]   SARS CORONAVIRUS 2 (TAT 6-24 HRS) Nasopharyngeal Nasopharyngeal Swab     Status: None   Collection Time: 07/01/19  9:51 PM   Specimen: Nasopharyngeal Swab  Result Value Ref Range Status   SARS Coronavirus 2 NEGATIVE NEGATIVE Final    Comment: (NOTE) SARS-CoV-2 target nucleic acids are NOT DETECTED. The SARS-CoV-2 RNA is generally detectable in upper and lower respiratory specimens during the acute phase of infection. Negative results do not preclude SARS-CoV-2 infection, do not rule out co-infections with other pathogens, and should not be used as the sole basis for treatment or other patient management decisions. Negative results must be combined with clinical observations, patient history, and epidemiological information. The expected result is Negative. Fact Sheet for Patients: SugarRoll.be Fact Sheet for Healthcare Providers: https://www.woods-mathews.com/ This test is not yet approved or cleared by the [place] FDA and  has been authorized for detection and/or diagnosis of SARS-CoV-2 by FDA under  an Emergency Use Authorization (EUA). This EUA will remain  in effect (meaning this test can be used) for the duration of the COVID-19 declaration under Section 56 4(b)(1) of the Act, 21 U.S.C. section 360bbb-3(b)(1), unless the authorization is terminated or revoked sooner. Performed at [facility], [place] [address] [place], [place] [address]       Radiology Studies: DG Chest 2 View  Result Date: 07/01/2019 CLINICAL DATA:  Altered mental status with fever EXAM: CHEST - 2 VIEW COMPARISON:  06/28/2019, CT chest 09/28/2011 FINDINGS: Mild cardiomegaly. No focal consolidation or pleural effusion. Aortic atherosclerosis. No pneumothorax IMPRESSION: 1. No acute focal airspace disease 2. Mild cardiomegaly Electronically Signed   By: Donavan Foil M.D.   On: 07/01/2019 21:29  CT Abdomen Pelvis W Contrast  Result Date: 07/01/2019 CLINICAL DATA:  Cirrhosis EXAM: CT ABDOMEN AND PELVIS WITH CONTRAST TECHNIQUE: Multidetector CT imaging of the abdomen and pelvis was performed using the standard protocol following bolus administration of intravenous contrast. CONTRAST:  174mL OMNIPAQUE IOHEXOL 300 MG/ML  SOLN COMPARISON:  Ultrasound 06/28/2019, CT 06/27/2014 FINDINGS: Lower chest: Lung bases demonstrate streaky atelectasis or scarring within the bases. No acute consolidation or pleural effusion. Borderline to mild cardiomegaly. Hepatobiliary: Cirrhotic morphology of the liver. Subcentimeter hypodensities too small to further characterize. No calcified gallstone or biliary dilatation Pancreas: Unremarkable. No pancreatic ductal dilatation or surrounding inflammatory changes. Spleen: Normal in size without focal abnormality. Adrenals/Urinary Tract: Adrenal glands are normal. Cortical scarring in the mid left kidney. Negative for hydronephrosis. The bladder is normal Stomach/Bowel: Stomach is within normal limits. Appendix appears normal. No evidence of bowel wall thickening, distention, or inflammatory  changes. Diverticular disease of the left colon without acute inflammatory change. Vascular/Lymphatic: Moderate aortic atherosclerosis without aneurysm. Subcentimeter retroperitoneal nodes. Cavernous transformation of the portal vein. Numerous varices and collateral vessels within the abdomen. Reproductive: Slightly enlarged prostate Other: Negative for free air or free fluid. Musculoskeletal: Chronic compression deformity of T12. IMPRESSION: 1. Liver cirrhosis with evidence for portal hypertension. 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 2. Left colon diverticular disease without acute inflammatory process. Electronically Signed   By: Donavan Foil M.D.   On: 07/01/2019 23:26      Scheduled Meds: . enoxaparin (LOVENOX) injection  40 mg Subcutaneous Daily   Continuous Infusions: . sodium chloride 125 mL/hr at 07/02/19 0043  . ceFEPime (MAXIPIME) IV 2 g (07/02/19 0500)  . metronidazole 500 mg (07/02/19 7654)  . vancomycin       LOS: 1 day      Time spent: 35 minutes   Dessa Phi, DO [facility] 07/02/2019, 9:54 AM   Available via Epic secure chat 7am-7pm After these hours, please refer to coverage provider listed on [email or web address]

## 2019-07-03 ENCOUNTER — Inpatient Hospital Stay (HOSPITAL_COMMUNITY): Payer: Medicare Other

## 2019-07-03 DIAGNOSIS — I5032 Chronic diastolic (congestive) heart failure: Secondary | ICD-10-CM

## 2019-07-03 DIAGNOSIS — G20 Parkinson's disease: Secondary | ICD-10-CM

## 2019-07-03 DIAGNOSIS — Z885 Allergy status to narcotic agent status: Secondary | ICD-10-CM

## 2019-07-03 DIAGNOSIS — G7000 Myasthenia gravis without (acute) exacerbation: Secondary | ICD-10-CM

## 2019-07-03 DIAGNOSIS — Z91018 Allergy to other foods: Secondary | ICD-10-CM

## 2019-07-03 DIAGNOSIS — J390 Retropharyngeal and parapharyngeal abscess: Secondary | ICD-10-CM

## 2019-07-03 DIAGNOSIS — K7290 Hepatic failure, unspecified without coma: Secondary | ICD-10-CM

## 2019-07-03 DIAGNOSIS — K760 Fatty (change of) liver, not elsewhere classified: Secondary | ICD-10-CM

## 2019-07-03 DIAGNOSIS — K7460 Unspecified cirrhosis of liver: Secondary | ICD-10-CM

## 2019-07-03 DIAGNOSIS — F319 Bipolar disorder, unspecified: Secondary | ICD-10-CM

## 2019-07-03 DIAGNOSIS — G43909 Migraine, unspecified, not intractable, without status migrainosus: Secondary | ICD-10-CM

## 2019-07-03 DIAGNOSIS — Z888 Allergy status to other drugs, medicaments and biological substances status: Secondary | ICD-10-CM

## 2019-07-03 DIAGNOSIS — K766 Portal hypertension: Secondary | ICD-10-CM

## 2019-07-03 LAB — CBC
HCT: 38.8 % — ABNORMAL LOW (ref 39.0–52.0)
Hemoglobin: 13 g/dL (ref 13.0–17.0)
MCH: 33.2 pg (ref 26.0–34.0)
MCHC: 33.5 g/dL (ref 30.0–36.0)
MCV: 99.2 fL (ref 80.0–100.0)
Platelets: 73 10*3/uL — ABNORMAL LOW (ref 150–400)
RBC: 3.91 MIL/uL — ABNORMAL LOW (ref 4.22–5.81)
RDW: 15 % (ref 11.5–15.5)
WBC: 9 10*3/uL (ref 4.0–10.5)
nRBC: 0 % (ref 0.0–0.2)

## 2019-07-03 LAB — COMPREHENSIVE METABOLIC PANEL
ALT: 23 U/L (ref 0–44)
AST: 49 U/L — ABNORMAL HIGH (ref 15–41)
Albumin: 2.2 g/dL — ABNORMAL LOW (ref 3.5–5.0)
Alkaline Phosphatase: 53 U/L (ref 38–126)
Anion gap: 7 (ref 5–15)
BUN: 21 mg/dL (ref 8–23)
CO2: 22 mmol/L (ref 22–32)
Calcium: 8.1 mg/dL — ABNORMAL LOW (ref 8.9–10.3)
Chloride: 109 mmol/L (ref 98–111)
Creatinine, Ser: 0.98 mg/dL (ref 0.61–1.24)
GFR calc Af Amer: >60 mL/min (ref >60)
GFR calc non Af Amer: >60 mL/min (ref >60)
Glucose, Bld: 92 mg/dL (ref 70–99)
Potassium: 3.9 mmol/L (ref 3.5–5.1)
Sodium: 138 mmol/L (ref 135–145)
Total Bilirubin: 3.7 mg/dL — ABNORMAL HIGH (ref 0.3–1.2)
Total Protein: 5.7 g/dL — ABNORMAL LOW (ref 6.5–8.1)

## 2019-07-03 MED ORDER — SODIUM CHLORIDE 0.9 % IV SOLN
2.0000 g | INTRAVENOUS | Status: DC
  Administered 2019-07-03 – 2019-07-04 (×2): 2 g via INTRAVENOUS
  Filled 2019-07-03: qty 2
  Filled 2019-07-03: qty 20
  Filled 2019-07-03: qty 2
  Filled 2019-07-03: qty 20

## 2019-07-03 MED ORDER — IOHEXOL 300 MG/ML  SOLN
75.0000 mL | Freq: Once | INTRAMUSCULAR | Status: AC | PRN
  Administered 2019-07-03: 75 mL via INTRAVENOUS

## 2019-07-03 NOTE — Consult Note (Signed)
[facility] for Infectious Disease    Date of Admission:  07/01/2019     Total days of antibiotics 3               Reason for Consult: Retropharyngeal abscess   Referring Provider: Maylene Roes Primary Care Provider: Shon Baton, MD   ASSESSMENT:  Glen Finley is an 85 y/o male with multiple medical conditions with worsening mental status and recent admission for hepatic encephalopathy found to have fever and retropharyngeal abscess. ENT recommending IR evaluation for possible aspiration. Blood culture on admission is without growth to date. On Day 3 of antimicrobial therapy with broad spectrum coverage with vancomycin, cefepime and metronidazole which is appropriate as this is likely to be a polymicrobial abscess.  Will narrow cefepime to ceftriaxone for now as risk for pseudomonas is low.   PLAN:  1. Continue vancomycin and metronidazole. 2. Change cefepime to ceftriaxone.  3. Therapeutic drug monitoring of renal function and vancomycin levels.  4. ENT recommending IR evaluation for possible aspiration.  5. Cirrhosis management per primary team.   Principal Problem:   Retropharyngeal abscess Active Problems:   Hypertension   Myasthenia gravis without exacerbation ([place])   Acute hepatic encephalopathy   Sepsis ([place])   Liver cirrhosis ([place])   . amitriptyline  25 mg Oral QHS  . atenolol  25 mg Oral q AM  . escitalopram  10 mg Oral q AM  . lactulose  20 g Oral TID  . lisinopril  20 mg Oral BID     HPI: Glen Finley is a 86 y.o. male with previous medical history of myasthenia gravis/progressive gait disorder, Parkinson's vs. Lewy body disease, bipolar, fatty liver disease, migraine, and chronic diastolic CHF admitted from home with fever and altered mental status.   Glen Finley was recently admitted from 4/7-4/9 with hepatic encephalopathy in the setting of fatty liver disease and cirrhosis. He was afebrile during this admission and without leukocytosis. Reinitiated  lactulose.  Glen Finley had been experiencing worsening altered mental status over the past few weeks prior to admission. Oriented to person, place and situation in the ED. Ammonia level was 171. Febrile in the ED with max temperature of 102.2 and elevated WBC count of 11.2. Admission blood cultures have been without growth to date. Chest x-ray without acute focal airspace disease; CT abdomen pelvis with liver cirrhosis and with portal hypertension and cavernous transformation of the portal vein and numerous varices; CT maxillofacial prevertebral/retropharyngel fluid or edema with reactive appearing right level 3 lymph nodes; MR cervical spine with retropharyngeal/prevertebral edema with associated phlegmonous change measuring up to 2 cm and no evidence of discitis or osteomyelitis.   Glen Finley continues to be febrile with max temperature of 100.8 in the last 24 hours. Currently on Day 3 of broad spectrum coverage with vancomycin, cefepime and metronidazole. Awaiting evaluation by ENT.  Review of Systems: Review of Systems  Constitutional: Negative for chills, fever and weight loss.  Respiratory: Negative for cough, shortness of breath and wheezing.   Cardiovascular: Negative for chest pain and leg swelling.  Gastrointestinal: Negative for abdominal pain, constipation, diarrhea, nausea and vomiting.  Skin: Negative for rash.     Past Medical History:  Diagnosis Date  . Abnormality of gait   . GERD (gastroesophageal reflux disease)   . Headache(784.0)   . Hyperlipidemia   . Hypertension   . Migraine   . Myasthenia gravis without exacerbation ([place])   . Obesity   . Other extrapyramidal  disease and abnormal movement disorder   . REM sleep behavior disorder   . Unspecified psychosis   . Weakness     Social History   Tobacco Use  . Smoking status: Never Smoker  . Smokeless tobacco: Never Used  Substance Use Topics  . Alcohol use: No    Alcohol/week: 0.0 standard drinks  . Drug use: No     History reviewed. No pertinent family history.  Allergies  Allergen Reactions  . Amlodipine Swelling and Other (See Comments)    CAUSED SEVERE SWELLING OF THE LEGS THAT REQUIRED A TRIP TO THE MD'S OFFICE  . Cherry Other (See Comments)    Migraines   . Onion Other (See Comments)    Can eat cooked onions ONLY (or, patient develops migraines)  . Codeine Rash    OBJECTIVE: Blood pressure (!) 148/117, pulse 71, temperature 98 F (36.7 C), temperature source Oral, resp. rate (!) 23, SpO2 97 %.  Physical Exam Constitutional:      General: He is not in acute distress.    Appearance: He is well-developed.     Comments: Lying in bed with head of bed elevated; lethargic but arousable   Cardiovascular:     Rate and Rhythm: Normal rate and regular rhythm.     Heart sounds: Normal heart sounds.  Pulmonary:     Effort: Pulmonary effort is normal.     Breath sounds: Normal breath sounds.  Skin:    General: Skin is warm and dry.  Neurological:     Mental Status: He is lethargic.     Lab Results Lab Results  Component Value Date   WBC 9.0 07/03/2019   HGB 13.0 07/03/2019   HCT 38.8 (L) 07/03/2019   MCV 99.2 07/03/2019   PLT 73 (L) 07/03/2019    Lab Results  Component Value Date   CREATININE 0.98 07/03/2019   BUN 21 07/03/2019   NA 138 07/03/2019   K 3.9 07/03/2019   CL 109 07/03/2019   CO2 22 07/03/2019    Lab Results  Component Value Date   ALT 23 07/03/2019   AST 49 (H) 07/03/2019   ALKPHOS 53 07/03/2019   BILITOT 3.7 (H) 07/03/2019     Microbiology: Recent Results (from the past 240 hour(s))  SARS CORONAVIRUS 2 (TAT 6-24 HRS) Nasopharyngeal Nasopharyngeal Swab     Status: None   Collection Time: 06/28/19  7:07 PM   Specimen: Nasopharyngeal Swab  Result Value Ref Range Status   SARS Coronavirus 2 NEGATIVE NEGATIVE Final    Comment: (NOTE) SARS-CoV-2 target nucleic acids are NOT DETECTED. The SARS-CoV-2 RNA is generally detectable in upper and  lower respiratory specimens during the acute phase of infection. Negative results do not preclude SARS-CoV-2 infection, do not rule out co-infections with other pathogens, and should not be used as the sole basis for treatment or other patient management decisions. Negative results must be combined with clinical observations, patient history, and epidemiological information. The expected result is Negative. Fact Sheet for Patients: SugarRoll.be Fact Sheet for Healthcare Providers: https://www.woods-mathews.com/ This test is not yet approved or cleared by the [place] FDA and  has been authorized for detection and/or diagnosis of SARS-CoV-2 by FDA under an Emergency Use Authorization (EUA). This EUA will remain  in effect (meaning this test can be used) for the duration of the COVID-19 declaration under Section 56 4(b)(1) of the Act, 21 U.S.C. section 360bbb-3(b)(1), unless the authorization is terminated or revoked sooner. Performed at [facility], [facility], [address] [place], [place] [address]   Culture, blood (Routine x 2)     Status: None (Preliminary result)   Collection Time: 07/01/19  8:48 PM   Specimen: BLOOD  Result Value Ref Range Status   Specimen Description BLOOD RIGHT FOREARM  Final   Special Requests   Final    BOTTLES DRAWN AEROBIC AND ANAEROBIC Blood Culture adequate volume   Culture   Final    NO GROWTH 2 DAYS Performed at [facility], [place] [address] [place], [place] [address]    Report Status PENDING  Incomplete  Culture, blood (Routine x 2)     Status: None (Preliminary result)   Collection Time: 07/01/19  8:58 PM   Specimen: BLOOD  Result Value Ref Range Status   Specimen Description BLOOD LEFT FOREARM  Final   Special Requests   Final    BOTTLES DRAWN AEROBIC AND ANAEROBIC Blood Culture adequate volume   Culture   Final    NO GROWTH 2 DAYS Performed at [facility], [place] [address],  [place], [place] [address]    Report Status PENDING  Incomplete  SARS CORONAVIRUS 2 (TAT 6-24 HRS) Nasopharyngeal Nasopharyngeal Swab     Status: None   Collection Time: 07/01/19  9:51 PM   Specimen: Nasopharyngeal Swab  Result Value Ref Range Status   SARS Coronavirus 2 NEGATIVE NEGATIVE Final    Comment: (NOTE) SARS-CoV-2 target nucleic acids are NOT DETECTED. The SARS-CoV-2 RNA is generally detectable in upper and lower respiratory specimens during the acute phase of infection. Negative results do not preclude SARS-CoV-2 infection, do not rule out co-infections with other pathogens, and should not be used as the sole basis for treatment or other patient management decisions. Negative results must be combined with clinical observations, patient history, and epidemiological information. The expected result is Negative. Fact Sheet for Patients: SugarRoll.be Fact Sheet for Healthcare Providers: https://www.woods-mathews.com/ This test is not yet approved or cleared by the [place] FDA and  has been authorized for detection and/or diagnosis of SARS-CoV-2 by FDA under an Emergency Use Authorization (EUA). This EUA will remain  in effect (meaning this test can be used) for the duration of the COVID-19 declaration under Section 56 4(b)(1) of the Act, 21 U.S.C. section 360bbb-3(b)(1), unless the authorization is terminated or revoked sooner. Performed at [facility], [place] [address], [place], [place] [address]      Terri Piedra, [facility]  07/03/2019  1:36 PM

## 2019-07-03 NOTE — Evaluation (Signed)
Occupational Therapy Evaluation Patient Details Name: Glen Finley MRN: [id number] DOB: 06/09/1934 Today's Date: 07/03/2019    History of Present Illness Glen Finley is an 85 y.o. 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  He was recently admitted to the hospital and discharged 06/30/19 after being treated for acute hepatic encephalopathy.   Clinical Impression   Pt PTA: pt living at home with spouse and reports near independence with ADL and mobility.Pt currently, minguardA level overall for ADL and mobility with RW. Pt's O2 fluctuating 80s-90s with activity on 2L O2 with questionable pleth at times. Pt running into obstacles on L side- only L side. Pt having difficulty negotiating obstacles provided without bumping into objects. Pt with decreased activity tolerance and decreased ability to problem solve to avoid obstacles. Pt would benefit from continued OT skilled services. OT following acutely.    Follow Up Recommendations  Home health OT;Supervision/Assistance - 24 hour    Equipment Recommendations  3 in 1 bedside commode    Recommendations for Other Services       Precautions / Restrictions Precautions Precautions: Fall Restrictions Weight Bearing Restrictions: No      Mobility Bed Mobility Overal bed mobility: Needs Assistance Bed Mobility: Supine to Sit     Supine to sit: Min assist     General bed mobility comments: MinA for trunk elevation  Transfers Overall transfer level: Needs assistance Equipment used: Rolling walker (2 wheeled) Transfers: Sit to/from [facility] Sit to Stand: Min guard Stand pivot transfers: Min guard       General transfer comment: Cues for hand  placement    Balance Overall balance assessment: Needs assistance Sitting-balance support: No upper extremity supported;Feet supported Sitting balance-Leahy Scale: Fair     Standing balance support: Bilateral upper extremity supported;During functional activity Standing balance-Leahy Scale: Poor Standing balance comment: Relies on UE support                           ADL either performed or assessed with clinical judgement   ADL Overall ADL's : Needs assistance/impaired Eating/Feeding: Set up;Sitting   Grooming: Supervision/safety;Standing Grooming Details (indicate cue type and reason): stood x10 mins for ADL Upper Body Bathing: Set up;Standing   Lower Body Bathing: Min guard;Cueing for safety;Cueing for sequencing;Sit to/from stand;Sitting/lateral leans   Upper Body Dressing : Set up;Sitting   Lower Body Dressing: Min guard;Sitting/lateral leans;Sit to/from stand;Cueing for safety   Toilet Transfer: Min guard;RW   Toileting- Clothing Manipulation and Hygiene: Min guard;Sitting/lateral lean;Sit to/from stand       Functional mobility during ADLs: Minimal assistance;Rolling walker;Cueing for safety(Pt requiring assist to avoid obstacles on L side) General ADL Comments: Pt with decreased activity tolerance and decreased ability to problem solve to avoid obstacles.     Vision Baseline Vision/History: Wears glasses Wears Glasses: At all times Patient Visual Report: No change from baseline Vision Assessment?: No apparent visual deficits     Perception     Praxis      Pertinent Vitals/Pain       Hand Dominance Left   Extremity/Trunk Assessment Upper Extremity Assessment Upper Extremity Assessment: Generalized weakness   Lower Extremity Assessment Lower Extremity Assessment: Generalized weakness   Cervical / Trunk Assessment Cervical / Trunk Assessment: Kyphotic  Communication Communication Communication: No difficulties   Cognition  Arousal/Alertness: Awake/alert Behavior During Therapy: WFL for tasks assessed/performed Overall Cognitive Status: History of cognitive impairments - at baseline                                 General Comments: Pt alert and oriented x 4. Pt describing PLOF and it is confirmed from previous stay charting.   General Comments  O2 fluctuating 80s-90s with activity on 2L O2 with questionable pleth at times. Pt running into obstacles on L side- only L side.    Exercises     Shoulder Instructions      Home Living Family/patient expects to be discharged to:: Private residence Living Arrangements: Spouse/significant other Available Help at Discharge: Family;Available 24 hours/day Type of Home: House Home Access: Stairs to enter [employer] of Steps: 3 Entrance Stairs-Rails: Right Home Layout: One level     Bathroom Shower/Tub: Teacher, early years/pre: Standard Bathroom Accessibility: Yes   Home Equipment: Walker - 2 wheels          Prior Functioning/Environment Level of Independence: Independent                 OT Problem List: Decreased strength;Decreased activity tolerance;Impaired balance (sitting and/or standing);Decreased safety awareness;Decreased cognition      OT Treatment/Interventions: Self-care/ADL training;Therapeutic exercise;Energy conservation;DME and/or AE instruction;Therapeutic activities;Cognitive remediation/compensation;Patient/family education;Balance training    OT Goals(Current goals can be found in the care plan section) Acute Rehab OT Goals Patient Stated Goal: to go home OT Goal Formulation: With patient Time For Goal Achievement: 07/17/19 Potential to Achieve Goals: Good ADL Goals Pt Will Perform Lower Body Dressing: with modified independence;sit to/from stand;sitting/lateral leans Additional ADL Goal #1: Pt will participate in 15 mins of ADL tasks with modified independence with 2 seated rest breaks in  order to increase activity tolerance Additional ADL Goal #2: Pt will avoid obstacles on L side with minimal cues to attend to L side with ambulation and functional tasks.  OT Frequency: Min 2X/week   Barriers to D/C:            Co-evaluation              AM-PAC OT "6 Clicks" Daily Activity     Outcome Measure Help from another person eating meals?: None Help from another person taking care of personal grooming?: A Little Help from another person toileting, which includes using toliet, bedpan, or urinal?: A Little Help from another person bathing (including washing, rinsing, drying)?: A Little Help from another person to put on and taking off regular upper body clothing?: A Little Help from another person to put on and taking off regular lower body clothing?: A Little 6 Click Score: 19   End of Session Equipment Utilized During Treatment: Gait belt;Rolling walker;Oxygen Nurse Communication: Mobility status  Activity Tolerance: Patient tolerated treatment well Patient left: in chair;with call bell/phone within reach;with chair alarm set  OT Visit Diagnosis: Unsteadiness on feet (R26.81);Muscle weakness (generalized) (M62.81)                Time: 5188-4166 OT Time Calculation (min): 35 min Charges:  OT General Charges $OT Visit: 1 Visit OT Evaluation $OT Eval Moderate Complexity: 1 Mod OT Treatments $Self Care/Home Management : 8-22 mins  Jefferey Pica, OTR/L Acute Rehabilitation Services Pager: [phone] Office: [phone]   Cheron Coryell C 07/03/2019, 4:45 PM

## 2019-07-03 NOTE — Evaluation (Signed)
Physical Therapy Evaluation Patient Details Name: Glen Finley MRN: [id number] DOB: June 22, 1934 Today's Date: 07/03/2019   History of Present Illness  Glen Finley is an 85 y.o. 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  He was recently admitted to the hospital and discharged 06/30/19 after being treated for acute hepatic encephalopathy.  Clinical Impression  Pt in OOB in recliner upon arrival of PT, agreeable to evaluation at this time. Prior to admission the pt was independent with use of RW, but limited in endurance.  The pt now presents with limitations in functional mobility, power, endurance, and stability due to above dx, and will continue to benefit from skilled PT to address these deficits. The pt was able to ambulate a short distance in the room with use of a RW and minG for safety as well as multiple cues on RW use and posture. The pt reports an RPE of 7/10 with 10 ft ambulation, and will continue to benefit from skilled PT for progressive strengthening and endurance training.      Follow Up Recommendations Home health PT;Supervision/Assistance - 24 hour    Equipment Recommendations  (Pt has RW, may benefit from 3-in-1)    Recommendations for Other Services       Precautions / Restrictions Precautions Precautions: Fall Restrictions Weight Bearing Restrictions: No      Mobility  Bed Mobility Overal bed mobility: Needs Assistance Bed Mobility: Supine to Sit     Supine to sit: Min assist     General bed mobility comments: pt OOB in recliner  Transfers Overall transfer level: Needs assistance Equipment used: Rolling walker (2 wheeled) Transfers: Sit to/from Stand Sit to Stand: Min assist;Min  guard Stand pivot transfers: Min guard       General transfer comment: cues for hand placement, increased to minA with some reps due to fatigue. 5X STS in 75 sec (>14.8 sec indicates increased risk of falls)  Ambulation/Gait Ambulation/Gait assistance: Min guard Gait Distance (Feet): 10 Feet(10 ftt x2 with seated rest after each bout) Assistive device: Rolling walker (2 wheeled) Gait Pattern/deviations: Step-to pattern;Decreased stride length;Decreased step length - right;Decreased step length - left   Gait velocity interpretation: <1.31 ft/sec, indicative of household ambulator General Gait Details: Pt able to walk in room with RW, hit multiple objects on L side but able to correct without cues. Cues to stay close to RW. Pt LLE in significant ER  Stairs            Wheelchair Mobility    Modified Rankin (Stroke Patients Only)       Balance Overall balance assessment: Needs assistance Sitting-balance support: No upper extremity supported;Feet supported Sitting balance-Leahy Scale: Fair     Standing balance support: Bilateral upper extremity supported;During functional activity Standing balance-Leahy Scale: Poor Standing balance comment: Relies on UE support                             Pertinent Vitals/Pain Pain Assessment: No/denies pain    Home Living Family/patient expects to be discharged to:: Private residence Living Arrangements: Spouse/significant other Available Help at Discharge: Family;Available 24 hours/day Type of Home: House Home Access: Stairs to enter Entrance Stairs-Rails: Right Entrance Stairs-Number of Steps: 3 Home Layout: One level Home Equipment: Gilford Rile -  2 wheels      Prior Function Level of Independence: Independent         Comments: decreased endurance at home     Hand Dominance   Dominant Hand: Left    Extremity/Trunk Assessment   Upper Extremity Assessment Upper Extremity Assessment: Defer to OT evaluation     Lower Extremity Assessment Lower Extremity Assessment: Generalized weakness    Cervical / Trunk Assessment Cervical / Trunk Assessment: Kyphotic  Communication   Communication: No difficulties  Cognition Arousal/Alertness: Awake/alert Behavior During Therapy: WFL for tasks assessed/performed Overall Cognitive Status: History of cognitive impairments - at baseline                                 General Comments: Pt alert and oriented x 4. Pt describing PLOF and it is confirmed from previous stay charting.      General Comments General comments (skin integrity, edema, etc.): O2 fluctuating 80s-90s with activity on 2L O2 with questionable pleth at times. Pt running into obstacles on L side- only L side. Pt having difficulty negotiating obstacles provided without bumping into objects.    Exercises     Assessment/Plan    PT Assessment Patient needs continued PT services  PT Problem List Decreased activity tolerance;Decreased balance;Decreased mobility;Decreased knowledge of use of DME;Decreased safety awareness;Decreased knowledge of precautions       PT Treatment Interventions DME instruction;Gait training;Functional mobility training;Therapeutic activities;Therapeutic exercise;Balance training;Patient/family education;Stair training    PT Goals (Current goals can be found in the Care Plan section)  Acute Rehab PT Goals Patient Stated Goal: to go home PT Goal Formulation: With patient Time For Goal Achievement: 07/17/19 Potential to Achieve Goals: Good    Frequency Min 3X/week   Barriers to discharge        Co-evaluation               AM-PAC PT "6 Clicks" Mobility  Outcome Measure Help needed turning from your back to your side while in a flat bed without using bedrails?: A Little Help needed moving from lying on your back to sitting on the side of a flat bed without using bedrails?: A Little Help needed moving to and from a bed to a chair  (including a wheelchair)?: A Little Help needed standing up from a chair using your arms (e.g., wheelchair or bedside chair)?: A Little Help needed to walk in hospital room?: A Little Help needed climbing 3-5 steps with a railing? : A Lot 6 Click Score: 17    End of Session Equipment Utilized During Treatment: Gait belt;Oxygen Activity Tolerance: Patient limited by fatigue Patient left: in chair;with call bell/phone within reach;with chair alarm set Nurse Communication: Mobility status PT Visit Diagnosis: Unsteadiness on feet (R26.81);Muscle weakness (generalized) (M62.81)    Time: 3570-1779 PT Time Calculation (min) (ACUTE ONLY): 36 min   Charges:   PT Evaluation $PT Eval Moderate Complexity: 1 Mod PT Treatments $Gait Training: 8-22 mins        Karma Ganja, PT, DPT   Acute Rehabilitation Department Pager #: [phone]  Otho Bellows 07/03/2019, 12:34 PM

## 2019-07-03 NOTE — Consult Note (Signed)
OTOLARYNGOLOGY CONSULTATION  Referring Physician: Dr. Maylene Roes Primary Care Physician: Shon Baton, MD Patient Location at Initial Consult: Inpatient Chief Complaint/Reason for Consult: retropharyngeal fluid collection  History of Presenting Illness:    Glen Finley is a  85 y.o. male presenting with retropharyngeal fluid collection, first noted 2 days ago. He has frequent altered mental status 2/2 hepatic encephalopathy. Presented on admission with fever and AMS. CT maxillofacial done 2 days ago 2/2 hoarse voice. The patient's wife is at bedside and reports that his voice was "gravely sounding" and now is "almost normal." no dysphagia, no stridor. He is tolerating secretions. Has not been complaining of pain. WBC normal.   Past Medical History:  Diagnosis Date  . Abnormality of gait   . GERD (gastroesophageal reflux disease)   . Headache(784.0)   . Hyperlipidemia   . Hypertension   . Migraine   . Myasthenia gravis without exacerbation ([place])   . Obesity   . Other extrapyramidal disease and abnormal movement disorder   . REM sleep behavior disorder   . Unspecified psychosis   . Weakness     Past Surgical History:  Procedure Laterality Date  . CATARACT EXTRACTION Bilateral     History reviewed. No pertinent family history.  Social History   Socioeconomic History  . Marital status: Married    Spouse name: [name]  . Number of children: 2  . Years of education: 33  . Highest education level: Not on file  Occupational History  . Occupation: Network engineer.    Comment: Retired  Tobacco Use  . Smoking status: Never Smoker  . Smokeless tobacco: Never Used  Substance and Sexual Activity  . Alcohol use: No    Alcohol/week: 0.0 standard drinks  . Drug use: No  . Sexual activity: Never  Other Topics Concern  . Not on file  Social History Narrative   Patient lives at home with his wife [name]). Patient is retired. High school education.   Left handed.   Caffeine- One  cup daily.   Social Determinants of Health   Financial Resource Strain:   . Difficulty of Paying Living Expenses:   Food Insecurity:   . Worried About Charity fundraiser in the Last Year:   . Arboriculturist in the Last Year:   Transportation Needs:   . Film/video editor (Medical):   Marland Kitchen Lack of Transportation (Non-Medical):   Physical Activity:   . Days of Exercise per Week:   . Minutes of Exercise per Session:   Stress:   . Feeling of Stress :   Social Connections:   . Frequency of Communication with Friends and Family:   . Frequency of Social Gatherings with Friends and Family:   . Attends Religious Services:   . Active Member of Clubs or Organizations:   . Attends Archivist Meetings:   Marland Kitchen Marital Status:     No current facility-administered medications on file prior to encounter.   Current Outpatient Medications on File Prior to Encounter  Medication Sig Dispense Refill  . alendronate (FOSAMAX) 70 MG tablet Take 70 mg by mouth every Sunday.   3  . amitriptyline (ELAVIL) 25 MG tablet Take 25 mg by mouth at bedtime.    Marland Kitchen aspirin EC 81 MG tablet Take 81 mg by mouth See admin instructions. Take 81 mg by mouth at bedtime on Mon/Wed/Fri (only)    . atenolol (TENORMIN) 25 MG tablet Take 25 mg by mouth in the morning.     Marland Kitchen  Cholecalciferol (VITAMIN D-3) 25 MCG (1000 UT) CAPS Take 1,000 Units by mouth daily with breakfast.    . escitalopram (LEXAPRO) 10 MG tablet Take 10 mg by mouth in the morning.   5  . fish oil-omega-3 fatty acids 1000 MG capsule Take 1 g by mouth 2 (two) times daily.    . furosemide (LASIX) 20 MG tablet Take 20 mg by mouth daily as needed for fluid or edema.     Marland Kitchen lactulose (CHRONULAC) 10 GM/15ML solution Take 15 mLs (10 g total) by mouth 2 (two) times daily. 236 mL 2  . Liniments (SALONPAS ARTHRITIS PAIN RELIEF EX) Apply 1 application topically daily as needed (for right knee pain).    Marland Kitchen lisinopril (ZESTRIL) 20 MG tablet Take 20 mg by mouth 2  (two) times daily.    . Multiple Vitamins-Minerals (ONE-A-DAY MENS 50+) TABS Take 1 tablet by mouth daily with breakfast.    . Polyethyl Glyc-Propyl Glyc PF (SYSTANE ULTRA PF) 0.4-0.3 % SOLN Place 1 drop into both eyes 2 (two) times daily as needed (for eye redness or burning).      Allergies  Allergen Reactions  . Amlodipine Swelling and Other (See Comments)    CAUSED SEVERE SWELLING OF THE LEGS THAT REQUIRED A TRIP TO THE MD'S OFFICE  . Cherry Other (See Comments)    Migraines   . Onion Other (See Comments)    Can eat cooked onions ONLY (or, patient develops migraines)  . Codeine Rash     Review of Systems: ROS complete and negative except for the above mentioned.    OBJECTIVE: Vital Signs: Vitals:   07/03/19 0352 07/03/19 1223  BP: (!) 168/68 (!) 148/117  Pulse: 69 71  Resp: (!) 23   Temp: 98 F (36.7 C)   SpO2: 97% 97%    I&O  Intake/Output Summary (Last 24 hours) at 07/03/2019 1302 Last data filed at 07/03/2019 0800 Gross per 24 hour  Intake 120 ml  Output 1 ml  Net 119 ml    Physical Exam General: Well developed, well nourished. No acute distress. Voice without dysphonia.   Head/Face: Normocephalic, atraumatic. No scars or lesions. No sinus tenderness. Facial nerve intact and equal bilaterally.  No facial lacerations. Salivary glands non tender and without palpable masses  Eyes: Globes well positioned, no proptosis Lids: No periorbital edema/ecchymosis. No lid laceration Conjunctiva: No chemosis, hemorrhage PERRL Extra occular movement: Full ROM bilaterally. No gaze restriction    Ears: No gross deformity. Normal external canal. Tympanic membrane intact bilaterally and without effusion  Hearing:  Normal speech reception.  Nose: No gross deformity or lesions. No purulent discharge. Septum midline. No turbinate hypertrophy.  Mouth/Oropharynx: Lips without any lesions. Dentition normal. No mucosal lesions within the oropharynx. No tonsillar enlargement, exudate,  or lesions. Pharyngeal walls symmetrical. Uvula midline. Tongue midline without lesions.  Neck: Trachea midline. There is no restriction of neck motion in any direction. Lower right neck with firm not fluctuant mass which is indiscrete.  Lymphatic: No other lymphadenopathy in the neck.  Respiratory: No stridor or distress.  Cardiovascular: Regular rate and rhythm.  Extremities: No edema or cyanosis. Warm and well-perfused.  Skin: No scars or lesions on face or neck.  Neurologic: CN II-XII intact. Moving all extremities without gross abnormality.  Other:      Labs: Lab Results  Component Value Date   WBC 9.0 07/03/2019   HGB 13.0 07/03/2019   HCT 38.8 (L) 07/03/2019   PLT 73 (L) 07/03/2019   ALT  23 07/03/2019   AST 49 (H) 07/03/2019   NA 138 07/03/2019   K 3.9 07/03/2019   CL 109 07/03/2019   CREATININE 0.98 07/03/2019   BUN 21 07/03/2019   CO2 22 07/03/2019   TSH 3.520 10/12/2012   INR 1.5 (H) 07/01/2019     Review of Ancillary Data / Diagnostic Tests:  07/02/19 CT maxillofacial personally reviewed.  There was an indiscrete no rim-enhancing retropharyngeal fluid collection.  There did not appear to be anything drainable.  There was a little bit of sphenoid sinusitis which appear chronic with no acute concerning features. MR cervical spine report did demonstrate possible abscess but recommend CT soft tissue neck. 07/03/2019 CT neck with contrast personally reviewed and demonstrates a large, indiscrete right-sided thyroid mass versus abscess with some extension and residual retropharyngeal fluid collection again without significant rim-enhancement.  White blood cell count has decreased in the last 2 days.  ASSESSMENT:  85 y.o. male with retropharyngeal fluid collection.  Based on the CT neck with contrast today I am inclined to think this is reactive in nature.  There appears to be a possible right sided thyroid/neck mass (?) and the retropharyngeal collection may be reactive in  nature.   RECOMMENDATIONS: Recommend Interventional Radiology consultation to perform right inferior neck US- guided FNA and culture.  Will defer antibiotic management per primary team.  Consider decadron 10mg  x 3 doses if swelling progresses.  Will follow.    Gavin Pound, MD  [facility] [place], [place] Office phone [phone]

## 2019-07-03 NOTE — Progress Notes (Addendum)
PROGRESS NOTE    Glen Finley  JYN:[id number] DOB: February 25, 1935 DOA: 07/01/2019 PCP: Shon Baton, MD     Brief Narrative:  Glen Finley is an 85 y.o. male with medical history significant of myasthenia gravis/progressive gait disorder not on any medications, Parkinson's versus Lewy body disease, bipolar, fatty liver disease with history of LFT elevation, migraine, hypertension, hyperlipidemia, GERD, chronic diastolic CHF presenting via EMS for evaluation of fever and altered mental status.  He was recently admitted to the hospital and discharged 06/30/19 after being treated for acute hepatic encephalopathy in the setting of lactulose noncompliance.  Right upper quadrant ultrasound done 4/7 showing cirrhosis. In the ED, work up was unrevealing for sepsis source. He was started on empiric vanco/cefepime/flagyl.   New events last 24 hours / Subjective: Fever overnight 100.8. He voices no complaints today. Denies sore throat, difficulty swallowing. Does agree that his voice sounds different than normal. States he thinks he had a BM yesterday (nothing documented in I/Os).   Assessment & Plan:   Principal Problem:   Sepsis ([place]) Active Problems:   Hypertension   Myasthenia gravis without exacerbation ([place])   Acute hepatic encephalopathy   Liver cirrhosis ([facility])   Sepsis, secondary to retropharyngeal infection -Presented with fever and tachypnea -Chest x-ray negative -UA negative -CT abdomen pelvis without source of infection -COVID-19 negative -Blood culture negative to date  -CT maxillofacial: The dominant abnormality is prevertebral/retropharyngeal fluid or edema tracking which seems to increase towards the lower neck. Reactive appearing right level 3 lymph nodes. Visible cervical vertebrae appear intact, but in the setting of sepsis lower Cervical Discitis-Osteomyelitis is a leading consideration. Retropharyngeal infection is in the differential. Cervical Spine MRI without and with  contrast would be most valuable. -MRI cervical spine: Abnormal retropharyngeal/prevertebral edema with associated phlegmonous change within the lower right neck as above, concerning for acute infection. Probable superimposed abscess measuring up to approximately 2 cm, incompletely visualized on this exam. Further evaluation with dedicated soft tissue neck CT with contrast recommended for further evaluation. -CT soft tissue neck pending -ID and ENT consulted -Continue broad-spectrum antibiotics for now, vancomycin, cefepime, Flagyl  Acute hepatic encephalopathy, fatty liver disease, cirrhosis -Elevated ammonia level 92 on admission -Continue lactulose, monitor BM   Chronic diastolic heart failure -Does not appear to be fluid overloaded at this time -Holding Lasix for now  Myasthenia gravis, progressive gait disorder -Follow-up with neurology outpatient  Hypertension -Continue lisinopril, atenolol  Depression -Continue amitriptyline, lexapro     DVT prophylaxis: SCD, no anticoagulation due to thrombocytopenia Code Status: Full code Family Communication: None at bedside, updated wife over the phone  Disposition Plan:  Status is: Inpatient  Remains inpatient appropriate because:Ongoing diagnostic testing needed not appropriate for outpatient work up, IV treatments appropriate due to intensity of illness or inability to take PO and Inpatient level of care appropriate due to severity of illness  Dispo: The patient is from: Home              Anticipated d/c is to: Home              Anticipated d/c date is: > 3 days              Patient currently is not medically stable to d/c.   Consultants:   ID  ENT   Procedures:   None  Antimicrobials:  Anti-infectives (From admission, onward)   Start     Dose/Rate Route Frequency Ordered Stop   07/02/19 2300  vancomycin (VANCOREADY)  IVPB 1500 mg/300 mL     1,500 mg 150 mL/hr over 120 Minutes Intravenous Every 24 hours 07/01/19 2223      07/02/19 0600  ceFEPIme (MAXIPIME) 2 g in sodium chloride 0.9 % 100 mL IVPB     2 g 200 mL/hr over 30 Minutes Intravenous Every 8 hours 07/01/19 2223     07/02/19 0600  metroNIDAZOLE (FLAGYL) IVPB 500 mg     500 mg 100 mL/hr over 60 Minutes Intravenous Every 8 hours 07/01/19 2350     07/01/19 2130  vancomycin (VANCOREADY) IVPB 1750 mg/350 mL     1,750 mg 175 mL/hr over 120 Minutes Intravenous  Once 07/01/19 2115 07/02/19 0235   07/01/19 2115  ceFEPIme (MAXIPIME) 2 g in sodium chloride 0.9 % 100 mL IVPB  Status:  Discontinued     2 g 200 mL/hr over 30 Minutes Intravenous Every 12 hours 07/01/19 2109 07/01/19 2223   07/01/19 2115  metroNIDAZOLE (FLAGYL) IVPB 500 mg     500 mg 100 mL/hr over 60 Minutes Intravenous  Once 07/01/19 2109 07/02/19 0025   07/01/19 2115  vancomycin (VANCOCIN) IVPB 1000 mg/200 mL premix  Status:  Discontinued     1,000 mg 200 mL/hr over 60 Minutes Intravenous  Once 07/01/19 2109 07/01/19 2115       Objective: Vitals:   07/02/19 0754 07/02/19 1740 07/02/19 2036 07/03/19 0352  BP: (!) 152/64 (!) 143/61 (!) 149/60 (!) 168/68  Pulse: 71 61 62 69  Resp: (!) 21 (!) 24 (!) 26 (!) 23  Temp: 98.8 F (37.1 C) 99 F (37.2 C) (!) 100.8 F (38.2 C) 98 F (36.7 C)  TempSrc: Oral Axillary Axillary Oral  SpO2: 96% 95% 97% 97%    Intake/Output Summary (Last 24 hours) at 07/03/2019 1223 Last data filed at 07/03/2019 0800 Gross per 24 hour  Intake 120 ml  Output 1 ml  Net 119 ml   There were no vitals filed for this visit.   Examination: General exam: Appears calm and comfortable. Voice hoarse. Fatigued but arousable and oriented  Respiratory system: Clear to auscultation. Respiratory effort normal. Protecting airway Cardiovascular system: S1 & S2 heard, RRR. No pedal edema. Gastrointestinal system: Abdomen is nondistended, soft and nontender. Normal bowel sounds heard. Central nervous system: Alert and oriented to self, place, year. Non focal exam. Speech  clear  Extremities: Symmetric in appearance bilaterally  Skin: No rashes, lesions or ulcers on exposed skin    Data Reviewed: I have personally reviewed following labs and imaging studies  CBC: Recent Labs  Lab 06/28/19 1531 07/01/19 2057 07/02/19 0640 07/03/19 0440  WBC 7.5 9.9 11.2* 9.0  NEUTROABS 3.7 6.7  --   --   HGB 15.8 14.8 13.0 13.0  HCT 45.7 43.6 38.5* 38.8*  MCV 98.7 98.9 98.7 99.2  PLT 105* 94* 74* 73*   Basic Metabolic Panel: Recent Labs  Lab 06/28/19 1531 06/29/19 0253 06/30/19 0805 07/01/19 2057 07/03/19 0440  NA 143 144 140 137 138  K 4.4 4.9 3.6 4.0 3.9  CL 111 109 105 106 109  CO2 21* 24 23 21* 22  GLUCOSE 112* 96 102* 123* 92  BUN 18 17 23 22 21   CREATININE 1.08 1.01 1.12 1.02 0.98  CALCIUM 9.3 9.0 8.7* 8.9 8.1*   GFR: Estimated Creatinine Clearance: 62.8 mL/min (by C-G formula based on SCr of 0.98 mg/dL). Liver Function Tests: Recent Labs  Lab 06/28/19 1531 06/29/19 0253 06/30/19 0805 07/01/19 2057 07/03/19 0440  AST 77* 108*  64* 88* 49*  ALT 33 36 28 32 23  ALKPHOS 80 71 63 66 53  BILITOT 2.0* 2.7* 3.2* 2.6* 3.7*  PROT 6.3* 6.5 6.3* 6.2* 5.7*  ALBUMIN 2.8* 2.9* 2.7* 2.6* 2.2*   No results for input(s): LIPASE, AMYLASE in the last 168 hours. Recent Labs  Lab 06/28/19 1532 06/29/19 0253 07/01/19 2058 07/02/19 0640  AMMONIA 171* 45* 92* 40*   Coagulation Profile: Recent Labs  Lab 06/28/19 1531 06/29/19 0253 07/01/19 2057  INR 1.5* 1.4* 1.5*   Cardiac Enzymes: No results for input(s): CKTOTAL, CKMB, CKMBINDEX, TROPONINI in the last 168 hours. BNP (last 3 results) No results for input(s): PROBNP in the last 8760 hours. HbA1C: No results for input(s): HGBA1C in the last 72 hours. CBG: Recent Labs  Lab 06/28/19 1546  GLUCAP 117*   Lipid Profile: No results for input(s): CHOL, HDL, LDLCALC, TRIG, CHOLHDL, LDLDIRECT in the last 72 hours. Thyroid Function Tests: No results for input(s): TSH, T4TOTAL, FREET4, T3FREE,  THYROIDAB in the last 72 hours. Anemia Panel: No results for input(s): VITAMINB12, FOLATE, FERRITIN, TIBC, IRON, RETICCTPCT in the last 72 hours. Sepsis Labs: Recent Labs  Lab 07/01/19 2057 07/02/19 0009  LATICACIDVEN 2.4* 1.9    Recent Results (from the past 240 hour(s))  SARS CORONAVIRUS 2 (TAT 6-24 HRS) Nasopharyngeal Nasopharyngeal Swab     Status: None   Collection Time: 06/28/19  7:07 PM   Specimen: Nasopharyngeal Swab  Result Value Ref Range Status   SARS Coronavirus 2 NEGATIVE NEGATIVE Final    Comment: (NOTE) SARS-CoV-2 target nucleic acids are NOT DETECTED. The SARS-CoV-2 RNA is generally detectable in upper and lower respiratory specimens during the acute phase of infection. Negative results do not preclude SARS-CoV-2 infection, do not rule out co-infections with other pathogens, and should not be used as the sole basis for treatment or other patient management decisions. Negative results must be combined with clinical observations, patient history, and epidemiological information. The expected result is Negative. Fact Sheet for Patients: SugarRoll.be Fact Sheet for Healthcare Providers: https://www.woods-mathews.com/ This test is not yet approved or cleared by the [place] FDA and  has been authorized for detection and/or diagnosis of SARS-CoV-2 by FDA under an Emergency Use Authorization (EUA). This EUA will remain  in effect (meaning this test can be used) for the duration of the COVID-19 declaration under Section 56 4(b)(1) of the Act, 21 U.S.C. section 360bbb-3(b)(1), unless the authorization is terminated or revoked sooner. Performed at [facility], [place] [address], [place], [place] [address]   Culture, blood (Routine x 2)     Status: None (Preliminary result)   Collection Time: 07/01/19  8:48 PM   Specimen: BLOOD  Result Value Ref Range Status   Specimen Description BLOOD RIGHT FOREARM  Final   Special  Requests   Final    BOTTLES DRAWN AEROBIC AND ANAEROBIC Blood Culture adequate volume   Culture   Final    NO GROWTH 2 DAYS Performed at [facility], [place] [address] [place], [place] [address]    Report Status PENDING  Incomplete  Culture, blood (Routine x 2)     Status: None (Preliminary result)   Collection Time: 07/01/19  8:58 PM   Specimen: BLOOD  Result Value Ref Range Status   Specimen Description BLOOD LEFT FOREARM  Final   Special Requests   Final    BOTTLES DRAWN AEROBIC AND ANAEROBIC Blood Culture adequate volume   Culture   Final  NO GROWTH 2 DAYS Performed at [facility], [place] [address] [place], [place] [address]    Report Status PENDING  Incomplete  SARS CORONAVIRUS 2 (TAT 6-24 HRS) Nasopharyngeal Nasopharyngeal Swab     Status: None   Collection Time: 07/01/19  9:51 PM   Specimen: Nasopharyngeal Swab  Result Value Ref Range Status   SARS Coronavirus 2 NEGATIVE NEGATIVE Final    Comment: (NOTE) SARS-CoV-2 target nucleic acids are NOT DETECTED. The SARS-CoV-2 RNA is generally detectable in upper and lower respiratory specimens during the acute phase of infection. Negative results do not preclude SARS-CoV-2 infection, do not rule out co-infections with other pathogens, and should not be used as the sole basis for treatment or other patient management decisions. Negative results must be combined with clinical observations, patient history, and epidemiological information. The expected result is Negative. Fact Sheet for Patients: SugarRoll.be Fact Sheet for Healthcare Providers: https://www.woods-mathews.com/ This test is not yet approved or cleared by the [place] FDA and  has been authorized for detection and/or diagnosis of SARS-CoV-2 by FDA under an Emergency Use Authorization (EUA). This EUA will remain  in effect (meaning this test can be used) for the duration of the COVID-19 declaration under  Section 56 4(b)(1) of the Act, 21 U.S.C. section 360bbb-3(b)(1), unless the authorization is terminated or revoked sooner. Performed at [facility], [place] [address], [place], [place] [address]       Radiology Studies: DG Chest 2 View  Result Date: 07/01/2019 CLINICAL DATA:  Altered mental status with fever EXAM: CHEST - 2 VIEW COMPARISON:  06/28/2019, CT chest 09/28/2011 FINDINGS: Mild cardiomegaly. No focal consolidation or pleural effusion. Aortic atherosclerosis. No pneumothorax IMPRESSION: 1. No acute focal airspace disease 2. Mild cardiomegaly Electronically Signed   By: Donavan Foil M.D.   On: 07/01/2019 21:29   MR CERVICAL SPINE W WO CONTRAST  Result Date: 07/03/2019 CLINICAL DATA:  Initial evaluation for acute sepsis, unknown source, concern for discitis. EXAM: MRI CERVICAL SPINE WITHOUT AND WITH CONTRAST TECHNIQUE: Multiplanar and multiecho pulse sequences of the cervical spine, to include the craniocervical junction and cervicothoracic junction, were obtained without and with intravenous contrast. CONTRAST:  37mL GADAVIST GADOBUTROL 1 MMOL/ML IV SOLN COMPARISON:  Prior maxillofacial CT from earlier the same day. FINDINGS: Alignment: Mild straightening of the normal cervical lordosis. No listhesis. Vertebrae: Vertebral body height maintained without evidence for acute or chronic fracture. Bone marrow signal intensity somewhat diffusely decreased on T1 weighted imaging, nonspecific, but most commonly related to anemia, smoking, or obesity. No discrete or worrisome osseous lesions. Mild reactive endplate changes present about the C4-5 through C6-7 interspaces, degenerative in nature. No findings to suggest osteomyelitis discitis or septic arthritis. No abnormal marrow edema or enhancement. Cord: Signal intensity within the cervical spinal cord is normal. No abnormal enhancement. No epidural abscess or other collection. Posterior Fossa, vertebral arteries, paraspinal tissues: Age-related  cerebral atrophy noted within the visualized brain. Craniocervical junction normal. There is abnormal prevertebral/retropharyngeal edema extending from the nasopharynx through the upper thoracic spine. Abnormal edema with phlegmonous change seen involving the right parapharyngeal space, most pronounced at the level of the thyroid cartilage at approximately level III/IV (series 9, image 29). Finding concerning for acute infection given provided history. There is suggestion of a superimposed abscess at this level measuring up to approximately 2 cm (series 12, image 24), incompletely visualized. No frank loculated retropharyngeal abscess or collection. Prominent right level II/III lymph node measures up to 12 mm, likely reactive (  series 8, image 20). Normal flow void seen within the vertebral arteries bilaterally. Disc levels: C2-C3: Small central disc protrusion minimally indents the ventral thecal sac. No canal or foraminal stenosis. C3-C4: Moderate-sized central disc protrusion indents the ventral thecal sac, contacting and mildly flattening the ventral spinal cord. Resultant moderate spinal stenosis. Superimposed bilateral uncovertebral and facet hypertrophy. Resultant mild left greater than right C4 foraminal stenosis. C4-C5: Chronic intervertebral disc space narrowing with mild diffuse disc bulge. Right greater than left uncovertebral hypertrophy. Broad posterior disc osteophyte flattens and partially faces the ventral thecal sac, greater on the right. Resultant mild-to-moderate spinal stenosis with mild flattening of the right hemi cord. Severe right with moderate left C5 foraminal stenosis. C5-C6: Diffuse disc bulge with bilateral uncovertebral hypertrophy. Broad posterior component effaces the ventral thecal sac and resultant moderate spinal stenosis. Severe right with moderate left C6 foraminal narrowing. C6-C7: Diffuse disc bulge with bilateral uncovertebral hypertrophy. Mild ligament flavum thickening.  Resultant mild spinal stenosis without cord impingement. Moderate left worse than right C7 foraminal narrowing. C7-T1:  Negative interspace.  Mild facet hypertrophy.  No stenosis. Visualized upper thoracic spine demonstrates no significant finding. IMPRESSION: 1. Abnormal retropharyngeal/prevertebral edema with associated phlegmonous change within the lower right neck as above, concerning for acute infection. Probable superimposed abscess measuring up to approximately 2 cm, incompletely visualized on this exam. Further evaluation with dedicated soft tissue neck CT with contrast recommended for further evaluation. 2. No evidence for osteomyelitis discitis within the cervical spine. 3. Multilevel cervical spondylosis with resultant mild to moderate diffuse spinal stenosis at C3-4 through C6-7. Associated moderate to severe bilateral C5 through C7 foraminal narrowing as above. Electronically Signed   By: Jeannine Boga M.D.   On: 07/03/2019 03:29   CT Abdomen Pelvis W Contrast  Result Date: 07/01/2019 CLINICAL DATA:  Cirrhosis EXAM: CT ABDOMEN AND PELVIS WITH CONTRAST TECHNIQUE: Multidetector CT imaging of the abdomen and pelvis was performed using the standard protocol following bolus administration of intravenous contrast. CONTRAST:  129mL OMNIPAQUE IOHEXOL 300 MG/ML  SOLN COMPARISON:  Ultrasound 06/28/2019, CT 06/27/2014 FINDINGS: Lower chest: Lung bases demonstrate streaky atelectasis or scarring within the bases. No acute consolidation or pleural effusion. Borderline to mild cardiomegaly. Hepatobiliary: Cirrhotic morphology of the liver. Subcentimeter hypodensities too small to further characterize. No calcified gallstone or biliary dilatation Pancreas: Unremarkable. No pancreatic ductal dilatation or surrounding inflammatory changes. Spleen: Normal in size without focal abnormality. Adrenals/Urinary Tract: Adrenal glands are normal. Cortical scarring in the mid left kidney. Negative for hydronephrosis.  The bladder is normal Stomach/Bowel: Stomach is within normal limits. Appendix appears normal. No evidence of bowel wall thickening, distention, or inflammatory changes. Diverticular disease of the left colon without acute inflammatory change. Vascular/Lymphatic: Moderate aortic atherosclerosis without aneurysm. Subcentimeter retroperitoneal nodes. Cavernous transformation of the portal vein. Numerous varices and collateral vessels within the abdomen. Reproductive: Slightly enlarged prostate Other: Negative for free air or free fluid. Musculoskeletal: Chronic compression deformity of T12. IMPRESSION: 1. Liver cirrhosis with evidence for portal hypertension. 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 2. Left colon diverticular disease without acute inflammatory process. Electronically Signed   By: Donavan Foil M.D.   On: 07/01/2019 23:26   CT MAXILLOFACIAL WO CONTRAST  Result Date: 07/02/2019 CLINICAL DATA:  85 year old male. Sepsis. Sinus congestion. EXAM: CT MAXILLOFACIAL WITHOUT CONTRAST TECHNIQUE: Multidetector CT imaging of the maxillofacial structures was performed. Multiplanar CT image reconstructions were also generated. COMPARISON:  Head CT 06/28/2019, brain MRI 08/11/2011.  FINDINGS: Osseous: The visible cervical vertebrae appear intact (through C5) but there is abnormal prevertebral soft tissue swelling and retropharyngeal or prevertebral fluid/edema (series 3, image 16). This seems to progress caudally in the neck, and becomes eccentric to the right at the level of the thyroid cartilage on series 3, image 87. Mandible intact. No acute dental abnormality identified. Maxilla, zygoma, nasal bones, central skull base and visible calvarium appear intact. Orbits: Intact orbital walls. Postoperative changes to both globes, otherwise negative noncontrast orbits soft tissues. Sinuses: Posterior left ethmoid and left sphenoid sinus opacification  with periosteal thickening. But no complicating features are identified, and the remaining paranasal sinuses are well pneumatized. Tympanic cavities and mastoids are clear. Soft tissues: Abnormal prevertebral/retropharyngeal fluid or edema tracking inferiorly in the neck as stated above. There is also a partially retropharyngeal course of the right ICA. There are reactive appearing right level 3 lymph nodes on series 3, image 85. But elsewhere the noncontrast deep soft tissue spaces of the face and neck are within normal limits. Limited intracranial: Stable, No acute intracranial abnormality. Calcified atherosclerosis at the skull base. IMPRESSION: 1. The dominant abnormality is prevertebral/retropharyngeal fluid or edema tracking which seems to increase towards the lower neck. Reactive appearing right level 3 lymph nodes. Visible cervical vertebrae appear intact, but in the setting of sepsis lower Cervical Discitis-Osteomyelitis is a leading consideration. Retropharyngeal infection is in the differential. Cervical Spine MRI without and with contrast would be most valuable. Failing that Neck CT with IV contrast would be recommended. 2. Posterior left ethmoid and left sphenoid sinusitis, but has a chronic appearance with no complicating features. Electronically Signed   By: Genevie Ann M.D.   On: 07/02/2019 14:21      Scheduled Meds: . amitriptyline  25 mg Oral QHS  . atenolol  25 mg Oral q AM  . escitalopram  10 mg Oral q AM  . lactulose  20 g Oral TID  . lisinopril  20 mg Oral BID   Continuous Infusions: . ceFEPime (MAXIPIME) IV 2 g (07/03/19 0510)  . metronidazole 500 mg (07/03/19 0630)  . vancomycin 1,500 mg (07/02/19 2350)     LOS: 2 days      Time spent: 45 minutes   Dessa Phi, DO [facility] 07/03/2019, 12:23 PM   Available via Epic secure chat 7am-7pm After these hours, please refer to coverage provider listed on [email or web address]

## 2019-07-04 ENCOUNTER — Inpatient Hospital Stay (HOSPITAL_COMMUNITY): Payer: Medicare Other

## 2019-07-04 LAB — CBC
HCT: 38.4 % — ABNORMAL LOW (ref 39.0–52.0)
Hemoglobin: 12.9 g/dL — ABNORMAL LOW (ref 13.0–17.0)
MCH: 33.2 pg (ref 26.0–34.0)
MCHC: 33.6 g/dL (ref 30.0–36.0)
MCV: 99 fL (ref 80.0–100.0)
Platelets: 90 10*3/uL — ABNORMAL LOW (ref 150–400)
RBC: 3.88 MIL/uL — ABNORMAL LOW (ref 4.22–5.81)
RDW: 14.8 % (ref 11.5–15.5)
WBC: 8.3 10*3/uL (ref 4.0–10.5)
nRBC: 0 % (ref 0.0–0.2)

## 2019-07-04 LAB — COMPREHENSIVE METABOLIC PANEL
ALT: 23 U/L (ref 0–44)
AST: 48 U/L — ABNORMAL HIGH (ref 15–41)
Albumin: 2.1 g/dL — ABNORMAL LOW (ref 3.5–5.0)
Alkaline Phosphatase: 59 U/L (ref 38–126)
Anion gap: 9 (ref 5–15)
BUN: 17 mg/dL (ref 8–23)
CO2: 20 mmol/L — ABNORMAL LOW (ref 22–32)
Calcium: 7.8 mg/dL — ABNORMAL LOW (ref 8.9–10.3)
Chloride: 109 mmol/L (ref 98–111)
Creatinine, Ser: 0.82 mg/dL (ref 0.61–1.24)
GFR calc Af Amer: >60 mL/min (ref >60)
GFR calc non Af Amer: >60 mL/min (ref >60)
Glucose, Bld: 98 mg/dL (ref 70–99)
Potassium: 3.6 mmol/L (ref 3.5–5.1)
Sodium: 138 mmol/L (ref 135–145)
Total Bilirubin: 2.3 mg/dL — ABNORMAL HIGH (ref 0.3–1.2)
Total Protein: 5.5 g/dL — ABNORMAL LOW (ref 6.5–8.1)

## 2019-07-04 MED ORDER — VANCOMYCIN HCL 1750 MG/350ML IV SOLN
1750.0000 mg | INTRAVENOUS | Status: DC
  Administered 2019-07-04: 1750 mg via INTRAVENOUS
  Filled 2019-07-04: qty 350

## 2019-07-04 MED ORDER — LIDOCAINE HCL (PF) 1 % IJ SOLN
INTRAMUSCULAR | Status: AC
  Filled 2019-07-04: qty 30

## 2019-07-04 MED ORDER — HYDRALAZINE HCL 10 MG PO TABS
10.0000 mg | ORAL_TABLET | Freq: Three times a day (TID) | ORAL | Status: DC
  Administered 2019-07-04 – 2019-07-09 (×16): 10 mg via ORAL
  Filled 2019-07-04 (×17): qty 1

## 2019-07-04 NOTE — Progress Notes (Signed)
PROGRESS NOTE    Glen Finley  ESP:[id number] DOB: [birth year] DOA: 07/01/2019 PCP: Shon Baton, MD     Brief Narrative:  Glen Finley is an 85 y.o. male with medical history significant of myasthenia gravis/progressive gait disorder not on any medications, Parkinson's versus Lewy body disease, bipolar, fatty liver disease with history of LFT elevation, migraine, hypertension, hyperlipidemia, GERD, chronic diastolic CHF presenting via EMS for evaluation of fever and altered mental status.  He was recently admitted to the hospital and discharged 06/30/19 after being treated for acute hepatic encephalopathy in the setting of lactulose noncompliance.  Right upper quadrant ultrasound done 4/7 showing cirrhosis. In the ED, work up was unrevealing for sepsis source. He was started on empiric vanco/cefepime/flagyl.   Further work up revealed retropharyngeal fluid, possible right neck abscess. ID and ENT were consulted. Antibiotics narrowed to rocephin/vanco. IR consulted for fluid aspiration.   New events last 24 hours / Subjective: Feeling well, ate breakfast without any dysphagia or odynophagia.  States that he had 2 bowel movements.  No complaints on examination today.  Remains afebrile.  Assessment & Plan:   Principal Problem:   Retropharyngeal abscess Active Problems:   Hypertension   Myasthenia gravis without exacerbation ([place])   Acute hepatic encephalopathy   Sepsis ([place])   Liver cirrhosis ([facility])   Sepsis, secondary to retropharyngeal infection -Presented with fever and tachypnea -Chest x-ray negative -UA negative -CT abdomen pelvis without source of infection -COVID-19 negative -Blood culture negative to date  -CT maxillofacial: The dominant abnormality is prevertebral/retropharyngeal fluid or edema tracking which seems to increase towards the lower neck. Reactive appearing right level 3 lymph nodes. Visible cervical vertebrae appear intact, but in the setting of sepsis lower  Cervical Discitis-Osteomyelitis is a leading consideration. Retropharyngeal infection is in the differential. Cervical Spine MRI without and with contrast would be most valuable. -MRI cervical spine: Abnormal retropharyngeal/prevertebral edema with associated phlegmonous change within the lower right neck as above, concerning for acute infection. Probable superimposed abscess measuring up to approximately 2 cm, incompletely visualized on this exam. Further evaluation with dedicated soft tissue neck CT with contrast recommended for further evaluation. -CT soft tissue neck: Redemonstrated abnormal prevertebral/retropharyngeal fluid or edema extending from the C1-C2 level inferiorly to the C7 level. Extensive inflammatory stranding and phlegmonous change extending into the right neck soft tissues centered at the C5 level. Findings suspicious for an associated 3.0 x 2.0 abscess along the right posterior aspect of the thyroid cartilage at the C5 level. Heterogeneous enhancement of the right thyroid lobe suspicious for secondary thyroiditis. Findings likely reflect sequela of an infectious process.  -ID and ENT following  -Continue vancomycin, Rocephin -IR consulted for righted aspiration  Acute hepatic encephalopathy, fatty liver disease, cirrhosis -Elevated ammonia level 92 on admission -Continue lactulose, monitor BM  -Improved, alert and oriented x3  Chronic diastolic heart failure -Does not appear to be fluid overloaded at this time -Holding Lasix for now  Myasthenia gravis, progressive gait disorder -Follow-up with neurology outpatient  Hypertension -Continue lisinopril, atenolol -Add hydralazine due to elevated blood pressure  Depression -Continue amitriptyline, lexapro     DVT prophylaxis: SCD, no anticoagulation due to thrombocytopenia Code Status: Full code Family Communication: None at bedside, updated wife over the phone 4/11 and 4/12 Disposition Plan:  Status is:  Inpatient  Remains inpatient appropriate because:Ongoing diagnostic testing needed not appropriate for outpatient work up, IV treatments appropriate due to intensity of illness or inability to take PO and Inpatient level of care  appropriate due to severity of illness  Dispo: The patient is from: Home              Anticipated d/c is to: Home              Anticipated d/c date is: 2 days              Patient currently is not medically stable to d/c.   Consultants:   ID  ENT   IR  Procedures:   None  Antimicrobials:  Anti-infectives (From admission, onward)   Start     Dose/Rate Route Frequency Ordered Stop   07/04/19 2300  vancomycin (VANCOREADY) IVPB 1750 mg/350 mL     1,750 mg 175 mL/hr over 120 Minutes Intravenous Every 24 hours 07/04/19 0754     07/03/19 1600  cefTRIAXone (ROCEPHIN) 2 g in sodium chloride 0.9 % 100 mL IVPB     2 g 200 mL/hr over 30 Minutes Intravenous Every 24 hours 07/03/19 1437     07/02/19 2300  vancomycin (VANCOREADY) IVPB 1500 mg/300 mL  Status:  Discontinued     1,500 mg 150 mL/hr over 120 Minutes Intravenous Every 24 hours 07/01/19 2223 07/04/19 0754   07/02/19 0600  ceFEPIme (MAXIPIME) 2 g in sodium chloride 0.9 % 100 mL IVPB  Status:  Discontinued     2 g 200 mL/hr over 30 Minutes Intravenous Every 8 hours 07/01/19 2223 07/03/19 1437   07/02/19 0600  metroNIDAZOLE (FLAGYL) IVPB 500 mg     500 mg 100 mL/hr over 60 Minutes Intravenous Every 8 hours 07/01/19 2350     07/01/19 2130  vancomycin (VANCOREADY) IVPB 1750 mg/350 mL     1,750 mg 175 mL/hr over 120 Minutes Intravenous  Once 07/01/19 2115 07/02/19 0235   07/01/19 2115  ceFEPIme (MAXIPIME) 2 g in sodium chloride 0.9 % 100 mL IVPB  Status:  Discontinued     2 g 200 mL/hr over 30 Minutes Intravenous Every 12 hours 07/01/19 2109 07/01/19 2223   07/01/19 2115  metroNIDAZOLE (FLAGYL) IVPB 500 mg     500 mg 100 mL/hr over 60 Minutes Intravenous  Once 07/01/19 2109 07/02/19 0025   07/01/19 2115   vancomycin (VANCOCIN) IVPB 1000 mg/200 mL premix  Status:  Discontinued     1,000 mg 200 mL/hr over 60 Minutes Intravenous  Once 07/01/19 2109 07/01/19 2115       Objective: Vitals:   07/04/19 0410 07/04/19 0434 07/04/19 0524 07/04/19 0756  BP: (!) 191/75  (!) 165/69 (!) 150/71  Pulse: 79  79 63  Resp: (!) 24  (!) 26 18  Temp: 97.8 F (36.6 C)   99 F (37.2 C)  TempSrc: Oral   Oral  SpO2:   95% 100%  Weight:  90.6 kg      Intake/Output Summary (Last 24 hours) at 07/04/2019 1013 Last data filed at 07/04/2019 0957 Gross per 24 hour  Intake 830 ml  Output 1075 ml  Net -245 ml   Filed Weights   07/04/19 0434  Weight: 90.6 kg     Examination: General exam: Appears calm and comfortable  Respiratory system: Clear to auscultation. Respiratory effort normal. Cardiovascular system: S1 & S2 heard, RRR. No pedal edema. Gastrointestinal system: Abdomen is nondistended, soft and nontender. Normal bowel sounds heard. Central nervous system: Alert and oriented x3. Non focal exam. Speech clear  Extremities: Symmetric in appearance bilaterally  Skin: No rashes, lesions or ulcers on exposed skin  Psychiatry: Judgement and insight appear  stable. Mood & affect appropriate.     Data Reviewed: I have personally reviewed following labs and imaging studies  CBC: Recent Labs  Lab 06/28/19 1531 07/01/19 2057 07/02/19 0640 07/03/19 0440 07/04/19 0248  WBC 7.5 9.9 11.2* 9.0 8.3  NEUTROABS 3.7 6.7  --   --   --   HGB 15.8 14.8 13.0 13.0 12.9*  HCT 45.7 43.6 38.5* 38.8* 38.4*  MCV 98.7 98.9 98.7 99.2 99.0  PLT 105* 94* 74* 73* 90*   Basic Metabolic Panel: Recent Labs  Lab 06/29/19 0253 06/30/19 0805 07/01/19 2057 07/03/19 0440 07/04/19 0248  NA 144 140 137 138 138  K 4.9 3.6 4.0 3.9 3.6  CL 109 105 106 109 109  CO2 24 23 21* 22 20*  GLUCOSE 96 102* 123* 92 98  BUN 17 23 22 21 17   CREATININE 1.01 1.12 1.02 0.98 0.82  CALCIUM 9.0 8.7* 8.9 8.1* 7.8*   GFR: Estimated  Creatinine Clearance: 76 mL/min (by C-G formula based on SCr of 0.82 mg/dL). Liver Function Tests: Recent Labs  Lab 06/29/19 0253 06/30/19 0805 07/01/19 2057 07/03/19 0440 07/04/19 0248  AST 108* 64* 88* 49* 48*  ALT 36 28 32 23 23  ALKPHOS 71 63 66 53 59  BILITOT 2.7* 3.2* 2.6* 3.7* 2.3*  PROT 6.5 6.3* 6.2* 5.7* 5.5*  ALBUMIN 2.9* 2.7* 2.6* 2.2* 2.1*   No results for input(s): LIPASE, AMYLASE in the last 168 hours. Recent Labs  Lab 06/28/19 1532 06/29/19 0253 07/01/19 2058 07/02/19 0640  AMMONIA 171* 45* 92* 40*   Coagulation Profile: Recent Labs  Lab 06/28/19 1531 06/29/19 0253 07/01/19 2057  INR 1.5* 1.4* 1.5*   Cardiac Enzymes: No results for input(s): CKTOTAL, CKMB, CKMBINDEX, TROPONINI in the last 168 hours. BNP (last 3 results) No results for input(s): PROBNP in the last 8760 hours. HbA1C: No results for input(s): HGBA1C in the last 72 hours. CBG: Recent Labs  Lab 06/28/19 1546  GLUCAP 117*   Lipid Profile: No results for input(s): CHOL, HDL, LDLCALC, TRIG, CHOLHDL, LDLDIRECT in the last 72 hours. Thyroid Function Tests: No results for input(s): TSH, T4TOTAL, FREET4, T3FREE, THYROIDAB in the last 72 hours. Anemia Panel: No results for input(s): VITAMINB12, FOLATE, FERRITIN, TIBC, IRON, RETICCTPCT in the last 72 hours. Sepsis Labs: Recent Labs  Lab 07/01/19 2057 07/02/19 0009  LATICACIDVEN 2.4* 1.9    Recent Results (from the past 240 hour(s))  SARS CORONAVIRUS 2 (TAT 6-24 HRS) Nasopharyngeal Nasopharyngeal Swab     Status: None   Collection Time: 06/28/19  7:07 PM   Specimen: Nasopharyngeal Swab  Result Value Ref Range Status   SARS Coronavirus 2 NEGATIVE NEGATIVE Final    Comment: (NOTE) SARS-CoV-2 target nucleic acids are NOT DETECTED. The SARS-CoV-2 RNA is generally detectable in upper and lower respiratory specimens during the acute phase of infection. Negative results do not preclude SARS-CoV-2 infection, do not rule out co-infections  with other pathogens, and should not be used as the sole basis for treatment or other patient management decisions. Negative results must be combined with clinical observations, patient history, and epidemiological information. The expected result is Negative. Fact Sheet for Patients: SugarRoll.be Fact Sheet for Healthcare Providers: https://www.woods-mathews.com/ This test is not yet approved or cleared by the [place] FDA and  has been authorized for detection and/or diagnosis of SARS-CoV-2 by FDA under an Emergency Use Authorization (EUA). This EUA will remain  in effect (meaning this test can be used) for the duration of the COVID-19  declaration under Section 56 4(b)(1) of the Act, 21 U.S.C. section 360bbb-3(b)(1), unless the authorization is terminated or revoked sooner. Performed at [facility], [place] [address], [place], [place] [address]   Culture, blood (Routine x 2)     Status: None (Preliminary result)   Collection Time: 07/01/19  8:48 PM   Specimen: BLOOD  Result Value Ref Range Status   Specimen Description BLOOD RIGHT FOREARM  Final   Special Requests   Final    BOTTLES DRAWN AEROBIC AND ANAEROBIC Blood Culture adequate volume   Culture   Final    NO GROWTH 3 DAYS Performed at [facility], [place] [address], [place], [place] [address]    Report Status PENDING  Incomplete  Culture, blood (Routine x 2)     Status: None (Preliminary result)   Collection Time: 07/01/19  8:58 PM   Specimen: BLOOD  Result Value Ref Range Status   Specimen Description BLOOD LEFT FOREARM  Final   Special Requests   Final    BOTTLES DRAWN AEROBIC AND ANAEROBIC Blood Culture adequate volume   Culture   Final    NO GROWTH 3 DAYS Performed at [facility], [place] [address] [place], [place] [address]    Report Status PENDING  Incomplete  SARS CORONAVIRUS 2 (TAT 6-24 HRS) Nasopharyngeal Nasopharyngeal Swab     Status: None    Collection Time: 07/01/19  9:51 PM   Specimen: Nasopharyngeal Swab  Result Value Ref Range Status   SARS Coronavirus 2 NEGATIVE NEGATIVE Final    Comment: (NOTE) SARS-CoV-2 target nucleic acids are NOT DETECTED. The SARS-CoV-2 RNA is generally detectable in upper and lower respiratory specimens during the acute phase of infection. Negative results do not preclude SARS-CoV-2 infection, do not rule out co-infections with other pathogens, and should not be used as the sole basis for treatment or other patient management decisions. Negative results must be combined with clinical observations, patient history, and epidemiological information. The expected result is Negative. Fact Sheet for Patients: SugarRoll.be Fact Sheet for Healthcare Providers: https://www.woods-mathews.com/ This test is not yet approved or cleared by the [place] FDA and  has been authorized for detection and/or diagnosis of SARS-CoV-2 by FDA under an Emergency Use Authorization (EUA). This EUA will remain  in effect (meaning this test can be used) for the duration of the COVID-19 declaration under Section 56 4(b)(1) of the Act, 21 U.S.C. section 360bbb-3(b)(1), unless the authorization is terminated or revoked sooner. Performed at [facility], [place] [address], [place], [place] [address]       Radiology Studies: CT SOFT TISSUE NECK W CONTRAST  Result Date: 07/03/2019 CLINICAL DATA:  Provided INDICATION: Retropharyngeal/prevertebral edema, abscess, sepsis. EXAM: CT NECK WITH CONTRAST TECHNIQUE: Multidetector CT imaging of the neck was performed using the standard protocol following the bolus administration of intravenous contrast. CONTRAST:  41mL OMNIPAQUE IOHEXOL 300 MG/ML  SOLN COMPARISON:  Cervical spine MRI 07/02/2019, maxillofacial CT 07/02/2019. FINDINGS: Pharynx and larynx: Streak artifact from dental restoration limits evaluation of the oral cavity. There is  no appreciable discrete mass or swelling within the oral cavity or nasopharynx. Again demonstrated is abnormal prevertebral/retropharyngeal fluid or edema extending from the C1-C2 level inferiorly to the C7 level. Centered at the C5-C6 level there is prominent inflammatory stranding and phlegmon extending into the right neck soft tissues measuring 4.0 x 4.6 x 7.5 cm (for instance as seen on series 10, image 88 and series 8, image 44). Within this  region at the C5 level, there is a somewhat circumscribed region of hypoattenuation along the right posterior aspect of the thyroid cartilage measuring 3.0 x 2.0 cm in transaxial dimensions (series 10, image 74). Although no well-defined peripheral enhancement is demonstrated, findings are suspicious for early abscess formation. There is associated leftward deviation of the laryngeal airway and subglottic trachea without significant airway narrowing. There is rightward displacement of the right internal jugular vein and common carotid artery. There is heterogeneous enhancement of the right thyroid lobe suggestive of thyroiditis. Salivary glands: Unremarkable. Thyroid: Heterogeneous appearance of the right thyroid lobe with extensive surrounding phlegmonous change/abscess as described below. There are few coarse calcifications within the left thyroid lobe. Lymph nodes: Right cervical chain lymphadenopathy is likely reactive. Most notably a right level III lymph node measures 13 mm in short axis (series 8, image 30). Vascular: The major vascular structures of the neck appear patent. The right internal jugular vein is significantly narrowed at the level of the mid neck due to phlegmonous change/abscess. Limited intracranial: No abnormality identified. Visualized orbits: Excluded from the field of view. Mastoids and visualized paranasal sinuses: The paranasal sinuses are incompletely imaged. Redemonstrated complete opacification of the left sphenoid sinus with associated  reactive osteitis. No significant mastoid effusion. Skeleton: No acute bony abnormality. Redemonstrated cervical spondylosis with multilevel disc space narrowing, posterior disc osteophytes as well as uncovertebral hypertrophy. Upper chest: No consolidation within the imaged lung apices. IMPRESSION: Redemonstrated abnormal prevertebral/retropharyngeal fluid or edema extending from the C1-C2 level inferiorly to the C7 level. Extensive inflammatory stranding and phlegmonous change extending into the right neck soft tissues centered at the C5 level. Findings suspicious for an associated 3.0 x 2.0 abscess along the right posterior aspect of the thyroid cartilage at the C5 level. Heterogeneous enhancement of the right thyroid lobe suspicious for secondary thyroiditis. Findings likely reflect sequela of an infectious process. Close clinical follow-up is recommended with repeat imaging as warranted to exclude alternative etiologies (i.e. Neoplasm). Right cervical lymphadenopathy, likely reactive. Chronic left sphenoid sinusitis. Electronically Signed   By: Kellie Simmering DO   On: 07/03/2019 14:35   MR CERVICAL SPINE W WO CONTRAST  Result Date: 07/03/2019 CLINICAL DATA:  Initial evaluation for acute sepsis, unknown source, concern for discitis. EXAM: MRI CERVICAL SPINE WITHOUT AND WITH CONTRAST TECHNIQUE: Multiplanar and multiecho pulse sequences of the cervical spine, to include the craniocervical junction and cervicothoracic junction, were obtained without and with intravenous contrast. CONTRAST:  36mL GADAVIST GADOBUTROL 1 MMOL/ML IV SOLN COMPARISON:  Prior maxillofacial CT from earlier the same day. FINDINGS: Alignment: Mild straightening of the normal cervical lordosis. No listhesis. Vertebrae: Vertebral body height maintained without evidence for acute or chronic fracture. Bone marrow signal intensity somewhat diffusely decreased on T1 weighted imaging, nonspecific, but most commonly related to anemia, smoking, or  obesity. No discrete or worrisome osseous lesions. Mild reactive endplate changes present about the C4-5 through C6-7 interspaces, degenerative in nature. No findings to suggest osteomyelitis discitis or septic arthritis. No abnormal marrow edema or enhancement. Cord: Signal intensity within the cervical spinal cord is normal. No abnormal enhancement. No epidural abscess or other collection. Posterior Fossa, vertebral arteries, paraspinal tissues: Age-related cerebral atrophy noted within the visualized brain. Craniocervical junction normal. There is abnormal prevertebral/retropharyngeal edema extending from the nasopharynx through the upper thoracic spine. Abnormal edema with phlegmonous change seen involving the right parapharyngeal space, most pronounced at the level of the thyroid cartilage at approximately level III/IV (series 9, image 29). Finding concerning for acute  infection given provided history. There is suggestion of a superimposed abscess at this level measuring up to approximately 2 cm (series 12, image 24), incompletely visualized. No frank loculated retropharyngeal abscess or collection. Prominent right level II/III lymph node measures up to 12 mm, likely reactive (series 8, image 20). Normal flow void seen within the vertebral arteries bilaterally. Disc levels: C2-C3: Small central disc protrusion minimally indents the ventral thecal sac. No canal or foraminal stenosis. C3-C4: Moderate-sized central disc protrusion indents the ventral thecal sac, contacting and mildly flattening the ventral spinal cord. Resultant moderate spinal stenosis. Superimposed bilateral uncovertebral and facet hypertrophy. Resultant mild left greater than right C4 foraminal stenosis. C4-C5: Chronic intervertebral disc space narrowing with mild diffuse disc bulge. Right greater than left uncovertebral hypertrophy. Broad posterior disc osteophyte flattens and partially faces the ventral thecal sac, greater on the right.  Resultant mild-to-moderate spinal stenosis with mild flattening of the right hemi cord. Severe right with moderate left C5 foraminal stenosis. C5-C6: Diffuse disc bulge with bilateral uncovertebral hypertrophy. Broad posterior component effaces the ventral thecal sac and resultant moderate spinal stenosis. Severe right with moderate left C6 foraminal narrowing. C6-C7: Diffuse disc bulge with bilateral uncovertebral hypertrophy. Mild ligament flavum thickening. Resultant mild spinal stenosis without cord impingement. Moderate left worse than right C7 foraminal narrowing. C7-T1:  Negative interspace.  Mild facet hypertrophy.  No stenosis. Visualized upper thoracic spine demonstrates no significant finding. IMPRESSION: 1. Abnormal retropharyngeal/prevertebral edema with associated phlegmonous change within the lower right neck as above, concerning for acute infection. Probable superimposed abscess measuring up to approximately 2 cm, incompletely visualized on this exam. Further evaluation with dedicated soft tissue neck CT with contrast recommended for further evaluation. 2. No evidence for osteomyelitis discitis within the cervical spine. 3. Multilevel cervical spondylosis with resultant mild to moderate diffuse spinal stenosis at C3-4 through C6-7. Associated moderate to severe bilateral C5 through C7 foraminal narrowing as above. Electronically Signed   By: Jeannine Boga M.D.   On: 07/03/2019 03:29   CT MAXILLOFACIAL WO CONTRAST  Result Date: 07/02/2019 CLINICAL DATA:  85 year old male. Sepsis. Sinus congestion. EXAM: CT MAXILLOFACIAL WITHOUT CONTRAST TECHNIQUE: Multidetector CT imaging of the maxillofacial structures was performed. Multiplanar CT image reconstructions were also generated. COMPARISON:  Head CT 06/28/2019, brain MRI 08/11/2011. FINDINGS: Osseous: The visible cervical vertebrae appear intact (through C5) but there is abnormal prevertebral soft tissue swelling and retropharyngeal or  prevertebral fluid/edema (series 3, image 16). This seems to progress caudally in the neck, and becomes eccentric to the right at the level of the thyroid cartilage on series 3, image 87. Mandible intact. No acute dental abnormality identified. Maxilla, zygoma, nasal bones, central skull base and visible calvarium appear intact. Orbits: Intact orbital walls. Postoperative changes to both globes, otherwise negative noncontrast orbits soft tissues. Sinuses: Posterior left ethmoid and left sphenoid sinus opacification with periosteal thickening. But no complicating features are identified, and the remaining paranasal sinuses are well pneumatized. Tympanic cavities and mastoids are clear. Soft tissues: Abnormal prevertebral/retropharyngeal fluid or edema tracking inferiorly in the neck as stated above. There is also a partially retropharyngeal course of the right ICA. There are reactive appearing right level 3 lymph nodes on series 3, image 85. But elsewhere the noncontrast deep soft tissue spaces of the face and neck are within normal limits. Limited intracranial: Stable, No acute intracranial abnormality. Calcified atherosclerosis at the skull base. IMPRESSION: 1. The dominant abnormality is prevertebral/retropharyngeal fluid or edema tracking which seems to increase towards the lower neck.  Reactive appearing right level 3 lymph nodes. Visible cervical vertebrae appear intact, but in the setting of sepsis lower Cervical Discitis-Osteomyelitis is a leading consideration. Retropharyngeal infection is in the differential. Cervical Spine MRI without and with contrast would be most valuable. Failing that Neck CT with IV contrast would be recommended. 2. Posterior left ethmoid and left sphenoid sinusitis, but has a chronic appearance with no complicating features. Electronically Signed   By: Genevie Ann M.D.   On: 07/02/2019 14:21      Scheduled Meds: . amitriptyline  25 mg Oral QHS  . atenolol  25 mg Oral q AM  .  escitalopram  10 mg Oral q AM  . hydrALAZINE  10 mg Oral Q8H  . lactulose  20 g Oral TID  . lisinopril  20 mg Oral BID   Continuous Infusions: . cefTRIAXone (ROCEPHIN)  IV 2 g (07/03/19 1759)  . metronidazole 500 mg (07/04/19 0528)  . vancomycin       LOS: 3 days      Time spent: 25 minutes   Dessa Phi, DO [facility] 07/04/2019, 10:13 AM   Available via Epic secure chat 7am-7pm After these hours, please refer to coverage provider listed on [email or web address]

## 2019-07-04 NOTE — Progress Notes (Signed)
B.P. 191/75,p-79 Hydralazine 5 mg I.V. Given

## 2019-07-04 NOTE — Progress Notes (Signed)
Patient B.P. 175/77 P-76. Hydralazine 5 mg I.V. given

## 2019-07-04 NOTE — Progress Notes (Signed)
Chief Complaint: Patient was seen in consultation today for right neck aspiration.  Referring Physician(s): Dr. Maylene Roes  Supervising Physician: Markus Daft  Patient Status: [facility] - In-pt  History of Present Illness: Glen Finley is a 85 y.o. male being worked up for retropharyngeal/thyroid abscess. ENT was consulted and recommends IR perform image guided aspiration for sampling and culture. PMHx, meds, labs, imaging, allergies reviewed. Feels well, no recent fevers, chills, illness.    Past Medical History:  Diagnosis Date  . Abnormality of gait   . GERD (gastroesophageal reflux disease)   . Headache(784.0)   . Hyperlipidemia   . Hypertension   . Migraine   . Myasthenia gravis without exacerbation ([place])   . Obesity   . Other extrapyramidal disease and abnormal movement disorder   . REM sleep behavior disorder   . Unspecified psychosis   . Weakness     Past Surgical History:  Procedure Laterality Date  . CATARACT EXTRACTION Bilateral     Allergies: Amlodipine, Cherry, Onion, and Codeine  Medications:  Current Facility-Administered Medications:  .  amitriptyline (ELAVIL) tablet 25 mg, 25 mg, Oral, QHS, Dessa Phi, DO, 25 mg at 07/03/19 2140 .  atenolol (TENORMIN) tablet 25 mg, 25 mg, Oral, q AM, Dessa Phi, DO, 25 mg at 07/04/19 0608 .  cefTRIAXone (ROCEPHIN) 2 g in sodium chloride 0.9 % 100 mL IVPB, 2 g, Intravenous, Q24H, [place], FNP, Last Rate: 200 mL/hr at 07/03/19 1759, 2 g at 07/03/19 1759 .  escitalopram (LEXAPRO) tablet 10 mg, 10 mg, Oral, q AM, Dessa Phi, DO, 10 mg at 07/04/19 0608 .  hydrALAZINE (APRESOLINE) injection 5 mg, 5 mg, Intravenous, Q4H PRN, Shela Leff, MD, 5 mg at 07/04/19 0413 .  hydrALAZINE (APRESOLINE) tablet 10 mg, 10 mg, Oral, Q8H, Dessa Phi, DO, 10 mg at 07/04/19 0823 .  lactulose (CHRONULAC) 10 GM/15ML solution 20 g, 20 g, Oral, TID, Dessa Phi, DO, 20 g at 07/04/19 0928 .  lisinopril (ZESTRIL)  tablet 20 mg, 20 mg, Oral, BID, Dessa Phi, DO, 20 mg at 07/04/19 8527 .  metroNIDAZOLE (FLAGYL) IVPB 500 mg, 500 mg, Intravenous, Q8H, Shela Leff, MD, Last Rate: 100 mL/hr at 07/04/19 0528, 500 mg at 07/04/19 0528 .  vancomycin (VANCOREADY) IVPB 1750 mg/350 mL, 1,750 mg, Intravenous, Q24H, Einar Grad, [facility]    History reviewed. No pertinent family history.  Social History   Socioeconomic History  . Marital status: Married    Spouse name: [name]  . Number of children: 2  . Years of education: 65  . Highest education level: Not on file  Occupational History  . Occupation: Network engineer.    Comment: Retired  Tobacco Use  . Smoking status: Never Smoker  . Smokeless tobacco: Never Used  Substance and Sexual Activity  . Alcohol use: No    Alcohol/week: 0.0 standard drinks  . Drug use: No  . Sexual activity: Never  Other Topics Concern  . Not on file  Social History Narrative   Patient lives at home with his wife [name]). Patient is retired. High school education.   Left handed.   Caffeine- One cup daily.   Social Determinants of Health   Financial Resource Strain:   . Difficulty of Paying Living Expenses:   Food Insecurity:   . Worried About Charity fundraiser in the Last Year:   . Arboriculturist in the Last Year:   Transportation Needs:   . Film/video editor (Medical):   Marland Kitchen  Lack of Transportation (Non-Medical):   Physical Activity:   . Days of Exercise per Week:   . Minutes of Exercise per Session:   Stress:   . Feeling of Stress :   Social Connections:   . Frequency of Communication with Friends and Family:   . Frequency of Social Gatherings with Friends and Family:   . Attends Religious Services:   . Active Member of Clubs or Organizations:   . Attends Archivist Meetings:   Marland Kitchen Marital Status:      Review of Systems: A 12 point ROS discussed and pertinent positives are indicated in the HPI above.  All other systems are  negative.  Review of Systems  Vital Signs: BP (!) 150/71 (BP Location: Left Arm)   Pulse 63   Temp 99 F (37.2 C) (Oral)   Resp 18   Wt 90.6 kg   SpO2 100%   BMI 29.50 kg/m   Physical Exam Constitutional:      Appearance: Normal appearance.  HENT:     Mouth/Throat:     Mouth: Mucous membranes are moist.     Pharynx: Oropharynx is clear.  Cardiovascular:     Rate and Rhythm: Normal rate and regular rhythm.     Heart sounds: Normal heart sounds.  Pulmonary:     Effort: Pulmonary effort is normal. No respiratory distress.     Breath sounds: Normal breath sounds.  Abdominal:     General: Abdomen is flat.     Palpations: Abdomen is soft.  Neurological:     General: No focal deficit present.     Mental Status: He is alert and oriented to person, place, and time.  Psychiatric:        Mood and Affect: Mood normal.        Thought Content: Thought content normal.        Judgment: Judgment normal.       Imaging: DG Chest 2 View  Result Date: 07/01/2019 CLINICAL DATA:  Altered mental status with fever EXAM: CHEST - 2 VIEW COMPARISON:  06/28/2019, CT chest 09/28/2011 FINDINGS: Mild cardiomegaly. No focal consolidation or pleural effusion. Aortic atherosclerosis. No pneumothorax IMPRESSION: 1. No acute focal airspace disease 2. Mild cardiomegaly Electronically Signed   By: Donavan Foil M.D.   On: 07/01/2019 21:29   CT Head Wo Contrast  Result Date: 06/28/2019 CLINICAL DATA:  Confusion. Increased confusion over the past 2 days. EXAM: CT HEAD WITHOUT CONTRAST TECHNIQUE: Contiguous axial images were obtained from the base of the skull through the vertex without intravenous contrast. COMPARISON:  Head CT and brain MRI May 2013 FINDINGS: Brain: No intracranial hemorrhage, mass effect, or midline shift. Age related atrophy. No hydrocephalus. The basilar cisterns are patent. Moderate to advanced chronic small vessel ischemia. No evidence of territorial infarct or acute ischemia. No  extra-axial or intracranial fluid collection. Vascular: Atherosclerosis of skullbase vasculature without hyperdense vessel or abnormal calcification. Skull: No fracture or focal lesion. Sinuses/Orbits: Complete opacification of left side of sphenoid sinus with high-density material. Opacification of posterior left ethmoid air cells. There is some surrounding bony sclerosis. Mild mucosal thickening of right side of sphenoid sinus. Mastoid air cells are clear. Bilateral cataract resection. Other: None. IMPRESSION: 1. No acute intracranial abnormality. 2. Age related atrophy. Moderate to advanced chronic small vessel ischemia. 3. Opacification of left sphenoid sinus and posterior ethmoid air cells with high density material. There is adjacent bony sclerosis suggesting this is chronic sinusitis, however was not seen on 2013 exam.  Electronically Signed   By: Keith Rake M.D.   On: 06/28/2019 17:47   CT SOFT TISSUE NECK W CONTRAST  Result Date: 07/03/2019 CLINICAL DATA:  Provided INDICATION: Retropharyngeal/prevertebral edema, abscess, sepsis. EXAM: CT NECK WITH CONTRAST TECHNIQUE: Multidetector CT imaging of the neck was performed using the standard protocol following the bolus administration of intravenous contrast. CONTRAST:  78mL OMNIPAQUE IOHEXOL 300 MG/ML  SOLN COMPARISON:  Cervical spine MRI 07/02/2019, maxillofacial CT 07/02/2019. FINDINGS: Pharynx and larynx: Streak artifact from dental restoration limits evaluation of the oral cavity. There is no appreciable discrete mass or swelling within the oral cavity or nasopharynx. Again demonstrated is abnormal prevertebral/retropharyngeal fluid or edema extending from the C1-C2 level inferiorly to the C7 level. Centered at the C5-C6 level there is prominent inflammatory stranding and phlegmon extending into the right neck soft tissues measuring 4.0 x 4.6 x 7.5 cm (for instance as seen on series 10, image 88 and series 8, image 44). Within this region at the C5  level, there is a somewhat circumscribed region of hypoattenuation along the right posterior aspect of the thyroid cartilage measuring 3.0 x 2.0 cm in transaxial dimensions (series 10, image 74). Although no well-defined peripheral enhancement is demonstrated, findings are suspicious for early abscess formation. There is associated leftward deviation of the laryngeal airway and subglottic trachea without significant airway narrowing. There is rightward displacement of the right internal jugular vein and common carotid artery. There is heterogeneous enhancement of the right thyroid lobe suggestive of thyroiditis. Salivary glands: Unremarkable. Thyroid: Heterogeneous appearance of the right thyroid lobe with extensive surrounding phlegmonous change/abscess as described below. There are few coarse calcifications within the left thyroid lobe. Lymph nodes: Right cervical chain lymphadenopathy is likely reactive. Most notably a right level III lymph node measures 13 mm in short axis (series 8, image 30). Vascular: The major vascular structures of the neck appear patent. The right internal jugular vein is significantly narrowed at the level of the mid neck due to phlegmonous change/abscess. Limited intracranial: No abnormality identified. Visualized orbits: Excluded from the field of view. Mastoids and visualized paranasal sinuses: The paranasal sinuses are incompletely imaged. Redemonstrated complete opacification of the left sphenoid sinus with associated reactive osteitis. No significant mastoid effusion. Skeleton: No acute bony abnormality. Redemonstrated cervical spondylosis with multilevel disc space narrowing, posterior disc osteophytes as well as uncovertebral hypertrophy. Upper chest: No consolidation within the imaged lung apices. IMPRESSION: Redemonstrated abnormal prevertebral/retropharyngeal fluid or edema extending from the C1-C2 level inferiorly to the C7 level. Extensive inflammatory stranding and  phlegmonous change extending into the right neck soft tissues centered at the C5 level. Findings suspicious for an associated 3.0 x 2.0 abscess along the right posterior aspect of the thyroid cartilage at the C5 level. Heterogeneous enhancement of the right thyroid lobe suspicious for secondary thyroiditis. Findings likely reflect sequela of an infectious process. Close clinical follow-up is recommended with repeat imaging as warranted to exclude alternative etiologies (i.e. Neoplasm). Right cervical lymphadenopathy, likely reactive. Chronic left sphenoid sinusitis. Electronically Signed   By: Kellie Simmering DO   On: 07/03/2019 14:35   MR CERVICAL SPINE W WO CONTRAST  Result Date: 07/03/2019 CLINICAL DATA:  Initial evaluation for acute sepsis, unknown source, concern for discitis. EXAM: MRI CERVICAL SPINE WITHOUT AND WITH CONTRAST TECHNIQUE: Multiplanar and multiecho pulse sequences of the cervical spine, to include the craniocervical junction and cervicothoracic junction, were obtained without and with intravenous contrast. CONTRAST:  61mL GADAVIST GADOBUTROL 1 MMOL/ML IV SOLN COMPARISON:  Prior maxillofacial  CT from earlier the same day. FINDINGS: Alignment: Mild straightening of the normal cervical lordosis. No listhesis. Vertebrae: Vertebral body height maintained without evidence for acute or chronic fracture. Bone marrow signal intensity somewhat diffusely decreased on T1 weighted imaging, nonspecific, but most commonly related to anemia, smoking, or obesity. No discrete or worrisome osseous lesions. Mild reactive endplate changes present about the C4-5 through C6-7 interspaces, degenerative in nature. No findings to suggest osteomyelitis discitis or septic arthritis. No abnormal marrow edema or enhancement. Cord: Signal intensity within the cervical spinal cord is normal. No abnormal enhancement. No epidural abscess or other collection. Posterior Fossa, vertebral arteries, paraspinal tissues: Age-related  cerebral atrophy noted within the visualized brain. Craniocervical junction normal. There is abnormal prevertebral/retropharyngeal edema extending from the nasopharynx through the upper thoracic spine. Abnormal edema with phlegmonous change seen involving the right parapharyngeal space, most pronounced at the level of the thyroid cartilage at approximately level III/IV (series 9, image 29). Finding concerning for acute infection given provided history. There is suggestion of a superimposed abscess at this level measuring up to approximately 2 cm (series 12, image 24), incompletely visualized. No frank loculated retropharyngeal abscess or collection. Prominent right level II/III lymph node measures up to 12 mm, likely reactive (series 8, image 20). Normal flow void seen within the vertebral arteries bilaterally. Disc levels: C2-C3: Small central disc protrusion minimally indents the ventral thecal sac. No canal or foraminal stenosis. C3-C4: Moderate-sized central disc protrusion indents the ventral thecal sac, contacting and mildly flattening the ventral spinal cord. Resultant moderate spinal stenosis. Superimposed bilateral uncovertebral and facet hypertrophy. Resultant mild left greater than right C4 foraminal stenosis. C4-C5: Chronic intervertebral disc space narrowing with mild diffuse disc bulge. Right greater than left uncovertebral hypertrophy. Broad posterior disc osteophyte flattens and partially faces the ventral thecal sac, greater on the right. Resultant mild-to-moderate spinal stenosis with mild flattening of the right hemi cord. Severe right with moderate left C5 foraminal stenosis. C5-C6: Diffuse disc bulge with bilateral uncovertebral hypertrophy. Broad posterior component effaces the ventral thecal sac and resultant moderate spinal stenosis. Severe right with moderate left C6 foraminal narrowing. C6-C7: Diffuse disc bulge with bilateral uncovertebral hypertrophy. Mild ligament flavum thickening.  Resultant mild spinal stenosis without cord impingement. Moderate left worse than right C7 foraminal narrowing. C7-T1:  Negative interspace.  Mild facet hypertrophy.  No stenosis. Visualized upper thoracic spine demonstrates no significant finding. IMPRESSION: 1. Abnormal retropharyngeal/prevertebral edema with associated phlegmonous change within the lower right neck as above, concerning for acute infection. Probable superimposed abscess measuring up to approximately 2 cm, incompletely visualized on this exam. Further evaluation with dedicated soft tissue neck CT with contrast recommended for further evaluation. 2. No evidence for osteomyelitis discitis within the cervical spine. 3. Multilevel cervical spondylosis with resultant mild to moderate diffuse spinal stenosis at C3-4 through C6-7. Associated moderate to severe bilateral C5 through C7 foraminal narrowing as above. Electronically Signed   By: Jeannine Boga M.D.   On: 07/03/2019 03:29   CT Abdomen Pelvis W Contrast  Result Date: 07/01/2019 CLINICAL DATA:  Cirrhosis EXAM: CT ABDOMEN AND PELVIS WITH CONTRAST TECHNIQUE: Multidetector CT imaging of the abdomen and pelvis was performed using the standard protocol following bolus administration of intravenous contrast. CONTRAST:  132mL OMNIPAQUE IOHEXOL 300 MG/ML  SOLN COMPARISON:  Ultrasound 06/28/2019, CT 06/27/2014 FINDINGS: Lower chest: Lung bases demonstrate streaky atelectasis or scarring within the bases. No acute consolidation or pleural effusion. Borderline to mild cardiomegaly. Hepatobiliary: Cirrhotic morphology of the liver. Subcentimeter hypodensities  too small to further characterize. No calcified gallstone or biliary dilatation Pancreas: Unremarkable. No pancreatic ductal dilatation or surrounding inflammatory changes. Spleen: Normal in size without focal abnormality. Adrenals/Urinary Tract: Adrenal glands are normal. Cortical scarring in the mid left kidney. Negative for hydronephrosis.  The bladder is normal Stomach/Bowel: Stomach is within normal limits. Appendix appears normal. No evidence of bowel wall thickening, distention, or inflammatory changes. Diverticular disease of the left colon without acute inflammatory change. Vascular/Lymphatic: Moderate aortic atherosclerosis without aneurysm. Subcentimeter retroperitoneal nodes. Cavernous transformation of the portal vein. Numerous varices and collateral vessels within the abdomen. Reproductive: Slightly enlarged prostate Other: Negative for free air or free fluid. Musculoskeletal: Chronic compression deformity of T12. IMPRESSION: 1. Liver cirrhosis with evidence for portal hypertension. 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 2. Left colon diverticular disease without acute inflammatory process. Electronically Signed   By: Donavan Foil M.D.   On: 07/01/2019 23:26   DG Chest Port 1 View  Result Date: 06/28/2019 CLINICAL DATA:  Increased confusion, cirrhosis EXAM: PORTABLE CHEST 1 VIEW COMPARISON:  None. FINDINGS: No consolidation or edema. No pleural effusion or pneumothorax. Cardiomediastinal contours are within normal limits for portable technique. There is calcified plaque along the aortic arch. IMPRESSION: No acute process in the chest. Electronically Signed   By: Macy Mis M.D.   On: 06/28/2019 15:49   CT MAXILLOFACIAL WO CONTRAST  Result Date: 07/02/2019 CLINICAL DATA:  85 year old male. Sepsis. Sinus congestion. EXAM: CT MAXILLOFACIAL WITHOUT CONTRAST TECHNIQUE: Multidetector CT imaging of the maxillofacial structures was performed. Multiplanar CT image reconstructions were also generated. COMPARISON:  Head CT 06/28/2019, brain MRI 08/11/2011. FINDINGS: Osseous: The visible cervical vertebrae appear intact (through C5) but there is abnormal prevertebral soft tissue swelling and retropharyngeal or prevertebral fluid/edema (series 3, image 16). This seems to  progress caudally in the neck, and becomes eccentric to the right at the level of the thyroid cartilage on series 3, image 87. Mandible intact. No acute dental abnormality identified. Maxilla, zygoma, nasal bones, central skull base and visible calvarium appear intact. Orbits: Intact orbital walls. Postoperative changes to both globes, otherwise negative noncontrast orbits soft tissues. Sinuses: Posterior left ethmoid and left sphenoid sinus opacification with periosteal thickening. But no complicating features are identified, and the remaining paranasal sinuses are well pneumatized. Tympanic cavities and mastoids are clear. Soft tissues: Abnormal prevertebral/retropharyngeal fluid or edema tracking inferiorly in the neck as stated above. There is also a partially retropharyngeal course of the right ICA. There are reactive appearing right level 3 lymph nodes on series 3, image 85. But elsewhere the noncontrast deep soft tissue spaces of the face and neck are within normal limits. Limited intracranial: Stable, No acute intracranial abnormality. Calcified atherosclerosis at the skull base. IMPRESSION: 1. The dominant abnormality is prevertebral/retropharyngeal fluid or edema tracking which seems to increase towards the lower neck. Reactive appearing right level 3 lymph nodes. Visible cervical vertebrae appear intact, but in the setting of sepsis lower Cervical Discitis-Osteomyelitis is a leading consideration. Retropharyngeal infection is in the differential. Cervical Spine MRI without and with contrast would be most valuable. Failing that Neck CT with IV contrast would be recommended. 2. Posterior left ethmoid and left sphenoid sinusitis, but has a chronic appearance with no complicating features. Electronically Signed   By: Genevie Ann M.D.   On: 07/02/2019 14:21   US Abdomen Limited RUQ  Result Date: 06/28/2019 CLINICAL DATA:  Altered mental status.  LFT elevation. EXAM: ULTRASOUND  ABDOMEN LIMITED RIGHT UPPER  QUADRANT COMPARISON:  None. FINDINGS: Gallbladder: No gallstones or wall thickening visualized. No sonographic Murphy sign noted by sonographer. Common bile duct: Diameter: The common bile duct was poorly evaluated. Liver: Diffuse increased echogenicity with slightly heterogeneous liver. Appearance typically secondary to fatty infiltration. Fibrosis secondary consideration. No secondary findings of cirrhosis noted. No focal hepatic lesion or intrahepatic biliary duct dilatation. The liver surface appears nodular. Portal vein is patent on color Doppler imaging with normal direction of blood flow towards the liver. Other: None. IMPRESSION: 1. No evidence for cholelithiasis. 2. The common bile duct was not visualized. 3. Cirrhosis. Electronically Signed   By: Constance Holster M.D.   On: 06/28/2019 21:00    Labs:  CBC: Recent Labs    07/01/19 2057 07/02/19 0640 07/03/19 0440 07/04/19 0248  WBC 9.9 11.2* 9.0 8.3  HGB 14.8 13.0 13.0 12.9*  HCT 43.6 38.5* 38.8* 38.4*  PLT 94* 74* 73* 90*    COAGS: Recent Labs    06/28/19 1531 06/29/19 0253 07/01/19 2057  INR 1.5* 1.4* 1.5*  APTT 39*  --   --     BMP: Recent Labs    06/30/19 0805 07/01/19 2057 07/03/19 0440 07/04/19 0248  NA 140 137 138 138  K 3.6 4.0 3.9 3.6  CL 105 106 109 109  CO2 23 21* 22 20*  GLUCOSE 102* 123* 92 98  BUN 23 22 21 17   CALCIUM 8.7* 8.9 8.1* 7.8*  CREATININE 1.12 1.02 0.98 0.82  GFRNONAA >60 >60 >60 >60  GFRAA >60 >60 >60 >60    LIVER FUNCTION TESTS: Recent Labs    06/30/19 0805 07/01/19 2057 07/03/19 0440 07/04/19 0248  BILITOT 3.2* 2.6* 3.7* 2.3*  AST 64* 88* 49* 48*  ALT 28 32 23 23  ALKPHOS 63 66 53 59  PROT 6.3* 6.2* 5.7* 5.5*  ALBUMIN 2.7* 2.6* 2.2* 2.1*    TUMOR MARKERS: No results for input(s): AFPTM, CEA, CA199, CHROMGRNA in the last 8760 hours.  Assessment and Plan: (R)neck/thyroid inflammatory mass vs abscess For US guided aspiration. Labs reviewed. Risks and benefits of  procedure was discussed with the patient and/or patient's family including, but not limited to bleeding, infection, damage to adjacent structures or low yield requiring additional tests.  All of the questions were answered and there is agreement to proceed.  Consent signed and in chart.    Thank you for this interesting consult.  I greatly enjoyed meeting Glen Finley and look forward to participating in their care.  A copy of this report was sent to the requesting provider on this date.  Electronically Signed: Ascencion Dike, PA-C 07/04/2019, 10:03 AM   I spent a total of 20 minutes in face to face in clinical consultation, greater than 50% of which was counseling/coordinating care for right neck mass/abscess aspiration

## 2019-07-04 NOTE — Progress Notes (Signed)
Pt back from IR. VSS. Call bell in reach. Will continue to monitor.  Arletta Bale, RN

## 2019-07-04 NOTE — Procedures (Signed)
Interventional Radiology Procedure:   Indications: Right neck mass   Procedure: US guided FNA of right neck mass  Findings: Right neck appears mostly solid.  Total 6 FNAs obtained.  One specimen obtained for culture.   Complications: None     EBL: less than 10 ml  Plan: Return to inpatient floor.    Winferd Wease R. Anselm Pancoast, MD  Pager: [phone]

## 2019-07-04 NOTE — Progress Notes (Signed)
Pharmacy Antibiotic Note  Glen Finley is a 85 y.o. male admitted on 07/01/2019 with sepsis and ultimately found to have retropharyngeal abscess. Fevers continued and ID consulted, ABX adjusted to vancomycin, ceftriaxone, and metronidazole. Cr remains stable, cultures negative thus far, IR consulted for aspiration.  Plan:  -Will increase vancomycin to 1750mg  IV q24h with improved renal function (est AUC 439) -Ceftriaxone 2g IV q24h + metronidazole 500mg  IV q8h -Follow renal function, fever curve, cultures, IR plans   Weight: 90.6 kg (199 lb 11.8 oz)  Temp (24hrs), Avg:98.6 F (37 C), Min:97.8 F (36.6 C), Max:99.2 F (37.3 C)  Recent Labs  Lab 06/28/19 1531 06/28/19 1531 06/29/19 0253 06/30/19 0805 07/01/19 2057 07/02/19 0009 07/02/19 0640 07/03/19 0440 07/04/19 0248  WBC 7.5  --   --   --  9.9  --  11.2* 9.0 8.3  CREATININE 1.08   < > 1.01 1.12 1.02  --   --  0.98 0.82  LATICACIDVEN  --   --   --   --  2.4* 1.9  --   --   --    < > = values in this interval not displayed.    Estimated Creatinine Clearance: 76 mL/min (by C-G formula based on SCr of 0.82 mg/dL).    Allergies  Allergen Reactions  . Amlodipine Swelling and Other (See Comments)    CAUSED SEVERE SWELLING OF THE LEGS THAT REQUIRED A TRIP TO THE MD'S OFFICE  . Cherry Other (See Comments)    Migraines   . Onion Other (See Comments)    Can eat cooked onions ONLY (or, patient develops migraines)  . Codeine Rash    Antimicrobials this admission: Vancomycin 4/10 >>  Cefepime 4/10 >> 4/12  Metronidazole 4/10 >> Ceftriaxone 4/12 >>   Microbiology results: 4/10 BCx: [email or web address]   Arrie Senate, PharmD, BCPS Clinical Pharmacist [phone] Please check AMION for all [place] numbers 07/04/2019

## 2019-07-05 DIAGNOSIS — K7200 Acute and subacute hepatic failure without coma: Secondary | ICD-10-CM

## 2019-07-05 DIAGNOSIS — K7469 Other cirrhosis of liver: Secondary | ICD-10-CM

## 2019-07-05 DIAGNOSIS — R262 Difficulty in walking, not elsewhere classified: Secondary | ICD-10-CM

## 2019-07-05 DIAGNOSIS — R21 Rash and other nonspecific skin eruption: Secondary | ICD-10-CM

## 2019-07-05 DIAGNOSIS — R5381 Other malaise: Secondary | ICD-10-CM

## 2019-07-05 LAB — COMPREHENSIVE METABOLIC PANEL
ALT: 22 U/L (ref 0–44)
AST: 45 U/L — ABNORMAL HIGH (ref 15–41)
Albumin: 2.2 g/dL — ABNORMAL LOW (ref 3.5–5.0)
Alkaline Phosphatase: 54 U/L (ref 38–126)
Anion gap: 8 (ref 5–15)
BUN: 17 mg/dL (ref 8–23)
CO2: 22 mmol/L (ref 22–32)
Calcium: 7.9 mg/dL — ABNORMAL LOW (ref 8.9–10.3)
Chloride: 108 mmol/L (ref 98–111)
Creatinine, Ser: 0.83 mg/dL (ref 0.61–1.24)
GFR calc Af Amer: >60 mL/min (ref >60)
GFR calc non Af Amer: >60 mL/min (ref >60)
Glucose, Bld: 89 mg/dL (ref 70–99)
Potassium: 4.2 mmol/L (ref 3.5–5.1)
Sodium: 138 mmol/L (ref 135–145)
Total Bilirubin: 2.2 mg/dL — ABNORMAL HIGH (ref 0.3–1.2)
Total Protein: 5.4 g/dL — ABNORMAL LOW (ref 6.5–8.1)

## 2019-07-05 LAB — CBC
HCT: 39.5 % (ref 39.0–52.0)
Hemoglobin: 13.4 g/dL (ref 13.0–17.0)
MCH: 33.8 pg (ref 26.0–34.0)
MCHC: 33.9 g/dL (ref 30.0–36.0)
MCV: 99.7 fL (ref 80.0–100.0)
Platelets: 104 10*3/uL — ABNORMAL LOW (ref 150–400)
RBC: 3.96 MIL/uL — ABNORMAL LOW (ref 4.22–5.81)
RDW: 15.2 % (ref 11.5–15.5)
WBC: 7.9 10*3/uL (ref 4.0–10.5)
nRBC: 0 % (ref 0.0–0.2)

## 2019-07-05 LAB — CYTOLOGY - NON PAP

## 2019-07-05 LAB — MRSA PCR SCREENING: MRSA by PCR: NEGATIVE

## 2019-07-05 MED ORDER — SODIUM CHLORIDE 0.9% FLUSH: 10.0000 mL | INTRAVENOUS | Status: DC | PRN

## 2019-07-05 MED ORDER — SODIUM CHLORIDE 0.9% FLUSH
10.0000 mL | Freq: Two times a day (BID) | INTRAVENOUS | Status: DC
  Administered 2019-07-05 – 2019-07-13 (×8): 10 mL
  Administered 2019-07-13: 23:00:00 20 mL
  Administered 2019-07-14 – 2019-07-17 (×7): 10 mL

## 2019-07-05 MED ORDER — SODIUM CHLORIDE 0.9 % IV SOLN
3.0000 g | Freq: Four times a day (QID) | INTRAVENOUS | Status: DC
  Administered 2019-07-05 – 2019-07-12 (×29): 3 g via INTRAVENOUS
  Filled 2019-07-05: qty 3
  Filled 2019-07-05: qty 8
  Filled 2019-07-05 (×12): qty 3
  Filled 2019-07-05: qty 8
  Filled 2019-07-05 (×8): qty 3
  Filled 2019-07-05 (×2): qty 8
  Filled 2019-07-05 (×2): qty 3
  Filled 2019-07-05: qty 8
  Filled 2019-07-05: qty 3
  Filled 2019-07-05: qty 8
  Filled 2019-07-05 (×3): qty 3

## 2019-07-05 NOTE — Progress Notes (Addendum)
Patient has red rash on lower legs and arms and on abdo. And chest  Not itching

## 2019-07-05 NOTE — Progress Notes (Signed)
[facility]  Date of Admission:  07/01/2019     Total days of antibiotics 5         ASSESSMENT:  Glen Finley completed aspiration of his retropharyngeal abscess with IR specimen with gram positive cocci on gram stain and no growth <24 hours on cultures. Clinically appears improved today and more alert. Has developed likely drug related rash. No current anaphylactic symptoms. Check MRSA PCR swab today. Will discontinued vancomycin and change ceftriaxone to Unasyn. Continue to monitor rash for progression.    PLAN:  1. Change ceftriaxone to unasyn and discontinue vancomycin.  2. Check MRSA PCR nasal swab. 3. Consider penicillin allergy testing. 4. Monitor rash progression.    Principal Problem:   Retropharyngeal abscess Active Problems:   Hypertension   Myasthenia gravis without exacerbation ([place])   Acute hepatic encephalopathy   Sepsis ([place])   Liver cirrhosis ([place])   . amitriptyline  25 mg Oral QHS  . atenolol  25 mg Oral q AM  . escitalopram  10 mg Oral q AM  . hydrALAZINE  10 mg Oral Q8H  . lactulose  20 g Oral TID  . lisinopril  20 mg Oral BID  . sodium chloride flush  10-40 mL Intracatheter Q12H    SUBJECTIVE:  Afebrile overnight with no acute events. Feeling better today "about 85%" of baseline. Has a new onset rash located on his legs, arms and abdomen. Denies itching, shortness of breath, or angioedema.   Allergies  Allergen Reactions  . Amlodipine Swelling and Other (See Comments)    CAUSED SEVERE SWELLING OF THE LEGS THAT REQUIRED A TRIP TO THE MD'S OFFICE  . Cherry Other (See Comments)    Migraines   . Onion Other (See Comments)    Can eat cooked onions ONLY (or, patient develops migraines)  . Codeine Rash     Review of Systems: Review of Systems  Constitutional: Negative for chills, fever and weight loss.  Respiratory: Negative for cough, shortness of breath and wheezing.   Cardiovascular: Negative for chest pain and leg  swelling.  Gastrointestinal: Negative for abdominal pain, constipation, diarrhea, nausea and vomiting.  Skin: Positive for rash.      OBJECTIVE: Vitals:   07/05/19 0439 07/05/19 0444 07/05/19 0554 07/05/19 0837  BP:  (!) 182/75 (!) 145/68 (!) 128/53  Pulse:  80 80 68  Resp:  19 17 18   Temp:  98.6 F (37 C)  98.5 F (36.9 C)  TempSrc:  Oral  Oral  SpO2:  96% 95% 95%  Weight: 91.8 kg      Body mass index is 29.89 kg/m.  Physical Exam Constitutional:      General: Glen Finley is not in acute distress.    Appearance: Glen Finley is well-developed.     Comments: Lying in bed with head of bed elevated; pleasant.   Cardiovascular:     Rate and Rhythm: Normal rate and regular rhythm.     Heart sounds: Normal heart sounds.  Pulmonary:     Effort: Pulmonary effort is normal.     Breath sounds: Normal breath sounds.  Skin:    General: Skin is warm and dry.     Comments: Maculopapular rash located on bilateral legs, arms and abdomen.   Neurological:     Mental Status: Glen Finley is alert and oriented to person, place, and time.  Psychiatric:        Behavior: Behavior normal.        Thought Content: Thought content  normal.        Judgment: Judgment normal.     Lab Results Lab Results  Component Value Date   WBC 7.9 07/05/2019   HGB 13.4 07/05/2019   HCT 39.5 07/05/2019   MCV 99.7 07/05/2019   PLT 104 (L) 07/05/2019    Lab Results  Component Value Date   CREATININE 0.83 07/05/2019   BUN 17 07/05/2019   NA 138 07/05/2019   K 4.2 07/05/2019   CL 108 07/05/2019   CO2 22 07/05/2019    Lab Results  Component Value Date   ALT 22 07/05/2019   AST 45 (H) 07/05/2019   ALKPHOS 54 07/05/2019   BILITOT 2.2 (H) 07/05/2019     Microbiology: Recent Results (from the past 240 hour(s))  SARS CORONAVIRUS 2 (TAT 6-24 HRS) Nasopharyngeal Nasopharyngeal Swab     Status: None   Collection Time: 06/28/19  7:07 PM   Specimen: Nasopharyngeal Swab  Result Value Ref Range Status   SARS Coronavirus 2  NEGATIVE NEGATIVE Final    Comment: (NOTE) SARS-CoV-2 target nucleic acids are NOT DETECTED. The SARS-CoV-2 RNA is generally detectable in upper and lower respiratory specimens during the acute phase of infection. Negative results do not preclude SARS-CoV-2 infection, do not rule out co-infections with other pathogens, and should not be used as the sole basis for treatment or other patient management decisions. Negative results must be combined with clinical observations, patient history, and epidemiological information. The expected result is Negative. Fact Sheet for Patients: SugarRoll.be Fact Sheet for Healthcare Providers: https://www.woods-mathews.com/ This test is not yet approved or cleared by the [place] FDA and  has been authorized for detection and/or diagnosis of SARS-CoV-2 by FDA under an Emergency Use Authorization (EUA). This EUA will remain  in effect (meaning this test can be used) for the duration of the COVID-19 declaration under Section 56 4(b)(1) of the Act, 21 U.S.C. section 360bbb-3(b)(1), unless the authorization is terminated or revoked sooner. Performed at [facility], [place] [address], [place], [place] [address]   Culture, blood (Routine x 2)     Status: None (Preliminary result)   Collection Time: 07/01/19  8:48 PM   Specimen: BLOOD  Result Value Ref Range Status   Specimen Description BLOOD RIGHT FOREARM  Final   Special Requests   Final    BOTTLES DRAWN AEROBIC AND ANAEROBIC Blood Culture adequate volume   Culture   Final    NO GROWTH 3 DAYS Performed at [facility], [place] [address] [place], [place] [address]    Report Status PENDING  Incomplete  Culture, blood (Routine x 2)     Status: None (Preliminary result)   Collection Time: 07/01/19  8:58 PM   Specimen: BLOOD  Result Value Ref Range Status   Specimen Description BLOOD LEFT FOREARM  Final   Special Requests   Final    BOTTLES DRAWN  AEROBIC AND ANAEROBIC Blood Culture adequate volume   Culture   Final    NO GROWTH 3 DAYS Performed at [facility], [place] [address] [place], [place] [address]    Report Status PENDING  Incomplete  SARS CORONAVIRUS 2 (TAT 6-24 HRS) Nasopharyngeal Nasopharyngeal Swab     Status: None   Collection Time: 07/01/19  9:51 PM   Specimen: Nasopharyngeal Swab  Result Value Ref Range Status   SARS Coronavirus 2 NEGATIVE NEGATIVE Final    Comment: (NOTE) SARS-CoV-2 target nucleic acids are NOT DETECTED. The SARS-CoV-2 RNA is generally detectable  in upper and lower respiratory specimens during the acute phase of infection. Negative results do not preclude SARS-CoV-2 infection, do not rule out co-infections with other pathogens, and should not be used as the sole basis for treatment or other patient management decisions. Negative results must be combined with clinical observations, patient history, and epidemiological information. The expected result is Negative. Fact Sheet for Patients: SugarRoll.be Fact Sheet for Healthcare Providers: https://www.woods-mathews.com/ This test is not yet approved or cleared by the [place] FDA and  has been authorized for detection and/or diagnosis of SARS-CoV-2 by FDA under an Emergency Use Authorization (EUA). This EUA will remain  in effect (meaning this test can be used) for the duration of the COVID-19 declaration under Section 56 4(b)(1) of the Act, 21 U.S.C. section 360bbb-3(b)(1), unless the authorization is terminated or revoked sooner. Performed at [facility], [place] [address] [place], [place] [address]   Body fluid culture     Status: None (Preliminary result)   Collection Time: 07/04/19  2:20 PM   Specimen: Neck; Body Fluid  Result Value Ref Range Status   Specimen Description NECK  Final   Special Requests NONE  Final   Gram Stain   Final    RARE WBC PRESENT,BOTH PMN AND  MONONUCLEAR RARE GRAM POSITIVE COCCI    Culture   Final    NO GROWTH < 24 HOURS Performed at [facility], [place] [address] [place],  [address]    Report Status PENDING  Incomplete     Terri Piedra, NP [facility]  07/05/2019  11:26 AM

## 2019-07-05 NOTE — Progress Notes (Signed)
PROGRESS NOTE  Glen Finley RCV:[id number] DOB: 1934/06/18   PCP: Shon Baton, MD  Patient is from: Home  DOA: 07/01/2019 LOS: 4  Brief Narrative / Interim history: 85 year old male with history of myasthenia gravis/progressive gait disorder not on meds, PD vs Lewy body disease, bipolar disorder, migraine, diastolic CHF, HTN, HLD, cirrhosis, transaminitis and fatty liver disease brought to ED by EMS for fever and altered mental status and admitted for sepsis.  He was started on broad-spectrum antibiotics.  Later in the course, he was diagnosed with retropharyngeal and right neck abscess.    MRI cervical spine on 4/11 revealed abnormal retropharyngeal/prevertebral edema with associated phlegmonous change within the lower right neck concerning for acute infection with probable superimposed 2 cm abscess.  CT soft tissue neck with contrast on 4/12 redemonstrated abnormal prevertebral/retropharyngeal fluid or edema extending from the C1-C2 level inferiorly to the C7 level. Extensive inflammatory stranding and phlegmonous change extending into the right neck soft tissues centered at the C5 level. Findings suspicious for an associated 3.0 x 2.0 abscess along the right posterior aspect of the thyroid cartilage at the C5 level. Heterogeneous enhancement of the right thyroid lobe suspicious for secondary thyroiditis  ENT, ID and IR following.  Patient had US guided FNA of right neck mass by IR on 4/13.  Culture with rare GPC.  Blood cultures negative so far.  He has been on vancomycin and ceftriaxone.   Subjective: Seen and examined earlier this morning.  No major events overnight or this morning.  Both patient and patient's wife reports improvement in his symptoms and mental status.  Patient's wife stated that he is alert today.  His voice is still raspy.  Concern about the skin rash.  Patient denies pain, dysphagia, odynophagia, dyspnea or chest pain.  Reports "some stomach pain".  Denies nausea or  vomiting.  Tolerated soft diet today.  Objective: Vitals:   07/05/19 0439 07/05/19 0444 07/05/19 0554 07/05/19 0837  BP:  (!) 182/75 (!) 145/68 (!) 128/53  Pulse:  80 80 68  Resp:  19 17 18   Temp:  98.6 F (37 C)  98.5 F (36.9 C)  TempSrc:  Oral  Oral  SpO2:  96% 95% 95%  Weight: 91.8 kg       Intake/Output Summary (Last 24 hours) at 07/05/2019 1426 Last data filed at 07/05/2019 0604 Gross per 24 hour  Intake 670 ml  Output 250 ml  Net 420 ml   Filed Weights   07/04/19 0434 07/05/19 0439  Weight: 90.6 kg 91.8 kg    Examination:  GENERAL: No apparent distress. Nontoxic.  HEENT: MMM.  Vision and hearing grossly intact.  No trismus. NECK: Supple.  No apparent JVD.  FROM without pain.  No palpable LAD. RESP:  No IWOB. Good air movement bilaterally. CVS:  RRR. Heart sounds normal.  ABD/GI/GU: BS present. Soft. Non tender.  MSK/EXT:  Moves extremities. No apparent deformity. No edema.  SKIN: no apparent skin lesion or wound NEURO: Awake, alert and oriented appropriately.  No apparent focal neuro deficit. PSYCH: Calm. Normal affect.   Procedures:  4/13- US guided FNA of right neck  Microbiology summarized: 4/10-COVID-19 PCR negative. 4/10-blood cultures NGTD so far 4/13-right neck tissue culture with GPC  Assessment & Plan: Sepsis, secondary to retropharyngeal infection/right neck abscess-clinically improving. -Blood cultures and MRSA PCR negative.   -MRI cervical spine w/wo contrast and CT soft tissue of neck with contrast as above. -[place] FNA by IR on 4/13.  Tissue cultures with GPC's. -  ID and ENT following-D escalated from vancomycin and ceftriaxone to Unasyn by ID  Acute respiratory failure with hypoxia-likely due to the above.  Saturating in mid to upper 90s on 2 L by [place]. -Wean oxygen as able -Encourage incentive spirometry  Acute hepatic encephalopathy, fatty liver disease, cirrhosis: Ammonia 92 on admit.  Mental status improved.  Alert and oriented x3.  -Continue lactulose and titrate for 2-3 BM a day.  Chronic diastolic heart failure: Stable -Continue holding Lasix.  Myasthenia gravis, progressive gait disorder -Follow-up with neurology outpatient -PT/OT  Essential hypertension: Normotensive -Continue lisinopril, atenolol, hydralazine  Depression -Continue amitriptyline, lexapro   Thrombocytopenia: Likely due to cirrhosis.  Improving. -Continue monitoring                 DVT prophylaxis: SCD in the setting of thrombocytopenia Code Status: Full code Family Communication: Updated patient's wife at bedside.  Discharge barrier: Retropharyngeal/leg infection with abscess requiring IV antibiotics Patient is from: Home Final disposition: Likely home in the next 48 to 72 hours once cleared by consultants.  Consultants:  Infectious disease ENT IR   Sch Meds:  Scheduled Meds: . amitriptyline  25 mg Oral QHS  . atenolol  25 mg Oral q AM  . escitalopram  10 mg Oral q AM  . hydrALAZINE  10 mg Oral Q8H  . lactulose  20 g Oral TID  . lisinopril  20 mg Oral BID  . sodium chloride flush  10-40 mL Intracatheter Q12H   Continuous Infusions: . ampicillin-sulbactam (UNASYN) IV 3 g (07/05/19 1403)   PRN Meds:.hydrALAZINE, sodium chloride flush  Antimicrobials: Anti-infectives (From admission, onward)   Start     Dose/Rate Route Frequency Ordered Stop   07/05/19 1300  Ampicillin-Sulbactam (UNASYN) 3 g in sodium chloride 0.9 % 100 mL IVPB     3 g 200 mL/hr over 30 Minutes Intravenous Every 6 hours 07/05/19 1226     07/04/19 2300  vancomycin (VANCOREADY) IVPB 1750 mg/350 mL  Status:  Discontinued     1,750 mg 175 mL/hr over 120 Minutes Intravenous Every 24 hours 07/04/19 0754 07/05/19 1226   07/03/19 1600  cefTRIAXone (ROCEPHIN) 2 g in sodium chloride 0.9 % 100 mL IVPB  Status:  Discontinued     2 g 200 mL/hr over 30 Minutes Intravenous Every 24 hours 07/03/19 1437 07/05/19 1226   07/02/19 2300  vancomycin  (VANCOREADY) IVPB 1500 mg/300 mL  Status:  Discontinued     1,500 mg 150 mL/hr over 120 Minutes Intravenous Every 24 hours 07/01/19 2223 07/04/19 0754   07/02/19 0600  ceFEPIme (MAXIPIME) 2 g in sodium chloride 0.9 % 100 mL IVPB  Status:  Discontinued     2 g 200 mL/hr over 30 Minutes Intravenous Every 8 hours 07/01/19 2223 07/03/19 1437   07/02/19 0600  metroNIDAZOLE (FLAGYL) IVPB 500 mg  Status:  Discontinued     500 mg 100 mL/hr over 60 Minutes Intravenous Every 8 hours 07/01/19 2350 07/05/19 1226   07/01/19 2130  vancomycin (VANCOREADY) IVPB 1750 mg/350 mL     1,750 mg 175 mL/hr over 120 Minutes Intravenous  Once 07/01/19 2115 07/02/19 0235   07/01/19 2115  ceFEPIme (MAXIPIME) 2 g in sodium chloride 0.9 % 100 mL IVPB  Status:  Discontinued     2 g 200 mL/hr over 30 Minutes Intravenous Every 12 hours 07/01/19 2109 07/01/19 2223   07/01/19 2115  metroNIDAZOLE (FLAGYL) IVPB 500 mg     500 mg 100 mL/hr over 60 Minutes Intravenous  Once 07/01/19 2109 07/02/19 0025   07/01/19 2115  vancomycin (VANCOCIN) IVPB 1000 mg/200 mL premix  Status:  Discontinued     1,000 mg 200 mL/hr over 60 Minutes Intravenous  Once 07/01/19 2109 07/01/19 2115       I have personally reviewed the following labs and images: CBC: Recent Labs  Lab 06/28/19 1531 06/28/19 1531 07/01/19 2057 07/02/19 0640 07/03/19 0440 07/04/19 0248 07/05/19 0315  WBC 7.5   < > 9.9 11.2* 9.0 8.3 7.9  NEUTROABS 3.7  --  6.7  --   --   --   --   HGB 15.8   < > 14.8 13.0 13.0 12.9* 13.4  HCT 45.7   < > 43.6 38.5* 38.8* 38.4* 39.5  MCV 98.7   < > 98.9 98.7 99.2 99.0 99.7  PLT 105*   < > 94* 74* 73* 90* 104*   < > = values in this interval not displayed.   BMP &GFR Recent Labs  Lab 06/30/19 0805 07/01/19 2057 07/03/19 0440 07/04/19 0248 07/05/19 0315  NA 140 137 138 138 138  K 3.6 4.0 3.9 3.6 4.2  CL 105 106 109 109 108  CO2 23 21* 22 20* 22  GLUCOSE 102* 123* 92 98 89  BUN 23 22 21 17 17   CREATININE 1.12 1.02  0.98 0.82 0.83  CALCIUM 8.7* 8.9 8.1* 7.8* 7.9*   Estimated Creatinine Clearance: 75.4 mL/min (by C-G formula based on SCr of 0.83 mg/dL). Liver & Pancreas: Recent Labs  Lab 06/30/19 0805 07/01/19 2057 07/03/19 0440 07/04/19 0248 07/05/19 0315  AST 64* 88* 49* 48* 45*  ALT 28 32 23 23 22   ALKPHOS 63 66 53 59 54  BILITOT 3.2* 2.6* 3.7* 2.3* 2.2*  PROT 6.3* 6.2* 5.7* 5.5* 5.4*  ALBUMIN 2.7* 2.6* 2.2* 2.1* 2.2*   No results for input(s): LIPASE, AMYLASE in the last 168 hours. Recent Labs  Lab 06/28/19 1532 06/29/19 0253 07/01/19 2058 07/02/19 0640  AMMONIA 171* 45* 92* 40*   Diabetic: No results for input(s): HGBA1C in the last 72 hours. Recent Labs  Lab 06/28/19 1546  GLUCAP 117*   Cardiac Enzymes: No results for input(s): CKTOTAL, CKMB, CKMBINDEX, TROPONINI in the last 168 hours. No results for input(s): PROBNP in the last 8760 hours. Coagulation Profile: Recent Labs  Lab 06/28/19 1531 06/29/19 0253 07/01/19 2057  INR 1.5* 1.4* 1.5*   Thyroid Function Tests: No results for input(s): TSH, T4TOTAL, FREET4, T3FREE, THYROIDAB in the last 72 hours. Lipid Profile: No results for input(s): CHOL, HDL, LDLCALC, TRIG, CHOLHDL, LDLDIRECT in the last 72 hours. Anemia Panel: No results for input(s): VITAMINB12, FOLATE, FERRITIN, TIBC, IRON, RETICCTPCT in the last 72 hours. Urine analysis:    Component Value Date/Time   COLORURINE AMBER (A) 07/01/2019 2058   APPEARANCEUR CLEAR 07/01/2019 2058   LABSPEC 1.018 07/01/2019 2058   PHURINE 6.0 07/01/2019 2058   [place] NEGATIVE 07/01/2019 2058   HGBUR NEGATIVE 07/01/2019 2058   [place] NEGATIVE 07/01/2019 2058   [place] NEGATIVE 07/01/2019 2058   PROTEINUR NEGATIVE 07/01/2019 2058   UROBILINOGEN 1.0 08/10/2011 1826   NITRITE NEGATIVE 07/01/2019 2058   LEUKOCYTESUR NEGATIVE 07/01/2019 2058   Sepsis Labs: Invalid input(s): PROCALCITONIN, [place]  Microbiology: Recent Results (from the past 240 hour(s))   SARS CORONAVIRUS 2 (TAT 6-24 HRS) Nasopharyngeal Nasopharyngeal Swab     Status: None   Collection Time: 06/28/19  7:07 PM   Specimen: Nasopharyngeal Swab  Result Value Ref Range Status   SARS  Coronavirus 2 NEGATIVE NEGATIVE Final    Comment: (NOTE) SARS-CoV-2 target nucleic acids are NOT DETECTED. The SARS-CoV-2 RNA is generally detectable in upper and lower respiratory specimens during the acute phase of infection. Negative results do not preclude SARS-CoV-2 infection, do not rule out co-infections with other pathogens, and should not be used as the sole basis for treatment or other patient management decisions. Negative results must be combined with clinical observations, patient history, and epidemiological information. The expected result is Negative. Fact Sheet for Patients: SugarRoll.be Fact Sheet for Healthcare Providers: https://www.woods-mathews.com/ This test is not yet approved or cleared by the [place] FDA and  has been authorized for detection and/or diagnosis of SARS-CoV-2 by FDA under an Emergency Use Authorization (EUA). This EUA will remain  in effect (meaning this test can be used) for the duration of the COVID-19 declaration under Section 56 4(b)(1) of the Act, 21 U.S.C. section 360bbb-3(b)(1), unless the authorization is terminated or revoked sooner. Performed at [facility], [place] [address] [place], [place] [address]   Culture, blood (Routine x 2)     Status: None (Preliminary result)   Collection Time: 07/01/19  8:48 PM   Specimen: BLOOD  Result Value Ref Range Status   Specimen Description BLOOD RIGHT FOREARM  Final   Special Requests   Final    BOTTLES DRAWN AEROBIC AND ANAEROBIC Blood Culture adequate volume Performed at [facility], [place] [address], [place], [place] [address]    Culture NO GROWTH 4 DAYS  Final   Report Status PENDING  Incomplete  Culture, blood (Routine x 2)     Status: None  (Preliminary result)   Collection Time: 07/01/19  8:58 PM   Specimen: BLOOD  Result Value Ref Range Status   Specimen Description BLOOD LEFT FOREARM  Final   Special Requests   Final    BOTTLES DRAWN AEROBIC AND ANAEROBIC Blood Culture adequate volume Performed at [facility], [place] [address] [place], [place] [address]    Culture NO GROWTH 4 DAYS  Final   Report Status PENDING  Incomplete  SARS CORONAVIRUS 2 (TAT 6-24 HRS) Nasopharyngeal Nasopharyngeal Swab     Status: None   Collection Time: 07/01/19  9:51 PM   Specimen: Nasopharyngeal Swab  Result Value Ref Range Status   SARS Coronavirus 2 NEGATIVE NEGATIVE Final    Comment: (NOTE) SARS-CoV-2 target nucleic acids are NOT DETECTED. The SARS-CoV-2 RNA is generally detectable in upper and lower respiratory specimens during the acute phase of infection. Negative results do not preclude SARS-CoV-2 infection, do not rule out co-infections with other pathogens, and should not be used as the sole basis for treatment or other patient management decisions. Negative results must be combined with clinical observations, patient history, and epidemiological information. The expected result is Negative. Fact Sheet for Patients: SugarRoll.be Fact Sheet for Healthcare Providers: https://www.woods-mathews.com/ This test is not yet approved or cleared by the [place] FDA and  has been authorized for detection and/or diagnosis of SARS-CoV-2 by FDA under an Emergency Use Authorization (EUA). This EUA will remain  in effect (meaning this test can be used) for the duration of the COVID-19 declaration under Section 56 4(b)(1) of the Act, 21 U.S.C. section 360bbb-3(b)(1), unless the authorization is terminated or revoked sooner. Performed at [facility], [place] [address] [place], [place] [address]   Body fluid culture     Status: None (Preliminary result)   Collection Time: 07/04/19  2:20  PM  Specimen: Neck; Body Fluid  Result Value Ref Range Status   Specimen Description NECK  Final   Special Requests NONE  Final   Gram Stain   Final    RARE WBC PRESENT,BOTH PMN AND MONONUCLEAR RARE GRAM POSITIVE COCCI    Culture   Final    NO GROWTH < 24 HOURS Performed at [facility], [place] [address], [place], [place] [address]    Report Status PENDING  Incomplete  MRSA PCR Screening     Status: None   Collection Time: 07/05/19 11:18 AM   Specimen: Nasal Mucosa; Nasopharyngeal  Result Value Ref Range Status   MRSA by PCR NEGATIVE NEGATIVE Final    Comment:        The GeneXpert MRSA Assay (FDA approved for NASAL specimens only), is one component of a comprehensive MRSA colonization surveillance program. It is not intended to diagnose MRSA infection nor to guide or monitor treatment for MRSA infections. Performed at [facility], [place] [address] [place], [place] [address]     Radiology Studies: [place] FNA SOFT TISSUE  Result Date: 07/04/2019 INDICATION: 85 year old with a right neck mass. Request for ultrasound-guided aspiration. EXAM: ULTRASOUND-GUIDED FINE NEEDLE ASPIRATION OF RIGHT NECK MASS MEDICATIONS: None. ANESTHESIA/SEDATION: None FLUOROSCOPY TIME:  None COMPLICATIONS: None immediate. PROCEDURE: Informed written consent was obtained from the patient after a thorough discussion of the procedural risks, benefits and alternatives. All questions were addressed. A timeout was performed prior to the initiation of the procedure. Right side of the neck was evaluated with ultrasound. Right neck lesion was identified. The lesion appeared to be mostly solid, therefore, many of the specimens were collected for cytology. Four fine-needle aspirations were performed with 25 gauge needles. Two fine-needle aspirations were obtained with 18 gauge needles. Bandage placed over the puncture site. FINDINGS: Heterogeneous hypoechoic lesion that appears to be inseparable from the thyroid  tissue. This soft tissue causes lateral displacement of the right common carotid artery and right internal jugular vein. No discrete fluid collections were identified. No significant fluid could be aspirated from this right neck lesion/mass. One of the 18 gauge fine-needle aspirations was collected for culture. IMPRESSION: Heterogeneous solid right neck mass. This lesion appears to be indistinguishable from the right thyroid tissue. No significant fluid could be aspirated from this right neck lesion. Ultrasound-guided fine-needle aspirations were obtained from the lesion. Electronically Signed   By: Markus Daft M.D.   On: 07/04/2019 17:42     Taye T. [place]  If 7PM-7AM, please contact night-coverage [email or web address] Password [email or web address] 07/05/2019, 2:26 PM

## 2019-07-05 NOTE — Progress Notes (Signed)
Physical Therapy Treatment Patient Details Name: Glen Finley MRN: [id number] DOB: Aug 29, 1934 Today's Date: 07/05/2019    History of Present Illness Glen Finley Taffe is an 85 y.o. 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  He was recently admitted to the hospital and discharged 06/30/19 after being treated for acute hepatic encephalopathy.    PT Comments    Pt sleeping on arrival. Easily roused but appears groggy and sleepy throughout session. He required min assist bed mobility, min assist sit to stand and min assist sidestepping with RW 5' bedside. Pt retropulsive with stance requiring assist to stabilize RW and manage balance. He sat EOB min guard assist to perform LE exercises. Pt on 2L O2 with SpO2 > 90% throughout session. Pt returned to supine with lunch tray arriving at end of session. Pt continues to present with significant deficits in activity tolerance. Mild regression noted in mobility and assist level needed. Will need to consider SNF at d/c if mobility regression trend continues.    Follow Up Recommendations  Home health PT;Supervision/Assistance - 24 hour     Equipment Recommendations  None recommended by PT    Recommendations for Other Services       Precautions / Restrictions Precautions Precautions: Fall Restrictions Weight Bearing Restrictions: No    Mobility  Bed Mobility Overal bed mobility: Needs Assistance Bed Mobility: Supine to Sit;Sit to Supine     Supine to sit: Min assist;HOB elevated Sit to supine: Min assist   General bed mobility comments: +rail, increased time  Transfers Overall transfer level: Needs assistance Equipment used: Rolling walker (2 wheeled) Transfers: Sit to/from  Stand Sit to Stand: Min assist         General transfer comment: cues for hand placement, assist to power up and stabilize standing balance  Ambulation/Gait Ambulation/Gait assistance: Min assist Gait Distance (Feet): 5 Feet Assistive device: Rolling walker (2 wheeled)       General Gait Details: Sidestepping with RW bedside. Pt retropulsive requiring balance assist and assist to stabilize RW.   Stairs             Wheelchair Mobility    Modified Rankin (Stroke Patients Only)       Balance Overall balance assessment: Needs assistance Sitting-balance support: Feet supported;No upper extremity supported Sitting balance-Leahy Scale: Fair     Standing balance support: Bilateral upper extremity supported;During functional activity Standing balance-Leahy Scale: Poor Standing balance comment: Relies on UE support                            Cognition Arousal/Alertness: Awake/alert Behavior During Therapy: WFL for tasks assessed/performed Overall Cognitive Status: History of cognitive impairments - at baseline                                 General Comments: Wife present in room.      Exercises General Exercises - Lower Extremity Ankle Circles/Pumps: AROM;Both;20 reps;Seated Long Arc Quad: AROM;Right;Left;10 reps;Seated Hip ABduction/ADduction: AROM;Both;10 reps;Seated Hip Flexion/Marching: AROM;Right;Left;10 reps;Seated    General Comments General comments (skin integrity, edema, etc.): SpO2 > 90% during session on 2L.      Pertinent Vitals/Pain Pain Assessment: No/denies pain    Home  Living                      Prior Function            PT Goals (current goals can now be found in the care plan section) Acute Rehab PT Goals Patient Stated Goal: to go home Progress towards PT goals: Progressing toward goals    Frequency    Min 3X/week      PT Plan Current plan remains appropriate    Co-evaluation               AM-PAC PT "6 Clicks" Mobility   Outcome Measure  Help needed turning from your back to your side while in a flat bed without using bedrails?: A Little Help needed moving from lying on your back to sitting on the side of a flat bed without using bedrails?: A Little Help needed moving to and from a bed to a chair (including a wheelchair)?: A Little Help needed standing up from a chair using your arms (e.g., wheelchair or bedside chair)?: A Little Help needed to walk in hospital room?: A Lot Help needed climbing 3-5 steps with a railing? : A Lot 6 Click Score: 16    End of Session Equipment Utilized During Treatment: Gait belt;Oxygen Activity Tolerance: Patient limited by fatigue Patient left: in bed;with call bell/phone within reach;with family/visitor present;with bed alarm set Nurse Communication: Mobility status PT Visit Diagnosis: Unsteadiness on feet (R26.81);Muscle weakness (generalized) (M62.81)     Time: 4142-3953 PT Time Calculation (min) (ACUTE ONLY): 28 min  Charges:  $Gait Training: 8-22 mins $Therapeutic Exercise: 8-22 mins                     Lorrin Goodell, PT  Office # [phone] Pager [phone]    Lorriane Shire 07/05/2019, 12:17 PM

## 2019-07-06 DIAGNOSIS — R531 Weakness: Secondary | ICD-10-CM

## 2019-07-06 LAB — CULTURE, BLOOD (ROUTINE X 2)
Culture: NO GROWTH
Culture: NO GROWTH
Special Requests: ADEQUATE
Special Requests: ADEQUATE

## 2019-07-06 NOTE — Care Management Important Message (Signed)
Important Message  Patient Details  Name: Glen Finley MRN: [id number] Date of Birth: 1935-03-04   Medicare Important Message Given:  Yes     Shelda Altes 07/06/2019, 9:27 AM

## 2019-07-06 NOTE — Progress Notes (Signed)
Physical Therapy Treatment Patient Details Name: Glen Finley MRN: [id number] DOB: 28-Oct-1934 Today's Date: 07/06/2019    History of Present Illness Olvin Rohr Nanninga is an 85 y.o. 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  He was recently admitted to the hospital and discharged 06/30/19 after being treated for acute hepatic encephalopathy.    PT Comments    Pt making steady progress.    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 Verbal cues for hand placement.   Ambulation/Gait Ambulation/Gait assistance: Min assist;Mod assist Gait Distance (Feet): 20 Feet Assistive device: Rolling walker (2 wheeled) Gait Pattern/deviations: Step-to pattern;Decreased step length - right;Decreased step length - left;Shuffle;Trunk flexed Gait velocity: decr Gait velocity interpretation: <1.31 ft/sec, indicative of household ambulator General Gait Details: Assist for balance and support. Verbal  cues to incr step length and stay closer to walker. Pt with posterior lean against therapist hand requiring mod assist for balance. Lightened pressure on pt's back and improvement in posterior lean   Stairs             Wheelchair Mobility    Modified Rankin (Stroke Patients Only)       Balance Overall balance assessment: Needs assistance Sitting-balance support: Feet supported;Bilateral upper extremity supported Sitting balance-Leahy Scale: Poor Sitting balance - Comments: required UE support   Standing balance support: Bilateral upper extremity supported;During functional activity Standing balance-Leahy Scale: Poor Standing balance comment: walker and min assist for static standing                            Cognition Arousal/Alertness: Awake/alert Behavior During Therapy: WFL for tasks assessed/performed Overall Cognitive Status: History of cognitive impairments - at baseline                                        Exercises      General Comments General comments (skin integrity, edema, etc.): SpO2 >95% on RA      Pertinent Vitals/Pain Pain Assessment: No/denies pain    Home Living                      Prior Function            PT Goals (current goals can now be found in the care plan section) Acute Rehab PT Goals Patient Stated Goal: to go home Progress towards PT goals: Progressing toward goals  Frequency    Min 3X/week      PT Plan Current plan remains appropriate    Co-evaluation              AM-PAC PT "6 Clicks" Mobility   Outcome Measure  Help needed turning from your back to your side while in a flat bed without using bedrails?: A Little Help needed moving from lying on your back to sitting on the side of a flat bed without using bedrails?: A Little Help needed moving to and from a bed to a chair (including a wheelchair)?: A Little Help needed standing up from a chair using your arms (e.g.,  wheelchair or bedside chair)?: A Little Help needed to walk in hospital room?: A Lot Help needed climbing 3-5 steps with a railing? : A Lot 6 Click Score: 16    End of Session Equipment Utilized During Treatment: Gait belt Activity Tolerance: Patient tolerated treatment well Patient left: with call bell/phone within reach;with family/visitor present;in chair;with chair alarm set Nurse Communication: Mobility status PT Visit Diagnosis: Unsteadiness on feet (R26.81);Muscle weakness (generalized) (M62.81)     Time: 4497-5300 PT Time Calculation (min) (ACUTE ONLY): 27 min  Charges:  $Gait Training: 23-37 mins                     [place] Pager [phone] Office [place] 07/06/2019, 4:50 PM

## 2019-07-06 NOTE — Progress Notes (Signed)
[facility]  Date of Admission:  07/01/2019     Total days of antibiotics 6         ASSESSMENT:  Glen Finley rash appears to have improved with change of antibiotics. MRSA nasal swab negative indicating less likely MRSA infection and will continue with Unasyn. Cultures reincubated for better growth today. Agree with anticipated discharge in the next 24-48 hours. Will transition to Augmentin at discharge for 3 weeks and arrange follow up in ID clinic.   PLAN:  1. Continue Unasyn. 2. Monitor cultures for species identification if able.  3. Change to Augmentin at discharge for 3 weeks.   Principal Problem:   Retropharyngeal abscess Active Problems:   Hypertension   Myasthenia gravis without exacerbation ([place])   Acute hepatic encephalopathy   Sepsis ([place])   Liver cirrhosis ([place])   . amitriptyline  25 mg Oral QHS  . atenolol  25 mg Oral q AM  . escitalopram  10 mg Oral q AM  . hydrALAZINE  10 mg Oral Q8H  . lactulose  20 g Oral TID  . lisinopril  20 mg Oral BID  . sodium chloride flush  10-40 mL Intracatheter Q12H    SUBJECTIVE:  Afebrile overnight with no acute events. Continues to feel better but a little weak today. MRSA swab negative.   Allergies  Allergen Reactions  . Amlodipine Swelling and Other (See Comments)    CAUSED SEVERE SWELLING OF THE LEGS THAT REQUIRED A TRIP TO THE MD'S OFFICE  . Cherry Other (See Comments)    Migraines   . Onion Other (See Comments)    Can eat cooked onions ONLY (or, patient develops migraines)  . Codeine Rash     Review of Systems: Review of Systems  Constitutional: Negative for chills, fever and weight loss.  Respiratory: Negative for cough, shortness of breath and wheezing.   Cardiovascular: Negative for chest pain and leg swelling.  Gastrointestinal: Negative for abdominal pain, constipation, diarrhea, nausea and vomiting.  Skin: Negative for rash.  Neurological: Positive for weakness.       OBJECTIVE: Vitals:   07/05/19 1511 07/05/19 2237 07/06/19 0334 07/06/19 0853  BP: 135/60 (!) 178/75 (!) 152/80 (!) 125/50  Pulse: 71  86 65  Resp: 20  (!) 23 18  Temp: 98.2 F (36.8 C) 99.5 F (37.5 C) 98.7 F (37.1 C) 99 F (37.2 C)  TempSrc: Oral Oral Oral Oral  SpO2: 96% 97% 92% 95%  Weight:   91.5 kg    Body mass index is 29.79 kg/m.  Physical Exam Constitutional:      General: He is not in acute distress.    Appearance: He is well-developed.  Cardiovascular:     Rate and Rhythm: Normal rate and regular rhythm.     Heart sounds: Normal heart sounds.  Pulmonary:     Effort: Pulmonary effort is normal.     Breath sounds: Normal breath sounds.  Skin:    General: Skin is warm and dry.  Neurological:     Mental Status: He is alert and oriented to person, place, and time.  Psychiatric:        Behavior: Behavior normal.        Thought Content: Thought content normal.        Judgment: Judgment normal.     Lab Results Lab Results  Component Value Date   WBC 7.9 07/05/2019   HGB 13.4 07/05/2019   HCT 39.5 07/05/2019   MCV 99.7  07/05/2019   PLT 104 (L) 07/05/2019    Lab Results  Component Value Date   CREATININE 0.83 07/05/2019   BUN 17 07/05/2019   NA 138 07/05/2019   K 4.2 07/05/2019   CL 108 07/05/2019   CO2 22 07/05/2019    Lab Results  Component Value Date   ALT 22 07/05/2019   AST 45 (H) 07/05/2019   ALKPHOS 54 07/05/2019   BILITOT 2.2 (H) 07/05/2019     Microbiology: Recent Results (from the past 240 hour(s))  SARS CORONAVIRUS 2 (TAT 6-24 HRS) Nasopharyngeal Nasopharyngeal Swab     Status: None   Collection Time: 06/28/19  7:07 PM   Specimen: Nasopharyngeal Swab  Result Value Ref Range Status   SARS Coronavirus 2 NEGATIVE NEGATIVE Final    Comment: (NOTE) SARS-CoV-2 target nucleic acids are NOT DETECTED. The SARS-CoV-2 RNA is generally detectable in upper and lower respiratory specimens during the acute phase of infection.  Negative results do not preclude SARS-CoV-2 infection, do not rule out co-infections with other pathogens, and should not be used as the sole basis for treatment or other patient management decisions. Negative results must be combined with clinical observations, patient history, and epidemiological information. The expected result is Negative. Fact Sheet for Patients: SugarRoll.be Fact Sheet for Healthcare Providers: https://www.woods-mathews.com/ This test is not yet approved or cleared by the [place] FDA and  has been authorized for detection and/or diagnosis of SARS-CoV-2 by FDA under an Emergency Use Authorization (EUA). This EUA will remain  in effect (meaning this test can be used) for the duration of the COVID-19 declaration under Section 56 4(b)(1) of the Act, 21 U.S.C. section 360bbb-3(b)(1), unless the authorization is terminated or revoked sooner. Performed at [facility], [place] [address] [place], [place] [address]   Culture, blood (Routine x 2)     Status: None (Preliminary result)   Collection Time: 07/01/19  8:48 PM   Specimen: BLOOD  Result Value Ref Range Status   Specimen Description BLOOD RIGHT FOREARM  Final   Special Requests   Final    BOTTLES DRAWN AEROBIC AND ANAEROBIC Blood Culture adequate volume Performed at [facility], [place] [address] [place], [place] [address]    Culture NO GROWTH 4 DAYS  Final   Report Status PENDING  Incomplete  Culture, blood (Routine x 2)     Status: None (Preliminary result)   Collection Time: 07/01/19  8:58 PM   Specimen: BLOOD  Result Value Ref Range Status   Specimen Description BLOOD LEFT FOREARM  Final   Special Requests   Final    BOTTLES DRAWN AEROBIC AND ANAEROBIC Blood Culture adequate volume Performed at [facility], [place] [address], [place], [place] [address]    Culture NO GROWTH 4 DAYS  Final   Report Status PENDING  Incomplete  SARS CORONAVIRUS 2  (TAT 6-24 HRS) Nasopharyngeal Nasopharyngeal Swab     Status: None   Collection Time: 07/01/19  9:51 PM   Specimen: Nasopharyngeal Swab  Result Value Ref Range Status   SARS Coronavirus 2 NEGATIVE NEGATIVE Final    Comment: (NOTE) SARS-CoV-2 target nucleic acids are NOT DETECTED. The SARS-CoV-2 RNA is generally detectable in upper and lower respiratory specimens during the acute phase of infection. Negative results do not preclude SARS-CoV-2 infection, do not rule out co-infections with other pathogens, and should not be used as the sole basis for treatment or other patient management decisions. Negative results must be combined with clinical  observations, patient history, and epidemiological information. The expected result is Negative. Fact Sheet for Patients: SugarRoll.be Fact Sheet for Healthcare Providers: https://www.woods-mathews.com/ This test is not yet approved or cleared by the [place] FDA and  has been authorized for detection and/or diagnosis of SARS-CoV-2 by FDA under an Emergency Use Authorization (EUA). This EUA will remain  in effect (meaning this test can be used) for the duration of the COVID-19 declaration under Section 56 4(b)(1) of the Act, 21 U.S.C. section 360bbb-3(b)(1), unless the authorization is terminated or revoked sooner. Performed at [facility], [place] [address], [place], [place] [address]   Body fluid culture     Status: None (Preliminary result)   Collection Time: 07/04/19  2:20 PM   Specimen: Neck; Body Fluid  Result Value Ref Range Status   Specimen Description NECK  Final   Special Requests NONE  Final   Gram Stain   Final    RARE WBC PRESENT,BOTH PMN AND MONONUCLEAR RARE GRAM POSITIVE COCCI    Culture   Final    CULTURE REINCUBATED FOR BETTER GROWTH Performed at [facility], [place] [address], [place], [place] [address]    Report Status PENDING  Incomplete  MRSA PCR Screening      Status: None   Collection Time: 07/05/19 11:18 AM   Specimen: Nasal Mucosa; Nasopharyngeal  Result Value Ref Range Status   MRSA by PCR NEGATIVE NEGATIVE Final    Comment:        The GeneXpert MRSA Assay (FDA approved for NASAL specimens only), is one component of a comprehensive MRSA colonization surveillance program. It is not intended to diagnose MRSA infection nor to guide or monitor treatment for MRSA infections. Performed at [facility], [place] [address], [place],  [address]      Terri Piedra, [place]  07/06/2019  12:45 PM

## 2019-07-06 NOTE — Progress Notes (Addendum)
PROGRESS NOTE  Glen Finley IOE:[id number] DOB: 1934/07/24   PCP: Shon Baton, MD  Patient is from: Home  DOA: 07/01/2019 LOS: 5  Brief Narrative / Interim history: 85 year old male with history of myasthenia gravis/progressive gait disorder not on meds, PD vs Lewy body disease, bipolar disorder, migraine, diastolic CHF, HTN, HLD, cirrhosis, transaminitis and fatty liver disease brought to ED by EMS for fever and altered mental status and admitted for sepsis.  He was started on broad-spectrum antibiotics.  Later in the course, he was diagnosed with retropharyngeal and right neck abscess.    MRI cervical spine on 4/11 revealed abnormal retropharyngeal/prevertebral edema with associated phlegmonous change within the lower right neck concerning for acute infection with probable superimposed 2 cm abscess.  CT soft tissue neck with contrast on 4/12 redemonstrated abnormal prevertebral/retropharyngeal fluid or edema extending from the C1-C2 level inferiorly to the C7 level. Extensive inflammatory stranding and phlegmonous change extending into the right neck soft tissues centered at the C5 level. Findings suspicious for an associated 3.0 x 2.0 abscess along the right posterior aspect of the thyroid cartilage at the C5 level. Heterogeneous enhancement of the right thyroid lobe suspicious for secondary thyroiditis  ENT, ID and IR following.  Patient had US guided FNA of right neck mass by IR on 4/13.  Culture with rare GPC.  Blood cultures negative so far. Cytology pending. He was on vancomycin and ceftriaxone, and transitioned to Unasyn on 4/14 .   Subjective: Seen and examined earlier this morning.  No major events overnight of this morning.  No complaints this morning.  Denies neck pain, chest pain, shortness of breath, dysphagia or odynophagia.  Denies GI or UTI symptoms.  Objective: Vitals:   07/05/19 1511 07/05/19 2237 07/06/19 0334 07/06/19 0853  BP: 135/60 (!) 178/75 (!) 152/80 (!) 125/50   Pulse: 71  86 65  Resp: 20  (!) 23 18  Temp: 98.2 F (36.8 C) 99.5 F (37.5 C) 98.7 F (37.1 C) 99 F (37.2 C)  TempSrc: Oral Oral Oral Oral  SpO2: 96% 97% 92% 95%  Weight:   91.5 kg     Intake/Output Summary (Last 24 hours) at 07/06/2019 1150 Last data filed at 07/05/2019 2130 Gross per 24 hour  Intake 650 ml  Output 1 ml  Net 649 ml   Filed Weights   07/04/19 0434 07/05/19 0439 07/06/19 0334  Weight: 90.6 kg 91.8 kg 91.5 kg    Examination:  GENERAL: No acute distress.  Appears well.  HEENT: MMM.  Vision and hearing grossly intact.  No trismus. NECK: Supple.  No apparent JVD.  FROM without pain.  No palpable LAD or mass.  No tenderness. RESP:  No IWOB. Good air movement bilaterally. CVS:  RRR. Heart sounds normal.  ABD/GI/GU: Bowel sounds present. Soft. Non tender.  MSK/EXT:  Moves extremities. No apparent deformity. No edema.  SKIN: Diffuse erythematous macules over BLE NEURO: Awake, alert and oriented appropriately.  No apparent focal neuro deficit. PSYCH: Calm. Normal affect.   Procedures:  4/13- US guided FNA of right neck  Microbiology summarized: 4/10-COVID-19 PCR negative. 4/10-blood cultures NGTD so far 4/13-right neck tissue culture with GPC  Assessment & Plan: Sepsis, secondary to retropharyngeal infection/right neck abscess-clinically improving. -Blood cultures and MRSA PCR negative.   -MRI cervical spine w/wo contrast and CT soft tissue of neck with contrast as above. -[place] FNA by IR on 4/13.  Tissue cultures with GPC's-speciation pending.  -ID and ENT following-descalated from vancomycin and ceftriaxone to Unasyn  by ID on 4/14  Addendum:  Cytology with malignant cells.  Discussed with oncology, Dr. Salem Senate who suggested ENT evaluation and repeat biopsy.  Left message for [facility].  He was previously seen by Dr. Blenda Nicely. Updated patient's wife over the phone  Acute respiratory failure with hypoxia-likely due to the above.  Saturating in low 90s on 1 L  by [address] -Wean oxygen as able -Encourage incentive spirometry -OOB  Acute hepatic encephalopathy, fatty liver disease, cirrhosis: Ammonia 92 on admit.  Mental status improved.  Alert and oriented x3. -Continue lactulose and titrate for 2-3 BM a day. -Recheck ammonia in the morning.  Chronic diastolic heart failure: Stable -Continue holding Lasix.  Myasthenia gravis, progressive gait disorder -Follow-up with neurology outpatient -PT/OT  Essential hypertension: Normotensive -Continue lisinopril, atenolol, hydralazine  Depression -Continue amitriptyline, lexapro   Thrombocytopenia: Likely due to cirrhosis.  Improving. -Continue monitoring                 DVT prophylaxis: SCD in the setting of thrombocytopenia Code Status: Full code Family Communication: Updated patient's wife over the phone.  Discharge barrier: Retropharyngeal/leg infection with abscess requiring IV antibiotics and respiratory failure with hypoxia requiring supplemental oxygen Patient is from: Home Final disposition: Likely home in the next 24-48 hours pending culture speciation and clearance by consultants.   Consultants:  Infectious disease ENT IR   Sch Meds:  Scheduled Meds: . amitriptyline  25 mg Oral QHS  . atenolol  25 mg Oral q AM  . escitalopram  10 mg Oral q AM  . hydrALAZINE  10 mg Oral Q8H  . lactulose  20 g Oral TID  . lisinopril  20 mg Oral BID  . sodium chloride flush  10-40 mL Intracatheter Q12H   Continuous Infusions: . ampicillin-sulbactam (UNASYN) IV 3 g (07/06/19 0558)   PRN Meds:.hydrALAZINE, sodium chloride flush  Antimicrobials: Anti-infectives (From admission, onward)   Start     Dose/Rate Route Frequency Ordered Stop   07/05/19 1300  Ampicillin-Sulbactam (UNASYN) 3 g in sodium chloride 0.9 % 100 mL IVPB     3 g 200 mL/hr over 30 Minutes Intravenous Every 6 hours 07/05/19 1226     07/04/19 2300  vancomycin (VANCOREADY) IVPB 1750 mg/350 mL  Status:   Discontinued     1,750 mg 175 mL/hr over 120 Minutes Intravenous Every 24 hours 07/04/19 0754 07/05/19 1226   07/03/19 1600  cefTRIAXone (ROCEPHIN) 2 g in sodium chloride 0.9 % 100 mL IVPB  Status:  Discontinued     2 g 200 mL/hr over 30 Minutes Intravenous Every 24 hours 07/03/19 1437 07/05/19 1226   07/02/19 2300  vancomycin (VANCOREADY) IVPB 1500 mg/300 mL  Status:  Discontinued     1,500 mg 150 mL/hr over 120 Minutes Intravenous Every 24 hours 07/01/19 2223 07/04/19 0754   07/02/19 0600  ceFEPIme (MAXIPIME) 2 g in sodium chloride 0.9 % 100 mL IVPB  Status:  Discontinued     2 g 200 mL/hr over 30 Minutes Intravenous Every 8 hours 07/01/19 2223 07/03/19 1437   07/02/19 0600  metroNIDAZOLE (FLAGYL) IVPB 500 mg  Status:  Discontinued     500 mg 100 mL/hr over 60 Minutes Intravenous Every 8 hours 07/01/19 2350 07/05/19 1226   07/01/19 2130  vancomycin (VANCOREADY) IVPB 1750 mg/350 mL     1,750 mg 175 mL/hr over 120 Minutes Intravenous  Once 07/01/19 2115 07/02/19 0235   07/01/19 2115  ceFEPIme (MAXIPIME) 2 g in sodium chloride 0.9 % 100 mL  IVPB  Status:  Discontinued     2 g 200 mL/hr over 30 Minutes Intravenous Every 12 hours 07/01/19 2109 07/01/19 2223   07/01/19 2115  metroNIDAZOLE (FLAGYL) IVPB 500 mg     500 mg 100 mL/hr over 60 Minutes Intravenous  Once 07/01/19 2109 07/02/19 0025   07/01/19 2115  vancomycin (VANCOCIN) IVPB 1000 mg/200 mL premix  Status:  Discontinued     1,000 mg 200 mL/hr over 60 Minutes Intravenous  Once 07/01/19 2109 07/01/19 2115       I have personally reviewed the following labs and images: CBC: Recent Labs  Lab 07/01/19 2057 07/02/19 0640 07/03/19 0440 07/04/19 0248 07/05/19 0315  WBC 9.9 11.2* 9.0 8.3 7.9  NEUTROABS 6.7  --   --   --   --   HGB 14.8 13.0 13.0 12.9* 13.4  HCT 43.6 38.5* 38.8* 38.4* 39.5  MCV 98.9 98.7 99.2 99.0 99.7  PLT 94* 74* 73* 90* 104*   BMP &GFR Recent Labs  Lab 06/30/19 0805 07/01/19 2057 07/03/19 0440  07/04/19 0248 07/05/19 0315  NA 140 137 138 138 138  K 3.6 4.0 3.9 3.6 4.2  CL 105 106 109 109 108  CO2 23 21* 22 20* 22  GLUCOSE 102* 123* 92 98 89  BUN 23 22 21 17 17   CREATININE 1.12 1.02 0.98 0.82 0.83  CALCIUM 8.7* 8.9 8.1* 7.8* 7.9*   Estimated Creatinine Clearance: 75.4 mL/min (by C-G formula based on SCr of 0.83 mg/dL). Liver & Pancreas: Recent Labs  Lab 06/30/19 0805 07/01/19 2057 07/03/19 0440 07/04/19 0248 07/05/19 0315  AST 64* 88* 49* 48* 45*  ALT 28 32 23 23 22   ALKPHOS 63 66 53 59 54  BILITOT 3.2* 2.6* 3.7* 2.3* 2.2*  PROT 6.3* 6.2* 5.7* 5.5* 5.4*  ALBUMIN 2.7* 2.6* 2.2* 2.1* 2.2*   No results for input(s): LIPASE, AMYLASE in the last 168 hours. Recent Labs  Lab 07/01/19 2058 07/02/19 0640  AMMONIA 92* 40*   Diabetic: No results for input(s): HGBA1C in the last 72 hours. No results for input(s): GLUCAP in the last 168 hours. Cardiac Enzymes: No results for input(s): CKTOTAL, CKMB, CKMBINDEX, TROPONINI in the last 168 hours. No results for input(s): PROBNP in the last 8760 hours. Coagulation Profile: Recent Labs  Lab 07/01/19 2057  INR 1.5*   Thyroid Function Tests: No results for input(s): TSH, T4TOTAL, FREET4, T3FREE, THYROIDAB in the last 72 hours. Lipid Profile: No results for input(s): CHOL, HDL, LDLCALC, TRIG, CHOLHDL, LDLDIRECT in the last 72 hours. Anemia Panel: No results for input(s): VITAMINB12, FOLATE, FERRITIN, TIBC, IRON, RETICCTPCT in the last 72 hours. Urine analysis:    Component Value Date/Time   COLORURINE AMBER (A) 07/01/2019 2058   APPEARANCEUR CLEAR 07/01/2019 2058   LABSPEC 1.018 07/01/2019 2058   PHURINE 6.0 07/01/2019 2058   [place] NEGATIVE 07/01/2019 2058   HGBUR NEGATIVE 07/01/2019 2058   [place] NEGATIVE 07/01/2019 2058   [place] NEGATIVE 07/01/2019 2058   PROTEINUR NEGATIVE 07/01/2019 2058   UROBILINOGEN 1.0 08/10/2011 1826   NITRITE NEGATIVE 07/01/2019 2058   LEUKOCYTESUR NEGATIVE 07/01/2019 2058    Sepsis Labs: Invalid input(s): PROCALCITONIN, [place]  Microbiology: Recent Results (from the past 240 hour(s))  SARS CORONAVIRUS 2 (TAT 6-24 HRS) Nasopharyngeal Nasopharyngeal Swab     Status: None   Collection Time: 06/28/19  7:07 PM   Specimen: Nasopharyngeal Swab  Result Value Ref Range Status   SARS Coronavirus 2 NEGATIVE NEGATIVE Final    Comment: (NOTE)  SARS-CoV-2 target nucleic acids are NOT DETECTED. The SARS-CoV-2 RNA is generally detectable in upper and lower respiratory specimens during the acute phase of infection. Negative results do not preclude SARS-CoV-2 infection, do not rule out co-infections with other pathogens, and should not be used as the sole basis for treatment or other patient management decisions. Negative results must be combined with clinical observations, patient history, and epidemiological information. The expected result is Negative. Fact Sheet for Patients: SugarRoll.be Fact Sheet for Healthcare Providers: https://www.woods-mathews.com/ This test is not yet approved or cleared by the [place] FDA and  has been authorized for detection and/or diagnosis of SARS-CoV-2 by FDA under an Emergency Use Authorization (EUA). This EUA will remain  in effect (meaning this test can be used) for the duration of the COVID-19 declaration under Section 56 4(b)(1) of the Act, 21 U.S.C. section 360bbb-3(b)(1), unless the authorization is terminated or revoked sooner. Performed at [facility], [place] [address] [place], [place] [address]   Culture, blood (Routine x 2)     Status: None (Preliminary result)   Collection Time: 07/01/19  8:48 PM   Specimen: BLOOD  Result Value Ref Range Status   Specimen Description BLOOD RIGHT FOREARM  Final   Special Requests   Final    BOTTLES DRAWN AEROBIC AND ANAEROBIC Blood Culture adequate volume Performed at [facility], [place] [address] [place], [place]  [address]    Culture NO GROWTH 4 DAYS  Final   Report Status PENDING  Incomplete  Culture, blood (Routine x 2)     Status: None (Preliminary result)   Collection Time: 07/01/19  8:58 PM   Specimen: BLOOD  Result Value Ref Range Status   Specimen Description BLOOD LEFT FOREARM  Final   Special Requests   Final    BOTTLES DRAWN AEROBIC AND ANAEROBIC Blood Culture adequate volume Performed at [facility], [place] [address] [place], [place] [address]    Culture NO GROWTH 4 DAYS  Final   Report Status PENDING  Incomplete  SARS CORONAVIRUS 2 (TAT 6-24 HRS) Nasopharyngeal Nasopharyngeal Swab     Status: None   Collection Time: 07/01/19  9:51 PM   Specimen: Nasopharyngeal Swab  Result Value Ref Range Status   SARS Coronavirus 2 NEGATIVE NEGATIVE Final    Comment: (NOTE) SARS-CoV-2 target nucleic acids are NOT DETECTED. The SARS-CoV-2 RNA is generally detectable in upper and lower respiratory specimens during the acute phase of infection. Negative results do not preclude SARS-CoV-2 infection, do not rule out co-infections with other pathogens, and should not be used as the sole basis for treatment or other patient management decisions. Negative results must be combined with clinical observations, patient history, and epidemiological information. The expected result is Negative. Fact Sheet for Patients: SugarRoll.be Fact Sheet for Healthcare Providers: https://www.woods-mathews.com/ This test is not yet approved or cleared by the [place] FDA and  has been authorized for detection and/or diagnosis of SARS-CoV-2 by FDA under an Emergency Use Authorization (EUA). This EUA will remain  in effect (meaning this test can be used) for the duration of the COVID-19 declaration under Section 56 4(b)(1) of the Act, 21 U.S.C. section 360bbb-3(b)(1), unless the authorization is terminated or revoked sooner. Performed at [facility], [place]  [address], [place], [place] [address]   Body fluid culture     Status: None (Preliminary result)   Collection Time: 07/04/19  2:20 PM   Specimen: Neck; Body Fluid  Result Value Ref  Range Status   Specimen Description NECK  Final   Special Requests NONE  Final   Gram Stain   Final    RARE WBC PRESENT,BOTH PMN AND MONONUCLEAR RARE GRAM POSITIVE COCCI    Culture   Final    CULTURE REINCUBATED FOR BETTER GROWTH Performed at [facility], [place] [address], [place], [place] [address]    Report Status PENDING  Incomplete  MRSA PCR Screening     Status: None   Collection Time: 07/05/19 11:18 AM   Specimen: Nasal Mucosa; Nasopharyngeal  Result Value Ref Range Status   MRSA by PCR NEGATIVE NEGATIVE Final    Comment:        The GeneXpert MRSA Assay (FDA approved for NASAL specimens only), is one component of a comprehensive MRSA colonization surveillance program. It is not intended to diagnose MRSA infection nor to guide or monitor treatment for MRSA infections. Performed at [facility], [place] [address], [place], [place] [address]     Radiology Studies: No results found.   Luis Sami T. [place]  If 7PM-7AM, please contact night-coverage [email or web address] Password [facility] 07/06/2019, 11:50 AM

## 2019-07-07 DIAGNOSIS — B957 Other staphylococcus as the cause of diseases classified elsewhere: Secondary | ICD-10-CM

## 2019-07-07 DIAGNOSIS — R410 Disorientation, unspecified: Secondary | ICD-10-CM

## 2019-07-07 DIAGNOSIS — R221 Localized swelling, mass and lump, neck: Secondary | ICD-10-CM

## 2019-07-07 LAB — AMMONIA: Ammonia: 40 umol/L — ABNORMAL HIGH (ref 9–35)

## 2019-07-07 MED ORDER — AMOXICILLIN-POT CLAVULANATE 875-125 MG PO TABS: 1.0000 | ORAL_TABLET | Freq: Two times a day (BID) | ORAL | 0 refills | Status: DC

## 2019-07-07 MED ORDER — QUETIAPINE FUMARATE 25 MG PO TABS
25.0000 mg | ORAL_TABLET | Freq: Every day | ORAL | Status: DC
  Administered 2019-07-07: 25 mg via ORAL
  Filled 2019-07-07: qty 1

## 2019-07-07 MED ORDER — QUETIAPINE FUMARATE 25 MG PO TABS
25.0000 mg | ORAL_TABLET | Freq: Every day | ORAL | Status: DC | PRN
  Administered 2019-07-07: 25 mg via ORAL
  Filled 2019-07-07: qty 1

## 2019-07-07 MED ORDER — LACTULOSE 10 GM/15ML PO SOLN
30.0000 g | Freq: Three times a day (TID) | ORAL | Status: DC
  Administered 2019-07-07 – 2019-07-17 (×26): 30 g via ORAL
  Filled 2019-07-07 (×26): qty 45

## 2019-07-07 MED FILL — AMOX-CLAV 875-125 MG TABLET: 875-125 | 21 days supply | Qty: 42 | Fill #0

## 2019-07-07 NOTE — Progress Notes (Signed)
Occupational Therapy Treatment Patient Details Name: Glen Finley MRN: [id number] DOB: [birth year] Today's Date: 07/07/2019    History of present illness Glen Finley is an 85 y.o. 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  He was recently admitted to the hospital and discharged 06/30/19 after being treated for acute hepatic encephalopathy.   OT comments  Pt seen for OT follow up with focus on BADL mobility progression and safety. Pt completed functional mobility a household distance with min A and RW with consistent safety cueing. Pt then anticipated needs to have BM and went into bathroom in room with min A for safe descent. Pt required mod A for peri hygiene for efficient task completion. Pt presents with cognitive deficits in awareness, safety, attention, and problem solving. Pt was able to orient himself, but was hallucinating a man in the room that he was fearful of. Updated recommendations to CIR to progress pt to more independent functional baseline to be able to safely return home with family. Will continue to follow.   Follow Up Recommendations  CIR    Equipment Recommendations  3 in 1 bedside commode    Recommendations for Other Services      Precautions / Restrictions Precautions Precautions: Fall Restrictions Weight Bearing Restrictions: No       Mobility Bed Mobility Overal bed mobility: Needs Assistance Bed Mobility: Supine to Sit     Supine to sit: Min assist;HOB elevated     General bed mobility comments: up in recliner  Transfers Overall transfer level: Needs assistance Equipment used: Rolling walker (2 wheeled) Transfers: Sit to/from Stand Sit to Stand: Min assist         General  transfer comment: Assist to bring hips up and for balance. Verbal cues for hand placement.     Balance Overall balance assessment: Needs assistance Sitting-balance support: Feet supported;Bilateral upper extremity supported Sitting balance-Leahy Scale: Poor Sitting balance - Comments: UE support  Postural control: Right lateral lean Standing balance support: Bilateral upper extremity supported;During functional activity Standing balance-Leahy Scale: Poor Standing balance comment: walker and min assist for static standing                           ADL either performed or assessed with clinical judgement   ADL Overall ADL's : Needs assistance/impaired Eating/Feeding: Set up;Sitting   Grooming: Standing;Minimal assistance Grooming Details (indicate cue type and reason): intermittent min A to maintain safe standing balance                 Toilet Transfer: Minimal assistance;RW;Ambulation;Regular Toilet;Grab bars Toilet Transfer Details (indicate cue type and reason): min A and RW for steadying to walk to toilet in bathroom. increased assist for controlled descent Toileting- Clothing Manipulation and Hygiene: Moderate assistance;Sit to/from stand Toileting - Clothing Manipulation Details (indicate cue type and reason): mod A after BM for full and efficient clean up. Pt able to reach and attempt with sitting leans but missing large areas in process     Functional mobility during ADLs: Minimal assistance;Rolling walker;Cueing for safety General ADL Comments: pt hallucinating people in room this date     Vision Patient Visual Report: No change from baseline     Perception     Praxis  Cognition Arousal/Alertness: Awake/alert Behavior During Therapy: WFL for tasks assessed/performed Overall Cognitive Status: History of cognitive impairments - at baseline Area of Impairment: Attention;Memory;Safety/judgement;Awareness;Problem solving                    Current Attention Level: Sustained Memory: Decreased short-term memory   Safety/Judgement: Decreased awareness of safety;Decreased awareness of deficits Awareness: Emergent Problem Solving: Requires verbal cues;Requires tactile cues General Comments: pt hallucinating a man in his room this date, very descriptive with details and feaful of this man        Exercises     Shoulder Instructions       General Comments VSS    Pertinent Vitals/ Pain       Pain Assessment: No/denies pain  Home Living                                          Prior Functioning/Environment              Frequency  Min 2X/week        Progress Toward Goals  OT Goals(current goals can now be found in the care plan section)  Progress towards OT goals: Progressing toward goals  Acute Rehab OT Goals Patient Stated Goal: to go home OT Goal Formulation: With patient Time For Goal Achievement: 07/17/19 Potential to Achieve Goals: Good  Plan Discharge plan needs to be updated    Co-evaluation                 AM-PAC OT "6 Clicks" Daily Activity     Outcome Measure   Help from another person eating meals?: None Help from another person taking care of personal grooming?: A Little Help from another person toileting, which includes using toliet, bedpan, or urinal?: A Little Help from another person bathing (including washing, rinsing, drying)?: A Little Help from another person to put on and taking off regular upper body clothing?: A Little Help from another person to put on and taking off regular lower body clothing?: A Little 6 Click Score: 19    End of Session Equipment Utilized During Treatment: Gait belt;Rolling walker  OT Visit Diagnosis: Unsteadiness on feet (R26.81);Muscle weakness (generalized) (M62.81)   Activity Tolerance Patient tolerated treatment well   Patient Left in chair;with call bell/phone within reach;with chair alarm set;with family/visitor  present   Nurse Communication Mobility status        Time: 4854-6270 OT Time Calculation (min): 27 min  Charges: OT General Charges $OT Visit: 1 Visit OT Treatments $Self Care/Home Management : 23-37 mins  Zenovia Jarred, MSOT, OTR/L [facility] Office Number: [phone] Pager: [phone]  Zenovia Jarred 07/07/2019, 2:39 PM

## 2019-07-07 NOTE — Progress Notes (Signed)
[facility]  Date of Admission:  07/01/2019     Total days of antibiotics 7         ASSESSMENT:  [place] pathology from fine needle biopsy showing malignant cells with plans for further biopsy today. Culture growing Staphylococcus epidermidis which is likely a contaminant. Will continue current Unasyn pending further biopsy results. If malignancy is confirmed antibiotics can be discontinued.   PLAN:  1. Continue Unasyn.  2. Fine needle aspiration by IR today to confirm malignancy.  Principal Problem:   Retropharyngeal abscess Active Problems:   Hypertension   Myasthenia gravis without exacerbation ([place])   Acute hepatic encephalopathy   Sepsis ([place])   Liver cirrhosis ([place])   . amitriptyline  25 mg Oral QHS  . atenolol  25 mg Oral q AM  . escitalopram  10 mg Oral q AM  . hydrALAZINE  10 mg Oral Q8H  . lactulose  20 g Oral TID  . lisinopril  20 mg Oral BID  . sodium chloride flush  10-40 mL Intracatheter Q12H    SUBJECTIVE:  Afebrile overnight with increased levels of confusion overnight and this morning. Fine needle biopsy with malignant cells present and also Staphylococcus epidermidis.   Allergies  Allergen Reactions  . Amlodipine Swelling and Other (See Comments)    CAUSED SEVERE SWELLING OF THE LEGS THAT REQUIRED A TRIP TO THE MD'S OFFICE  . Cherry Other (See Comments)    Migraines   . Onion Other (See Comments)    Can eat cooked onions ONLY (or, patient develops migraines)  . Codeine Rash     Review of Systems: Review of Systems  Constitutional: Negative for chills, fever and weight loss.  Respiratory: Negative for cough, shortness of breath and wheezing.   Cardiovascular: Negative for chest pain and leg swelling.  Gastrointestinal: Negative for abdominal pain, constipation, diarrhea, nausea and vomiting.  Skin: Negative for rash.      OBJECTIVE: Vitals:   07/06/19 1544 07/06/19 2003 07/07/19 0337 07/07/19 1132    BP: 139/66 (!) 169/74 (!) 155/69 (!) 157/85  Pulse: 69 73 79 85  Resp: 19 20 18 18   Temp: 98.6 F (37 C) 98.4 F (36.9 C) 98.2 F (36.8 C) 98.8 F (37.1 C)  TempSrc: Oral Oral Oral Oral  SpO2: 95% 97% 95% 93%  Weight:   92.8 kg    Body mass index is 30.21 kg/m.  Physical Exam Constitutional:      General: He is not in acute distress.    Appearance: He is well-developed.  Cardiovascular:     Rate and Rhythm: Normal rate and regular rhythm.     Heart sounds: Normal heart sounds.  Pulmonary:     Effort: Pulmonary effort is normal.     Breath sounds: Normal breath sounds.  Skin:    General: Skin is warm and dry.  Neurological:     Mental Status: He is alert and oriented to person, place, and time.  Psychiatric:        Behavior: Behavior normal.        Thought Content: Thought content normal.        Judgment: Judgment normal.     Lab Results Lab Results  Component Value Date   WBC 7.9 07/05/2019   HGB 13.4 07/05/2019   HCT 39.5 07/05/2019   MCV 99.7 07/05/2019   PLT 104 (L) 07/05/2019    Lab Results  Component Value Date   CREATININE 0.83 07/05/2019   BUN  17 07/05/2019   NA 138 07/05/2019   K 4.2 07/05/2019   CL 108 07/05/2019   CO2 22 07/05/2019    Lab Results  Component Value Date   ALT 22 07/05/2019   AST 45 (H) 07/05/2019   ALKPHOS 54 07/05/2019   BILITOT 2.2 (H) 07/05/2019     Microbiology: Recent Results (from the past 240 hour(s))  SARS CORONAVIRUS 2 (TAT 6-24 HRS) Nasopharyngeal Nasopharyngeal Swab     Status: None   Collection Time: 06/28/19  7:07 PM   Specimen: Nasopharyngeal Swab  Result Value Ref Range Status   SARS Coronavirus 2 NEGATIVE NEGATIVE Final    Comment: (NOTE) SARS-CoV-2 target nucleic acids are NOT DETECTED. The SARS-CoV-2 RNA is generally detectable in upper and lower respiratory specimens during the acute phase of infection. Negative results do not preclude SARS-CoV-2 infection, do not rule out co-infections with other  pathogens, and should not be used as the sole basis for treatment or other patient management decisions. Negative results must be combined with clinical observations, patient history, and epidemiological information. The expected result is Negative. Fact Sheet for Patients: SugarRoll.be Fact Sheet for Healthcare Providers: https://www.woods-mathews.com/ This test is not yet approved or cleared by the [place] FDA and  has been authorized for detection and/or diagnosis of SARS-CoV-2 by FDA under an Emergency Use Authorization (EUA). This EUA will remain  in effect (meaning this test can be used) for the duration of the COVID-19 declaration under Section 56 4(b)(1) of the Act, 21 U.S.C. section 360bbb-3(b)(1), unless the authorization is terminated or revoked sooner. Performed at [facility], [place] [address], [place], [place] [address]   Culture, blood (Routine x 2)     Status: None   Collection Time: 07/01/19  8:48 PM   Specimen: BLOOD  Result Value Ref Range Status   Specimen Description BLOOD RIGHT FOREARM  Final   Special Requests   Final    BOTTLES DRAWN AEROBIC AND ANAEROBIC Blood Culture adequate volume   Culture   Final    NO GROWTH 5 DAYS Performed at [facility], [place] [address], [place], [place] [address]    Report Status 07/06/2019 FINAL  Final  Culture, blood (Routine x 2)     Status: None   Collection Time: 07/01/19  8:58 PM   Specimen: BLOOD  Result Value Ref Range Status   Specimen Description BLOOD LEFT FOREARM  Final   Special Requests   Final    BOTTLES DRAWN AEROBIC AND ANAEROBIC Blood Culture adequate volume   Culture   Final    NO GROWTH 5 DAYS Performed at [facility], [place] [address] [place], [place] [address]    Report Status 07/06/2019 FINAL  Final  SARS CORONAVIRUS 2 (TAT 6-24 HRS) Nasopharyngeal Nasopharyngeal Swab     Status: None   Collection Time: 07/01/19  9:51 PM   Specimen:  Nasopharyngeal Swab  Result Value Ref Range Status   SARS Coronavirus 2 NEGATIVE NEGATIVE Final    Comment: (NOTE) SARS-CoV-2 target nucleic acids are NOT DETECTED. The SARS-CoV-2 RNA is generally detectable in upper and lower respiratory specimens during the acute phase of infection. Negative results do not preclude SARS-CoV-2 infection, do not rule out co-infections with other pathogens, and should not be used as the sole basis for treatment or other patient management decisions. Negative results must be combined with clinical observations, patient history, and epidemiological information. The expected result is Negative. Fact Sheet for Patients: SugarRoll.be Fact Sheet  for Healthcare Providers: https://www.woods-mathews.com/ This test is not yet approved or cleared by the [place] and  has been authorized for detection and/or diagnosis of SARS-CoV-2 by FDA under an Emergency Use Authorization (EUA). This EUA will remain  in effect (meaning this test can be used) for the duration of the COVID-19 declaration under Section 56 4(b)(1) of the Act, 21 U.S.C. section 360bbb-3(b)(1), unless the authorization is terminated or revoked sooner. Performed at [facility], [place] [address] [place], [place] [address]   Body fluid culture     Status: Abnormal (Preliminary result)   Collection Time: 07/04/19  2:20 PM   Specimen: Neck; Body Fluid  Result Value Ref Range Status   Specimen Description NECK  Final   Special Requests NONE  Final   Gram Stain   Final    RARE WBC PRESENT,BOTH PMN AND MONONUCLEAR RARE GRAM POSITIVE COCCI Performed at [facility], [place] [address], [place], [place] [address]    Culture STAPHYLOCOCCUS EPIDERMIDIS (A)  Final   Report Status PENDING  Incomplete  MRSA PCR Screening     Status: None   Collection Time: 07/05/19 11:18 AM   Specimen: Nasal Mucosa; Nasopharyngeal  Result Value Ref Range Status    MRSA by PCR NEGATIVE NEGATIVE Final    Comment:        The GeneXpert MRSA Assay (FDA approved for NASAL specimens only), is one component of a comprehensive MRSA colonization surveillance program. It is not intended to diagnose MRSA infection nor to guide or monitor treatment for MRSA infections. Performed at [facility], [place] [address], [place], [place] [address]      [place] [facility], [facility]  07/07/2019  12:52 PM

## 2019-07-07 NOTE — Plan of Care (Signed)
?  Problem: Clinical Measurements: ?Goal: Ability to maintain clinical measurements within normal limits will improve ?Outcome: Progressing ?Goal: Will remain free from infection ?Outcome: Progressing ?Goal: Diagnostic test results will improve ?Outcome: Progressing ?Goal: Respiratory complications will improve ?Outcome: Progressing ?Goal: Cardiovascular complication will be avoided ?Outcome: Progressing ?  ?Problem: Activity: ?Goal: Risk for activity intolerance will decrease ?Outcome: Progressing ?  ?Problem: Elimination: ?Goal: Will not experience complications related to bowel motility ?Outcome: Progressing ?Goal: Will not experience complications related to urinary retention ?Outcome: Progressing ?  ?Problem: Pain Managment: ?Goal: General experience of comfort will improve ?Outcome: Progressing ?  ?Problem: Safety: ?Goal: Ability to remain free from injury will improve ?Outcome: Progressing ?  ?Problem: Skin Integrity: ?Goal: Risk for impaired skin integrity will decrease ?Outcome: Progressing ?  ?

## 2019-07-07 NOTE — Consult Note (Signed)
Chief Complaint: Patient was seen in consultation today for right neck mass/biopsy.  Referring Physician(s): Mercy Riding  Supervising Physician: Corrie Mckusick  Patient Status: [facility] - In-pt  History of Present Illness: Reuben Knoblock Hochberg is a 85 y.o. male with a past medical history of hypertension, hyperlipidemia, HF, Parkinson's versus Lewy body disease, myasthenia gravis, migraines, GERD, bipolar disorder, and obesity. He has been admitted to [facility] since 07/01/2019 for management of sepsis. Upon work-up, CT maxillofacial 07/02/2019 and CT soft tissue neck 07/03/2019 revealed a right neck fluid collection. FNA was preformed of this right fluid collection in IR 07/04/2019 which revealed malignant cells. ENT and oncology recommend IR consultation for possible core biopsy of right neck mass to obtain more tissue for further pathologic evaluation.  CT maxillofacial 07/02/2019: 1. The dominant abnormality is prevertebral/retropharyngeal fluid or edema tracking which seems to increase towards the lower neck. Reactive appearing right level 3 lymph nodes. Visible cervical vertebrae appear intact, but in the setting of sepsis lower Cervical Discitis-Osteomyelitis is a leading consideration. Retropharyngeal infection is in the differential. Cervical Spine MRI without and with contrast would be most valuable. Failing that Neck CT with IV contrast would be recommended. 2. Posterior left ethmoid and left sphenoid sinusitis, but has a chronic appearance with no complicating features.  CT soft tissue neck 07/03/2019: 1. Redemonstrated abnormal prevertebral/retropharyngeal fluid or edema extending from the C1-C2 level inferiorly to the C7 level. Extensive inflammatory stranding and phlegmonous change extending into the right neck soft tissues centered at the C5 level. Findings suspicious for an associated 3.0 x 2.0 abscess along the right posterior aspect of the thyroid cartilage at the C5 level. Heterogeneous  enhancement of the right thyroid lobe suspicious for secondary thyroiditis. Findings likely reflect sequela of an infectious process. Close clinical follow-up is recommended with repeat imaging as warranted to exclude alternative etiologies (i.e. Neoplasm). 2. Right cervical lymphadenopathy, likely reactive. 3. Chronic left sphenoid sinusitis.  IR requested by Dr. Cyndia Skeeters for possible image-guided right neck mass biopsy. Patient awake and alert sitting in chair watching TV with no complaints at this time. Accompanied by wife at bedside. Denies fever, chills, chest pain, dyspnea, abdominal pain, or headache.   Past Medical History:  Diagnosis Date  . Abnormality of gait   . GERD (gastroesophageal reflux disease)   . Headache(784.0)   . Hyperlipidemia   . Hypertension   . Migraine   . Myasthenia gravis without exacerbation ([place])   . Obesity   . Other extrapyramidal disease and abnormal movement disorder   . REM sleep behavior disorder   . Unspecified psychosis   . Weakness     Past Surgical History:  Procedure Laterality Date  . CATARACT EXTRACTION Bilateral     Allergies: Amlodipine, Cherry, Onion, and Codeine  Medications: Prior to Admission medications   Medication Sig Start Date End Date Taking? Authorizing Provider  alendronate (FOSAMAX) 70 MG tablet Take 70 mg by mouth every Sunday.  12/17/14  Yes [provider]  amitriptyline (ELAVIL) 25 MG tablet Take 25 mg by mouth at bedtime. 04/19/19  Yes [provider]  aspirin EC 81 MG tablet Take 81 mg by mouth See admin instructions. Take 81 mg by mouth at bedtime on Mon/Wed/Fri (only)   Yes [provider]  atenolol (TENORMIN) 25 MG tablet Take 25 mg by mouth in the morning.  07/04/12  Yes [provider]  Cholecalciferol (VITAMIN D-3) 25 MCG (1000 UT) CAPS Take 1,000 Units by mouth daily with breakfast.  Yes [provider]  escitalopram (LEXAPRO) 10 MG tablet Take 10 mg by mouth in  the morning.  02/13/14  Yes [provider]  fish oil-omega-3 fatty acids 1000 MG capsule Take 1 g by mouth 2 (two) times daily.   Yes [provider]  furosemide (LASIX) 20 MG tablet Take 20 mg by mouth daily as needed for fluid or edema.  01/03/17  Yes [provider]  lactulose (CHRONULAC) 10 GM/15ML solution Take 15 mLs (10 g total) by mouth 2 (two) times daily. 06/30/19  Yes Nita Sells, MD  [facility] ARTHRITIS PAIN RELIEF EX) Apply 1 application topically daily as needed (for right knee pain).   Yes [provider]  lisinopril (ZESTRIL) 20 MG tablet Take 20 mg by mouth 2 (two) times daily. 06/01/19  Yes [provider]  Multiple Vitamins-Minerals (ONE-A-DAY MENS 50+) TABS Take 1 tablet by mouth daily with breakfast.   Yes [provider]  Polyethyl Glyc-Propyl Glyc PF (SYSTANE ULTRA PF) 0.4-0.3 % SOLN Place 1 drop into both eyes 2 (two) times daily as needed (for eye redness or burning).   Yes [provider]  amoxicillin-clavulanate (AUGMENTIN) 875-125 MG tablet Take 1 tablet by mouth 2 (two) times daily for 21 days. 07/07/19 07/28/19  Mercy Riding, MD     History reviewed. No pertinent family history.  Social History   Socioeconomic History  . Marital status: Married    Spouse name: [name]  . Number of children: 2  . Years of education: 46  . Highest education level: Not on file  Occupational History  . Occupation: Network engineer.    Comment: Retired  Tobacco Use  . Smoking status: Never Smoker  . Smokeless tobacco: Never Used  Substance and Sexual Activity  . Alcohol use: No    Alcohol/week: 0.0 standard drinks  . Drug use: No  . Sexual activity: Never  Other Topics Concern  . Not on file  Social History Narrative   Patient lives at home with his wife [name]). Patient is retired. High school education.   Left handed.   Caffeine- One cup daily.   Social Determinants of Health   Financial  Resource Strain:   . Difficulty of Paying Living Expenses:   Food Insecurity:   . Worried About Charity fundraiser in the Last Year:   . Arboriculturist in the Last Year:   Transportation Needs:   . Film/video editor (Medical):   Marland Kitchen Lack of Transportation (Non-Medical):   Physical Activity:   . Days of Exercise per Week:   . Minutes of Exercise per Session:   Stress:   . Feeling of Stress :   Social Connections:   . Frequency of Communication with Friends and Family:   . Frequency of Social Gatherings with Friends and Family:   . Attends Religious Services:   . Active Member of Clubs or Organizations:   . Attends Archivist Meetings:   Marland Kitchen Marital Status:      Review of Systems: A 12 point ROS discussed and pertinent positives are indicated in the HPI above.  All other systems are negative.  Review of Systems  Constitutional: Negative for chills and fever.  Respiratory: Negative for shortness of breath and wheezing.   Cardiovascular: Negative for chest pain and palpitations.  Gastrointestinal: Negative for abdominal pain.  Neurological: Negative for headaches.  Psychiatric/Behavioral: Negative for behavioral problems and confusion.    Vital Signs: BP (!) 157/85 (BP Location:  Left Wrist)   Pulse 85   Temp 98.8 F (37.1 C) (Oral)   Resp 18   Wt 204 lb 9.4 oz (92.8 kg)   SpO2 93%   BMI 30.21 kg/m   Physical Exam Vitals and nursing note reviewed.  Constitutional:      General: He is not in acute distress.    Appearance: Normal appearance.  Cardiovascular:     Rate and Rhythm: Normal rate and regular rhythm.     Heart sounds: Normal heart sounds. No murmur.  Pulmonary:     Effort: Pulmonary effort is normal. No respiratory distress.     Breath sounds: Normal breath sounds. No wheezing.  Skin:    General: Skin is warm and dry.  Neurological:     Mental Status: He is alert.      MD Evaluation Airway: WNL Heart: WNL Abdomen: WNL Chest/ Lungs:  WNL ASA  Classification: 2 Mallampati/Airway Score: One   Imaging: DG Chest 2 View  Result Date: 07/01/2019 CLINICAL DATA:  Altered mental status with fever EXAM: CHEST - 2 VIEW COMPARISON:  06/28/2019, CT chest 09/28/2011 FINDINGS: Mild cardiomegaly. No focal consolidation or pleural effusion. Aortic atherosclerosis. No pneumothorax IMPRESSION: 1. No acute focal airspace disease 2. Mild cardiomegaly Electronically Signed   By: Donavan Foil M.D.   On: 07/01/2019 21:29   CT Head Wo Contrast  Result Date: 06/28/2019 CLINICAL DATA:  Confusion. Increased confusion over the past 2 days. EXAM: CT HEAD WITHOUT CONTRAST TECHNIQUE: Contiguous axial images were obtained from the base of the skull through the vertex without intravenous contrast. COMPARISON:  Head CT and brain MRI May 2013 FINDINGS: Brain: No intracranial hemorrhage, mass effect, or midline shift. Age related atrophy. No hydrocephalus. The basilar cisterns are patent. Moderate to advanced chronic small vessel ischemia. No evidence of territorial infarct or acute ischemia. No extra-axial or intracranial fluid collection. Vascular: Atherosclerosis of skullbase vasculature without hyperdense vessel or abnormal calcification. Skull: No fracture or focal lesion. Sinuses/Orbits: Complete opacification of left side of sphenoid sinus with high-density material. Opacification of posterior left ethmoid air cells. There is some surrounding bony sclerosis. Mild mucosal thickening of right side of sphenoid sinus. Mastoid air cells are clear. Bilateral cataract resection. Other: None. IMPRESSION: 1. No acute intracranial abnormality. 2. Age related atrophy. Moderate to advanced chronic small vessel ischemia. 3. Opacification of left sphenoid sinus and posterior ethmoid air cells with high density material. There is adjacent bony sclerosis suggesting this is chronic sinusitis, however was not seen on 2013 exam. Electronically Signed   By: Keith Rake M.D.    On: 06/28/2019 17:47   CT SOFT TISSUE NECK W CONTRAST  Result Date: 07/03/2019 CLINICAL DATA:  Provided INDICATION: Retropharyngeal/prevertebral edema, abscess, sepsis. EXAM: CT NECK WITH CONTRAST TECHNIQUE: Multidetector CT imaging of the neck was performed using the standard protocol following the bolus administration of intravenous contrast. CONTRAST:  73mL OMNIPAQUE IOHEXOL 300 MG/ML  SOLN COMPARISON:  Cervical spine MRI 07/02/2019, maxillofacial CT 07/02/2019. FINDINGS: Pharynx and larynx: Streak artifact from dental restoration limits evaluation of the oral cavity. There is no appreciable discrete mass or swelling within the oral cavity or nasopharynx. Again demonstrated is abnormal prevertebral/retropharyngeal fluid or edema extending from the C1-C2 level inferiorly to the C7 level. Centered at the C5-C6 level there is prominent inflammatory stranding and phlegmon extending into the right neck soft tissues measuring 4.0 x 4.6 x 7.5 cm (for instance as seen on series 10, image 88 and series 8, image 44). Within this  region at the C5 level, there is a somewhat circumscribed region of hypoattenuation along the right posterior aspect of the thyroid cartilage measuring 3.0 x 2.0 cm in transaxial dimensions (series 10, image 74). Although no well-defined peripheral enhancement is demonstrated, findings are suspicious for early abscess formation. There is associated leftward deviation of the laryngeal airway and subglottic trachea without significant airway narrowing. There is rightward displacement of the right internal jugular vein and common carotid artery. There is heterogeneous enhancement of the right thyroid lobe suggestive of thyroiditis. Salivary glands: Unremarkable. Thyroid: Heterogeneous appearance of the right thyroid lobe with extensive surrounding phlegmonous change/abscess as described below. There are few coarse calcifications within the left thyroid lobe. Lymph nodes: Right cervical chain  lymphadenopathy is likely reactive. Most notably a right level III lymph node measures 13 mm in short axis (series 8, image 30). Vascular: The major vascular structures of the neck appear patent. The right internal jugular vein is significantly narrowed at the level of the mid neck due to phlegmonous change/abscess. Limited intracranial: No abnormality identified. Visualized orbits: Excluded from the field of view. Mastoids and visualized paranasal sinuses: The paranasal sinuses are incompletely imaged. Redemonstrated complete opacification of the left sphenoid sinus with associated reactive osteitis. No significant mastoid effusion. Skeleton: No acute bony abnormality. Redemonstrated cervical spondylosis with multilevel disc space narrowing, posterior disc osteophytes as well as uncovertebral hypertrophy. Upper chest: No consolidation within the imaged lung apices. IMPRESSION: Redemonstrated abnormal prevertebral/retropharyngeal fluid or edema extending from the C1-C2 level inferiorly to the C7 level. Extensive inflammatory stranding and phlegmonous change extending into the right neck soft tissues centered at the C5 level. Findings suspicious for an associated 3.0 x 2.0 abscess along the right posterior aspect of the thyroid cartilage at the C5 level. Heterogeneous enhancement of the right thyroid lobe suspicious for secondary thyroiditis. Findings likely reflect sequela of an infectious process. Close clinical follow-up is recommended with repeat imaging as warranted to exclude alternative etiologies (i.e. Neoplasm). Right cervical lymphadenopathy, likely reactive. Chronic left sphenoid sinusitis. Electronically Signed   By: Kellie Simmering DO   On: 07/03/2019 14:35   MR CERVICAL SPINE W WO CONTRAST  Result Date: 07/03/2019 CLINICAL DATA:  Initial evaluation for acute sepsis, unknown source, concern for discitis. EXAM: MRI CERVICAL SPINE WITHOUT AND WITH CONTRAST TECHNIQUE: Multiplanar and multiecho pulse  sequences of the cervical spine, to include the craniocervical junction and cervicothoracic junction, were obtained without and with intravenous contrast. CONTRAST:  16mL GADAVIST GADOBUTROL 1 MMOL/ML IV SOLN COMPARISON:  Prior maxillofacial CT from earlier the same day. FINDINGS: Alignment: Mild straightening of the normal cervical lordosis. No listhesis. Vertebrae: Vertebral body height maintained without evidence for acute or chronic fracture. Bone marrow signal intensity somewhat diffusely decreased on T1 weighted imaging, nonspecific, but most commonly related to anemia, smoking, or obesity. No discrete or worrisome osseous lesions. Mild reactive endplate changes present about the C4-5 through C6-7 interspaces, degenerative in nature. No findings to suggest osteomyelitis discitis or septic arthritis. No abnormal marrow edema or enhancement. Cord: Signal intensity within the cervical spinal cord is normal. No abnormal enhancement. No epidural abscess or other collection. Posterior Fossa, vertebral arteries, paraspinal tissues: Age-related cerebral atrophy noted within the visualized brain. Craniocervical junction normal. There is abnormal prevertebral/retropharyngeal edema extending from the nasopharynx through the upper thoracic spine. Abnormal edema with phlegmonous change seen involving the right parapharyngeal space, most pronounced at the level of the thyroid cartilage at approximately level III/IV (series 9, image 29). Finding concerning for acute  infection given provided history. There is suggestion of a superimposed abscess at this level measuring up to approximately 2 cm (series 12, image 24), incompletely visualized. No frank loculated retropharyngeal abscess or collection. Prominent right level II/III lymph node measures up to 12 mm, likely reactive (series 8, image 20). Normal flow void seen within the vertebral arteries bilaterally. Disc levels: C2-C3: Small central disc protrusion minimally indents  the ventral thecal sac. No canal or foraminal stenosis. C3-C4: Moderate-sized central disc protrusion indents the ventral thecal sac, contacting and mildly flattening the ventral spinal cord. Resultant moderate spinal stenosis. Superimposed bilateral uncovertebral and facet hypertrophy. Resultant mild left greater than right C4 foraminal stenosis. C4-C5: Chronic intervertebral disc space narrowing with mild diffuse disc bulge. Right greater than left uncovertebral hypertrophy. Broad posterior disc osteophyte flattens and partially faces the ventral thecal sac, greater on the right. Resultant mild-to-moderate spinal stenosis with mild flattening of the right hemi cord. Severe right with moderate left C5 foraminal stenosis. C5-C6: Diffuse disc bulge with bilateral uncovertebral hypertrophy. Broad posterior component effaces the ventral thecal sac and resultant moderate spinal stenosis. Severe right with moderate left C6 foraminal narrowing. C6-C7: Diffuse disc bulge with bilateral uncovertebral hypertrophy. Mild ligament flavum thickening. Resultant mild spinal stenosis without cord impingement. Moderate left worse than right C7 foraminal narrowing. C7-T1:  Negative interspace.  Mild facet hypertrophy.  No stenosis. Visualized upper thoracic spine demonstrates no significant finding. IMPRESSION: 1. Abnormal retropharyngeal/prevertebral edema with associated phlegmonous change within the lower right neck as above, concerning for acute infection. Probable superimposed abscess measuring up to approximately 2 cm, incompletely visualized on this exam. Further evaluation with dedicated soft tissue neck CT with contrast recommended for further evaluation. 2. No evidence for osteomyelitis discitis within the cervical spine. 3. Multilevel cervical spondylosis with resultant mild to moderate diffuse spinal stenosis at C3-4 through C6-7. Associated moderate to severe bilateral C5 through C7 foraminal narrowing as above.  Electronically Signed   By: Jeannine Boga M.D.   On: 07/03/2019 03:29   CT Abdomen Pelvis W Contrast  Result Date: 07/01/2019 CLINICAL DATA:  Cirrhosis EXAM: CT ABDOMEN AND PELVIS WITH CONTRAST TECHNIQUE: Multidetector CT imaging of the abdomen and pelvis was performed using the standard protocol following bolus administration of intravenous contrast. CONTRAST:  180mL OMNIPAQUE IOHEXOL 300 MG/ML  SOLN COMPARISON:  Ultrasound 06/28/2019, CT 06/27/2014 FINDINGS: Lower chest: Lung bases demonstrate streaky atelectasis or scarring within the bases. No acute consolidation or pleural effusion. Borderline to mild cardiomegaly. Hepatobiliary: Cirrhotic morphology of the liver. Subcentimeter hypodensities too small to further characterize. No calcified gallstone or biliary dilatation Pancreas: Unremarkable. No pancreatic ductal dilatation or surrounding inflammatory changes. Spleen: Normal in size without focal abnormality. Adrenals/Urinary Tract: Adrenal glands are normal. Cortical scarring in the mid left kidney. Negative for hydronephrosis. The bladder is normal Stomach/Bowel: Stomach is within normal limits. Appendix appears normal. No evidence of bowel wall thickening, distention, or inflammatory changes. Diverticular disease of the left colon without acute inflammatory change. Vascular/Lymphatic: Moderate aortic atherosclerosis without aneurysm. Subcentimeter retroperitoneal nodes. Cavernous transformation of the portal vein. Numerous varices and collateral vessels within the abdomen. Reproductive: Slightly enlarged prostate Other: Negative for free air or free fluid. Musculoskeletal: Chronic compression deformity of T12. IMPRESSION: 1. Liver cirrhosis with evidence for portal hypertension. 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 2. Left colon diverticular disease without acute inflammatory process. Electronically Signed   By: Donavan Foil  M.D.   On: 07/01/2019 23:26  DG Chest Port 1 View  Result Date: 06/28/2019 CLINICAL DATA:  Increased confusion, cirrhosis EXAM: PORTABLE CHEST 1 VIEW COMPARISON:  None. FINDINGS: No consolidation or edema. No pleural effusion or pneumothorax. Cardiomediastinal contours are within normal limits for portable technique. There is calcified plaque along the aortic arch. IMPRESSION: No acute process in the chest. Electronically Signed   By: Macy Mis M.D.   On: 06/28/2019 15:49   [place] FNA SOFT TISSUE  Result Date: 07/04/2019 INDICATION: 85 year old with a right neck mass. Request for ultrasound-guided aspiration. EXAM: ULTRASOUND-GUIDED FINE NEEDLE ASPIRATION OF RIGHT NECK MASS MEDICATIONS: None. ANESTHESIA/SEDATION: None FLUOROSCOPY TIME:  None COMPLICATIONS: None immediate. PROCEDURE: Informed written consent was obtained from the patient after a thorough discussion of the procedural risks, benefits and alternatives. All questions were addressed. A timeout was performed prior to the initiation of the procedure. Right side of the neck was evaluated with ultrasound. Right neck lesion was identified. The lesion appeared to be mostly solid, therefore, many of the specimens were collected for cytology. Four fine-needle aspirations were performed with 25 gauge needles. Two fine-needle aspirations were obtained with 18 gauge needles. Bandage placed over the puncture site. FINDINGS: Heterogeneous hypoechoic lesion that appears to be inseparable from the thyroid tissue. This soft tissue causes lateral displacement of the right common carotid artery and right internal jugular vein. No discrete fluid collections were identified. No significant fluid could be aspirated from this right neck lesion/mass. One of the 18 gauge fine-needle aspirations was collected for culture. IMPRESSION: Heterogeneous solid right neck mass. This lesion appears to be indistinguishable from the right thyroid tissue. No significant fluid could  be aspirated from this right neck lesion. Ultrasound-guided fine-needle aspirations were obtained from the lesion. Electronically Signed   By: Markus Daft M.D.   On: 07/04/2019 17:42   CT MAXILLOFACIAL WO CONTRAST  Result Date: 07/02/2019 CLINICAL DATA:  85 year old male. Sepsis. Sinus congestion. EXAM: CT MAXILLOFACIAL WITHOUT CONTRAST TECHNIQUE: Multidetector CT imaging of the maxillofacial structures was performed. Multiplanar CT image reconstructions were also generated. COMPARISON:  Head CT 06/28/2019, brain MRI 08/11/2011. FINDINGS: Osseous: The visible cervical vertebrae appear intact (through C5) but there is abnormal prevertebral soft tissue swelling and retropharyngeal or prevertebral fluid/edema (series 3, image 16). This seems to progress caudally in the neck, and becomes eccentric to the right at the level of the thyroid cartilage on series 3, image 87. Mandible intact. No acute dental abnormality identified. Maxilla, zygoma, nasal bones, central skull base and visible calvarium appear intact. Orbits: Intact orbital walls. Postoperative changes to both globes, otherwise negative noncontrast orbits soft tissues. Sinuses: Posterior left ethmoid and left sphenoid sinus opacification with periosteal thickening. But no complicating features are identified, and the remaining paranasal sinuses are well pneumatized. Tympanic cavities and mastoids are clear. Soft tissues: Abnormal prevertebral/retropharyngeal fluid or edema tracking inferiorly in the neck as stated above. There is also a partially retropharyngeal course of the right ICA. There are reactive appearing right level 3 lymph nodes on series 3, image 85. But elsewhere the noncontrast deep soft tissue spaces of the face and neck are within normal limits. Limited intracranial: Stable, No acute intracranial abnormality. Calcified atherosclerosis at the skull base. IMPRESSION: 1. The dominant abnormality is prevertebral/retropharyngeal fluid or edema  tracking which seems to increase towards the lower neck. Reactive appearing right level 3 lymph nodes. Visible cervical vertebrae appear intact, but in the setting of sepsis lower Cervical Discitis-Osteomyelitis is a leading consideration. Retropharyngeal infection is in the differential. Cervical Spine  MRI without and with contrast would be most valuable. Failing that Neck CT with IV contrast would be recommended. 2. Posterior left ethmoid and left sphenoid sinusitis, but has a chronic appearance with no complicating features. Electronically Signed   By: Genevie Ann M.D.   On: 07/02/2019 14:21   US Abdomen Limited RUQ  Result Date: 06/28/2019 CLINICAL DATA:  Altered mental status.  LFT elevation. EXAM: ULTRASOUND ABDOMEN LIMITED RIGHT UPPER QUADRANT COMPARISON:  None. FINDINGS: Gallbladder: No gallstones or wall thickening visualized. No sonographic Murphy sign noted by sonographer. Common bile duct: Diameter: The common bile duct was poorly evaluated. Liver: Diffuse increased echogenicity with slightly heterogeneous liver. Appearance typically secondary to fatty infiltration. Fibrosis secondary consideration. No secondary findings of cirrhosis noted. No focal hepatic lesion or intrahepatic biliary duct dilatation. The liver surface appears nodular. Portal vein is patent on color Doppler imaging with normal direction of blood flow towards the liver. Other: None. IMPRESSION: 1. No evidence for cholelithiasis. 2. The common bile duct was not visualized. 3. Cirrhosis. Electronically Signed   By: Constance Holster M.D.   On: 06/28/2019 21:00    Labs:  CBC: Recent Labs    07/02/19 0640 07/03/19 0440 07/04/19 0248 07/05/19 0315  WBC 11.2* 9.0 8.3 7.9  HGB 13.0 13.0 12.9* 13.4  HCT 38.5* 38.8* 38.4* 39.5  PLT 74* 73* 90* 104*    COAGS: Recent Labs    06/28/19 1531 06/29/19 0253 07/01/19 2057  INR 1.5* 1.4* 1.5*  APTT 39*  --   --     BMP: Recent Labs    07/01/19 2057 07/03/19 0440  07/04/19 0248 07/05/19 0315  NA 137 138 138 138  K 4.0 3.9 3.6 4.2  CL 106 109 109 108  CO2 21* 22 20* 22  GLUCOSE 123* 92 98 89  BUN 22 21 17 17   CALCIUM 8.9 8.1* 7.8* 7.9*  CREATININE 1.02 0.98 0.82 0.83  GFRNONAA >60 >60 >60 >60  GFRAA >60 >60 >60 >60    LIVER FUNCTION TESTS: Recent Labs    07/01/19 2057 07/03/19 0440 07/04/19 0248 07/05/19 0315  BILITOT 2.6* 3.7* 2.3* 2.2*  AST 88* 49* 48* 45*  ALT 32 23 23 22   ALKPHOS 66 53 59 54  PROT 6.2* 5.7* 5.5* 5.4*  ALBUMIN 2.6* 2.2* 2.1* 2.2*     Assessment and Plan:  Right neck mass s/p FNA in IR 07/04/2019 by Dr. Anselm Pancoast revealing malignant cells. Plan for image-guided right neck mass biopsy tentatively for Monday 07/10/2019 in IR. Patient will be NPO at midnight prior to procedure. Afebrile.  Risks and benefits discussed with the patient including, but not limited to bleeding, infection, damage to adjacent structures or low yield requiring additional tests. All of the patient's questions were answered, patient is agreeable to proceed. Consent obtained by patient's wife, Arjan Strohm, signed and in chart.   Thank you for this interesting consult.  I greatly enjoyed meeting Smitty Ackerley Umbarger and look forward to participating in their care.  A copy of this report was sent to the requesting provider on this date.  Electronically Signed: Earley Abide, PA-C 07/07/2019, 4:14 PM   I spent a total of 40 Minutes in face to face in clinical consultation, greater than 50% of which was counseling/coordinating care for right neck mass/biopsy.

## 2019-07-07 NOTE — Progress Notes (Signed)
[facility] notified patient is confused and that Jonni Sanger the grandson is willing to come and sit with patient to ease his anxiety and that a care orders is needed to allow patient grandson to visit. Security was notified to allow visitation. Arthor Captain LPN

## 2019-07-07 NOTE — Progress Notes (Signed)
Rehab Admissions Coordinator Note:  Per PT recommendation, this patient was screened by Raechel Ache for appropriateness for an Inpatient Acute Rehab Consult.  At this time, we are recommending an Inpatient Rehab consult. AC will place contact order in the chart per protocol.   Raechel Ache 07/07/2019, 1:16 PM  I can be reached at [phone].

## 2019-07-07 NOTE — Progress Notes (Signed)
ENT Consult Progress Note  Subjective: Voice normal. No odynophagia, no stridor.  Poor appetite. No fevers.  Has had increased intermittent confusion/disorientation.    Objective: Vitals:   07/06/19 2003 07/07/19 0337  BP: (!) 169/74 (!) 155/69  Pulse: 73 79  Resp: 20 18  Temp: 98.4 F (36.9 C) 98.2 F (36.8 C)  SpO2: 97% 95%    Physical Exam: CONSTITUTIONAL: well developed, nourished, no distress  CARDIOVASCULAR: normal rate and regular rhythm PULMONARY/CHEST WALL: effort normal and no stridor, no stertor, no dysphonia HENT: Head : normocephalic and atraumatic Ears: Right ear:   canal normal, external ear normal and hearing normal Left ear:   canal normal, external ear normal and hearing normal Nose: nose normal and no purulence Mouth/Throat:  Mouth: uvula midline and no oral lesions Throat: oropharynx clear and moist Mucous membranes: normal EYES: conjunctiva normal, EOM normal and PERRL NECK: supple, trachea normal and no thyromegaly or cervical LAD Normal ROM neck Vague non-discrete fullness right neck. Smaller compared to prior examination.    Data Review/Consults/Discussions: WBC normal Clinically improving from infection FNA right neck mass demonstrated malignant cells   Assessment: Glen Finley 85 y.o. male who presents with right neck mass with superimposed retropharyngeal fluid, clinically improving from infection standpoint. However, significant concern for etiology of malignant cells.    Plan:  -Recommend IR- guided core needle biopsies of right neck mass to obtain more tissue for further pathologic examination given somewhat unique location of tumor (question lymph node vs thyroid involvement). If core biopsy would prove to be insufficient for diagnosis, would repeat CT neck and consider for incisional biopsy in the future.    Thank you for involving [facility] in the care of this patient. Should you need further assistance,  please call our office at [phone].    Gavin Pound, MD

## 2019-07-07 NOTE — Progress Notes (Signed)
Physical Therapy Treatment Patient Details Name: Glen Finley MRN: [id number] DOB: March 28, 1934 Today's Date: 07/07/2019    History of Present Illness Nirav Sweda Tash is an 85 y.o. 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  He was recently admitted to the hospital and discharged 06/30/19 after being treated for acute hepatic encephalopathy.    PT Comments    Pt making steady progress. Still requires assist for all mobility and will currently be a lot to manage for wife. Does not want SNF at this point. I would like for CIR to look at to see if he would be a candidate for short CIR stay before return home.    Follow Up Recommendations  CIR;Supervision/Assistance - 24 hour     Equipment Recommendations  Wheelchair (measurements PT)    Recommendations for Other Services       Precautions / Restrictions Precautions Precautions: Fall Restrictions Weight Bearing Restrictions: No    Mobility  Bed Mobility Overal bed mobility: Needs Assistance Bed Mobility: Supine to Sit     Supine to sit: Min assist;HOB elevated     General bed mobility comments: Assist to bring hips to EOB  Transfers Overall transfer level: Needs assistance Equipment used: Rolling walker (2 wheeled) Transfers: Sit to/from Stand Sit to Stand: Min assist         General transfer comment: Assist to bring hips up and for balance. Verbal cues for hand placement.   Ambulation/Gait Ambulation/Gait assistance: Min assist;Mod assist Gait Distance (Feet): 80 Feet(80' x 2) Assistive device: Rolling walker (2 wheeled) Gait Pattern/deviations: Step-to pattern;Decreased step length - right;Decreased step length - left;Shuffle;Trunk flexed Gait  velocity: decr Gait velocity interpretation: <1.31 ft/sec, indicative of household ambulator General Gait Details: Assist for balance and support. Pt with rt lateral trunk lean and veers rt with walker which wife reports is typical for him. Pt requires mod assist for first several feet before improving and requiring min assist.    Stairs             Wheelchair Mobility    Modified Rankin (Stroke Patients Only)       Balance Overall balance assessment: Needs assistance Sitting-balance support: Feet supported;Bilateral upper extremity supported Sitting balance-Leahy Scale: Poor Sitting balance - Comments: UE support  Postural control: Right lateral lean Standing balance support: Bilateral upper extremity supported;During functional activity Standing balance-Leahy Scale: Poor Standing balance comment: walker and min assist for static standing                            Cognition Arousal/Alertness: Awake/alert Behavior During Therapy: WFL for tasks assessed/performed Overall Cognitive Status: History of cognitive impairments - at baseline                                        Exercises      General Comments General comments (skin integrity, edema, etc.): VSS      Pertinent Vitals/Pain Pain Assessment: No/denies pain    Home Living                      Prior Function  PT Goals (current goals can now be found in the care plan section) Acute Rehab PT Goals Patient Stated Goal: to go home Progress towards PT goals: Progressing toward goals    Frequency    Min 3X/week      PT Plan Discharge plan needs to be updated    Co-evaluation              AM-PAC PT "6 Clicks" Mobility   Outcome Measure  Help needed turning from your back to your side while in a flat bed without using bedrails?: A Little Help needed moving from lying on your back to sitting on the side of a flat bed without using bedrails?: A  Little Help needed moving to and from a bed to a chair (including a wheelchair)?: A Little Help needed standing up from a chair using your arms (e.g., wheelchair or bedside chair)?: A Little Help needed to walk in hospital room?: A Lot Help needed climbing 3-5 steps with a railing? : A Lot 6 Click Score: 16    End of Session Equipment Utilized During Treatment: Gait belt Activity Tolerance: Patient tolerated treatment well Patient left: with call bell/phone within reach;with family/visitor present;in chair;with chair alarm set Nurse Communication: Mobility status PT Visit Diagnosis: Unsteadiness on feet (R26.81);Muscle weakness (generalized) (M62.81)     Time: 4315-4008 PT Time Calculation (min) (ACUTE ONLY): 25 min  Charges:  $Gait Training: 23-37 mins                     [place] Pager [phone] Office [place] 07/07/2019, 12:30 PM

## 2019-07-07 NOTE — Progress Notes (Signed)
Patient is confused this am made several attempts to get OOB unsupervised statting that he needs to get home to help with his children and that his wife is sitting in the chair attempts made to reorient patient. Telephone to wife to get Andy's phone number because patient stated that if he could speak with Jonni Sanger he would not be so anxious. Jonni Sanger called and I gave him update that his grandfather was confused and anxious wanted to speak with him. Jonni Sanger number was added to the contact list to be notified if needed. Arthor Captain LPN

## 2019-07-07 NOTE — Progress Notes (Addendum)
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  He was started on broad-spectrum antibiotics.  Later in the course, he was diagnosed with retropharyngeal and right neck abscess.    MRI cervical spine on 4/11 revealed abnormal retropharyngeal/prevertebral edema with associated phlegmonous change within the lower right neck concerning for acute infection with probable superimposed 2 cm abscess.  CT soft tissue neck with contrast on 4/12 redemonstrated abnormal prevertebral/retropharyngeal fluid or edema extending from the C1-C2 level inferiorly to the C7 level. Extensive inflammatory stranding and phlegmonous change extending into the right neck soft tissues centered at the C5 level. Findings suspicious for an associated 3.0 x 2.0 abscess along the right posterior aspect of the thyroid cartilage at the C5 level. Heterogeneous enhancement of the right thyroid lobe suspicious for secondary thyroiditis  ENT, ID and IR following.  Patient had US guided FNA of right neck mass by IR on 4/13.  Culture with rare GPC.  Blood cultures negative so far.  He was on vancomycin and ceftriaxone, and transitioned to Unasyn on 4/14 .  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  Plan for repeat biopsy.   Subjective: Seen and examined earlier this morning.  No major events overnight of this morning.  Patient's wife at bedside.  She thinks he is confused this morning.  Patient has no complaints.  He is oriented x4.  Objective: Vitals:   07/06/19 1544 07/06/19 2003 07/07/19 0337 07/07/19 1132  BP:  139/66 (!) 169/74 (!) 155/69 (!) 157/85  Pulse: 69 73 79 85  Resp: 19 20 18 18   Temp: 98.6 F (37 C) 98.4 F (36.9 C) 98.2 F (36.8 C) 98.8 F (37.1 C)  TempSrc: Oral Oral Oral Oral  SpO2: 95% 97% 95% 93%  Weight:   92.8 kg     Intake/Output Summary (Last 24 hours) at 07/07/2019 1230 Last data filed at 07/07/2019 0700 Gross per 24 hour  Intake 692.76 ml  Output --  Net 692.76 ml   Filed Weights   07/05/19 0439 07/06/19 0334 07/07/19 0337  Weight: 91.8 kg 91.5 kg 92.8 kg    Examination:  GENERAL: No apparent distress.  Nontoxic. HEENT: MMM.  Vision and hearing grossly intact.  NECK: Supple.  No apparent JVD.  FROM.  No palpable LAD or mass.  Nontender. RESP: On room air.  No IWOB.  Fair aeration bilaterally. CVS:  RRR. Heart sounds normal.  ABD/GI/GU: Bowel sounds present. Soft. Non tender.  MSK/EXT:  Moves extremities. No apparent deformity. No edema.  SKIN: Diffuse erythematous macules over BLE.  Skin erythema over medial aspect of right forearm.  No increased warmth to touch or signs of infection. NEURO: Awake, alert and oriented x4.  No apparent focal neuro deficit. PSYCH: Calm. Normal affect.  Procedures:  4/13- US guided FNA of right neck  Microbiology summarized: 4/10-COVID-19 PCR negative. 4/10-blood cultures NGTD so far 4/13-right neck tissue culture with GPC  Assessment & Plan: Sepsis, secondary to retropharyngeal infection/right neck abscess-clinically improving. Blood cultures and MRSA PCR negative. MRI cervical spine w/wo contrast and CT soft tissue of neck with contrast as above.  Tissue cultures with staph epidermis.  Cytology with malignant cells -Vancomycin and ceftriaxone>>>Unasyn.  Will eventually switch to Augmentin for 3 weeks per ID  Right neck mass: Cytology from [place] FNA by IR on 4/13 with malignant cells. Discussed with oncology, Dr. Salem Senate who suggested ENT evaluation and repeat biopsy. [facility] Blenda [facility] E[facility] following, and recommended repeat  biopsy -IR consulted for biopsy.  Acute respiratory failure with hypoxia-likely due to the above.  Resolved.  On room air. -Encourage incentive spirometry -OOB  Acute hepatic encephalopathy, fatty liver disease, cirrhosis: Ammonia 92 on admit> 40.  Mental status improved.  Oriented x4 except for intermittent confusion. -Continue lactulose and titrate for 2-3 BM a day.  Chronic diastolic heart failure: Stable -Continue holding Lasix.  Myasthenia gravis, progressive gait disorder -Follow-up with neurology outpatient -PT/OT  Essential hypertension: Normotensive -Continue lisinopril, atenolol, hydralazine  Depression -Continue amitriptyline, lexapro   Thrombocytopenia: Likely due to cirrhosis.  Improving. -Continue monitoring                 DVT prophylaxis: SCD in the setting of thrombocytopenia Code Status: Full code Family Communication: Updated patient's wife at bedside.  Discharge barrier: Right neck mass evaluation-awaiting biopsy by IR Patient is from: Home Final disposition: Likely home in the next 24-48 hours after tissue biopsy for right neck mass  Consultants:  Infectious disease ENT Oncology (over the phone) IR   Sch Meds:  Scheduled Meds: . amitriptyline  25 mg Oral QHS  . atenolol  25 mg Oral q AM  . escitalopram  10 mg Oral q AM  . hydrALAZINE  10 mg Oral Q8H  . lactulose  20 g Oral TID  . lisinopril  20 mg Oral BID  . sodium chloride flush  10-40 mL Intracatheter Q12H   Continuous Infusions: . ampicillin-sulbactam (UNASYN) IV 3 g (07/07/19 0618)   PRN Meds:.hydrALAZINE, sodium chloride flush  Antimicrobials: Anti-infectives (From admission, onward)   Start     Dose/Rate Route Frequency Ordered Stop   07/07/19 0000  amoxicillin-clavulanate (AUGMENTIN) 875-125 MG tablet     1 tablet Oral 2 times daily 07/07/19 0940 07/28/19 2359   07/05/19 1300  Ampicillin-Sulbactam (UNASYN) 3 g in sodium chloride 0.9 % 100 mL IVPB     3 g 200  mL/hr over 30 Minutes Intravenous Every 6 hours 07/05/19 1226     07/04/19 2300  vancomycin (VANCOREADY) IVPB 1750 mg/350 mL  Status:  Discontinued     1,750 mg 175 mL/hr over 120 Minutes Intravenous Every 24 hours 07/04/19 0754 07/05/19 1226   07/03/19 1600  cefTRIAXone (ROCEPHIN) 2 g in sodium chloride 0.9 % 100 mL IVPB  Status:  Discontinued     2 g 200 mL/hr over 30 Minutes Intravenous Every 24 hours 07/03/19 1437 07/05/19 1226   07/02/19 2300  vancomycin (VANCOREADY) IVPB 1500 mg/300 mL  Status:  Discontinued     1,500 mg 150 mL/hr over 120 Minutes Intravenous Every 24 hours 07/01/19 2223 07/04/19 0754   07/02/19 0600  ceFEPIme (MAXIPIME) 2 g in sodium chloride 0.9 % 100 mL IVPB  Status:  Discontinued     2 g 200 mL/hr over 30 Minutes Intravenous Every 8 hours 07/01/19 2223 07/03/19 1437   07/02/19 0600  metroNIDAZOLE (FLAGYL) IVPB 500 mg  Status:  Discontinued     500 mg 100 mL/hr over 60 Minutes Intravenous Every 8 hours 07/01/19 2350 07/05/19 1226   07/01/19 2130  vancomycin (VANCOREADY) IVPB 1750 mg/350 mL     1,750  mg 175 mL/hr over 120 Minutes Intravenous  Once 07/01/19 2115 07/02/19 0235   07/01/19 2115  ceFEPIme (MAXIPIME) 2 g in sodium chloride 0.9 % 100 mL IVPB  Status:  Discontinued     2 g 200 mL/hr over 30 Minutes Intravenous Every 12 hours 07/01/19 2109 07/01/19 2223   07/01/19 2115  metroNIDAZOLE (FLAGYL) IVPB 500 mg     500 mg 100 mL/hr over 60 Minutes Intravenous  Once 07/01/19 2109 07/02/19 0025   07/01/19 2115  vancomycin (VANCOCIN) IVPB 1000 mg/200 mL premix  Status:  Discontinued     1,000 mg 200 mL/hr over 60 Minutes Intravenous  Once 07/01/19 2109 07/01/19 2115       I have personally reviewed the following labs and images: CBC: Recent Labs  Lab 07/01/19 2057 07/02/19 0640 07/03/19 0440 07/04/19 0248 07/05/19 0315  WBC 9.9 11.2* 9.0 8.3 7.9  NEUTROABS 6.7  --   --   --   --   HGB 14.8 13.0 13.0 12.9* 13.4  HCT 43.6 38.5* 38.8* 38.4* 39.5  MCV  98.9 98.7 99.2 99.0 99.7  PLT 94* 74* 73* 90* 104*   BMP &GFR Recent Labs  Lab 07/01/19 2057 07/03/19 0440 07/04/19 0248 07/05/19 0315  NA 137 138 138 138  K 4.0 3.9 3.6 4.2  CL 106 109 109 108  CO2 21* 22 20* 22  GLUCOSE 123* 92 98 89  BUN 22 21 17 17   CREATININE 1.02 0.98 0.82 0.83  CALCIUM 8.9 8.1* 7.8* 7.9*   Estimated Creatinine Clearance: 75.8 mL/min (by C-G formula based on SCr of 0.83 mg/dL). Liver & Pancreas: Recent Labs  Lab 07/01/19 2057 07/03/19 0440 07/04/19 0248 07/05/19 0315  AST 88* 49* 48* 45*  ALT 32 23 23 22   ALKPHOS 66 53 59 54  BILITOT 2.6* 3.7* 2.3* 2.2*  PROT 6.2* 5.7* 5.5* 5.4*  ALBUMIN 2.6* 2.2* 2.1* 2.2*   No results for input(s): LIPASE, AMYLASE in the last 168 hours. Recent Labs  Lab 07/01/19 2058 07/02/19 0640 07/07/19 0948  AMMONIA 92* 40* 40*   Diabetic: No results for input(s): HGBA1C in the last 72 hours. No results for input(s): GLUCAP in the last 168 hours. Cardiac Enzymes: No results for input(s): CKTOTAL, CKMB, CKMBINDEX, TROPONINI in the last 168 hours. No results for input(s): PROBNP in the last 8760 hours. Coagulation Profile: Recent Labs  Lab 07/01/19 2057  INR 1.5*   Thyroid Function Tests: No results for input(s): TSH, T4TOTAL, FREET4, T3FREE, THYROIDAB in the last 72 hours. Lipid Profile: No results for input(s): CHOL, HDL, LDLCALC, TRIG, CHOLHDL, LDLDIRECT in the last 72 hours. Anemia Panel: No results for input(s): VITAMINB12, FOLATE, FERRITIN, TIBC, IRON, RETICCTPCT in the last 72 hours. Urine analysis:    Component Value Date/Time   COLORURINE AMBER (A) 07/01/2019 2058   APPEARANCEUR CLEAR 07/01/2019 2058   LABSPEC 1.018 07/01/2019 2058   PHURINE 6.0 07/01/2019 2058   [place] NEGATIVE 07/01/2019 2058   HGBUR NEGATIVE 07/01/2019 2058   [place] NEGATIVE 07/01/2019 2058   [place] NEGATIVE 07/01/2019 2058   PROTEINUR NEGATIVE 07/01/2019 2058   UROBILINOGEN 1.0 08/10/2011 1826   NITRITE NEGATIVE  07/01/2019 2058   LEUKOCYTESUR NEGATIVE 07/01/2019 2058   Sepsis Labs: Invalid input(s): PROCALCITONIN, Early  Microbiology: Recent Results (from the past 240 hour(s))  SARS CORONAVIRUS 2 (TAT 6-24 HRS) Nasopharyngeal Nasopharyngeal Swab     Status: None   Collection Time: 06/28/19  7:07 PM   Specimen: Nasopharyngeal Swab  Result Value Ref Range  Status   SARS Coronavirus 2 NEGATIVE NEGATIVE Final    Comment: (NOTE) SARS-CoV-2 target nucleic acids are NOT DETECTED. The SARS-CoV-2 RNA is generally detectable in upper and lower respiratory specimens during the acute phase of infection. Negative results do not preclude SARS-CoV-2 infection, do not rule out co-infections with other pathogens, and should not be used as the sole basis for treatment or other patient management decisions. Negative results must be combined with clinical observations, patient history, and epidemiological information. The expected result is Negative. Fact Sheet for Patients: SugarRoll.be Fact Sheet for Healthcare Providers: https://www.woods-mathews.com/ This test is not yet approved or cleared by the [place] FDA and  has been authorized for detection and/or diagnosis of SARS-CoV-2 by FDA under an Emergency Use Authorization (EUA). This EUA will remain  in effect (meaning this test can be used) for the duration of the COVID-19 declaration under Section 56 4(b)(1) of the Act, 21 U.S.C. section 360bbb-3(b)(1), unless the authorization is terminated or revoked sooner. Performed at [facility], [place] [address] [place], [place] [address]   Culture, blood (Routine x 2)     Status: None   Collection Time: 07/01/19  8:48 PM   Specimen: BLOOD  Result Value Ref Range Status   Specimen Description BLOOD RIGHT FOREARM  Final   Special Requests   Final    BOTTLES DRAWN AEROBIC AND ANAEROBIC Blood Culture adequate volume   Culture   Final    NO GROWTH 5  DAYS Performed at [facility], [place] [address], [place], [place] [address]    Report Status 07/06/2019 FINAL  Final  Culture, blood (Routine x 2)     Status: None   Collection Time: 07/01/19  8:58 PM   Specimen: BLOOD  Result Value Ref Range Status   Specimen Description BLOOD LEFT FOREARM  Final   Special Requests   Final    BOTTLES DRAWN AEROBIC AND ANAEROBIC Blood Culture adequate volume   Culture   Final    NO GROWTH 5 DAYS Performed at [facility], [place] [address] [place], [place] [address]    Report Status 07/06/2019 FINAL  Final  SARS CORONAVIRUS 2 (TAT 6-24 HRS) Nasopharyngeal Nasopharyngeal Swab     Status: None   Collection Time: 07/01/19  9:51 PM   Specimen: Nasopharyngeal Swab  Result Value Ref Range Status   SARS Coronavirus 2 NEGATIVE NEGATIVE Final    Comment: (NOTE) SARS-CoV-2 target nucleic acids are NOT DETECTED. The SARS-CoV-2 RNA is generally detectable in upper and lower respiratory specimens during the acute phase of infection. Negative results do not preclude SARS-CoV-2 infection, do not rule out co-infections with other pathogens, and should not be used as the sole basis for treatment or other patient management decisions. Negative results must be combined with clinical observations, patient history, and epidemiological information. The expected result is Negative. Fact Sheet for Patients: SugarRoll.be Fact Sheet for Healthcare Providers: https://www.woods-mathews.com/ This test is not yet approved or cleared by the [place] FDA and  has been authorized for detection and/or diagnosis of SARS-CoV-2 by FDA under an Emergency Use Authorization (EUA). This EUA will remain  in effect (meaning this test can be used) for the duration of the COVID-19 declaration under Section 56 4(b)(1) of the Act, 21 U.S.C. section 360bbb-3(b)(1), unless the authorization is terminated or revoked sooner. Performed at  [facility], [place] [address] [place], [place] [address]   Body fluid culture     Status: Abnormal (  Preliminary result)   Collection Time: 07/04/19  2:20 PM   Specimen: Neck; Body Fluid  Result Value Ref Range Status   Specimen Description NECK  Final   Special Requests NONE  Final   Gram Stain   Final    RARE WBC PRESENT,BOTH PMN AND MONONUCLEAR RARE GRAM POSITIVE COCCI Performed at [facility], [place] [address], [place], [place] [address]    Culture STAPHYLOCOCCUS EPIDERMIDIS (A)  Final   Report Status PENDING  Incomplete  MRSA PCR Screening     Status: None   Collection Time: 07/05/19 11:18 AM   Specimen: Nasal Mucosa; Nasopharyngeal  Result Value Ref Range Status   MRSA by PCR NEGATIVE NEGATIVE Final    Comment:        The GeneXpert MRSA Assay (FDA approved for NASAL specimens only), is one component of a comprehensive MRSA colonization surveillance program. It is not intended to diagnose MRSA infection nor to guide or monitor treatment for MRSA infections. Performed at [facility], [place] [address] [place],  [address]     Radiology Studies: No results found.   Kassia Demarinis T. [place]  If 7PM-7AM, please contact night-coverage [email or web address] Password [facility] 07/07/2019, 12:30 PM

## 2019-07-08 LAB — RENAL FUNCTION PANEL
Albumin: 2.2 g/dL — ABNORMAL LOW (ref 3.5–5.0)
Anion gap: 10 (ref 5–15)
BUN: 17 mg/dL (ref 8–23)
CO2: 25 mmol/L (ref 22–32)
Calcium: 8.4 mg/dL — ABNORMAL LOW (ref 8.9–10.3)
Chloride: 109 mmol/L (ref 98–111)
Creatinine, Ser: 0.97 mg/dL (ref 0.61–1.24)
GFR calc Af Amer: >60 mL/min (ref >60)
GFR calc non Af Amer: >60 mL/min (ref >60)
Glucose, Bld: 94 mg/dL (ref 70–99)
Phosphorus: 2.4 mg/dL — ABNORMAL LOW (ref 2.5–4.6)
Potassium: 3.5 mmol/L (ref 3.5–5.1)
Sodium: 144 mmol/L (ref 135–145)

## 2019-07-08 LAB — BODY FLUID CULTURE

## 2019-07-08 LAB — CBC
HCT: 37.4 % — ABNORMAL LOW (ref 39.0–52.0)
Hemoglobin: 12.6 g/dL — ABNORMAL LOW (ref 13.0–17.0)
MCH: 33.9 pg (ref 26.0–34.0)
MCHC: 33.7 g/dL (ref 30.0–36.0)
MCV: 100.5 fL — ABNORMAL HIGH (ref 80.0–100.0)
Platelets: 112 10*3/uL — ABNORMAL LOW (ref 150–400)
RBC: 3.72 MIL/uL — ABNORMAL LOW (ref 4.22–5.81)
RDW: 15.8 % — ABNORMAL HIGH (ref 11.5–15.5)
WBC: 5.5 10*3/uL (ref 4.0–10.5)
nRBC: 0 % (ref 0.0–0.2)

## 2019-07-08 LAB — MAGNESIUM: Magnesium: 1.7 mg/dL (ref 1.7–2.4)

## 2019-07-08 NOTE — Progress Notes (Signed)
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  He was started on broad-spectrum antibiotics.  Later in the course, he was diagnosed with retropharyngeal and right neck abscess.    MRI cervical spine on 4/11 revealed abnormal retropharyngeal/prevertebral edema with associated phlegmonous change within the lower right neck concerning for acute infection with probable superimposed 2 cm abscess.  CT soft tissue neck with contrast on 4/12 redemonstrated abnormal prevertebral/retropharyngeal fluid or edema extending from the C1-C2 level inferiorly to the C7 level. Extensive inflammatory stranding and phlegmonous change extending into the right neck soft tissues centered at the C5 level. Findings suspicious for an associated 3.0 x 2.0 abscess along the right posterior aspect of the thyroid cartilage at the C5 level. Heterogeneous enhancement of the right thyroid lobe suspicious for secondary thyroiditis  ENT, ID and IR following.  Patient had US guided FNA of right neck mass by IR on 4/13.  Culture with rare GPC.  Blood cultures negative so far.  He was on vancomycin and ceftriaxone, and transitioned to Unasyn on 4/14 .  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  Plan for repeat biopsy by IR on 4/19.  Subjective: Seen and examined earlier this morning.  No major events overnight of this morning.  Somewhat sleepy today.  Oriented to self.  Follows some commands.  No apparent distress.  Objective: Vitals:   07/07/19 2009 07/08/19 0425 07/08/19 0500 07/08/19 0804  BP: (!) 152/70 (!)  148/55  (!) 141/56  Pulse: 82 77  74  Resp: (!) 24 20  (!) 23  Temp: 98.5 F (36.9 C) 98.5 F (36.9 C)  98.6 F (37 C)  TempSrc: Oral Oral  Oral  SpO2: 96% 98%  95%  Weight:   91.1 kg     Intake/Output Summary (Last 24 hours) at 07/08/2019 1233 Last data filed at 07/08/2019 0800 Gross per 24 hour  Intake 120 ml  Output 700 ml  Net -580 ml   Filed Weights   07/06/19 0334 07/07/19 0337 07/08/19 0500  Weight: 91.5 kg 92.8 kg 91.1 kg    Examination:  GENERAL: Sleepy but wakes to voice easily.  No apparent distress.  Nontoxic. HEENT: MMM.  Vision and hearing grossly intact.  NECK: Supple.  No apparent JVD.  RESP:  No IWOB. Good air movement bilaterally. CVS:  RRR. Heart sounds normal.  ABD/GI/GU: Bowel sounds present. Soft. Non tender.  MSK/EXT:  Moves extremities. No apparent deformity. No edema.  SKIN: Diffuse erythematous macules over BLE. NEURO: Sleepy but wakes to voice easily.  Oriented to self.  Follows commands.  No apparent focal neuro deficit. PSYCH: Calm. Normal affect.   Procedures:  4/13- US guided FNA of right neck  Microbiology summarized: 4/10-COVID-19 PCR negative. 4/10-blood cultures NGTD so far 4/13-right neck tissue culture with GPC  Assessment & Plan: Sepsis, secondary to retropharyngeal infection/right neck abscess-clinically improving. Blood cultures and MRSA PCR negative. MRI cervical spine w/wo contrast and CT soft tissue of neck with contrast as above.   Tissue cultures with staph epidermis.  Cytology with malignant cells -Vancomycin and ceftriaxone>>>Unasyn.  Will eventually switch to Augmentin  for 3 weeks per ID  Right neck mass: Cytology from [place] FNA by IR on 4/13 with malignant cells. Discussed with oncology, Dr. [place] Senate who suggested ENT evaluation and repeat biopsy. Dr. Blenda Nicely from [facility] ENT following, and recommended repeat biopsy -Plan for image guided right neck mass biopsy by IR on 4/19  Acute respiratory failure with hypoxia-likely due  to the above.  Resolved.  On room air. -Encourage incentive spirometry -OOB  Acute hepatic/metabolic encephalopathy, fatty liver disease, cirrhosis: Ammonia 92 on admit> 40.  Sleepy this morning.  He was started on Seroquel yesterday.  Could also be delirium. -Increase lactulose to 30 mg 3 times daily and titrate for 2-3 BM a day. -Discontinue Seroquel. -Frequent orientation delirium precautions.  Chronic diastolic heart failure: Stable -Continue holding Lasix.  Myasthenia gravis, progressive gait disorder -Follow-up with neurology outpatient -PT/OT  Essential hypertension: Normotensive -Continue lisinopril, atenolol, hydralazine  Depression -Continue amitriptyline, lexapro   Thrombocytopenia: Likely due to cirrhosis.  Improving. -Continue monitoring                 DVT prophylaxis: SCD in the setting of thrombocytopenia Code Status: Full code Family Communication: Updated patient's wife at bedside on 4/16.  Discharge barrier: Right neck mass evaluation-awaiting biopsy by IR Patient is from: Home Final disposition: Likely CIR after biopsy on 4/19.  Consultants:  Infectious disease ENT Oncology (over the phone) IR   Sch Meds:  Scheduled Meds: . amitriptyline  25 mg Oral QHS  . atenolol  25 mg Oral q AM  . escitalopram  10 mg Oral q AM  . hydrALAZINE  10 mg Oral Q8H  . lactulose  30 g Oral TID  . lisinopril  20 mg Oral BID  . sodium chloride flush  10-40 mL Intracatheter Q12H   Continuous Infusions: . ampicillin-sulbactam (UNASYN) IV 3 g (07/08/19 0616)   PRN Meds:.hydrALAZINE, sodium chloride flush  Antimicrobials: Anti-infectives (From admission, onward)   Start     Dose/Rate Route Frequency Ordered Stop   07/07/19 0000  amoxicillin-clavulanate (AUGMENTIN) 875-125 MG tablet     1 tablet Oral 2 times daily 07/07/19 0940 07/28/19 2359   07/05/19 1300  Ampicillin-Sulbactam (UNASYN) 3 g in sodium chloride 0.9 % 100 mL IVPB     3 g 200 mL/hr  over 30 Minutes Intravenous Every 6 hours 07/05/19 1226     07/04/19 2300  vancomycin (VANCOREADY) IVPB 1750 mg/350 mL  Status:  Discontinued     1,750 mg 175 mL/hr over 120 Minutes Intravenous Every 24 hours 07/04/19 0754 07/05/19 1226   07/03/19 1600  cefTRIAXone (ROCEPHIN) 2 g in sodium chloride 0.9 % 100 mL IVPB  Status:  Discontinued     2 g 200 mL/hr over 30 Minutes Intravenous Every 24 hours 07/03/19 1437 07/05/19 1226   07/02/19 2300  vancomycin (VANCOREADY) IVPB 1500 mg/300 mL  Status:  Discontinued     1,500 mg 150 mL/hr over 120 Minutes Intravenous Every 24 hours 07/01/19 2223 07/04/19 0754   07/02/19 0600  ceFEPIme (MAXIPIME) 2 g in sodium chloride 0.9 % 100 mL IVPB  Status:  Discontinued     2 g 200 mL/hr over 30 Minutes Intravenous Every 8 hours 07/01/19 2223 07/03/19 1437   07/02/19 0600  metroNIDAZOLE (FLAGYL) IVPB 500 mg  Status:  Discontinued     500 mg 100 mL/hr over 60 Minutes Intravenous Every 8 hours 07/01/19 2350 07/05/19 1226   07/01/19 2130  vancomycin (VANCOREADY) IVPB 1750 mg/350 mL     1,750  mg 175 mL/hr over 120 Minutes Intravenous  Once 07/01/19 2115 07/02/19 0235   07/01/19 2115  ceFEPIme (MAXIPIME) 2 g in sodium chloride 0.9 % 100 mL IVPB  Status:  Discontinued     2 g 200 mL/hr over 30 Minutes Intravenous Every 12 hours 07/01/19 2109 07/01/19 2223   07/01/19 2115  metroNIDAZOLE (FLAGYL) IVPB 500 mg     500 mg 100 mL/hr over 60 Minutes Intravenous  Once 07/01/19 2109 07/02/19 0025   07/01/19 2115  vancomycin (VANCOCIN) IVPB 1000 mg/200 mL premix  Status:  Discontinued     1,000 mg 200 mL/hr over 60 Minutes Intravenous  Once 07/01/19 2109 07/01/19 2115       I have personally reviewed the following labs and images: CBC: Recent Labs  Lab 07/01/19 2057 07/01/19 2057 07/02/19 0640 07/03/19 0440 07/04/19 0248 07/05/19 0315 07/08/19 0810  WBC 9.9   < > 11.2* 9.0 8.3 7.9 5.5  NEUTROABS 6.7  --   --   --   --   --   --   HGB 14.8   < > 13.0 13.0  12.9* 13.4 12.6*  HCT 43.6   < > 38.5* 38.8* 38.4* 39.5 37.4*  MCV 98.9   < > 98.7 99.2 99.0 99.7 100.5*  PLT 94*   < > 74* 73* 90* 104* 112*   < > = values in this interval not displayed.   BMP &GFR Recent Labs  Lab 07/01/19 2057 07/03/19 0440 07/04/19 0248 07/05/19 0315 07/08/19 0810  NA 137 138 138 138 144  K 4.0 3.9 3.6 4.2 3.5  CL 106 109 109 108 109  CO2 21* 22 20* 22 25  GLUCOSE 123* 92 98 89 94  BUN 22 21 17 17 17   CREATININE 1.02 0.98 0.82 0.83 0.97  CALCIUM 8.9 8.1* 7.8* 7.9* 8.4*  MG  --   --   --   --  1.7  PHOS  --   --   --   --  2.4*   Estimated Creatinine Clearance: 64.4 mL/min (by C-G formula based on SCr of 0.97 mg/dL). Liver & Pancreas: Recent Labs  Lab 07/01/19 2057 07/03/19 0440 07/04/19 0248 07/05/19 0315 07/08/19 0810  AST 88* 49* 48* 45*  --   ALT 32 23 23 22   --   ALKPHOS 66 53 59 54  --   BILITOT 2.6* 3.7* 2.3* 2.2*  --   PROT 6.2* 5.7* 5.5* 5.4*  --   ALBUMIN 2.6* 2.2* 2.1* 2.2* 2.2*   No results for input(s): LIPASE, AMYLASE in the last 168 hours. Recent Labs  Lab 07/01/19 2058 07/02/19 0640 07/07/19 0948  AMMONIA 92* 40* 40*   Diabetic: No results for input(s): HGBA1C in the last 72 hours. No results for input(s): GLUCAP in the last 168 hours. Cardiac Enzymes: No results for input(s): CKTOTAL, CKMB, CKMBINDEX, TROPONINI in the last 168 hours. No results for input(s): PROBNP in the last 8760 hours. Coagulation Profile: Recent Labs  Lab 07/01/19 2057  INR 1.5*   Thyroid Function Tests: No results for input(s): TSH, T4TOTAL, FREET4, T3FREE, THYROIDAB in the last 72 hours. Lipid Profile: No results for input(s): CHOL, HDL, LDLCALC, TRIG, CHOLHDL, LDLDIRECT in the last 72 hours. Anemia Panel: No results for input(s): VITAMINB12, FOLATE, FERRITIN, TIBC, IRON, RETICCTPCT in the last 72 hours. Urine analysis:    Component Value Date/Time   COLORURINE AMBER (A) 07/01/2019 2058   APPEARANCEUR CLEAR 07/01/2019 2058   LABSPEC  1.018 07/01/2019 2058  [place] 6.0 07/01/2019 2058   [place] NEGATIVE 07/01/2019 2058   HGBUR NEGATIVE 07/01/2019 2058   [place] NEGATIVE 07/01/2019 2058   [place] NEGATIVE 07/01/2019 2058   PROTEINUR NEGATIVE 07/01/2019 2058   UROBILINOGEN 1.0 08/10/2011 1826   NITRITE NEGATIVE 07/01/2019 2058   LEUKOCYTESUR NEGATIVE 07/01/2019 2058   Sepsis Labs: Invalid input(s): PROCALCITONIN, [place]  Microbiology: Recent Results (from the past 240 hour(s))  SARS CORONAVIRUS 2 (TAT 6-24 HRS) Nasopharyngeal Nasopharyngeal Swab     Status: None   Collection Time: 06/28/19  7:07 PM   Specimen: Nasopharyngeal Swab  Result Value Ref Range Status   SARS Coronavirus 2 NEGATIVE NEGATIVE Final    Comment: (NOTE) SARS-CoV-2 target nucleic acids are NOT DETECTED. The SARS-CoV-2 RNA is generally detectable in upper and lower respiratory specimens during the acute phase of infection. Negative results do not preclude SARS-CoV-2 infection, do not rule out co-infections with other pathogens, and should not be used as the sole basis for treatment or other patient management decisions. Negative results must be combined with clinical observations, patient history, and epidemiological information. The expected result is Negative. Fact Sheet for Patients: SugarRoll.be Fact Sheet for Healthcare Providers: https://www.woods-mathews.com/ This test is not yet approved or cleared by the [place] FDA and  has been authorized for detection and/or diagnosis of SARS-CoV-2 by FDA under an Emergency Use Authorization (EUA). This EUA will remain  in effect (meaning this test can be used) for the duration of the COVID-19 declaration under Section 56 4(b)(1) of the Act, 21 U.S.C. section 360bbb-3(b)(1), unless the authorization is terminated or revoked sooner. Performed at [facility], [place] [address], [place], [place] [address]   Culture, blood (Routine x  2)     Status: None   Collection Time: 07/01/19  8:48 PM   Specimen: BLOOD  Result Value Ref Range Status   Specimen Description BLOOD RIGHT FOREARM  Final   Special Requests   Final    BOTTLES DRAWN AEROBIC AND ANAEROBIC Blood Culture adequate volume   Culture   Final    NO GROWTH 5 DAYS Performed at [facility], [place] [address], [place], [place] [address]    Report Status 07/06/2019 FINAL  Final  Culture, blood (Routine x 2)     Status: None   Collection Time: 07/01/19  8:58 PM   Specimen: BLOOD  Result Value Ref Range Status   Specimen Description BLOOD LEFT FOREARM  Final   Special Requests   Final    BOTTLES DRAWN AEROBIC AND ANAEROBIC Blood Culture adequate volume   Culture   Final    NO GROWTH 5 DAYS Performed at [facility], [place] [address], [place], [place] [address]    Report Status 07/06/2019 FINAL  Final  SARS CORONAVIRUS 2 (TAT 6-24 HRS) Nasopharyngeal Nasopharyngeal Swab     Status: None   Collection Time: 07/01/19  9:51 PM   Specimen: Nasopharyngeal Swab  Result Value Ref Range Status   SARS Coronavirus 2 NEGATIVE NEGATIVE Final    Comment: (NOTE) SARS-CoV-2 target nucleic acids are NOT DETECTED. The SARS-CoV-2 RNA is generally detectable in upper and lower respiratory specimens during the acute phase of infection. Negative results do not preclude SARS-CoV-2 infection, do not rule out co-infections with other pathogens, and should not be used as the sole basis for treatment or other patient management decisions. Negative results must be combined with clinical observations, patient history, and epidemiological information. The expected result is Negative. Fact Sheet for Patients:  SugarRoll.be Fact Sheet for Healthcare Providers: https://www.woods-mathews.com/ This test is not yet approved or cleared by the [place] FDA and  has been authorized for detection and/or diagnosis of SARS-CoV-2 by FDA under  an Emergency Use Authorization (EUA). This EUA will remain  in effect (meaning this test can be used) for the duration of the COVID-19 declaration under Section 56 4(b)(1) of the Act, 21 U.S.C. section 360bbb-3(b)(1), unless the authorization is terminated or revoked sooner. Performed at [facility], [place] [address], [place], [place] [address]   Body fluid culture     Status: None   Collection Time: 07/04/19  2:20 PM   Specimen: Neck; Body Fluid  Result Value Ref Range Status   Specimen Description NECK  Final   Special Requests NONE  Final   Gram Stain   Final    RARE WBC PRESENT,BOTH PMN AND MONONUCLEAR RARE GRAM POSITIVE COCCI Performed at [facility], [place] [address] [place], [place] [address]    Culture RARE STAPHYLOCOCCUS EPIDERMIDIS  Final   Report Status 07/08/2019 FINAL  Final   Organism ID, Bacteria STAPHYLOCOCCUS EPIDERMIDIS  Final      Susceptibility   Staphylococcus epidermidis - MIC*    CIPROFLOXACIN <=0.5 SENSITIVE Sensitive     ERYTHROMYCIN <=0.25 SENSITIVE Sensitive     GENTAMICIN <=0.5 SENSITIVE Sensitive     OXACILLIN <=0.25 SENSITIVE Sensitive     TETRACYCLINE <=1 SENSITIVE Sensitive     VANCOMYCIN 2 SENSITIVE Sensitive     TRIMETH/SULFA <=10 SENSITIVE Sensitive     CLINDAMYCIN <=0.25 SENSITIVE Sensitive     RIFAMPIN <=0.5 SENSITIVE Sensitive     Inducible Clindamycin NEGATIVE Sensitive     * RARE STAPHYLOCOCCUS EPIDERMIDIS  MRSA PCR Screening     Status: None   Collection Time: 07/05/19 11:18 AM   Specimen: Nasal Mucosa; Nasopharyngeal  Result Value Ref Range Status   MRSA by PCR NEGATIVE NEGATIVE Final    Comment:        The GeneXpert MRSA Assay (FDA approved for NASAL specimens only), is one component of a comprehensive MRSA colonization surveillance program. It is not intended to diagnose MRSA infection nor to guide or monitor treatment for MRSA infections. Performed at [facility], [place] [address], [place], [place]  [address]     Radiology Studies: No results found.   Leidi Astle T. Jenner  If 7PM-7AM, please contact night-coverage [email or web address] Password [facility] 07/08/2019, 12:33 PM

## 2019-07-09 MED ORDER — HYDRALAZINE HCL 25 MG PO TABS
25.0000 mg | ORAL_TABLET | Freq: Three times a day (TID) | ORAL | Status: DC
  Administered 2019-07-09 – 2019-07-10 (×3): 25 mg via ORAL
  Filled 2019-07-09 (×3): qty 1

## 2019-07-09 NOTE — Plan of Care (Signed)
  Problem: Education: Goal: Knowledge of General Education information will improve Description: Including pain rating scale, medication(s)/side effects and non-pharmacologic comfort measures 07/09/2019 2031 by Venora Maples, RN Outcome: Not Progressing 07/09/2019 2031 by Venora Maples, RN Outcome: Not Progressing   Problem: Health Behavior/Discharge Planning: Goal: Ability to manage health-related needs will improve 07/09/2019 2031 by Venora Maples, RN Outcome: Not Progressing 07/09/2019 2031 by Venora Maples, RN Outcome: Not Progressing   Problem: Clinical Measurements: Goal: Ability to maintain clinical measurements within normal limits will improve 07/09/2019 2031 by Venora Maples, RN Outcome: Not Progressing 07/09/2019 2031 by Venora Maples, RN Outcome: Not Progressing Goal: Will remain free from infection 07/09/2019 2031 by Venora Maples, RN Outcome: Not Progressing 07/09/2019 2031 by Venora Maples, RN Outcome: Not Progressing Goal: Diagnostic test results will improve 07/09/2019 2031 by Venora Maples, RN Outcome: Not Progressing 07/09/2019 2031 by Venora Maples, RN Outcome: Not Progressing Goal: Respiratory complications will improve 07/09/2019 2031 by Venora Maples, RN Outcome: Not Progressing 07/09/2019 2031 by Venora Maples, RN Outcome: Not Progressing Goal: Cardiovascular complication will be avoided 07/09/2019 2031 by Venora Maples, RN Outcome: Not Progressing 07/09/2019 2031 by Venora Maples, RN Outcome: Not Progressing   Problem: Activity: Goal: Risk for activity intolerance will decrease 07/09/2019 2031 by Venora Maples, RN Outcome: Not Progressing 07/09/2019 2031 by Venora Maples, RN Outcome: Not Progressing   Problem: Nutrition: Goal: Adequate nutrition will be maintained 07/09/2019 2031 by Venora Maples, RN Outcome: Not Progressing 07/09/2019 2031 by Venora Maples, RN Outcome: Not Progressing   Problem:  Coping: Goal: Level of anxiety will decrease 07/09/2019 2031 by Venora Maples, RN Outcome: Not Progressing 07/09/2019 2031 by Venora Maples, RN Outcome: Not Progressing   Problem: Elimination: Goal: Will not experience complications related to bowel motility 07/09/2019 2031 by Venora Maples, RN Outcome: Not Progressing 07/09/2019 2031 by Venora Maples, RN Outcome: Not Progressing Goal: Will not experience complications related to urinary retention 07/09/2019 2031 by Venora Maples, RN Outcome: Not Progressing 07/09/2019 2031 by Venora Maples, RN Outcome: Not Progressing   Problem: Pain Managment: Goal: General experience of comfort will improve 07/09/2019 2031 by Venora Maples, RN Outcome: Not Progressing 07/09/2019 2031 by Venora Maples, RN Outcome: Not Progressing   Problem: Safety: Goal: Ability to remain free from injury will improve 07/09/2019 2031 by Venora Maples, RN Outcome: Not Progressing 07/09/2019 2031 by Venora Maples, RN Outcome: Not Progressing   Problem: Skin Integrity: Goal: Risk for impaired skin integrity will decrease 07/09/2019 2031 by Venora Maples, RN Outcome: Not Progressing 07/09/2019 2031 by Venora Maples, RN Outcome: Not Progressing

## 2019-07-09 NOTE — Progress Notes (Signed)
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  He was started on broad-spectrum antibiotics.  Later in the course, he was diagnosed with retropharyngeal and right neck abscess.    MRI cervical spine on 4/11 revealed abnormal retropharyngeal/prevertebral edema with associated phlegmonous change within the lower right neck concerning for acute infection with probable superimposed 2 cm abscess.  CT soft tissue neck with contrast on 4/12 redemonstrated abnormal prevertebral/retropharyngeal fluid or edema extending from the C1-C2 level inferiorly to the C7 level. Extensive inflammatory stranding and phlegmonous change extending into the right neck soft tissues centered at the C5 level. Findings suspicious for an associated 3.0 x 2.0 abscess along the right posterior aspect of the thyroid cartilage at the C5 level. Heterogeneous enhancement of the right thyroid lobe suspicious for secondary thyroiditis  ENT, ID and IR following.  Patient had US guided FNA of right neck mass by IR on 4/13.  Culture with rare GPC.  Blood cultures negative so far.  He was on vancomycin and ceftriaxone, and transitioned to Unasyn on 4/14 .  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  Plan for repeat biopsy by IR on 4/19.  Subjective: Seen and examined earlier this morning.  No major events overnight of this morning.  No complaint this morning.  He is oriented x4 except date.  He thinks the date is April 17.  Objective: Vitals:   07/09/19 0607 07/09/19 0610 07/09/19 0611 07/09/19 0842  BP: (!) 155/72   (!) 154/67 (!) 144/64  Pulse: 74 73 76 61  Resp: (!) 26 20 20  (!) 22  Temp:    99.1 F (37.3 C)  TempSrc:    Oral  SpO2: 93% 92% 92% 100%  Weight:        Intake/Output Summary (Last 24 hours) at 07/09/2019 1048 Last data filed at 07/09/2019 0853 Gross per 24 hour  Intake 880 ml  Output 400 ml  Net 480 ml   Filed Weights   07/06/19 0334 07/07/19 0337 07/08/19 0500  Weight: 91.5 kg 92.8 kg 91.1 kg    Examination:  GENERAL: No apparent distress.  Nontoxic. HEENT: MMM.  Vision and hearing grossly intact.  NECK: Supple.  No apparent JVD.  RESP: On room air.  No IWOB.  Fair aeration bilaterally. CVS:  RRR. Heart sounds normal.  ABD/GI/GU: Bowel sounds present. Soft. Non tender.  MSK/EXT:  Moves extremities. No apparent deformity. No edema.  SKIN: Diffuse erythematous macules over BLE NEURO: Awake, alert and oriented x4 except to date.  No apparent focal neuro deficit. PSYCH: Calm. Normal affect.  Procedures:  4/13- US guided FNA of right neck  Microbiology summarized: 4/10-COVID-19 PCR negative. 4/10-blood cultures NGTD so far 4/13-right neck tissue culture with GPC  Assessment & Plan: Sepsis, secondary to retropharyngeal infection/right neck abscess-clinically improving. Blood cultures and MRSA PCR negative. MRI cervical spine w/wo contrast and CT soft tissue of neck with contrast as above. Tissue cultures with staph epidermis.  Initial cytology with malignant cells. -Vancomycin and ceftriaxone>>>Unasyn.  Per ID, can stop antibiotics if repeat biopsy confirms malignancy.  Right neck mass: Cytology from [place] FNA by  IR on 4/13 with malignant cells. Discussed with oncology, Dr. Salem Senate who suggested ENT evaluation and repeat biopsy. Dr. Blenda Nicely from [facility] ENT following, and recommended repeat biopsy -Plan for image guided right neck mass biopsy by IR on 4/19  Acute respiratory failure with hypoxia-likely due to the above.  Resolved.  On room air. -Encourage incentive spirometry  and OOB  Acute hepatic/metabolic encephalopathy, fatty liver disease, cirrhosis: Ammonia 92 on admit> 40.  Improved.  He is oriented x4 except to date. -Continue lactulose to 30 mg 3 times daily and titrate for 2-3 BM a day. -Frequent orientation delirium precautions.  Chronic diastolic heart failure: Stable -Continue holding Lasix.  Myasthenia gravis, progressive gait disorder -Follow-up with neurology outpatient -PT/OT-recommended CIR.  CIR consulted.  Essential hypertension: Normotensive -Continue lisinopril, atenolol, hydralazine  Depression -Continue amitriptyline, lexapro   Thrombocytopenia: Likely due to cirrhosis.  Improving. -Continue monitoring                 DVT prophylaxis: SCD in the setting of thrombocytopenia Code Status: Full code Family Communication: None at baseline.  Updated patient's wife at bedside on 4/16.  Discharge barrier: Right neck mass evaluation-awaiting biopsy by IR, and CIR bed Patient is from: Home Final disposition: Likely CIR after biopsy on 4/19 when bed available.  Consultants:  Infectious disease ENT Oncology (over the phone) IR   Sch Meds:  Scheduled Meds: . amitriptyline  25 mg Oral QHS  . atenolol  25 mg Oral q AM  . escitalopram  10 mg Oral q AM  . hydrALAZINE  25 mg Oral Q8H  . lactulose  30 g Oral TID  . lisinopril  20 mg Oral BID  . sodium chloride flush  10-40 mL Intracatheter Q12H   Continuous Infusions: . ampicillin-sulbactam (UNASYN) IV 3 g (07/09/19 0607)   PRN Meds:.hydrALAZINE, sodium chloride flush  Antimicrobials: Anti-infectives (From admission, onward)   Start     Dose/Rate Route Frequency Ordered Stop   07/07/19 0000  amoxicillin-clavulanate (AUGMENTIN) 875-125 MG tablet     1 tablet Oral 2 times daily 07/07/19 0940 07/28/19 2359   07/05/19 1300  Ampicillin-Sulbactam (UNASYN) 3 g in sodium chloride 0.9 % 100 mL IVPB     3 g 200 mL/hr over 30 Minutes Intravenous Every 6 hours 07/05/19  1226     07/04/19 2300  vancomycin (VANCOREADY) IVPB 1750 mg/350 mL  Status:  Discontinued     1,750 mg 175 mL/hr over 120 Minutes Intravenous Every 24 hours 07/04/19 0754 07/05/19 1226   07/03/19 1600  cefTRIAXone (ROCEPHIN) 2 g in sodium chloride 0.9 % 100 mL IVPB  Status:  Discontinued     2 g 200 mL/hr over 30 Minutes Intravenous Every 24 hours 07/03/19 1437 07/05/19 1226   07/02/19 2300  vancomycin (VANCOREADY) IVPB 1500 mg/300 mL  Status:  Discontinued     1,500 mg 150 mL/hr over 120 Minutes Intravenous Every 24 hours 07/01/19 2223 07/04/19 0754   07/02/19 0600  ceFEPIme (MAXIPIME) 2 g in sodium chloride 0.9 % 100 mL IVPB  Status:  Discontinued     2 g 200 mL/hr over 30 Minutes Intravenous Every 8 hours 07/01/19 2223 07/03/19 1437   07/02/19 0600  metroNIDAZOLE (FLAGYL) IVPB 500 mg  Status:  Discontinued     500 mg 100 mL/hr over 60 Minutes Intravenous Every 8 hours 07/01/19 2350 07/05/19 1226   07/01/19 2130  vancomycin (VANCOREADY) IVPB 1750 mg/350 mL     1,750 mg 175 mL/hr over 120 Minutes Intravenous  Once 07/01/19 2115 07/02/19 0235   07/01/19 2115  ceFEPIme (MAXIPIME) 2 g in sodium chloride 0.9 % 100 mL IVPB  Status:  Discontinued     2 g 200 mL/hr over 30 Minutes Intravenous Every 12 hours 07/01/19 2109 07/01/19 2223   07/01/19 2115  metroNIDAZOLE (FLAGYL) IVPB 500 mg     500 mg 100 mL/hr over 60 Minutes Intravenous  Once 07/01/19 2109 07/02/19 0025   07/01/19 2115  vancomycin (VANCOCIN) IVPB 1000 mg/200 mL premix  Status:  Discontinued     1,000 mg 200 mL/hr over 60 Minutes Intravenous  Once 07/01/19 2109 07/01/19 2115       I have personally reviewed the following labs and images: CBC: Recent Labs  Lab 07/03/19 0440 07/04/19 0248 07/05/19 0315 07/08/19 0810  WBC 9.0 8.3 7.9 5.5  HGB 13.0 12.9* 13.4 12.6*  HCT 38.8* 38.4* 39.5 37.4*  MCV 99.2 99.0 99.7 100.5*  PLT 73* 90* 104* 112*   BMP &GFR Recent Labs  Lab 07/03/19 0440 07/04/19 0248 07/05/19 0315  07/08/19 0810  NA 138 138 138 144  K 3.9 3.6 4.2 3.5  CL 109 109 108 109  CO2 22 20* 22 25  GLUCOSE 92 98 89 94  BUN 21 17 17 17   CREATININE 0.98 0.82 0.83 0.97  CALCIUM 8.1* 7.8* 7.9* 8.4*  MG  --   --   --  1.7  PHOS  --   --   --  2.4*   Estimated Creatinine Clearance: 64.4 mL/min (by C-G formula based on SCr of 0.97 mg/dL). Liver & Pancreas: Recent Labs  Lab 07/03/19 0440 07/04/19 0248 07/05/19 0315 07/08/19 0810  AST 49* 48* 45*  --   ALT 23 23 22   --   ALKPHOS 53 59 54  --   BILITOT 3.7* 2.3* 2.2*  --   PROT 5.7* 5.5* 5.4*  --   ALBUMIN 2.2* 2.1* 2.2* 2.2*   No results for input(s): LIPASE, AMYLASE in the last 168 hours. Recent Labs  Lab 07/07/19 0948  AMMONIA 40*   Diabetic: No results for input(s): HGBA1C in the last 72 hours. No results for input(s): GLUCAP in the last 168 hours. Cardiac Enzymes: No results for input(s): CKTOTAL, CKMB, CKMBINDEX, TROPONINI in the last 168 hours. No results for input(s): PROBNP in the last 8760 hours. Coagulation Profile: No results for input(s): INR, PROTIME in the last 168 hours. Thyroid Function Tests: No results for input(s): TSH, T4TOTAL, FREET4, T3FREE, THYROIDAB in the last 72 hours. Lipid Profile: No results for input(s): CHOL, HDL, LDLCALC, TRIG, CHOLHDL, LDLDIRECT in the last 72 hours. Anemia Panel: No results for input(s): VITAMINB12, FOLATE, FERRITIN, TIBC, IRON, RETICCTPCT in the last 72 hours. Urine analysis:    Component Value Date/Time   COLORURINE AMBER (A) 07/01/2019 2058   APPEARANCEUR CLEAR 07/01/2019 2058   LABSPEC 1.018 07/01/2019 2058   PHURINE 6.0 07/01/2019 2058   [place] NEGATIVE 07/01/2019 2058   HGBUR NEGATIVE 07/01/2019 2058   [place] NEGATIVE 07/01/2019 2058   [place] NEGATIVE 07/01/2019 2058   PROTEINUR NEGATIVE 07/01/2019 2058   UROBILINOGEN 1.0 08/10/2011 1826   NITRITE NEGATIVE 07/01/2019 2058   LEUKOCYTESUR NEGATIVE 07/01/2019 2058   Sepsis Labs: Invalid input(s):  PROCALCITONIN, [place]  Microbiology: Recent Results (from the past 240 hour(s))  Culture, blood (Routine x 2)     Status: None   Collection Time: 07/01/19  8:48 PM   Specimen: BLOOD  Result Value Ref Range Status   Specimen Description BLOOD RIGHT  FOREARM  Final   Special Requests   Final    BOTTLES DRAWN AEROBIC AND ANAEROBIC Blood Culture adequate volume   Culture   Final    NO GROWTH 5 DAYS Performed at [facility], [address], [place], [place] [address]    Report Status 07/06/2019 FINAL  Final  Culture, blood (Routine x 2)     Status: None   Collection Time: 07/01/19  8:58 PM   Specimen: BLOOD  Result Value Ref Range Status   Specimen Description BLOOD LEFT FOREARM  Final   Special Requests   Final    BOTTLES DRAWN AEROBIC AND ANAEROBIC Blood Culture adequate volume   Culture   Final    NO GROWTH 5 DAYS Performed at [facility], [place] [address] [place], [place] [address]    Report Status 07/06/2019 FINAL  Final  SARS CORONAVIRUS 2 (TAT 6-24 HRS) Nasopharyngeal Nasopharyngeal Swab     Status: None   Collection Time: 07/01/19  9:51 PM   Specimen: Nasopharyngeal Swab  Result Value Ref Range Status   SARS Coronavirus 2 NEGATIVE NEGATIVE Final    Comment: (NOTE) SARS-CoV-2 target nucleic acids are NOT DETECTED. The SARS-CoV-2 RNA is generally detectable in upper and lower respiratory specimens during the acute phase of infection. Negative results do not preclude SARS-CoV-2 infection, do not rule out co-infections with other pathogens, and should not be used as the sole basis for treatment or other patient management decisions. Negative results must be combined with clinical observations, patient history, and epidemiological information. The expected result is Negative. Fact Sheet for Patients: SugarRoll.be Fact Sheet for Healthcare Providers: https://www.woods-mathews.com/ This test is not yet approved or  cleared by the [place] FDA and  has been authorized for detection and/or diagnosis of SARS-CoV-2 by FDA under an Emergency Use Authorization (EUA). This EUA will remain  in effect (meaning this test can be used) for the duration of the COVID-19 declaration under Section 56 4(b)(1) of the Act, 21 U.S.C. section 360bbb-3(b)(1), unless the authorization is terminated or revoked sooner. Performed at [facility], [place] [address] [place], [place] [address]   Body fluid culture     Status: None   Collection Time: 07/04/19  2:20 PM   Specimen: Neck; Body Fluid  Result Value Ref Range Status   Specimen Description NECK  Final   Special Requests NONE  Final   Gram Stain   Final    RARE WBC PRESENT,BOTH PMN AND MONONUCLEAR RARE GRAM POSITIVE COCCI Performed at [facility], [place] [address], [place], [place] [address]    Culture RARE STAPHYLOCOCCUS EPIDERMIDIS  Final   Report Status 07/08/2019 FINAL  Final   Organism ID, Bacteria STAPHYLOCOCCUS EPIDERMIDIS  Final      Susceptibility   Staphylococcus epidermidis - MIC*    CIPROFLOXACIN <=0.5 SENSITIVE Sensitive     ERYTHROMYCIN <=0.25 SENSITIVE Sensitive     GENTAMICIN <=0.5 SENSITIVE Sensitive     OXACILLIN <=0.25 SENSITIVE Sensitive     TETRACYCLINE <=1 SENSITIVE Sensitive     VANCOMYCIN 2 SENSITIVE Sensitive     TRIMETH/SULFA <=10 SENSITIVE Sensitive     CLINDAMYCIN <=0.25 SENSITIVE Sensitive     RIFAMPIN <=0.5 SENSITIVE Sensitive     Inducible Clindamycin NEGATIVE Sensitive     * RARE STAPHYLOCOCCUS EPIDERMIDIS  MRSA PCR Screening     Status: None   Collection Time: 07/05/19 11:18 AM   Specimen: Nasal Mucosa; Nasopharyngeal  Result Value Ref Range Status  MRSA by PCR NEGATIVE NEGATIVE Final    Comment:        The GeneXpert MRSA Assay (FDA approved for NASAL specimens only), is one component of a comprehensive MRSA colonization surveillance program. It is not intended to diagnose MRSA infection nor to guide  or monitor treatment for MRSA infections. Performed at [facility], [place] [address], [place], [place] [address]     Radiology Studies: No results found.   Ludia Gartland T. [place]  If 7PM-7AM, please contact night-coverage [email or web address] Password [facility] 07/09/2019, 10:48 AM

## 2019-07-10 ENCOUNTER — Inpatient Hospital Stay (HOSPITAL_COMMUNITY): Payer: Medicare Other

## 2019-07-10 ENCOUNTER — Other Ambulatory Visit: Payer: Self-pay | Admitting: Anatomic Pathology & Clinical Pathology

## 2019-07-10 LAB — BASIC METABOLIC PANEL
Anion gap: 10 (ref 5–15)
BUN: 15 mg/dL (ref 8–23)
CO2: 23 mmol/L (ref 22–32)
Calcium: 8.3 mg/dL — ABNORMAL LOW (ref 8.9–10.3)
Chloride: 111 mmol/L (ref 98–111)
Creatinine, Ser: 0.78 mg/dL (ref 0.61–1.24)
GFR calc Af Amer: >60 mL/min (ref >60)
GFR calc non Af Amer: >60 mL/min (ref >60)
Glucose, Bld: 87 mg/dL (ref 70–99)
Potassium: 3.6 mmol/L (ref 3.5–5.1)
Sodium: 144 mmol/L (ref 135–145)

## 2019-07-10 LAB — CBC
HCT: 37.2 % — ABNORMAL LOW (ref 39.0–52.0)
Hemoglobin: 12.6 g/dL — ABNORMAL LOW (ref 13.0–17.0)
MCH: 33.8 pg (ref 26.0–34.0)
MCHC: 33.9 g/dL (ref 30.0–36.0)
MCV: 99.7 fL (ref 80.0–100.0)
Platelets: 123 10*3/uL — ABNORMAL LOW (ref 150–400)
RBC: 3.73 MIL/uL — ABNORMAL LOW (ref 4.22–5.81)
RDW: 15.8 % — ABNORMAL HIGH (ref 11.5–15.5)
WBC: 6.6 10*3/uL (ref 4.0–10.5)
nRBC: 0 % (ref 0.0–0.2)

## 2019-07-10 LAB — PHOSPHORUS: Phosphorus: 1.9 mg/dL — ABNORMAL LOW (ref 2.5–4.6)

## 2019-07-10 LAB — MAGNESIUM: Magnesium: 1.5 mg/dL — ABNORMAL LOW (ref 1.7–2.4)

## 2019-07-10 MED ORDER — MIDAZOLAM HCL 2 MG/2ML IJ SOLN
INTRAMUSCULAR | Status: AC
  Filled 2019-07-10: qty 2

## 2019-07-10 MED ORDER — MAGNESIUM SULFATE 2 GM/50ML IV SOLN
2.0000 g | Freq: Once | INTRAVENOUS | Status: AC
  Administered 2019-07-10: 2 g via INTRAVENOUS
  Filled 2019-07-10: qty 50

## 2019-07-10 MED ORDER — MIDAZOLAM HCL 2 MG/2ML IJ SOLN
INTRAMUSCULAR | Status: AC | PRN
  Administered 2019-07-10: 0.5 mg via INTRAVENOUS

## 2019-07-10 MED ORDER — FENTANYL CITRATE (PF) 100 MCG/2ML IJ SOLN
INTRAMUSCULAR | Status: AC | PRN
  Administered 2019-07-10: 25 ug via INTRAVENOUS

## 2019-07-10 MED ORDER — LIDOCAINE-EPINEPHRINE 1 %-1:100000 IJ SOLN
INTRAMUSCULAR | Status: AC
  Filled 2019-07-10: qty 1

## 2019-07-10 MED ORDER — FENTANYL CITRATE (PF) 100 MCG/2ML IJ SOLN
INTRAMUSCULAR | Status: AC
  Filled 2019-07-10: qty 2

## 2019-07-10 MED ORDER — HYDRALAZINE HCL 50 MG PO TABS
50.0000 mg | ORAL_TABLET | Freq: Three times a day (TID) | ORAL | Status: DC
  Administered 2019-07-10 – 2019-07-17 (×22): 50 mg via ORAL
  Filled 2019-07-10 (×22): qty 1

## 2019-07-10 NOTE — Progress Notes (Signed)
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  He was started on broad-spectrum antibiotics.  Later in the course, he was diagnosed with retropharyngeal and right neck abscess.    MRI cervical spine on 4/11 revealed abnormal retropharyngeal/prevertebral edema with associated phlegmonous change within the lower right neck concerning for acute infection with probable superimposed 2 cm abscess.  CT soft tissue neck with contrast on 4/12 redemonstrated abnormal prevertebral/retropharyngeal fluid or edema extending from the C1-C2 level inferiorly to the C7 level. Extensive inflammatory stranding and phlegmonous change extending into the right neck soft tissues centered at the C5 level. Findings suspicious for an associated 3.0 x 2.0 abscess along the right posterior aspect of the thyroid cartilage at the C5 level. Heterogeneous enhancement of the right thyroid lobe suspicious for secondary thyroiditis  ENT, ID and IR following.  Patient had US guided FNA of right neck mass by IR on 4/13.  Culture with rare GPC.  Blood cultures negative so far.  He was on vancomycin and ceftriaxone, and transitioned to Unasyn on 4/14 .  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  Plan for repeat biopsy by IR on 4/19.  Subjective: Seen and examined earlier this morning.  No major events overnight or this morning.  No complaint this morning.  He denies chest pain, dyspnea, GI or UTI symptoms.  Awake and oriented x4.  Objective: Vitals:   07/09/19 2146 07/10/19 0402 07/10/19 0618 07/10/19 0908  BP:  (!) 167/66 (!) 159/82 (!) 159/66 (!) 152/70  Pulse: (!) 30 68 72   Resp: 19 20 (!) 23   Temp: 98.7 F (37.1 C) 99.3 F (37.4 C)    TempSrc: Oral Oral    SpO2: (!) 80% 94%    Weight:  93.2 kg      Intake/Output Summary (Last 24 hours) at 07/10/2019 1200 Last data filed at 07/10/2019 0522 Gross per 24 hour  Intake 240 ml  Output 260 ml  Net -20 ml   Filed Weights   07/07/19 0337 07/08/19 0500 07/10/19 0402  Weight: 92.8 kg 91.1 kg 93.2 kg    Examination:  GENERAL: No apparent distress.  Nontoxic. HEENT: MMM.  Vision and hearing grossly intact.  NECK: Supple.  No apparent JVD.  RESP: On room air.  No IWOB.  Fair aeration bilaterally. CVS:  RRR. Heart sounds normal.  ABD/GI/GU: Bowel sounds present. Soft. Non tender.  MSK/EXT:  Moves extremities. No apparent deformity. No edema.  SKIN: Diffuse erythematous macules over BLE-improved. NEURO: Awake, alert and oriented x4..  No apparent focal neuro deficit. PSYCH: Calm. Normal affect.  Procedures:  4/13- US guided FNA of right neck  Microbiology summarized: 4/10-COVID-19 PCR negative. 4/10-blood cultures NGTD so far 4/13-right neck tissue culture with staph epidermis.  Assessment & Plan: Sepsis, secondary to retropharyngeal infection/right neck abscess-clinically improving. Blood cultures and MRSA PCR negative. MRI cervical spine w/wo contrast and CT soft tissue of neck with contrast as above. Tissue cultures with staph epidermis.  Initial cytology with malignant cells. -Vancomycin and ceftriaxone>>>Unasyn.  Per ID, can stop antibiotics if repeat biopsy confirms malignancy.  Right neck mass: Cytology  from [place] FNA by IR on 4/13 with malignant cells. Discussed with oncology, Dr. Salem Senate who suggested ENT evaluation and repeat biopsy. Dr. Blenda Nicely from [facility] following, and recommended repeat biopsy -Plan for image guided right neck mass biopsy by IR likely today (4/19)  Acute respiratory failure with hypoxia-likely due to the  above.  Resolved.  On room air. -Encourage incentive spirometry and OOB  Acute hepatic/metabolic encephalopathy, fatty liver disease, cirrhosis: Ammonia 92 on admit> 40.  Improved.  He is oriented x4 except to date. -Continue lactulose to 30 mg 3 times daily and titrate for 2-3 BM a day. -Frequent orientation delirium precautions.  Chronic diastolic heart failure: Stable -Continue holding Lasix.  Myasthenia gravis, progressive gait disorder -Follow-up with neurology outpatient -PT/OT-recommended CIR.  CIR consulted.  Essential hypertension: Normotensive -Continue lisinopril, atenolol, hydralazine  Depression -Continue amitriptyline, lexapro   Thrombocytopenia: Likely due to cirrhosis.  Improving. -Continue monitoring                 DVT prophylaxis: SCD in the setting of thrombocytopenia Code Status: Full code Family Communication: None at baseline.  Updated patient's wife over the phone.  Discharge barrier: Right neck mass evaluation-awaiting biopsy by IR, and CIR bed Patient is from: Home Final disposition: Likely CIR after biopsy in the next 24 to 48 hours.  Consultants:  Infectious disease ENT Oncology (over the phone) IR   Sch Meds:  Scheduled Meds: . amitriptyline  25 mg Oral QHS  . atenolol  25 mg Oral q AM  . escitalopram  10 mg Oral q AM  . hydrALAZINE  50 mg Oral Q8H  . lactulose  30 g Oral TID  . lisinopril  20 mg Oral BID  . sodium chloride flush  10-40 mL Intracatheter Q12H   Continuous Infusions: . ampicillin-sulbactam (UNASYN) IV 3 g (07/10/19 0618)   PRN Meds:.hydrALAZINE, sodium chloride flush  Antimicrobials: Anti-infectives (From admission, onward)   Start     Dose/Rate Route Frequency Ordered Stop   07/07/19 0000  amoxicillin-clavulanate (AUGMENTIN) 875-125 MG tablet     1 tablet Oral 2 times daily 07/07/19 0940 07/28/19 2359   07/05/19 1300  Ampicillin-Sulbactam (UNASYN) 3 g in sodium chloride 0.9 % 100 mL IVPB     3  g 200 mL/hr over 30 Minutes Intravenous Every 6 hours 07/05/19 1226     07/04/19 2300  vancomycin (VANCOREADY) IVPB 1750 mg/350 mL  Status:  Discontinued     1,750 mg 175 mL/hr over 120 Minutes Intravenous Every 24 hours 07/04/19 0754 07/05/19 1226   07/03/19 1600  cefTRIAXone (ROCEPHIN) 2 g in sodium chloride 0.9 % 100 mL IVPB  Status:  Discontinued     2 g 200 mL/hr over 30 Minutes Intravenous Every 24 hours 07/03/19 1437 07/05/19 1226   07/02/19 2300  vancomycin (VANCOREADY) IVPB 1500 mg/300 mL  Status:  Discontinued     1,500 mg 150 mL/hr over 120 Minutes Intravenous Every 24 hours 07/01/19 2223 07/04/19 0754   07/02/19 0600  ceFEPIme (MAXIPIME) 2 g in sodium chloride 0.9 % 100 mL IVPB  Status:  Discontinued     2 g 200 mL/hr over 30 Minutes Intravenous Every 8 hours 07/01/19 2223 07/03/19 1437   07/02/19 0600  metroNIDAZOLE (FLAGYL) IVPB 500 mg  Status:  Discontinued     500 mg 100 mL/hr over 60 Minutes Intravenous Every 8 hours 07/01/19 2350 07/05/19 1226   07/01/19 2130  vancomycin (VANCOREADY) IVPB 1750 mg/350 mL     1,750 mg  175 mL/hr over 120 Minutes Intravenous  Once 07/01/19 2115 07/02/19 0235   07/01/19 2115  ceFEPIme (MAXIPIME) 2 g in sodium chloride 0.9 % 100 mL IVPB  Status:  Discontinued     2 g 200 mL/hr over 30 Minutes Intravenous Every 12 hours 07/01/19 2109 07/01/19 2223   07/01/19 2115  metroNIDAZOLE (FLAGYL) IVPB 500 mg     500 mg 100 mL/hr over 60 Minutes Intravenous  Once 07/01/19 2109 07/02/19 0025   07/01/19 2115  vancomycin (VANCOCIN) IVPB 1000 mg/200 mL premix  Status:  Discontinued     1,000 mg 200 mL/hr over 60 Minutes Intravenous  Once 07/01/19 2109 07/01/19 2115       I have personally reviewed the following labs and images: CBC: Recent Labs  Lab 07/04/19 0248 07/05/19 0315 07/08/19 0810 07/10/19 0103  WBC 8.3 7.9 5.5 6.6  HGB 12.9* 13.4 12.6* 12.6*  HCT 38.4* 39.5 37.4* 37.2*  MCV 99.0 99.7 100.5* 99.7  PLT 90* 104* 112* 123*   BMP  &GFR Recent Labs  Lab 07/04/19 0248 07/05/19 0315 07/08/19 0810 07/10/19 0103  NA 138 138 144 144  K 3.6 4.2 3.5 3.6  CL 109 108 109 111  CO2 20* 22 25 23   GLUCOSE 98 89 94 87  BUN 17 17 17 15   CREATININE 0.82 0.83 0.97 0.78  CALCIUM 7.8* 7.9* 8.4* 8.3*  MG  --   --  1.7 1.5*  PHOS  --   --  2.4* 1.9*   Estimated Creatinine Clearance: 78.9 mL/min (by C-G formula based on SCr of 0.78 mg/dL). Liver & Pancreas: Recent Labs  Lab 07/04/19 0248 07/05/19 0315 07/08/19 0810  AST 48* 45*  --   ALT 23 22  --   ALKPHOS 59 54  --   BILITOT 2.3* 2.2*  --   PROT 5.5* 5.4*  --   ALBUMIN 2.1* 2.2* 2.2*   No results for input(s): LIPASE, AMYLASE in the last 168 hours. Recent Labs  Lab 07/07/19 0948  AMMONIA 40*   Diabetic: No results for input(s): HGBA1C in the last 72 hours. No results for input(s): GLUCAP in the last 168 hours. Cardiac Enzymes: No results for input(s): CKTOTAL, CKMB, CKMBINDEX, TROPONINI in the last 168 hours. No results for input(s): PROBNP in the last 8760 hours. Coagulation Profile: No results for input(s): INR, PROTIME in the last 168 hours. Thyroid Function Tests: No results for input(s): TSH, T4TOTAL, FREET4, T3FREE, THYROIDAB in the last 72 hours. Lipid Profile: No results for input(s): CHOL, HDL, LDLCALC, TRIG, CHOLHDL, LDLDIRECT in the last 72 hours. Anemia Panel: No results for input(s): VITAMINB12, FOLATE, FERRITIN, TIBC, IRON, RETICCTPCT in the last 72 hours. Urine analysis:    Component Value Date/Time   COLORURINE AMBER (A) 07/01/2019 2058   APPEARANCEUR CLEAR 07/01/2019 2058   LABSPEC 1.018 07/01/2019 2058   PHURINE 6.0 07/01/2019 2058   [place] NEGATIVE 07/01/2019 2058   HGBUR NEGATIVE 07/01/2019 2058   [place] NEGATIVE 07/01/2019 2058   [place] NEGATIVE 07/01/2019 2058   PROTEINUR NEGATIVE 07/01/2019 2058   UROBILINOGEN 1.0 08/10/2011 1826   NITRITE NEGATIVE 07/01/2019 2058   LEUKOCYTESUR NEGATIVE 07/01/2019 2058   Sepsis  Labs: Invalid input(s): PROCALCITONIN, [place]  Microbiology: Recent Results (from the past 240 hour(s))  Culture, blood (Routine x 2)     Status: None   Collection Time: 07/01/19  8:48 PM   Specimen: BLOOD  Result Value Ref Range Status   Specimen Description BLOOD RIGHT FOREARM  Final  Special Requests   Final    BOTTLES DRAWN AEROBIC AND ANAEROBIC Blood Culture adequate volume   Culture   Final    NO GROWTH 5 DAYS Performed at [facility], [place] [address], [place], [place] [address]    Report Status 07/06/2019 FINAL  Final  Culture, blood (Routine x 2)     Status: None   Collection Time: 07/01/19  8:58 PM   Specimen: BLOOD  Result Value Ref Range Status   Specimen Description BLOOD LEFT FOREARM  Final   Special Requests   Final    BOTTLES DRAWN AEROBIC AND ANAEROBIC Blood Culture adequate volume   Culture   Final    NO GROWTH 5 DAYS Performed at [facility], [place] [address] [place], [place] [address]    Report Status 07/06/2019 FINAL  Final  SARS CORONAVIRUS 2 (TAT 6-24 HRS) Nasopharyngeal Nasopharyngeal Swab     Status: None   Collection Time: 07/01/19  9:51 PM   Specimen: Nasopharyngeal Swab  Result Value Ref Range Status   SARS Coronavirus 2 NEGATIVE NEGATIVE Final    Comment: (NOTE) SARS-CoV-2 target nucleic acids are NOT DETECTED. The SARS-CoV-2 RNA is generally detectable in upper and lower respiratory specimens during the acute phase of infection. Negative results do not preclude SARS-CoV-2 infection, do not rule out co-infections with other pathogens, and should not be used as the sole basis for treatment or other patient management decisions. Negative results must be combined with clinical observations, patient history, and epidemiological information. The expected result is Negative. Fact Sheet for Patients: SugarRoll.be Fact Sheet for Healthcare Providers: https://www.woods-mathews.com/ This test  is not yet approved or cleared by the [place] FDA and  has been authorized for detection and/or diagnosis of SARS-CoV-2 by FDA under an Emergency Use Authorization (EUA). This EUA will remain  in effect (meaning this test can be used) for the duration of the COVID-19 declaration under Section 56 4(b)(1) of the Act, 21 U.S.C. section 360bbb-3(b)(1), unless the authorization is terminated or revoked sooner. Performed at [facility], [place] [address], [place], [place] [address]   Body fluid culture     Status: None   Collection Time: 07/04/19  2:20 PM   Specimen: Neck; Body Fluid  Result Value Ref Range Status   Specimen Description NECK  Final   Special Requests NONE  Final   Gram Stain   Final    RARE WBC PRESENT,BOTH PMN AND MONONUCLEAR RARE GRAM POSITIVE COCCI Performed at [facility], [place] [address] [place], [place] [address]    Culture RARE STAPHYLOCOCCUS EPIDERMIDIS  Final   Report Status 07/08/2019 FINAL  Final   Organism ID, Bacteria STAPHYLOCOCCUS EPIDERMIDIS  Final      Susceptibility   Staphylococcus epidermidis - MIC*    CIPROFLOXACIN <=0.5 SENSITIVE Sensitive     ERYTHROMYCIN <=0.25 SENSITIVE Sensitive     GENTAMICIN <=0.5 SENSITIVE Sensitive     OXACILLIN <=0.25 SENSITIVE Sensitive     TETRACYCLINE <=1 SENSITIVE Sensitive     VANCOMYCIN 2 SENSITIVE Sensitive     TRIMETH/SULFA <=10 SENSITIVE Sensitive     CLINDAMYCIN <=0.25 SENSITIVE Sensitive     RIFAMPIN <=0.5 SENSITIVE Sensitive     Inducible Clindamycin NEGATIVE Sensitive     * RARE STAPHYLOCOCCUS EPIDERMIDIS  MRSA PCR Screening     Status: None   Collection Time: 07/05/19 11:18 AM   Specimen: Nasal Mucosa; Nasopharyngeal  Result Value Ref Range Status   MRSA by PCR  NEGATIVE NEGATIVE Final    Comment:        The GeneXpert MRSA Assay (FDA approved for NASAL specimens only), is one component of a comprehensive MRSA colonization surveillance program. It is not intended to diagnose  MRSA infection nor to guide or monitor treatment for MRSA infections. Performed at [facility], [place] [address], [place], [place] [address]     Radiology Studies: No results found.   Yandriel Boening T. Hendricks  If 7PM-7AM, please contact night-coverage [email or web address] Password [email or web address] 07/10/2019, 12:00 PM

## 2019-07-10 NOTE — Procedures (Signed)
Pre Procedure Dx: Right sided neck mass and cervical LAN Post Procedural Dx: Same  Technically successful US guided biopsy of right sided neck mass. Technically successful US guided biopsy of right sided cervical lymph node.  EBL: None No immediate complications.   Ronny Bacon, MD Pager #: [phone]

## 2019-07-10 NOTE — Progress Notes (Signed)
Inpatient Rehab Admissions:  Inpatient Rehab Consult received.  I met with pt at the bedside for rehabilitation assessment and to discuss goals and expectations of an inpatient rehab admission.  Wife present as well. Both acknowledged understanding of goals and expectations. Pt appears to be an appropriate candidate for CIR.  Both are agreeable to potential CIR admission. Will begin insurance authorization process for potential admission to CIR.  Signed: Gayland Curry, [place], [place] Admissions Coordinator [phone]

## 2019-07-10 NOTE — PMR Pre-admission (Signed)
PMR Admission Coordinator Pre-Admission Assessment  Patient: Glen Finley is an 85 y.o., male MRN: [id number] DOB: [birth year] Height:   Weight: 87.3 kg  Insurance Information HMO:     PPO: Yes    PCP:      IPA:      80/20:      OTHER:  PRIMARY: [employer] MCR      Policy#: [id number]      Subscriber: patient CM Name:       Phone#: [phone]     Fax#: [phone] Pre-Cert#: [id number] Auth pro vided by Maudie Mercury with [place].  Updates due on 07/18/19 to fax listed above.      Employer:  Benefits:  Phone #: verified online via [email or web address]     Name:  Eff. Date: 03/24/2019-still active     Deduct: NA      Out of Pocket Max: $3,900 ($43.51 met)      Life Max:  CIR: $345/day co-pay for days 1-5, $0/day co-pay for days 6+      SNF: $0/day co-pay for days 1-20, $184/day co-pay for days 21-42, $0/day co-pay for days 43-100; limited to 100 days/cal year Outpatient: $35/visit co-pay; limited by medical necessity     Co-Pay:  Home Health: 100% coverage, 0% co-insurance; limited by medical necessity      Co-Pay:  DME: 80% coverage; 20% co-insurance     Co-Pay:  Providers:  SECONDARY:       Policy#:      Phone#:   Development worker, community:       Phone#:   The Therapist, art Information Summary" for patients in Inpatient Rehabilitation Facilities with attached "Privacy Act [place] Records" was provided and verbally reviewed with: Patient and Family  Emergency Contact Information Contact Information    Name Relation Home Work Mobile   Morse Spouse [phone]  [phone]   [facility] Daughter [phone]     Harley Hallmark [phone]        Current Medical History  Patient Admitting Diagnosis: Valentine a Glantz is an 85 year old right-handed male with history of hyperlipidemia, Parkinson's versus Lewy body disease, bipolar disorder, diastolic congestive heart failure hypertension, myasthenia gravis/gait disorder on no medications without exacerbation followed by neurology  Dr. Krista Blue, obesity with BMI 29.43.  Patient recently discharged 06/30/2019 after being treated for acute hepatic encephalopathy in the setting of lactulose noncompliance.  Per chart review patient lives with spouse.  1 level home with ramped entrance.  Reportedly independent but sedentary prior to admission.  Presented 07/01/2019 with fever and altered mental status.  Admission chemistries BUN 23, creatinine 1.12, total bilirubin 3.2, blood cultures no growth to date, lactic acid 2.4, hemoglobin 14.8, platelets 94,000, urinalysis negative nitrite, ammonia 92, SARS coronavirus negative.  CT of abdomen pelvis showed liver cirrhosis with evidence for portal hypertension.  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  Left colon diverticular disease without acute inflammatory process.  He was placed on broad-spectrum antibiotics.  CT maxillofacial as well as MRI cervical spine soft tissue of the neck CT showed abnormal prevertebral retropharyngeal fluid or edema extending from C1-C2 level inferiorly to the C7 level.  Extensive inflammatory stranding and phlegmonous change extending into the right neck soft tissue centered at the C5 level.  Findings suspicious for associated 3.0 x 2.0 abscess along the right posterior aspect of the thyroid cartilage at the C5 level.  Patient had ultrasound-guided FNA of right neck mass by interventional radiology 07/04/2019 culture rare GPC.  Blood  cultures negative so far.  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  Oncology and ENT recommended repeat biopsy which was done 07/10/2019 pathology showed positive straining for TTF-1-1 raising the possibility of metastatic poorly differentiated lung non-small cell carcinoma..  Medical oncology has been consulted current plan supportive care.  All antibiotics have since been discontinued.  In regards to patient's acute hepatic metabolic encephalopathy he continues on lactulose 30 mg  3 times daily his ammonia levels have improved to 30.  Patient is tolerating a regular diet.  Therapy evaluations completed and patient was recommended for a comprehensive rehab program.  Patient's medical record from [facility] has been reviewed by the rehabilitation admission coordinator and physician.  Past Medical History  Past Medical History:  Diagnosis Date  . Abnormality of gait   . GERD (gastroesophageal reflux disease)   . Headache(784.0)   . Hyperlipidemia   . Hypertension   . Migraine   . Myasthenia gravis without exacerbation ([place])   . Obesity   . Other extrapyramidal disease and abnormal movement disorder   . REM sleep behavior disorder   . Unspecified psychosis   . Weakness     Family History   family history is not on file.  Prior Rehab/Hospitalizations Has the patient had prior rehab or hospitalizations prior to admission? Yes  Has the patient had major surgery during 100 days prior to admission? No   Current Medications  Current Facility-Administered Medications:  .  amitriptyline (ELAVIL) tablet 25 mg, 25 mg, Oral, QHS, Dessa Phi, DO, 25 mg at 07/16/19 2204 .  atenolol (TENORMIN) tablet 25 mg, 25 mg, Oral, q AM, Dessa Phi, DO, 25 mg at 07/17/19 0617 .  escitalopram (LEXAPRO) tablet 10 mg, 10 mg, Oral, q AM, Dessa Phi, DO, 10 mg at 07/17/19 0617 .  furosemide (LASIX) tablet 20 mg, 20 mg, Oral, Daily, Gonfa, Taye T, MD, 20 mg at 07/17/19 0901 .  hydrALAZINE (APRESOLINE) injection 10 mg, 10 mg, Intravenous, Q4H PRN, Wendee Beavers T, MD, 10 mg at 07/12/19 0412 .  hydrALAZINE (APRESOLINE) tablet 50 mg, 50 mg, Oral, Q8H, Gonfa, Taye T, MD, 50 mg at 07/17/19 0617 .  lactulose (CHRONULAC) 10 GM/15ML solution 30 g, 30 g, Oral, TID, Gonfa, Taye T, MD, 30 g at 07/17/19 0900 .  lisinopril (ZESTRIL) tablet 20 mg, 20 mg, Oral, BID, Dessa Phi, DO, 20 mg at 07/17/19 0900 .  sodium chloride flush (NS) 0.9 % injection 10-40 mL, 10-40 mL,  Intracatheter, Q12H, Gonfa, Taye T, MD, 10 mL at 07/17/19 0901 .  sodium chloride flush (NS) 0.9 % injection 10-40 mL, 10-40 mL, Intracatheter, PRN, Mercy Riding, MD  Patients Current Diet:  Diet Order            Diet regular Room service appropriate? Yes with Assist; Fluid consistency: Thin  Diet effective now              Precautions / Restrictions Precautions Precautions: Fall Restrictions Weight Bearing Restrictions: No   Has the patient had 2 or more falls or a fall with injury in the past year? Yes  Prior Activity Level Limited Community (1-2x/wk): wife/pt go to Bible study, errands  Prior Functional Level Self Care: Did the patient need help bathing, dressing, using the toilet or eating? Independent  Indoor Mobility: Did the patient need assistance with walking from room to room (with or without device)? Independent  Stairs: Did the patient need assistance with internal or external stairs (with or without device)? Needed some  help  Functional Cognition: Did the patient need help planning regular tasks such as shopping or remembering to take medications? Needed some help  Home Assistive Devices / Equipment Home Equipment: Walker - 2 wheels  Prior Device Use: Indicate devices/aids used by the patient prior to current illness, exacerbation or injury? cane  Current Functional Level Cognition  Overall Cognitive Status: History of cognitive impairments - at baseline Current Attention Level: Sustained Orientation Level: Oriented X4 Following Commands: Follows one step commands inconsistently, Follows one step commands with increased time Safety/Judgement: Decreased awareness of safety, Decreased awareness of deficits General Comments: Increased difficulty following one step instructions, initiating, and sequencing tasks this date. Pt required mod to max multimodal cues to complete all tasks. Frequent cues for safety when ambulating with RW.     Extremity  Assessment (includes Sensation/Coordination)  Upper Extremity Assessment: Generalized weakness  Lower Extremity Assessment: Generalized weakness    ADLs  Overall ADL's : Needs assistance/impaired Eating/Feeding: Set up, Sitting Grooming: Wash/dry hands, Wash/dry face, Oral care, Min guard Grooming Details (indicate cue type and reason): While standing at the sink Upper Body Bathing: Set up, Standing Lower Body Bathing: Min guard, Cueing for safety, Cueing for sequencing, Sit to/from stand, Sitting/lateral leans Upper Body Dressing : Set up, Sitting Lower Body Dressing: Set up, Supervision/safety, Sitting/lateral leans Lower Body Dressing Details (indicate cue type and reason): While seated in bedside chair. Figure four position to don/doff socks.  Toilet Transfer: Minimal assistance, Ambulation, Regular Toilet, Grab bars, RW Toilet Transfer Details (indicate cue type and reason): Pt able to ambulate to/from bathroom with RW and min guard. Assist to safely sit on toilet and power up into standing once finished. Cues for hand placement.  Toileting- Clothing Manipulation and Hygiene: Minimal assistance, Sit to/from stand Toileting - Clothing Manipulation Details (indicate cue type and reason): Assist for clothing management and balance Functional mobility during ADLs: Min guard, Rolling walker General ADL Comments: Pt able to ambulate to/from bathroom with RW and min guard. 0 instances of LOB however pt unsteady on feet requiring frequent cues for safety and body positioning with RW. Pt tolerated standing 1 x 6 min at the sink to complete grooming/hygiene tasks.     Mobility  Overal bed mobility: Needs Assistance Bed Mobility: Supine to Sit Supine to sit: HOB elevated, Supervision Sit to supine: Min assist General bed mobility comments: supervision for safety. Increased time and effort with use of bed rails and HOB elevated.    Transfers  Overall transfer level: Needs assistance Equipment  used: Rolling walker (2 wheeled), None Transfers: Sit to/from Stand Sit to Stand: Min assist Stand pivot transfers: Min assist General transfer comment: Occasional assist to power up into standing. Pt required several attempts to stand from bed. Cues for hand placement and safety.     Ambulation / Gait / Stairs / Wheelchair Mobility  Ambulation/Gait Ambulation/Gait assistance: Min guard, Herbalist (Feet): 250 Feet(1 seated rest brake) Assistive device: Rolling walker (2 wheeled) Gait Pattern/deviations: Step-through pattern, Decreased stride length, Drifts right/left General Gait Details: Cues throughout for RW safety. After negotiating stairs, pt be same noticably fatigued and unbalance, requiring increased assistance and a seated rest brake to recover. LOB x2 requiring min A to steady. SpO2 92%, HR 64 BPM Gait velocity: decr Gait velocity interpretation: <1.31 ft/sec, indicative of household ambulator Stairs: Yes Stairs assistance: Min assist, Mod assist Stair Management: Two rails, Forwards Number of Stairs: 3 General stair comments: Pt was able to ascend steps with min  A for safety and balance. To descend the last step pt required mod A for balance and safety. He became more unsteady as he fatigued.    Posture / Balance Dynamic Sitting Balance Sitting balance - Comments: UE support  Balance Overall balance assessment: Needs assistance Sitting-balance support: Feet supported Sitting balance-Leahy Scale: Fair Sitting balance - Comments: UE support  Postural control: Right lateral lean Standing balance support: Bilateral upper extremity supported, During functional activity, No upper extremity supported Standing balance-Leahy Scale: Fair Standing balance comment: min A without device, S to minguard with walker for balance during hygiene after on BSC    Special needs/care consideration Skin ecchymosis-arm, hand (right, left), urine incontinence, Ampicillin-Sulbactam  275m/hr and Designated visitor [employer] wife. AKnox Saliva grandson   Previous HEnvironmental health practitioner(from acute therapy documentation) Living Arrangements: Spouse/significant other  Lives With: Spouse Available Help at Discharge: Family, Available 24 hours/day Type of Home: House Home Layout: One level Home Access: Stairs to enter, Ramped entrance Entrance Stairs-Rails: Right Entrance Stairs-Number of Steps: 3 Bathroom Shower/Tub: TChiropodist Standard Bathroom Accessibility: Yes How Accessible: Accessible via walker [place] No  Discharge Living Setting Plans for Discharge Living Setting: Patient's home Type of Home at Discharge: House Discharge Home Layout: One level Discharge Home Access: Stairs to enter, [place]West [place]rance Discharge Bathroom Shower/Tub: TTregounit Discharge Bathroom Toilet: Standard Discharge Bathroom Accessibility: Yes How Accessible: Accessible via walker Does the patient have any problems obtaining your medications?: No  Social/Family/Support Systems Patient Roles: Spouse Anticipated Caregiver: SOniel Meleski wife. AKarlton LemonAnticipated Caregiver's Contact Information: [phone]-EXHBZJIRCaregiver Availability: 24/7 Discharge Plan Discussed with Primary Caregiver: Yes Is Caregiver In Agreement with Plan?: Yes Does Caregiver/Family have Issues with Lodging/Transportation while Pt is in Rehab?: No  Goals Patient/Family Goal for Rehab: PT/OT/SLP supervision to mod I Expected length of stay: 5-7 days Pt/Family Agrees to Admission and willing to participate: Yes Program Orientation Provided & Reviewed with Pt/Caregiver Including Roles  & Responsibilities: Yes  Decrease burden of Care through IP rehab admission: NA  Possible need for SNF placement upon discharge: NA d/t reliable d/c plan and anticipation of pt reaching goals  Patient Condition: [facility] CM, and patient and spouse. I met with patient at the bedside for inpatient rehabilitation assessment.  Patient will benefit from ongoing PT, OT and SLP, can actively participate in 3 hours of therapy a day 5 days of the week, and can make measurable gains during the admission.  Patient will also benefit from the coordinated team approach during an Inpatient Acute Rehabilitation admission.  The patient will receive intensive therapy as well as Rehabilitation physician, nursing, social worker, and care management interventions.  Due to bladder management, bowel management, safety, skin/wound care, disease management, medication administration, pain management and patient education the patient requires 24 hour a day rehabilitation nursing.  The patient is currently min guard with mobility, mod assist for stair negotiation, and min assist basic ADLs.  Discharge setting and therapy post discharge at home with home health is anticipated.  Patient has agreed to participate in the Acute Inpatient Rehabilitation Program and will admit Sunday.  Preadmission Screen Completed By:  LBethel Born 07/10/2019 4:26 PM, with brief updates by CShann Medal PT, DPT 07/17/19 12:55 PM  ______________________________________________________________________   Discussed status with Dr. KLetta Pateon 07/17/19  at 12:55 PM  and received approval for admission today.  Admission Coordinator:  LBethel Born  CCC-SLP, Shann Medal PT, DPT, time 12:55 PM / Date 07/17/19    Assessment/Plan: Diagnosis: pharyngeal mass, ?metastatic lung ca. Hx of PD/MG.  1. Does the need for close, 24 hr/day Medical supervision in concert with the patient's rehab needs make it unreasonable for this patient to be served in a less intensive setting? Yes 2. Co-Morbidities requiring supervision/potential complications: hepatic encephalopathy, bipolar d/o, dchf, htn 3. Due to bladder management, bowel  management, safety, skin/wound care, disease management, medication administration, pain management and patient education, does the patient require 24 hr/day rehab nursing? Yes 4. Does the patient require coordinated care of a physician, rehab nurse, PT, OT, and SLP to address physical and functional deficits in the context of the above medical diagnosis(es)? Yes Addressing deficits in the following areas: balance, endurance, locomotion, strength, transferring, bowel/bladder control, bathing, dressing, feeding, grooming, toileting, cognition, speech, swallowing and psychosocial support 5. Can the patient actively participate in an intensive therapy program of at least 3 hrs of therapy 5 days a week? Yes 6. The potential for patient to make measurable gains while on inpatient rehab is excellent 7. Anticipated functional outcomes upon discharge from inpatient rehab: modified independent and supervision PT, modified independent and supervision OT, modified independent and supervision SLP 8. Estimated rehab length of stay to reach the above functional goals is: 5-8 days 9. Anticipated discharge destination: Home 10. Overall Rehab/Functional Prognosis: excellent   MD Signature: Meredith Staggers, MD, [facility] 07/17/2019

## 2019-07-10 NOTE — Plan of Care (Signed)

## 2019-07-10 NOTE — Progress Notes (Signed)
Physical Therapy Treatment Patient Details Name: Glen Finley MRN: [id number] DOB: 02-10-1935 Today's Date: 07/10/2019    History of Present Illness Glen Finley is an 85 y.o. 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  He was recently admitted to the hospital and discharged 06/30/19 after being treated for acute hepatic encephalopathy.    PT Comments    Pt making good progress with mobility. Continue to recommend CIR prior to return home with wife.    Follow Up Recommendations  CIR;Supervision/Assistance - 24 hour     Equipment Recommendations  Wheelchair (measurements PT)    Recommendations for Other Services       Precautions / Restrictions Precautions Precautions: Fall Restrictions Weight Bearing Restrictions: No    Mobility  Bed Mobility Overal bed mobility: Needs Assistance Bed Mobility: Supine to Sit     Supine to sit: HOB elevated;Min guard     General bed mobility comments: incr time and effort.   Transfers Overall transfer level: Needs assistance Equipment used: Rolling walker (2 wheeled) Transfers: Sit to/from Stand Sit to Stand: Min assist         General transfer comment: Assist to bring hips up and for balance. Verbal cues for hand placement.   Ambulation/Gait Ambulation/Gait assistance: Min assist Gait Distance (Feet): 150 Feet Assistive device: Rolling walker (2 wheeled) Gait Pattern/deviations: Step-to pattern;Trunk flexed;Decreased stride length;Staggering left;Staggering right Gait velocity: decr Gait velocity interpretation: <1.31 ft/sec, indicative of household ambulator General Gait Details: Assist with balance and support. Pt with improved step length but with some  intermittent staggering   Stairs             Wheelchair Mobility    Modified Rankin (Stroke Patients Only)       Balance Overall balance assessment: Needs assistance Sitting-balance support: Feet supported;No upper extremity supported Sitting balance-Leahy Scale: Fair     Standing balance support: Bilateral upper extremity supported;During functional activity Standing balance-Leahy Scale: Poor Standing balance comment: walker and min guard assist for static standing                            Cognition Arousal/Alertness: Awake/alert Behavior During Therapy: WFL for tasks assessed/performed Overall Cognitive Status: History of cognitive impairments - at baseline                                        Exercises      General Comments        Pertinent Vitals/Pain Pain Assessment: No/denies pain    Home Living                      Prior Function            PT Goals (current goals can now be found in the care plan section) Acute Rehab PT Goals Patient Stated Goal: to go home Progress towards PT goals: Progressing toward goals    Frequency    Min 3X/week      PT Plan Current plan remains appropriate    Co-evaluation              AM-PAC PT "6 Clicks" Mobility   Outcome  Measure  Help needed turning from your back to your side while in a flat bed without using bedrails?: A Little Help needed moving from lying on your back to sitting on the side of a flat bed without using bedrails?: A Little Help needed moving to and from a bed to a chair (including a wheelchair)?: A Little Help needed standing up from a chair using your arms (e.g., wheelchair or bedside chair)?: A Little Help needed to walk in hospital room?: A Little Help needed climbing 3-5 steps with a railing? : A Lot 6 Click Score: 17    End of Session Equipment Utilized During Treatment: Gait belt Activity Tolerance: Patient tolerated treatment  well Patient left: with call bell/phone within reach;with family/visitor present;in chair Nurse Communication: Mobility status PT Visit Diagnosis: Unsteadiness on feet (R26.81);Muscle weakness (generalized) (M62.81)     Time: 7482-7078 PT Time Calculation (min) (ACUTE ONLY): 24 min  Charges:  $Gait Training: 23-37 mins                     [place] Pager [phone] [place] 07/10/2019, 4:02 PM

## 2019-07-11 LAB — AMMONIA: Ammonia: 30 umol/L (ref 9–35)

## 2019-07-11 MED ORDER — HYDRALAZINE HCL 20 MG/ML IJ SOLN
10.0000 mg | INTRAMUSCULAR | Status: DC | PRN
  Administered 2019-07-12: 10 mg via INTRAVENOUS
  Filled 2019-07-11: qty 1

## 2019-07-11 MED ORDER — FUROSEMIDE 20 MG PO TABS
20.0000 mg | ORAL_TABLET | Freq: Every day | ORAL | Status: DC
  Administered 2019-07-11 – 2019-07-17 (×7): 20 mg via ORAL
  Filled 2019-07-11 (×7): qty 1

## 2019-07-11 NOTE — Progress Notes (Signed)
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  He was started on broad-spectrum antibiotics.  Later in the course, he was diagnosed with retropharyngeal and right neck abscess.    MRI cervical spine on 4/11 revealed abnormal retropharyngeal/prevertebral edema with associated phlegmonous change within the lower right neck concerning for acute infection with probable superimposed 2 cm abscess.  CT soft tissue neck with contrast on 4/12 redemonstrated abnormal prevertebral/retropharyngeal fluid or edema extending from the C1-C2 level inferiorly to the C7 level. Extensive inflammatory stranding and phlegmonous change extending into the right neck soft tissues centered at the C5 level. Findings suspicious for an associated 3.0 x 2.0 abscess along the right posterior aspect of the thyroid cartilage at the C5 level. Heterogeneous enhancement of the right thyroid lobe suspicious for secondary thyroiditis  ENT, ID and IR consulted.  Patient had US guided FNA of right neck mass by IR on 4/13.  Culture with rare GPC.  Blood cultures negative so far.  He was on vancomycin and ceftriaxone, and transitioned to Unasyn on 4/14 .  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  Oncology and ENT recommended repeat biopsy which was done on 4/19.  Pathology pending.  ID recommended discontinuing antibiotic if repeat biopsy confirms malignancy.  Evaluated by PT/OT who recommended CIR.  Waiting on insurance authorization and CIR bed.  Subjective: Seen and examined earlier this morning.  No major events  overnight or this morning.  No complaint this morning.  He denies chest pain, dyspnea, GI or UTI symptoms.  Awake and oriented x4.  Objective: Vitals:   07/11/19 0500 07/11/19 0619 07/11/19 1000 07/11/19 1302  BP:  (!) 170/70 (!) 141/89 (!) 162/64  Pulse:   65 63  Resp:   20 16  Temp:  98.7 F (37.1 C)  98 F (36.7 C)  TempSrc:  Oral  Oral  SpO2:  92%  95%  Weight: 96.2 kg       Intake/Output Summary (Last 24 hours) at 07/11/2019 1314 Last data filed at 07/11/2019 1207 Gross per 24 hour  Intake 100 ml  Output 600 ml  Net -500 ml   Filed Weights   07/08/19 0500 07/10/19 0402 07/11/19 0500  Weight: 91.1 kg 93.2 kg 96.2 kg    Examination:  GENERAL: No apparent distress.  Nontoxic. HEENT: MMM.  Vision and hearing grossly intact.  NECK: Supple.  No apparent JVD.  RESP: On room air.  No IWOB.  Fair aeration bilaterally. CVS:  RRR. Heart sounds normal.  ABD/GI/GU: Bowel sounds present. Soft. Non tender.  MSK/EXT:  Moves extremities. No apparent deformity. No edema.  SKIN: Diffuse erythematous macules over BLE NEURO: Awake but slow. oriented self and place but not time..  No apparent focal neuro deficit. PSYCH: Calm. Normal affect.   Procedures:  4/13- US guided FNA of right neck 4/19-US core biopsy of cervical lymph node 4/19-US core biopsy of right cervical soft tissue  Microbiology summarized: 4/10-COVID-19 PCR negative. 4/10-blood cultures NGTD so far 4/13-right neck tissue culture with staph epidermis.  Assessment & Plan: Sepsis, secondary to retropharyngeal infection/right neck abscess-clinically improving. Blood cultures and  MRSA PCR negative. MRI cervical spine w/wo contrast and CT soft tissue of neck with contrast as above. Tissue cultures from 4/13 with staph epidermis.  Initial cytology with malignant cells.   -Follow pathology from repeat biopsy -Vancomycin and ceftriaxone>>>Unasyn.  Per ID, can stop antibiotics if repeat biopsy confirms malignancy.  Right  neck mass: Cytology from [place] FNA by IR on 4/13 with malignant cells. Discussed with oncology, Dr. Salem Senate who suggested ENT evaluation and repeat biopsy. Dr. Blenda Nicely from [facility] ENT following, and recommended repeat biopsy, which was done on 4/19.  Pathology pending. -Follow-up pathology from 4/19  Acute respiratory failure with hypoxia-likely due to the above.  Resolved.  On room air. -Encourage incentive spirometry and OOB  Acute hepatic/metabolic encephalopathy, fatty liver disease, cirrhosis: Ammonia 92 on admit> 40> 30.  Improved.  He is oriented x4 except to date. -Continue lactulose to 30 mg 3 times daily and titrate for 2-3 BM a day. -Frequent orientation delirium precautions.  Chronic diastolic heart failure: Stable -Resume home Lasix. -Monitor intake and output  Myasthenia gravis, progressive gait disorder -Follow-up with neurology outpatient -PT/OT-recommended CIR.  CIR following.  Essential hypertension: Blood pressure elevated. -Continue lisinopril, atenolol and hydralazine -Increase as needed hydralazine to 10 mg -Resume home Lasix  Depression: Stable -Continue amitriptyline, lexapro   Thrombocytopenia: Likely due to cirrhosis.  Improving. -Continue monitoring                 DVT prophylaxis: Start subcu Lovenox Code Status: Full code Family Communication: None at baseline.  Updated patient's wife on 4/19  Discharge barrier: Insurance authorization for CIR and CIR bed Patient is from: Home Final disposition: CIR when bed is available.  Consultants:  Infectious disease ENT Oncology (over the phone) IR   Sch Meds:  Scheduled Meds: . amitriptyline  25 mg Oral QHS  . atenolol  25 mg Oral q AM  . escitalopram  10 mg Oral q AM  . furosemide  20 mg Oral Daily  . hydrALAZINE  50 mg Oral Q8H  . lactulose  30 g Oral TID  . lisinopril  20 mg Oral BID  . sodium chloride flush  10-40 mL Intracatheter Q12H   Continuous Infusions: .  ampicillin-sulbactam (UNASYN) IV 3 g (07/11/19 1302)   PRN Meds:.hydrALAZINE, sodium chloride flush  Antimicrobials: Anti-infectives (From admission, onward)   Start     Dose/Rate Route Frequency Ordered Stop   07/07/19 0000  amoxicillin-clavulanate (AUGMENTIN) 875-125 MG tablet     1 tablet Oral 2 times daily 07/07/19 0940 07/28/19 2359   07/05/19 1300  Ampicillin-Sulbactam (UNASYN) 3 g in sodium chloride 0.9 % 100 mL IVPB     3 g 200 mL/hr over 30 Minutes Intravenous Every 6 hours 07/05/19 1226     07/04/19 2300  vancomycin (VANCOREADY) IVPB 1750 mg/350 mL  Status:  Discontinued     1,750 mg 175 mL/hr over 120 Minutes Intravenous Every 24 hours 07/04/19 0754 07/05/19 1226   07/03/19 1600  cefTRIAXone (ROCEPHIN) 2 g in sodium chloride 0.9 % 100 mL IVPB  Status:  Discontinued     2 g 200 mL/hr over 30 Minutes Intravenous Every 24 hours 07/03/19 1437 07/05/19 1226   07/02/19 2300  vancomycin (VANCOREADY) IVPB 1500 mg/300 mL  Status:  Discontinued     1,500 mg 150 mL/hr over 120 Minutes Intravenous Every 24 hours 07/01/19 2223 07/04/19 0754   07/02/19 0600  ceFEPIme (MAXIPIME) 2 g in sodium chloride 0.9 % 100 mL IVPB  Status:  Discontinued     2 g 200 mL/hr over 30 Minutes Intravenous Every 8 hours 07/01/19 2223 07/03/19 1437   07/02/19 0600  metroNIDAZOLE (FLAGYL) IVPB 500 mg  Status:  Discontinued     500 mg 100 mL/hr over 60 Minutes Intravenous Every 8 hours 07/01/19 2350 07/05/19 1226   07/01/19 2130  vancomycin (VANCOREADY) IVPB 1750 mg/350 mL     1,750 mg 175 mL/hr over 120 Minutes Intravenous  Once 07/01/19 2115 07/02/19 0235   07/01/19 2115  ceFEPIme (MAXIPIME) 2 g in sodium chloride 0.9 % 100 mL IVPB  Status:  Discontinued     2 g 200 mL/hr over 30 Minutes Intravenous Every 12 hours 07/01/19 2109 07/01/19 2223   07/01/19 2115  metroNIDAZOLE (FLAGYL) IVPB 500 mg     500 mg 100 mL/hr over 60 Minutes Intravenous  Once 07/01/19 2109 07/02/19 0025   07/01/19 2115  vancomycin  (VANCOCIN) IVPB 1000 mg/200 mL premix  Status:  Discontinued     1,000 mg 200 mL/hr over 60 Minutes Intravenous  Once 07/01/19 2109 07/01/19 2115       I have personally reviewed the following labs and images: CBC: Recent Labs  Lab 07/05/19 0315 07/08/19 0810 07/10/19 0103  WBC 7.9 5.5 6.6  HGB 13.4 12.6* 12.6*  HCT 39.5 37.4* 37.2*  MCV 99.7 100.5* 99.7  PLT 104* 112* 123*   BMP &GFR Recent Labs  Lab 07/05/19 0315 07/08/19 0810 07/10/19 0103  NA 138 144 144  K 4.2 3.5 3.6  CL 108 109 111  CO2 22 25 23   GLUCOSE 89 94 87  BUN 17 17 15   CREATININE 0.83 0.97 0.78  CALCIUM 7.9* 8.4* 8.3*  MG  --  1.7 1.5*  PHOS  --  2.4* 1.9*   Estimated Creatinine Clearance: 80.1 mL/min (by C-G formula based on SCr of 0.78 mg/dL). Liver & Pancreas: Recent Labs  Lab 07/05/19 0315 07/08/19 0810  AST 45*  --   ALT 22  --   ALKPHOS 54  --   BILITOT 2.2*  --   PROT 5.4*  --   ALBUMIN 2.2* 2.2*   No results for input(s): LIPASE, AMYLASE in the last 168 hours. Recent Labs  Lab 07/07/19 0948 07/11/19 0345  AMMONIA 40* 30   Diabetic: No results for input(s): HGBA1C in the last 72 hours. No results for input(s): GLUCAP in the last 168 hours. Cardiac Enzymes: No results for input(s): CKTOTAL, CKMB, CKMBINDEX, TROPONINI in the last 168 hours. No results for input(s): PROBNP in the last 8760 hours. Coagulation Profile: No results for input(s): INR, PROTIME in the last 168 hours. Thyroid Function Tests: No results for input(s): TSH, T4TOTAL, FREET4, T3FREE, THYROIDAB in the last 72 hours. Lipid Profile: No results for input(s): CHOL, HDL, LDLCALC, TRIG, CHOLHDL, LDLDIRECT in the last 72 hours. Anemia Panel: No results for input(s): VITAMINB12, FOLATE, FERRITIN, TIBC, IRON, RETICCTPCT in the last 72 hours. Urine analysis:    Component Value Date/Time   COLORURINE AMBER (A) 07/01/2019 2058   APPEARANCEUR CLEAR 07/01/2019 2058   LABSPEC 1.018 07/01/2019 2058   PHURINE 6.0  07/01/2019 2058   [place] NEGATIVE 07/01/2019 2058   HGBUR NEGATIVE 07/01/2019 2058   [place] NEGATIVE 07/01/2019 2058   [place] NEGATIVE 07/01/2019 2058   PROTEINUR NEGATIVE 07/01/2019 2058   UROBILINOGEN 1.0 08/10/2011 1826   NITRITE NEGATIVE 07/01/2019 2058   LEUKOCYTESUR NEGATIVE 07/01/2019 2058   Sepsis Labs: Invalid input(s): PROCALCITONIN, [place]  Microbiology: Recent Results (from the past 240  hour(s))  Culture, blood (Routine x 2)     Status: None   Collection Time: 07/01/19  8:48 PM   Specimen: BLOOD  Result Value Ref Range Status   Specimen Description BLOOD RIGHT FOREARM  Final   Special Requests   Final    BOTTLES DRAWN AEROBIC AND ANAEROBIC Blood Culture adequate volume   Culture   Final    NO GROWTH 5 DAYS Performed at [facility], [address] [place], [place] [address]    Report Status 07/06/2019 FINAL  Final  Culture, blood (Routine x 2)     Status: None   Collection Time: 07/01/19  8:58 PM   Specimen: BLOOD  Result Value Ref Range Status   Specimen Description BLOOD LEFT FOREARM  Final   Special Requests   Final    BOTTLES DRAWN AEROBIC AND ANAEROBIC Blood Culture adequate volume   Culture   Final    NO GROWTH 5 DAYS Performed at [facility], [place] [address], [place], [place] [address]    Report Status 07/06/2019 FINAL  Final  SARS CORONAVIRUS 2 (TAT 6-24 HRS) Nasopharyngeal Nasopharyngeal Swab     Status: None   Collection Time: 07/01/19  9:51 PM   Specimen: Nasopharyngeal Swab  Result Value Ref Range Status   SARS Coronavirus 2 NEGATIVE NEGATIVE Final    Comment: (NOTE) SARS-CoV-2 target nucleic acids are NOT DETECTED. The SARS-CoV-2 RNA is generally detectable in upper and lower respiratory specimens during the acute phase of infection. Negative results do not preclude SARS-CoV-2 infection, do not rule out co-infections with other pathogens, and should not be used as the sole basis for treatment or other patient  management decisions. Negative results must be combined with clinical observations, patient history, and epidemiological information. The expected result is Negative. Fact Sheet for Patients: SugarRoll.be Fact Sheet for Healthcare Providers: https://www.woods-mathews.com/ This test is not yet approved or cleared by the [place] FDA and  has been authorized for detection and/or diagnosis of SARS-CoV-2 by FDA under an Emergency Use Authorization (EUA). This EUA will remain  in effect (meaning this test can be used) for the duration of the COVID-19 declaration under Section 56 4(b)(1) of the Act, 21 U.S.C. section 360bbb-3(b)(1), unless the authorization is terminated or revoked sooner. Performed at [facility], [place] [address] [place], [place] [address]   Body fluid culture     Status: None   Collection Time: 07/04/19  2:20 PM   Specimen: Neck; Body Fluid  Result Value Ref Range Status   Specimen Description NECK  Final   Special Requests NONE  Final   Gram Stain   Final    RARE WBC PRESENT,BOTH PMN AND MONONUCLEAR RARE GRAM POSITIVE COCCI Performed at [facility], [place] [address], [place],  [address]    Culture RARE STAPHYLOCOCCUS EPIDERMIDIS  Final   Report Status 07/08/2019 FINAL  Final   Organism ID, Bacteria STAPHYLOCOCCUS EPIDERMIDIS  Final      Susceptibility   Staphylococcus epidermidis - MIC*    CIPROFLOXACIN <=0.5 SENSITIVE Sensitive     ERYTHROMYCIN <=0.25 SENSITIVE Sensitive     GENTAMICIN <=0.5 SENSITIVE Sensitive     OXACILLIN <=0.25 SENSITIVE Sensitive     TETRACYCLINE <=1 SENSITIVE Sensitive     VANCOMYCIN 2 SENSITIVE Sensitive     TRIMETH/SULFA <=10 SENSITIVE Sensitive     CLINDAMYCIN <=0.25 SENSITIVE Sensitive     RIFAMPIN <=0.5 SENSITIVE Sensitive     Inducible Clindamycin NEGATIVE Sensitive     *  RARE STAPHYLOCOCCUS EPIDERMIDIS  MRSA PCR Screening     Status: None   Collection Time:  07/05/19 11:18 AM   Specimen: Nasal Mucosa; Nasopharyngeal  Result Value Ref Range Status   MRSA by PCR NEGATIVE NEGATIVE Final    Comment:        The GeneXpert MRSA Assay (FDA approved for NASAL specimens only), is one component of a comprehensive MRSA colonization surveillance program. It is not intended to diagnose MRSA infection nor to guide or monitor treatment for MRSA infections. Performed at [facility], [place] [address] [place], [place] [address]     Radiology Studies: [place] CORE BIOPSY (LYMPH NODES)  Result Date: 07/10/2019 INDICATION: Indeterminate right-sided neck mass, potentially associated with the right lobe of the thyroid with associated right cervical lymphadenopathy. Patient was admitted with concern for infection, however FNA biopsy of the dominant right-sided neck mass demonstrated atypia. As such, patient returns today for ultrasound-guided core needle biopsy of right-sided neck mass as well as ultrasound-guided core needle biopsy of right cervical lymph node for tissue diagnostic purposes. EXAM: 1. ULTRASOUND-GUIDED RIGHT NECK MASS CORE NEEDLE BIOPSY 2. ULTRASOUND-GUIDED RIGHT CERVICAL LYMPH NODE CORE NEEDLE BIOPSY COMPARISON:  Neck CT-07/03/2019; ultrasound-guided right neck mass fine-needle aspiration-07/04/2019 MEDICATIONS: None ANESTHESIA/SEDATION: Moderate (conscious) sedation was employed during this procedure. A total of Versed 0.5 mg and Fentanyl 25 mcg was administered intravenously. Moderate Sedation Time: 14 minutes. The patient's level of consciousness and vital signs were monitored continuously by radiology nursing throughout the procedure under my direct supervision. COMPLICATIONS: None immediate. TECHNIQUE: Informed written consent was obtained from the patient after a discussion of the risks, benefits and alternatives to treatment. Questions regarding the procedure were encouraged and answered. Initial ultrasound scanning demonstrated grossly unchanged  size and appearance of the at least 3.2 x 3.0 cm right sided neck mass, potentially associated with the right lobe of the thyroid (image 4). Additionally, note is made of an approximately 1.3 x 1.0 cm right cervical lymph node likely correlating with the dominant cervical lymph node seen on preceding neck CT image 58, series 3. Multiple ultrasound images were saved procedural documentation purposes. The procedure was planned. A timeout was performed prior to the initiation of the procedure. The operative sites were was prepped and draped in the usual sterile fashion, and a sterile drape was applied covering the operative field. A timeout was performed prior to the initiation of the procedure. Local anesthesia was provided with 1% lidocaine with epinephrine. Initially beginning with the dominant right cervical lymph node, an 18 gauge core needle biopsy device was utilized to obtain a 4 core needle biopsies under direct ultrasound guidance. Next, a separate 18 gauge core needle biopsy device was utilized to obtain 5 core needle biopsies of the dominant right neck mass. The samples were placed in saline and submitted separate to pathology. Superficial hemostasis was achieved with manual compression. Post procedure scan was negative for significant hematoma. Dressings were applied. The patient tolerated the procedure well without immediate postprocedural complication. IMPRESSION: 1. Technically successful ultrasound guided biopsy of dominant right cervical lymph node. 2. Technically successful ultrasound-guided biopsy of right-sided neck mass, potentially associated with the right lobe of the thyroid. Electronically Signed   By: Sandi Mariscal M.D.   On: 07/10/2019 16:38   [place] CORE BIOPSY (SOFT TISSUE)  Result Date: 07/10/2019 INDICATION: Indeterminate right-sided neck mass, potentially associated with the right lobe of the thyroid with associated right cervical lymphadenopathy. Patient was admitted with concern for  infection, however FNA  biopsy of the dominant right-sided neck mass demonstrated atypia. As such, patient returns today for ultrasound-guided core needle biopsy of right-sided neck mass as well as ultrasound-guided core needle biopsy of right cervical lymph node for tissue diagnostic purposes. EXAM: 1. ULTRASOUND-GUIDED RIGHT NECK MASS CORE NEEDLE BIOPSY 2. ULTRASOUND-GUIDED RIGHT CERVICAL LYMPH NODE CORE NEEDLE BIOPSY COMPARISON:  Neck CT-07/03/2019; ultrasound-guided right neck mass fine-needle aspiration-07/04/2019 MEDICATIONS: None ANESTHESIA/SEDATION: Moderate (conscious) sedation was employed during this procedure. A total of Versed 0.5 mg and Fentanyl 25 mcg was administered intravenously. Moderate Sedation Time: 14 minutes. The patient's level of consciousness and vital signs were monitored continuously by radiology nursing throughout the procedure under my direct supervision. COMPLICATIONS: None immediate. TECHNIQUE: Informed written consent was obtained from the patient after a discussion of the risks, benefits and alternatives to treatment. Questions regarding the procedure were encouraged and answered. Initial ultrasound scanning demonstrated grossly unchanged size and appearance of the at least 3.2 x 3.0 cm right sided neck mass, potentially associated with the right lobe of the thyroid (image 4). Additionally, note is made of an approximately 1.3 x 1.0 cm right cervical lymph node likely correlating with the dominant cervical lymph node seen on preceding neck CT image 58, series 3. Multiple ultrasound images were saved procedural documentation purposes. The procedure was planned. A timeout was performed prior to the initiation of the procedure. The operative sites were was prepped and draped in the usual sterile fashion, and a sterile drape was applied covering the operative field. A timeout was performed prior to the initiation of the procedure. Local anesthesia was provided with 1% lidocaine with  epinephrine. Initially beginning with the dominant right cervical lymph node, an 18 gauge core needle biopsy device was utilized to obtain a 4 core needle biopsies under direct ultrasound guidance. Next, a separate 18 gauge core needle biopsy device was utilized to obtain 5 core needle biopsies of the dominant right neck mass. The samples were placed in saline and submitted separate to pathology. Superficial hemostasis was achieved with manual compression. Post procedure scan was negative for significant hematoma. Dressings were applied. The patient tolerated the procedure well without immediate postprocedural complication. IMPRESSION: 1. Technically successful ultrasound guided biopsy of dominant right cervical lymph node. 2. Technically successful ultrasound-guided biopsy of right-sided neck mass, potentially associated with the right lobe of the thyroid. Electronically Signed   By: Sandi Mariscal M.D.   On: 07/10/2019 16:38     Kendarious Gudino T. [place]  If 7PM-7AM, please contact night-coverage [email or web address] Password [email or web address] 07/11/2019, 1:14 PM

## 2019-07-11 NOTE — Progress Notes (Signed)
Occupational Therapy Treatment Patient Details Name: Glen Finley MRN: [id number] DOB: [birth year] Today's Date: 07/11/2019    History of present illness Jamonta Goerner Godman is an 85 y.o. 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  He was recently admitted to the hospital and discharged 06/30/19 after being treated for acute hepatic encephalopathy.   OT comments  Pt making progress in therapy, demonstrating improved independence with self-care tasks. Continued education with pt on safety strategies and activity pacing to increase balance and prevent future falls with poor to fair understanding and follow through. Educated pt on body positioning and hand placement for functional transfer tasks with RW noting fair understanding and follow through. Pt able to ambulate to/from bathroom with RW and min assist for balance and safety. Pt required max cues to slow pace and maintain body positioning inside RW as pt continually attempted to ambulate out on right side of RW. Increased difficulty maintaining inside RW when turning. Pt completed toileting task requiring cues for safety. Pt tolerated standing ~2 min at the sink to wash hands with min assist for balance. No signs/symptoms of distress. Pt's wife present for session. OT will continue to follow acutely. Continue to recommend CIR placement for additional rehab prior to discharge home.    Follow Up Recommendations  CIR;Supervision/Assistance - 24 hour    Equipment Recommendations  3 in 1 bedside commode    Recommendations for Other Services      Precautions / Restrictions Precautions Precautions: Fall Restrictions Weight Bearing Restrictions: No       Mobility Bed Mobility Overal  bed mobility: Needs Assistance Bed Mobility: Supine to Sit     Supine to sit: HOB elevated;Supervision     General bed mobility comments: To ensure balance and safety  Transfers Overall transfer level: Needs assistance Equipment used: Rolling walker (2 wheeled) Transfers: Sit to/from Stand Sit to Stand: Min assist         General transfer comment: Assist to power up into standing. Cues for hand placement and body positioning with RW.     Balance Overall balance assessment: Needs assistance Sitting-balance support: Feet supported;No upper extremity supported Sitting balance-Leahy Scale: Fair     Standing balance support: Bilateral upper extremity supported;During functional activity Standing balance-Leahy Scale: Poor                             ADL either performed or assessed with clinical judgement   ADL Overall ADL's : Needs assistance/impaired     Grooming: Wash/dry hands;Standing;Minimal assistance Grooming Details (indicate cue type and reason): While standing at the sink. Min assist for balance and safety.                  Toilet Transfer: Minimal assistance;Ambulation;Regular Toilet;Grab bars;RW [employer] forces technical [employer] Details (indicate cue type and reason): Pt able to ambulate to/from bathroom with RW and min assist for balance and safety with RW. Pt required max cues on body positioning with RW to increase balance and safety.  Toileting- Clothing Manipulation and Hygiene: Minimal assistance;Sit to/from stand Toileting - Clothing Manipulation Details (indicate cue type and reason): Assist for clothing management and balance     Functional mobility during ADLs: Minimal assistance;Rolling walker;Cueing for safety General ADL Comments: Pt able to ambulate  to/from bathroom with RW and min assist for balance and safety with RW. Pt tolerated standing additional 2 min at the sink to wash hands requiring min assist for balance.      Vision        Perception     Praxis      Cognition Arousal/Alertness: Awake/alert Behavior During Therapy: WFL for tasks assessed/performed Overall Cognitive Status: History of cognitive impairments - at baseline                                 General Comments: Pt pleasant and willing to participate in therapy. Pt able to follow ~50% of commands with increased time and repeat of instructions. Pt required mod to max cues for safety with RW.         Exercises     Shoulder Instructions       General Comments Pt's wife present for session. No signs/symptoms of distress.     Pertinent Vitals/ Pain       Pain Assessment: No/denies pain  Home Living                                          Prior Functioning/Environment              Frequency           Progress Toward Goals  OT Goals(current goals can now be found in the care plan section)  Progress towards OT goals: Progressing toward goals  ADL Goals Pt Will Perform Lower Body Dressing: with modified independence;sit to/from stand;sitting/lateral leans Additional ADL Goal #1: Pt will participate in 15 mins of ADL tasks with modified independence with 2 seated rest breaks in order to increase activity tolerance Additional ADL Goal #2: Pt will avoid obstacles on L side with minimal cues to attend to L side with ambulation and functional tasks.  Plan Discharge plan remains appropriate    Co-evaluation                 AM-PAC OT "6 Clicks" Daily Activity     Outcome Measure   Help from another person eating meals?: None Help from another person taking care of personal grooming?: A Little Help from another person toileting, which includes using toliet, bedpan, or urinal?: A Little Help from another person bathing (including washing, rinsing, drying)?: A Little Help from another person to put on and taking off regular upper body clothing?: A Little Help from another person to put on and  taking off regular lower body clothing?: A Little 6 Click Score: 19    End of Session Equipment Utilized During Treatment: Gait belt;Rolling walker  OT Visit Diagnosis: Unsteadiness on feet (R26.81);Muscle weakness (generalized) (M62.81)   Activity Tolerance Patient tolerated treatment well   Patient Left in chair;with call bell/phone within reach;with family/visitor present   Nurse Communication Mobility status        Time: 3244-0102 OT Time Calculation (min): 24 min  Charges: OT General Charges $OT Visit: 1 Visit OT Treatments $Self Care/Home Management : 8-22 mins $Therapeutic Activity: 8-22 mins  Mauri Brooklyn OTR/L [phone]   Mauri Brooklyn 07/11/2019, 2:01 PM

## 2019-07-12 ENCOUNTER — Inpatient Hospital Stay (HOSPITAL_COMMUNITY): Payer: Medicare Other

## 2019-07-12 LAB — BASIC METABOLIC PANEL
Anion gap: 9 (ref 5–15)
BUN: 14 mg/dL (ref 8–23)
CO2: 23 mmol/L (ref 22–32)
Calcium: 8.3 mg/dL — ABNORMAL LOW (ref 8.9–10.3)
Chloride: 105 mmol/L (ref 98–111)
Creatinine, Ser: 0.93 mg/dL (ref 0.61–1.24)
GFR calc Af Amer: >60 mL/min (ref >60)
GFR calc non Af Amer: >60 mL/min (ref >60)
Glucose, Bld: 94 mg/dL (ref 70–99)
Potassium: 3.4 mmol/L — ABNORMAL LOW (ref 3.5–5.1)
Sodium: 137 mmol/L (ref 135–145)

## 2019-07-12 LAB — CBC
HCT: 36.2 % — ABNORMAL LOW (ref 39.0–52.0)
Hemoglobin: 12.8 g/dL — ABNORMAL LOW (ref 13.0–17.0)
MCH: 34.5 pg — ABNORMAL HIGH (ref 26.0–34.0)
MCHC: 35.4 g/dL (ref 30.0–36.0)
MCV: 97.6 fL (ref 80.0–100.0)
Platelets: 63 10*3/uL — ABNORMAL LOW (ref 150–400)
RBC: 3.71 MIL/uL — ABNORMAL LOW (ref 4.22–5.81)
RDW: 15.8 % — ABNORMAL HIGH (ref 11.5–15.5)
WBC: 5.1 10*3/uL (ref 4.0–10.5)
nRBC: 0 % (ref 0.0–0.2)

## 2019-07-12 LAB — PHOSPHORUS: Phosphorus: 2.6 mg/dL (ref 2.5–4.6)

## 2019-07-12 LAB — SURGICAL PATHOLOGY

## 2019-07-12 LAB — MAGNESIUM: Magnesium: 1.5 mg/dL — ABNORMAL LOW (ref 1.7–2.4)

## 2019-07-12 MED ORDER — MAGNESIUM SULFATE 2 GM/50ML IV SOLN: 2.0000 g | Freq: Once | INTRAVENOUS | Status: DC

## 2019-07-12 MED ORDER — IOHEXOL 300 MG/ML  SOLN
75.0000 mL | Freq: Once | INTRAMUSCULAR | Status: AC | PRN
  Administered 2019-07-12: 75 mL via INTRAVENOUS

## 2019-07-12 MED ORDER — MAGNESIUM OXIDE 400 (241.3 MG) MG PO TABS
400.0000 mg | ORAL_TABLET | Freq: Two times a day (BID) | ORAL | Status: DC
  Administered 2019-07-12 – 2019-07-14 (×4): 400 mg via ORAL
  Filled 2019-07-12 (×4): qty 1

## 2019-07-12 MED ORDER — POTASSIUM CHLORIDE CRYS ER 20 MEQ PO TBCR
40.0000 meq | EXTENDED_RELEASE_TABLET | Freq: Once | ORAL | Status: AC
  Administered 2019-07-12: 40 meq via ORAL
  Filled 2019-07-12: qty 2

## 2019-07-12 NOTE — Progress Notes (Addendum)
Saw pt and wife at bedside. Updated that insurance authorization has not been determined and there are no available beds in CIR today.  Will f/u with pt/wife at later date.  Gayland Curry, [place], [place] Admissions Coordinator [phone]

## 2019-07-12 NOTE — Progress Notes (Signed)
PROGRESS NOTE    Glen Finley Summer  EPP:[id number] DOB: Oct 10, 1934 DOA: 07/01/2019 PCP: Shon Baton, MD   Chief Complaint  Patient presents with  . Fever  . Altered Mental Status    Brief Narrative:  85 year old male with history of myasthenia gravis/progressive gait disorder not on meds, PD vs Lewy body disease, bipolar disorder, migraine, diastolic CHF, HTN, HLD, cirrhosis, transaminitis and fatty liver disease brought to ED by EMS for fever and altered mental status and admitted for sepsis.  He was started on broad-spectrum antibiotics.  Later in the course, he was diagnosed with retropharyngeal and right neck abscess.    MRI cervical spine on 4/11 revealed abnormal retropharyngeal/prevertebral edema with associated phlegmonous change within the lower right neck concerning for acute infection with probable superimposed 2 cm abscess.  CT soft tissue neck with contrast on 4/12 redemonstrated abnormal prevertebral/retropharyngeal fluid or edema extending from the C1-C2 level inferiorly to the C7 level. Extensive inflammatory stranding and phlegmonous change extending into the right neck soft tissues centered at the C5 level. Findings suspicious for an associated 3.0 x 2.0 abscess along the right posterior aspect of the thyroid cartilage at the C5 level. Heterogeneous enhancement of the right thyroid lobe suspicious for secondary thyroiditis  ENT, ID and IR consulted.  Patient had US guided FNA of right neck mass by IR on 4/13.  Culture with rare GPC.  Blood cultures negative so far.  He was on vancomycin and ceftriaxone, and transitioned to Unasyn on 4/14 .  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  Oncology and ENT recommended repeat biopsy which was done on 4/19.  Pathology pending.  ID recommended discontinuing antibiotic if repeat biopsy confirms malignancy.  Evaluated by PT/OT who recommended CIR.  Waiting on insurance authorization and CIR bed.  Assessment & Plan:   Principal  Problem:   Retropharyngeal abscess Active Problems:   Hypertension   Myasthenia gravis without exacerbation ([place])   Acute hepatic encephalopathy   Sepsis ([name])   Liver cirrhosis ([name])  Right neck mass  Metastatic Poorly Differentiated Carcinoma: Cytology from [place] FNA by IR on 4/13 with malignant cells.  - Pathology from 4/19 with metastatic poorly differentiated carcinoma - CT abd/pelvis on admission without malignancy.  Will follow CT chest with contrast. - Will discuss with oncology - Dr. Blenda Nicely from ENT following   Sepsis, secondary to retropharyngeal infection/right neck abscess-clinically improving. Blood cultures and MRSA PCR negative. MRI cervical spine w/wo contrast and CT soft tissue of neck with contrast as above. Tissue cultures from 4/13 with staph epidermis.  Initial cytology with malignant cells.   -Follow pathology from repeat biopsy -Vancomycin and ceftriaxone>>>Unasyn.  Will stop abx given malignancy on repeat biopsy.   Acute respiratory failure with hypoxia-likely due to the above.  Resolved.  On room air. -Encourage incentive spirometry and OOB  Acute hepatic/metabolic encephalopathy, fatty liver disease, cirrhosis: Ammonia 92 on admit> 40> 30.  Improved.  He is oriented x4 except to date. -Continue lactulose to 30 mg 3 times daily and titrate for 2-3 BM Chenay Nesmith day. -Frequent orientation delirium precautions.  Chronic diastolic heart failure: Stable -Resume home Lasix. -Monitor intake and output  Myasthenia gravis, progressive gait disorder -Follow-up with neurology outpatient -PT/OT-recommended CIR.  CIR following.  -Plan for CIR when bed available  Essential hypertension: Blood pressure elevated. -Continue lisinopril, atenolol and hydralazine -Increase as needed hydralazine to 10 mg -Resume home Lasix  Depression: Stable -Continue amitriptyline, lexapro   Thrombocytopenia: Likely due to cirrhosis.  Appears to fluctuate.  Decreased today to  60's. -Continue monitoring  DVT prophylaxis: Scd Code Status: full Family Communication: none at bedside Disposition:   Status is: Inpatient  Remains inpatient appropriate because:Inpatient level of care appropriate due to severity of illness   Dispo: The patient is from: Home              Anticipated d/c is to: CIR              Anticipated d/c date is: 1 day              Patient currently is not medically stable to d/c.   Consultants:   Oncology  ENT  ID  IR  Procedures:  FNA biopsy 4/13 [place] core biopsy 4/19  Antimicrobials:  Anti-infectives (From admission, onward)   Start     Dose/Rate Route Frequency Ordered Stop   07/07/19 0000  amoxicillin-clavulanate (AUGMENTIN) 875-125 MG tablet     1 tablet Oral 2 times daily 07/07/19 0940 07/28/19 2359   07/05/19 1300  Ampicillin-Sulbactam (UNASYN) 3 g in sodium chloride 0.9 % 100 mL IVPB  Status:  Discontinued     3 g 200 mL/hr over 30 Minutes Intravenous Every 6 hours 07/05/19 1226 07/12/19 1643   07/04/19 2300  vancomycin (VANCOREADY) IVPB 1750 mg/350 mL  Status:  Discontinued     1,750 mg 175 mL/hr over 120 Minutes Intravenous Every 24 hours 07/04/19 0754 07/05/19 1226   07/03/19 1600  cefTRIAXone (ROCEPHIN) 2 g in sodium chloride 0.9 % 100 mL IVPB  Status:  Discontinued     2 g 200 mL/hr over 30 Minutes Intravenous Every 24 hours 07/03/19 1437 07/05/19 1226   07/02/19 2300  vancomycin (VANCOREADY) IVPB 1500 mg/300 mL  Status:  Discontinued     1,500 mg 150 mL/hr over 120 Minutes Intravenous Every 24 hours 07/01/19 2223 07/04/19 0754   07/02/19 0600  ceFEPIme (MAXIPIME) 2 g in sodium chloride 0.9 % 100 mL IVPB  Status:  Discontinued     2 g 200 mL/hr over 30 Minutes Intravenous Every 8 hours 07/01/19 2223 07/03/19 1437   07/02/19 0600  metroNIDAZOLE (FLAGYL) IVPB 500 mg  Status:  Discontinued     500 mg 100 mL/hr over 60 Minutes Intravenous Every 8 hours 07/01/19 2350 07/05/19 1226   07/01/19 2130  vancomycin  (VANCOREADY) IVPB 1750 mg/350 mL     1,750 mg 175 mL/hr over 120 Minutes Intravenous  Once 07/01/19 2115 07/02/19 0235   07/01/19 2115  ceFEPIme (MAXIPIME) 2 g in sodium chloride 0.9 % 100 mL IVPB  Status:  Discontinued     2 g 200 mL/hr over 30 Minutes Intravenous Every 12 hours 07/01/19 2109 07/01/19 2223   07/01/19 2115  metroNIDAZOLE (FLAGYL) IVPB 500 mg     500 mg 100 mL/hr over 60 Minutes Intravenous  Once 07/01/19 2109 07/02/19 0025   07/01/19 2115  vancomycin (VANCOCIN) IVPB 1000 mg/200 mL premix  Status:  Discontinued     1,000 mg 200 mL/hr over 60 Minutes Intravenous  Once 07/01/19 2109 07/01/19 2115      Subjective: No new complaitns, feeling better  Objective: Vitals:   07/12/19 0556 07/12/19 0821 07/12/19 1225 07/12/19 1618  BP:  (!) 136/55 (!) 159/65 (!) 144/53  Pulse:  65 64   Resp:   20   Temp:  97.9 F (36.6 C) 98.2 F (36.8 C)   TempSrc:  Oral Oral   SpO2:  95% 96%   Weight: 92.2 kg  Intake/Output Summary (Last 24 hours) at 07/12/2019 1640 Last data filed at 07/12/2019 0615 Gross per 24 hour  Intake 200 ml  Output 800 ml  Net -600 ml   Filed Weights   07/10/19 0402 07/11/19 0500 07/12/19 0556  Weight: 93.2 kg 96.2 kg 92.2 kg    Examination:  General exam: Appears calm and comfortable  Respiratory system: Clear to auscultation. Respiratory effort normal. Cardiovascular system: S1 & S2 heard, RRR. Gastrointestinal system: Abdomen is nondistended, soft and nontender. Central nervous system: Alert and oriented. No focal neurological deficits. Extremities: no LEE Skin: No rashes, lesions or ulcers Psychiatry: Judgement and insight appear normal. Mood & affect appropriate.     Data Reviewed: I have personally reviewed following labs and imaging studies  CBC: Recent Labs  Lab 07/08/19 0810 07/10/19 0103 07/12/19 0023  WBC 5.5 6.6 5.1  HGB 12.6* 12.6* 12.8*  HCT 37.4* 37.2* 36.2*  MCV 100.5* 99.7 97.6  PLT 112* 123* 63*    Basic  Metabolic Panel: Recent Labs  Lab 07/08/19 0810 07/10/19 0103 07/12/19 0023  NA 144 144 137  K 3.5 3.6 3.4*  CL 109 111 105  CO2 25 23 23   GLUCOSE 94 87 94  BUN 17 15 14   CREATININE 0.97 0.78 0.93  CALCIUM 8.4* 8.3* 8.3*  MG 1.7 1.5* 1.5*  PHOS 2.4* 1.9* 2.6    GFR: Estimated Creatinine Clearance: 67.5 mL/min (by C-G formula based on SCr of 0.93 mg/dL).  Liver Function Tests: Recent Labs  Lab 07/08/19 0810  ALBUMIN 2.2*    CBG: No results for input(s): GLUCAP in the last 168 hours.   Recent Results (from the past 240 hour(s))  Body fluid culture     Status: None   Collection Time: 07/04/19  2:20 PM   Specimen: Neck; Body Fluid  Result Value Ref Range Status   Specimen Description NECK  Final   Special Requests NONE  Final   Gram Stain   Final    RARE WBC PRESENT,BOTH PMN AND MONONUCLEAR RARE GRAM POSITIVE COCCI Performed at [facility], [place] [address], [place], [place] [address]    Culture RARE STAPHYLOCOCCUS EPIDERMIDIS  Final   Report Status 07/08/2019 FINAL  Final   Organism ID, Bacteria STAPHYLOCOCCUS EPIDERMIDIS  Final      Susceptibility   Staphylococcus epidermidis - MIC*    CIPROFLOXACIN <=0.5 SENSITIVE Sensitive     ERYTHROMYCIN <=0.25 SENSITIVE Sensitive     GENTAMICIN <=0.5 SENSITIVE Sensitive     OXACILLIN <=0.25 SENSITIVE Sensitive     TETRACYCLINE <=1 SENSITIVE Sensitive     VANCOMYCIN 2 SENSITIVE Sensitive     TRIMETH/SULFA <=10 SENSITIVE Sensitive     CLINDAMYCIN <=0.25 SENSITIVE Sensitive     RIFAMPIN <=0.5 SENSITIVE Sensitive     Inducible Clindamycin NEGATIVE Sensitive     * RARE STAPHYLOCOCCUS EPIDERMIDIS  MRSA PCR Screening     Status: None   Collection Time: 07/05/19 11:18 AM   Specimen: Nasal Mucosa; Nasopharyngeal  Result Value Ref Range Status   MRSA by PCR NEGATIVE NEGATIVE Final    Comment:        The GeneXpert MRSA Assay (FDA approved for NASAL specimens only), is one component of [facility] comprehensive MRSA  colonization surveillance program. It is not intended to diagnose MRSA infection nor to guide or monitor treatment for MRSA infections. Performed at [facility], [place] [address] [place], [place] [address]          Radiology Studies: No  results found.      Scheduled Meds: . amitriptyline  25 mg Oral QHS  . atenolol  25 mg Oral q AM  . escitalopram  10 mg Oral q AM  . furosemide  20 mg Oral Daily  . hydrALAZINE  50 mg Oral Q8H  . lactulose  30 g Oral TID  . lisinopril  20 mg Oral BID  . sodium chloride flush  10-40 mL Intracatheter Q12H   Continuous Infusions: . ampicillin-sulbactam (UNASYN) IV 3 g (07/12/19 1629)     LOS: 11 days    Time spent: over 30 min    Fayrene Helper, MD [facility]   To contact the attending provider between 7A-7P or the covering provider during after hours 7P-7A, please log into the web site [email or web address] and access using universal [place] password for that web site. If you do not have the password, please call the hospital operator.  07/12/2019, 4:40 PM

## 2019-07-12 NOTE — Progress Notes (Signed)
Physical Therapy Treatment Patient Details Name: Glen Finley MRN: [id number] DOB: 1934/12/13 Today's Date: 07/12/2019    History of Present Illness Glen Finley is an 85 y.o. 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  He was recently admitted to the hospital and discharged 06/30/19 after being treated for acute hepatic encephalopathy.    PT Comments    Patient progressing with ambulation able to turn walker today with cues only and stayed inside.  Still needs assist in crowded room with maneuvering around obstacles and increased time with cues around housekeeping carts in hallway.  He remembered prior session with instructions for staying in the walker as well as that he was returning to room number 08.  Wife present and supportive and able to supervise at home.  Continue to recommend CIR level rehab at d/c.   Follow Up Recommendations  CIR;Supervision/Assistance - 24 hour     Equipment Recommendations  Wheelchair (measurements PT)    Recommendations for Other Services       Precautions / Restrictions Precautions Precautions: Fall    Mobility  Bed Mobility Overal bed mobility: Needs Assistance Bed Mobility: Supine to Sit     Supine to sit: HOB elevated;Supervision     General bed mobility comments: To ensure balance and safety  Transfers Overall transfer level: Needs assistance Equipment used: Rolling walker (2 wheeled);None Transfers: Sit to/from [employer] to Stand: Min assist Stand pivot transfers: Min assist       General transfer comment: initially no device noted legs braced against edge of bed, pivot to BSC min A for balance and safety, cues for reaching for armrests, etc  still slight posterior bias  Ambulation/Gait Ambulation/Gait assistance: Min guard Gait Distance (Feet): 180 Feet Assistive device: Rolling walker (2 wheeled) Gait Pattern/deviations: Step-through pattern;Decreased stride length;Drifts right/left     General Gait Details: assist for safety, cues for safe walker management   Stairs             Wheelchair Mobility    Modified Rankin (Stroke Patients Only)       Balance Overall balance assessment: Needs assistance Sitting-balance support: Feet unsupported;Feet supported Sitting balance-Leahy Scale: Fair     Standing balance support: Bilateral upper extremity supported;During functional activity;No upper extremity supported Standing balance-Leahy Scale: Poor Standing balance comment: min A without device, S to minguard with walker for balance during hygiene after on BSC                            Cognition Arousal/Alertness: Awake/alert Behavior During Therapy: WFL for tasks assessed/performed Overall Cognitive Status: History of cognitive impairments - at baseline                                 General Comments: cues for safety, waiting for enviornment set up, soiled with urine in bed due to leaking primo fit pure wick      Exercises      General Comments General comments (skin integrity, edema, etc.): patient's wife present, they had called already for assist due to urine soaked bed, but no one had come yet, assist for hygiene and changed linens      Pertinent Vitals/Pain  Pain Assessment: No/denies pain    Home Living                      Prior Function            PT Goals (current goals can now be found in the care plan section) Progress towards PT goals: Progressing toward goals    Frequency    Min 3X/week      PT Plan Current plan remains appropriate    Co-evaluation              AM-PAC PT "6 Clicks" Mobility   Outcome Measure  Help needed  turning from your back to your side while in a flat bed without using bedrails?: A Little Help needed moving from lying on your back to sitting on the side of a flat bed without using bedrails?: A Little Help needed moving to and from a bed to a chair (including a wheelchair)?: A Little Help needed standing up from a chair using your arms (e.g., wheelchair or bedside chair)?: A Little Help needed to walk in hospital room?: A Little Help needed climbing 3-5 steps with a railing? : A Lot 6 Click Score: 17    End of Session   Activity Tolerance: Patient tolerated treatment well Patient left: in chair;with call bell/phone within reach;with family/visitor present Nurse Communication: Other (comment)(needs new catheter) PT Visit Diagnosis: Other symptoms and signs involving the nervous system (R29.898);Muscle weakness (generalized) (M62.81)     Time: 1791-5056 PT Time Calculation (min) (ACUTE ONLY): 25 min  Charges:  $Gait Training: 8-22 mins $Therapeutic Activity: 8-22 mins                     Magda Kiel, [place] [phone] 07/12/2019    [name] 07/12/2019, 4:39 PM

## 2019-07-13 ENCOUNTER — Encounter (HOSPITAL_COMMUNITY): Payer: Self-pay | Admitting: Internal Medicine

## 2019-07-13 ENCOUNTER — Inpatient Hospital Stay (HOSPITAL_COMMUNITY): Payer: Medicare Other

## 2019-07-13 DIAGNOSIS — R609 Edema, unspecified: Secondary | ICD-10-CM

## 2019-07-13 LAB — CBC WITH DIFFERENTIAL/PLATELET
Abs Immature Granulocytes: 0.03 10*3/uL (ref 0.00–0.07)
Basophils Absolute: 0.1 10*3/uL (ref 0.0–0.1)
Basophils Relative: 1 %
Eosinophils Absolute: 0.4 10*3/uL (ref 0.0–0.5)
Eosinophils Relative: 5 %
HCT: 38 % — ABNORMAL LOW (ref 39.0–52.0)
Hemoglobin: 13.1 g/dL (ref 13.0–17.0)
Immature Granulocytes: 0 %
Lymphocytes Relative: 34 %
Lymphs Abs: 2.5 10*3/uL (ref 0.7–4.0)
MCH: 34.2 pg — ABNORMAL HIGH (ref 26.0–34.0)
MCHC: 34.5 g/dL (ref 30.0–36.0)
MCV: 99.2 fL (ref 80.0–100.0)
Monocytes Absolute: 0.9 10*3/uL (ref 0.1–1.0)
Monocytes Relative: 13 %
Neutro Abs: 3.4 10*3/uL (ref 1.7–7.7)
Neutrophils Relative %: 47 %
Platelets: 117 10*3/uL — ABNORMAL LOW (ref 150–400)
RBC: 3.83 MIL/uL — ABNORMAL LOW (ref 4.22–5.81)
RDW: 15.9 % — ABNORMAL HIGH (ref 11.5–15.5)
WBC: 7.4 10*3/uL (ref 4.0–10.5)
nRBC: 0 % (ref 0.0–0.2)

## 2019-07-13 LAB — COMPREHENSIVE METABOLIC PANEL
ALT: 30 U/L (ref 0–44)
AST: 72 U/L — ABNORMAL HIGH (ref 15–41)
Albumin: 2.3 g/dL — ABNORMAL LOW (ref 3.5–5.0)
Alkaline Phosphatase: 73 U/L (ref 38–126)
Anion gap: 8 (ref 5–15)
BUN: 10 mg/dL (ref 8–23)
CO2: 29 mmol/L (ref 22–32)
Calcium: 8.5 mg/dL — ABNORMAL LOW (ref 8.9–10.3)
Chloride: 106 mmol/L (ref 98–111)
Creatinine, Ser: 0.9 mg/dL (ref 0.61–1.24)
GFR calc Af Amer: >60 mL/min (ref >60)
GFR calc non Af Amer: >60 mL/min (ref >60)
Glucose, Bld: 94 mg/dL (ref 70–99)
Potassium: 3.6 mmol/L (ref 3.5–5.1)
Sodium: 143 mmol/L (ref 135–145)
Total Bilirubin: 1.6 mg/dL — ABNORMAL HIGH (ref 0.3–1.2)
Total Protein: 5.9 g/dL — ABNORMAL LOW (ref 6.5–8.1)

## 2019-07-13 LAB — MAGNESIUM: Magnesium: 1.5 mg/dL — ABNORMAL LOW (ref 1.7–2.4)

## 2019-07-13 LAB — PHOSPHORUS: Phosphorus: 2.7 mg/dL (ref 2.5–4.6)

## 2019-07-13 NOTE — Progress Notes (Signed)
Inpatient Rehab Admissions Coordinator:   I have insurance authorization for CIR for this pt; however, I do not have a bed available for this patient today.  Will continue to follow for timing of possible rehab admission pending bed availability.    Shann Medal, PT, DPT Admissions Coordinator [phone] 07/13/19  9:46 AM

## 2019-07-13 NOTE — Progress Notes (Signed)
PROGRESS NOTE    Glen Finley  MEQ:[id number] DOB: 10/19/1934 DOA: 07/01/2019 PCP: Shon Baton, MD   Chief Complaint  Patient presents with  . Fever  . Altered Mental Status    Brief Narrative:  85 year old male with history of myasthenia gravis/progressive gait disorder not on meds, PD vs Lewy body disease, bipolar disorder, migraine, diastolic CHF, HTN, HLD, cirrhosis, transaminitis and fatty liver disease brought to ED by EMS for fever and altered mental status and admitted for sepsis.  He was started on broad-spectrum antibiotics.  Later in the course, he was diagnosed with retropharyngeal and right neck abscess.    MRI cervical spine on 4/11 revealed abnormal retropharyngeal/prevertebral edema with associated phlegmonous change within the lower right neck concerning for acute infection with probable superimposed 2 cm abscess.  CT soft tissue neck with contrast on 4/12 redemonstrated abnormal prevertebral/retropharyngeal fluid or edema extending from the C1-C2 level inferiorly to the C7 level. Extensive inflammatory stranding and phlegmonous change extending into the right neck soft tissues centered at the C5 level. Findings suspicious for an associated 3.0 x 2.0 abscess along the right posterior aspect of the thyroid cartilage at the C5 level. Heterogeneous enhancement of the right thyroid lobe suspicious for secondary thyroiditis  ENT, ID and IR consulted.  Patient had US guided FNA of right neck mass by IR on 4/13.  Culture with rare GPC.  Blood cultures negative so far.  He was on vancomycin and ceftriaxone, and transitioned to Unasyn on 4/14 .  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  Oncology and ENT recommended repeat biopsy which was done on 4/19.  Pathology pending.  ID recommended discontinuing antibiotic if repeat biopsy confirms malignancy.  Evaluated by PT/OT who recommended CIR.  Waiting on insurance authorization and CIR bed.  Assessment & Plan:   Principal  Problem:   Retropharyngeal abscess Active Problems:   Hypertension   Myasthenia gravis without exacerbation ([place])   Acute hepatic encephalopathy   Sepsis ([place])   Liver cirrhosis ([place])  Right neck mass  Metastatic Poorly Differentiated Carcinoma: Cytology from [place] FNA by IR on 4/13 with malignant cells.  - Pathology from 4/19 with metastatic poorly differentiated carcinoma - CT abd/pelvis on admission without malignancy.  CT chest with contrast with slight decrease in size of infitrating soft tissue R neck mass with continued involvement of the R lobe thyroid with retropharngeal and prevertebral soft tissue edema, small bilateral effusions, nonspecific borderline enlarged mediastinal LN's, and cirrhosis. - discussed with oncology, Dr. Irene Limbo to see today - Dr. Blenda Nicely from ENT following  - RUE [place] with swelling, no evidence of DVT (prelim)  Sepsis, secondary to retropharyngeal infection/right neck abscess-clinically improving. Blood cultures and MRSA PCR negative. MRI cervical spine w/wo contrast and CT soft tissue of neck with contrast as above. Tissue cultures from 4/13 with staph epidermis.  Initial cytology with malignant cells.   -Follow pathology from repeat biopsy -Vancomycin and ceftriaxone>>>Unasyn.  Will stop abx given malignancy on repeat biopsy.   Acute respiratory failure with hypoxia-likely due to the above.  Resolved.  On room air. -Encourage incentive spirometry and OOB  Acute hepatic/metabolic encephalopathy, fatty liver disease, cirrhosis: Ammonia 92 on admit> 40> 30.  Improved.  He is oriented x4 except to date. -Continue lactulose to 30 mg 3 times daily and titrate for 2-3 BM Noell Shular day. -Frequent orientation delirium precautions.  Chronic diastolic heart failure: Stable -Resume home Lasix. -Monitor intake and output  Myasthenia gravis, progressive gait disorder -Follow-up with neurology outpatient -PT/OT-recommended  CIR.  CIR following.  -Plan for CIR when bed  available  Essential hypertension: Blood pressure elevated. -Continue lisinopril, atenolol and hydralazine -Increase as needed hydralazine to 10 mg -Resume home Lasix  Depression: Stable -Continue amitriptyline, lexapro   Thrombocytopenia: Likely due to cirrhosis.  Appears to fluctuate widely.  -Continue monitoring  4.6 cm ascending thoracic aortic aneurysm: needs semi annual imaging and CT surgery follow up  DVT prophylaxis: Scd Code Status: full Family Communication: wife at bedside Disposition:   Status is: Inpatient  Remains inpatient appropriate because:Inpatient level of care appropriate due to severity of illness   Dispo: The patient is from: Home              Anticipated d/c is to: CIR              Anticipated d/c date is: 1 day              Patient currently is not medically stable to d/c.   Consultants:   Oncology  ENT  ID  IR  Procedures:  FNA biopsy 4/13 [place] core biopsy 4/19  Antimicrobials:  Anti-infectives (From admission, onward)   Start     Dose/Rate Route Frequency Ordered Stop   07/07/19 0000  amoxicillin-clavulanate (AUGMENTIN) 875-125 MG tablet     1 tablet Oral 2 times daily 07/07/19 0940 07/28/19 2359   07/05/19 1300  Ampicillin-Sulbactam (UNASYN) 3 g in sodium chloride 0.9 % 100 mL IVPB  Status:  Discontinued     3 g 200 mL/hr over 30 Minutes Intravenous Every 6 hours 07/05/19 1226 07/12/19 1643   07/04/19 2300  vancomycin (VANCOREADY) IVPB 1750 mg/350 mL  Status:  Discontinued     1,750 mg 175 mL/hr over 120 Minutes Intravenous Every 24 hours 07/04/19 0754 07/05/19 1226   07/03/19 1600  cefTRIAXone (ROCEPHIN) 2 g in sodium chloride 0.9 % 100 mL IVPB  Status:  Discontinued     2 g 200 mL/hr over 30 Minutes Intravenous Every 24 hours 07/03/19 1437 07/05/19 1226   07/02/19 2300  vancomycin (VANCOREADY) IVPB 1500 mg/300 mL  Status:  Discontinued     1,500 mg 150 mL/hr over 120 Minutes Intravenous Every 24 hours 07/01/19 2223 07/04/19  0754   07/02/19 0600  ceFEPIme (MAXIPIME) 2 g in sodium chloride 0.9 % 100 mL IVPB  Status:  Discontinued     2 g 200 mL/hr over 30 Minutes Intravenous Every 8 hours 07/01/19 2223 07/03/19 1437   07/02/19 0600  metroNIDAZOLE (FLAGYL) IVPB 500 mg  Status:  Discontinued     500 mg 100 mL/hr over 60 Minutes Intravenous Every 8 hours 07/01/19 2350 07/05/19 1226   07/01/19 2130  vancomycin (VANCOREADY) IVPB 1750 mg/350 mL     1,750 mg 175 mL/hr over 120 Minutes Intravenous  Once 07/01/19 2115 07/02/19 0235   07/01/19 2115  ceFEPIme (MAXIPIME) 2 g in sodium chloride 0.9 % 100 mL IVPB  Status:  Discontinued     2 g 200 mL/hr over 30 Minutes Intravenous Every 12 hours 07/01/19 2109 07/01/19 2223   07/01/19 2115  metroNIDAZOLE (FLAGYL) IVPB 500 mg     500 mg 100 mL/hr over 60 Minutes Intravenous  Once 07/01/19 2109 07/02/19 0025   07/01/19 2115  vancomycin (VANCOCIN) IVPB 1000 mg/200 mL premix  Status:  Discontinued     1,000 mg 200 mL/hr over 60 Minutes Intravenous  Once 07/01/19 2109 07/01/19 2115      Subjective: No new complaints  Objective: Vitals:  07/13/19 0340 07/13/19 0341 07/13/19 0826 07/13/19 1159  BP: (!) 165/71 (!) 161/66 (!) 141/63 128/63  Pulse: 73 70 64 60  Resp: (!) 24 (!) 24 (!) 28 20  Temp:   98.1 F (36.7 C) 98.7 F (37.1 C)  TempSrc:   Oral Oral  SpO2: 94% 94% 93% 94%  Weight:        Intake/Output Summary (Last 24 hours) at 07/13/2019 1433 Last data filed at 07/13/2019 0600 Gross per 24 hour  Intake 250 ml  Output 4 ml  Net 246 ml   Filed Weights   07/11/19 0500 07/12/19 0556 07/13/19 0337  Weight: 96.2 kg 92.2 kg 90.4 kg    Examination:  General: No acute distress. HEENT: r sided neck swelling Cardiovascular: Heart sounds show Mecca Barga regular rate, and rhythm.  Lungs: Clear to auscultation bilaterally Abdomen: Soft, nontender, nondistended Neurological: Alert and oriented 3. Moves all extremities 4 with equal strength. Cranial nerves II through XII  grossly intact. Skin: Warm and dry. No rashes or lesions. Extremities: RUE edema    Data Reviewed: I have personally reviewed following labs and imaging studies  CBC: Recent Labs  Lab 07/08/19 0810 07/10/19 0103 07/12/19 0023 07/13/19 0416  WBC 5.5 6.6 5.1 7.4  NEUTROABS  --   --   --  3.4  HGB 12.6* 12.6* 12.8* 13.1  HCT 37.4* 37.2* 36.2* 38.0*  MCV 100.5* 99.7 97.6 99.2  PLT 112* 123* 63* 117*    Basic Metabolic Panel: Recent Labs  Lab 07/08/19 0810 07/10/19 0103 07/12/19 0023 07/13/19 0416  NA 144 144 137 143  K 3.5 3.6 3.4* 3.6  CL 109 111 105 106  CO2 25 23 23 29   GLUCOSE 94 87 94 94  BUN 17 15 14 10   CREATININE 0.97 0.78 0.93 0.90  CALCIUM 8.4* 8.3* 8.3* 8.5*  MG 1.7 1.5* 1.5* 1.5*  PHOS 2.4* 1.9* 2.6 2.7    GFR: Estimated Creatinine Clearance: 69.1 mL/min (by C-G formula based on SCr of 0.9 mg/dL).  Liver Function Tests: Recent Labs  Lab 07/08/19 0810 07/13/19 0416  AST  --  72*  ALT  --  30  ALKPHOS  --  73  BILITOT  --  1.6*  PROT  --  5.9*  ALBUMIN 2.2* 2.3*    CBG: No results for input(s): GLUCAP in the last 168 hours.   Recent Results (from the past 240 hour(s))  Body fluid culture     Status: None   Collection Time: 07/04/19  2:20 PM   Specimen: Neck; Body Fluid  Result Value Ref Range Status   Specimen Description NECK  Final   Special Requests NONE  Final   Gram Stain   Final    RARE WBC PRESENT,BOTH PMN AND MONONUCLEAR RARE GRAM POSITIVE COCCI Performed at [facility], [place] [address] [place], [place] [address]    Culture RARE STAPHYLOCOCCUS EPIDERMIDIS  Final   Report Status 07/08/2019 FINAL  Final   Organism ID, Bacteria STAPHYLOCOCCUS EPIDERMIDIS  Final      Susceptibility   Staphylococcus epidermidis - MIC*    CIPROFLOXACIN <=0.5 SENSITIVE Sensitive     ERYTHROMYCIN <=0.25 SENSITIVE Sensitive     GENTAMICIN <=0.5 SENSITIVE Sensitive     OXACILLIN <=0.25 SENSITIVE Sensitive     TETRACYCLINE <=1 SENSITIVE  Sensitive     VANCOMYCIN 2 SENSITIVE Sensitive     TRIMETH/SULFA <=10 SENSITIVE Sensitive     CLINDAMYCIN <=0.25 SENSITIVE Sensitive     RIFAMPIN <=0.5 SENSITIVE  Sensitive     Inducible Clindamycin NEGATIVE Sensitive     * RARE STAPHYLOCOCCUS EPIDERMIDIS  MRSA PCR Screening     Status: None   Collection Time: 07/05/19 11:18 AM   Specimen: Nasal Mucosa; Nasopharyngeal  Result Value Ref Range Status   MRSA by PCR NEGATIVE NEGATIVE Final    Comment:        The GeneXpert MRSA Assay (FDA approved for NASAL specimens only), is one component of Sandria Mcenroe comprehensive MRSA colonization surveillance program. It is not intended to diagnose MRSA infection nor to guide or monitor treatment for MRSA infections. Performed at [facility], [place] [address] [place], [place] [address]          Radiology Studies: CT CHEST W CONTRAST  Result Date: 07/12/2019 CLINICAL DATA:  Abnormal right neck mass, concern for malignancy EXAM: CT CHEST WITH CONTRAST TECHNIQUE: Multidetector CT imaging of the chest was performed during intravenous contrast administration. CONTRAST:  38mL OMNIPAQUE IOHEXOL 300 MG/ML  SOLN COMPARISON:  07/03/2019, 09/28/2011 FINDINGS: Cardiovascular: Heart is not enlarged. No pericardial effusion. There is extensive atherosclerosis of the thoracic aorta and coronary vessels. There is Dann Galicia 4.6 cm ascending thoracic aortic aneurysm. No dissection. Mediastinum/Nodes: Numerous subcentimeter lymph nodes are seen within the mediastinum. Largest lymph node in the right paratracheal region image 30 measures 9 mm in short axis and in the precarinal region image 58 measures 10 mm in short axis. The esophagus and trachea are unremarkable. The infiltrating soft tissue mass involving the right lobe thyroid is again seen, measuring approximately 3.5 x 3.5 cm on image 8, previously measuring approximately 3.9 x 4.0 cm at Safal Halderman similar location. There is continued retropharyngeal soft tissue edema. Lungs/Pleura:  There are small bilateral pleural effusions, volume estimated less than 500 cc each. Minimal dependent atelectasis greatest in the left lower lobe. No airspace disease. No pneumothorax. Central airways are patent. Upper Abdomen: Shrunken nodular liver compatible cirrhosis. Extensive upper abdominal varices are seen. Borderline splenomegaly measuring 13 cm. Musculoskeletal: No acute displaced fractures. Chronic T12 compression deformity. Reconstructed images demonstrate no additional findings. IMPRESSION: 1. Slight decrease in the size of the infiltrating soft tissue right neck mass. Continued involvement of the right lobe thyroid with retropharyngeal and prevertebral soft tissue edema. Please refer to recent biopsy results. 2. Small bilateral pleural effusions with dependent lower lobe atelectasis. 3. Nonspecific borderline enlarged mediastinal lymph nodes. 4. Cirrhosis, with evidence of portal venous hypertension. 5.  Aortic Atherosclerosis (ICD10-I70.0). 6. 4.6 cm ascending thoracic aortic aneurysm. Recommend semi-annual imaging followup by CTA or MRA and referral to cardiothoracic surgery if not already obtained. This recommendation follows 2010 ACCF/AHA/AATS/ACR/ASA/SCA/SCAI/SIR/STS/SVM Guidelines for the Diagnosis and Management of Patients With Thoracic Aortic Disease. Circulation. 2010; 121: I696-E952. Aortic aneurysm NOS (ICD10-I71.9) Electronically Signed   By: Randa Ngo M.D.   On: 07/12/2019 20:34   VAS [place] UPPER EXTREMITY VENOUS DUPLEX  Result Date: 07/13/2019 UPPER VENOUS STUDY  Indications: Swelling Risk Factors: None identified. Limitations: Poor ultrasound/tissue interface. Comparison Study: No prior studies. Performing Technologist: Oliver Hum RVT  Examination Guidelines: Lindora Alviar complete evaluation includes B-mode imaging, spectral Doppler, color Doppler, and power Doppler as needed of all accessible portions of each vessel. Bilateral testing is considered an integral part of Janautica Netzley complete  examination. Limited examinations for reoccurring indications may be performed as noted.  Right Findings: +----------+------------+---------+-----------+----------+-------+ RIGHT     CompressiblePhasicitySpontaneousPropertiesSummary +----------+------------+---------+-----------+----------+-------+ IJV           Full       Yes  Yes                      +----------+------------+---------+-----------+----------+-------+ Subclavian    Full       Yes       Yes                      +----------+------------+---------+-----------+----------+-------+ Axillary      Full       Yes       Yes                      +----------+------------+---------+-----------+----------+-------+ Brachial      Full       Yes       Yes                      +----------+------------+---------+-----------+----------+-------+ Radial        Full                                          +----------+------------+---------+-----------+----------+-------+ Ulnar         Full                                          +----------+------------+---------+-----------+----------+-------+ Cephalic      Full                                          +----------+------------+---------+-----------+----------+-------+ Basilic       Full                                          +----------+------------+---------+-----------+----------+-------+  Left Findings: +----------+------------+---------+-----------+----------+-------+ LEFT      CompressiblePhasicitySpontaneousPropertiesSummary +----------+------------+---------+-----------+----------+-------+ Subclavian    Full       Yes       Yes                      +----------+------------+---------+-----------+----------+-------+  Summary:  Right: No evidence of deep vein thrombosis in the upper extremity. No evidence of superficial vein thrombosis in the upper extremity.  Left: No evidence of thrombosis in the subclavian.  *See table(s) above  for measurements and observations.    Preliminary         Scheduled Meds: . amitriptyline  25 mg Oral QHS  . atenolol  25 mg Oral q AM  . escitalopram  10 mg Oral q AM  . furosemide  20 mg Oral Daily  . hydrALAZINE  50 mg Oral Q8H  . lactulose  30 g Oral TID  . lisinopril  20 mg Oral BID  . magnesium oxide  400 mg Oral BID  . sodium chloride flush  10-40 mL Intracatheter Q12H   Continuous Infusions:    LOS: 12 days    Time spent: over 30 min    Fayrene Helper, MD [facility]   To contact the attending provider between 7A-7P or the covering provider during after hours 7P-7A, please log into the web site [email or web address] and access using universal [place] password for that web site. If you  do not have the password, please call the hospital operator.  07/13/2019, 2:33 PM

## 2019-07-13 NOTE — Progress Notes (Signed)
Plan of care reviewed. Pt's vital signs stable. Alert and oriented x 3, confused at time. Remained afebrile, EKG sinus rhythm on monitor. Denied pain. No immediate distress noted tonight. Continue to monitor.   Glen Finley HRCBUL,AG

## 2019-07-13 NOTE — H&P (Signed)
Physical Medicine and Rehabilitation Admission H&P    Chief Complaint  Patient presents with  . Fever  . 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 Krista Blue, obesity with BMI 29.43.  Patient recently discharged 06/30/2019 after being treated for acute hepatic encephalopathy in the setting of lactulose noncompliance.  Per chart review patient lives with spouse.  1 level home with ramped entrance.  Reportedly independent but sedentary prior to admission.  Presented 07/01/2019 with fever and altered mental status.  Admission chemistries BUN 23, creatinine 1.12, total bilirubin 3.2, blood cultures no growth to date, lactic acid 2.4, hemoglobin 14.8, platelets 94,000, urinalysis negative nitrite, ammonia 92, SARS coronavirus negative.  CT of abdomen pelvis showed liver cirrhosis with evidence for portal hypertension.  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  Left colon diverticular disease without acute inflammatory process.  He was placed on broad-spectrum antibiotics.  CT maxillofacial as well as MRI cervical spine soft tissue of the neck CT showed abnormal prevertebral retropharyngeal fluid or edema extending from C1-C2 level inferiorly to the C7 level.  Extensive inflammatory stranding and phlegmonous change extending into the right neck soft tissue centered at the C5 level.  Findings suspicious for associated 3.0 x 2.0 abscess along the right posterior aspect of the thyroid cartilage at the C5 level.  Patient had ultrasound-guided FNA of right neck mass by interventional radiology 07/04/2019 culture rare GPC.  Blood cultures negative so far.  Cytology from biopsy revealed  malignant cells.  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  Medical oncology has been consulted current plan supportive care, potential XRT.  All antibiotics have since been discontinued.  CT of the chest 07/12/2019 showed slight decrease in size of infiltrating soft tissue right neck mass.  Small bilateral pleural effusions with dependent lower lobe atelectasis.  4.6 cm ascending thoracic aortic aneurysm with recommendations of semiannual imaging followed by CTA or MRA.  In regards to patient's acute hepatic metabolic encephalopathy he continues on lactulose 30 mg 3 times daily his ammonia levels have improved to 44.  Patient is tolerating a regular diet.  Therapy evaluations completed and patient was admitted for a comprehensive rehab program.  Review of Systems  Constitutional: Negative for fever.  HENT: Negative for hearing loss.   Eyes: Negative for blurred vision and double vision.  Respiratory: Positive for shortness of breath. Negative for cough.   Cardiovascular: Positive for leg swelling. Negative for chest pain and palpitations.  Gastrointestinal: Positive for constipation and nausea. Negative for vomiting.       GERD  Genitourinary: Negative for dysuria, flank pain and hematuria.  Musculoskeletal: Positive for joint pain and myalgias.  Skin: Negative for rash.  Neurological: Positive for dizziness, weakness and headaches.       Gait disorder  Psychiatric/Behavioral: Positive for depression. The patient has insomnia.        Unspecified psychoses  All other systems reviewed and are negative.  Past Medical History:  Diagnosis Date  . Abnormality of gait   . GERD (gastroesophageal reflux disease)   . Headache(784.0)   . Hyperlipidemia   . Hypertension   . Migraine   .  Myasthenia gravis without  exacerbation ([place])   . Obesity   . Other extrapyramidal disease and abnormal movement disorder   . REM sleep behavior disorder   . Unspecified psychosis   . Weakness    Past Surgical History:  Procedure Laterality Date  . CATARACT EXTRACTION Bilateral    History reviewed. No pertinent family history. Social History:  reports that he has never smoked. He has never used smokeless tobacco. He reports that he does not drink alcohol or use drugs. Allergies:  Allergies  Allergen Reactions  . Amlodipine Swelling and Other (See Comments)    CAUSED SEVERE SWELLING OF THE LEGS THAT REQUIRED A TRIP TO THE MD'S OFFICE  . Cherry Other (See Comments)    Migraines   . Onion Other (See Comments)    Can eat cooked onions ONLY (or, patient develops migraines)  . Codeine Rash   Medications Prior to Admission  Medication Sig Dispense Refill  . alendronate (FOSAMAX) 70 MG tablet Take 70 mg by mouth every Sunday.   3  . amitriptyline (ELAVIL) 25 MG tablet Take 25 mg by mouth at bedtime.    Marland Kitchen aspirin EC 81 MG tablet Take 81 mg by mouth See admin instructions. Take 81 mg by mouth at bedtime on Mon/Wed/Fri (only)    . atenolol (TENORMIN) 25 MG tablet Take 25 mg by mouth in the morning.     . Cholecalciferol (VITAMIN D-3) 25 MCG (1000 UT) CAPS Take 1,000 Units by mouth daily with breakfast.    . escitalopram (LEXAPRO) 10 MG tablet Take 10 mg by mouth in the morning.   5  . fish oil-omega-3 fatty acids 1000 MG capsule Take 1 g by mouth 2 (two) times daily.    . furosemide (LASIX) 20 MG tablet Take 20 mg by mouth daily as needed for fluid or edema.     Marland Kitchen lactulose (CHRONULAC) 10 GM/15ML solution Take 15 mLs (10 g total) by mouth 2 (two) times daily. 236 mL 2  . Liniments (SALONPAS ARTHRITIS PAIN RELIEF EX) Apply 1 application topically daily as needed (for right knee pain).    Marland Kitchen lisinopril (ZESTRIL) 20 MG tablet Take 20 mg by mouth 2 (two) times daily.    . Multiple Vitamins-Minerals (ONE-A-DAY MENS 50+)  TABS Take 1 tablet by mouth daily with breakfast.    . Polyethyl Glyc-Propyl Glyc PF (SYSTANE ULTRA PF) 0.4-0.3 % SOLN Place 1 drop into both eyes 2 (two) times daily as needed (for eye redness or burning).      Drug Regimen Review Drug regimen was reviewed and remains appropriate with no significant issues identified  Home: Home Living Family/patient expects to be discharged to:: Private residence Living Arrangements: Spouse/significant other Available Help at Discharge: Family, Available 24 hours/day Type of Home: House Home Access: Stairs to enter, Ramped entrance Technical brewer of Steps: 3 Entrance Stairs-Rails: Right Home Layout: One level Bathroom Shower/Tub: Chiropodist: Standard Bathroom Accessibility: Yes Home Equipment: Environmental consultant - 2 wheels  Lives With: Spouse   Functional History: Prior Function Level of Independence: Independent Comments: decreased endurance at home  Functional Status:  Mobility: Bed Mobility Overal bed mobility: Needs Assistance Bed Mobility: Supine to Sit Supine to sit: HOB elevated, Supervision Sit to supine: Min assist General bed mobility comments: supervision for safety. Increased time and effort with use of bed rails and HOB elevated. Transfers Overall transfer level: Needs assistance Equipment used: Rolling walker (2 wheeled), None Transfers: Sit to/from Stand Sit to Stand: Min assist Stand  pivot transfers: Min assist General transfer comment: Pt flopped back on first attempt to rise requiring min A to power up and rise from EOB. Cues for safe hand placement with RW. Ambulation/Gait Ambulation/Gait assistance: Min guard, Min assist Gait Distance (Feet): 250 Feet(1 seated rest brake) Assistive device: Rolling walker (2 wheeled) Gait Pattern/deviations: Step-through pattern, Decreased stride length, Drifts right/left General Gait Details: Cues throughout for RW safety. After negotiating stairs, pt be same  noticably fatigued and unbalance, requiring increased assistance and a seated rest brake to recover. LOB x2 requiring min A to steady. SpO2 92%, HR 64 BPM Gait velocity: decr Gait velocity interpretation: <1.31 ft/sec, indicative of household ambulator Stairs: Yes Stairs assistance: Min assist, Mod assist Stair Management: Two rails, Forwards Number of Stairs: 3 General stair comments: Pt was able to ascend steps with min A for safety and balance. To descend the last step pt required mod A for balance and safety. He became more unsteady as he fatigued.    ADL: ADL Overall ADL's : Needs assistance/impaired Eating/Feeding: Set up, Sitting Grooming: Wash/dry hands, Standing, Minimal assistance Grooming Details (indicate cue type and reason): While standing at the sink. Min assist for balance and safety.  Upper Body Bathing: Set up, Standing Lower Body Bathing: Min guard, Cueing for safety, Cueing for sequencing, Sit to/from stand, Sitting/lateral leans Upper Body Dressing : Set up, Sitting Lower Body Dressing: Min guard, Sitting/lateral leans, Sit to/from stand, Cueing for safety Toilet Transfer: Minimal assistance, Ambulation, Regular Toilet, Grab bars, RW Toilet Transfer Details (indicate cue type and reason): Pt able to ambulate to/from bathroom with RW and min assist for balance and safety with RW. Pt required max cues on body positioning with RW to increase balance and safety.  Toileting- Clothing Manipulation and Hygiene: Minimal assistance, Sit to/from stand Toileting - Clothing Manipulation Details (indicate cue type and reason): Assist for clothing management and balance Functional mobility during ADLs: Minimal assistance, Rolling walker, Cueing for safety General ADL Comments: Pt able to ambulate to/from bathroom with RW and min assist for balance and safety with RW. Pt tolerated standing additional 2 min at the sink to wash hands requiring min assist for balance.    Cognition: Cognition Overall Cognitive Status: History of cognitive impairments - at baseline Orientation Level: Oriented to person, Oriented to place, Oriented to time, Disoriented to situation Cognition Arousal/Alertness: Awake/alert Behavior During Therapy: WFL for tasks assessed/performed Overall Cognitive Status: History of cognitive impairments - at baseline Area of Impairment: Attention, Memory, Safety/judgement, Awareness, Problem solving Current Attention Level: Sustained Memory: Decreased short-term memory Safety/Judgement: Decreased awareness of safety, Decreased awareness of deficits Awareness: Emergent Problem Solving: Requires verbal cues, Requires tactile cues General Comments: Frequent cues required for safety. Pt able to recall room number but assist required to find room.   Physical Exam: Blood pressure (!) 152/67, pulse 68, temperature 98 F (36.7 C), temperature source Axillary, resp. rate (!) 22, weight 92.2 kg, SpO2 97 %. Physical Exam  Constitutional: He is oriented to person, place, and time. No distress.  HENT:  Head: Normocephalic and atraumatic.  Nose: Nose normal.  Mouth/Throat: Oropharynx is clear and moist.  Multiple bruises about anterior/lateral neck/shoulder girdle area.   Eyes: Pupils are equal, round, and reactive to light. EOM are normal.  Neck: No tracheal deviation present. No thyromegaly present.  Cardiovascular: Normal rate and regular rhythm. Exam reveals no friction rub.  No murmur heard. Respiratory: Effort normal. No respiratory distress. He has no wheezes. He has no rales.  GI:  Soft. He exhibits no distension. There is no abdominal tenderness.  Musculoskeletal:     Cervical back: Normal range of motion.  Neurological: He is alert and oriented to person, place, and time. He has normal reflexes.  Sitting up in bed. Follows basic commands. Reasonable insight and awareness. Some delays in processing. no focal rigidity. Motor 4/5 UE  bilaterally. LE: 3+/5 HF, KE and 4/5 ADF/F. No focal sensory findings. DTR's trace to 1+  Skin: Skin is warm. He is not diaphoretic.  Psychiatric:  Flat but cooperative    Results for orders placed or performed during the hospital encounter of 07/01/19 (from the past 48 hour(s))  CBC with Differential/Platelet     Status: Abnormal   Collection Time: 07/13/19  4:16 AM  Result Value Ref Range   WBC 7.4 4.0 - 10.5 K/uL   RBC 3.83 (L) 4.22 - 5.81 MIL/uL   Hemoglobin 13.1 13.0 - 17.0 g/dL   HCT 38.0 (L) 39.0 - 52.0 %   MCV 99.2 80.0 - 100.0 fL   MCH 34.2 (H) 26.0 - 34.0 pg   MCHC 34.5 30.0 - 36.0 g/dL   RDW 15.9 (H) 11.5 - 15.5 %   Platelets 117 (L) 150 - 400 K/uL    Comment: Immature Platelet Fraction may be clinically indicated, consider ordering this additional test [id number] PLATELET COUNT CONFIRMED BY SMEAR REPEATED TO VERIFY    nRBC 0.0 0.0 - 0.2 %   Neutrophils Relative % 47 %   Neutro Abs 3.4 1.7 - 7.7 K/uL   Lymphocytes Relative 34 %   Lymphs Abs 2.5 0.7 - 4.0 K/uL   Monocytes Relative 13 %   Monocytes Absolute 0.9 0.1 - 1.0 K/uL   Eosinophils Relative 5 %   Eosinophils Absolute 0.4 0.0 - 0.5 K/uL   Basophils Relative 1 %   Basophils Absolute 0.1 0.0 - 0.1 K/uL   Immature Granulocytes 0 %   Abs Immature Granulocytes 0.03 0.00 - 0.07 K/uL    Comment: Performed at [facility], [address], [place], [place] [address]  Comprehensive metabolic panel     Status: Abnormal   Collection Time: 07/13/19  4:16 AM  Result Value Ref Range   Sodium 143 135 - 145 mmol/L   Potassium 3.6 3.5 - 5.1 mmol/L   Chloride 106 98 - 111 mmol/L   CO2 29 22 - 32 mmol/L   Glucose, Bld 94 70 - 99 mg/dL    Comment: Glucose reference range applies only to samples taken after fasting for at least 8 hours.   BUN 10 8 - 23 mg/dL   Creatinine, Ser 0.90 0.61 - 1.24 mg/dL   Calcium 8.5 (L) 8.9 - 10.3 mg/dL   Total Protein 5.9 (L) 6.5 - 8.1 g/dL   Albumin 2.3 (L) 3.5 - 5.0 g/dL   AST 72  (H) 15 - 41 U/L   ALT 30 0 - 44 U/L   Alkaline Phosphatase 73 38 - 126 U/L   Total Bilirubin 1.6 (H) 0.3 - 1.2 mg/dL   GFR calc non Af Amer >60 >60 mL/min   GFR calc Af Amer >60 >60 mL/min   Anion gap 8 5 - 15    Comment: Performed at [facility] [address] [place], [place] [address]  Magnesium     Status: Abnormal   Collection Time: 07/13/19  4:16 AM  Result Value Ref Range   Magnesium 1.5 (L) 1.7 - 2.4 mg/dL    Comment: Performed at  [facility], [place] [address] [place], [place] [address]  Phosphorus     Status: None   Collection Time: 07/13/19  4:16 AM  Result Value Ref Range   Phosphorus 2.7 2.5 - 4.6 mg/dL    Comment: Performed at [facility] [address] [place], [place] [address]  CBC with Differential/Platelet     Status: Abnormal   Collection Time: 07/14/19  2:39 AM  Result Value Ref Range   WBC 8.9 4.0 - 10.5 K/uL   RBC 3.94 (L) 4.22 - 5.81 MIL/uL   Hemoglobin 13.2 13.0 - 17.0 g/dL   HCT 38.8 (L) 39.0 - 52.0 %   MCV 98.5 80.0 - 100.0 fL   MCH 33.5 26.0 - 34.0 pg   MCHC 34.0 30.0 - 36.0 g/dL   RDW 15.6 (H) 11.5 - 15.5 %   Platelets 120 (L) 150 - 400 K/uL    Comment: Immature Platelet Fraction may be clinically indicated, consider ordering this additional test [id number]    nRBC 0.0 0.0 - 0.2 %   Neutrophils Relative % 50 %   Neutro Abs 4.5 1.7 - 7.7 K/uL   Lymphocytes Relative 33 %   Lymphs Abs 2.9 0.7 - 4.0 K/uL   Monocytes Relative 11 %   Monocytes Absolute 1.0 0.1 - 1.0 K/uL   Eosinophils Relative 5 %   Eosinophils Absolute 0.4 0.0 - 0.5 K/uL   Basophils Relative 1 %   Basophils Absolute 0.1 0.0 - 0.1 K/uL   Immature Granulocytes 0 %   Abs Immature Granulocytes 0.02 0.00 - 0.07 K/uL    Comment: Performed at [facility], [place] [address], [place], [place] [address]  Comprehensive metabolic panel     Status: Abnormal   Collection Time: 07/14/19  2:39 AM  Result Value Ref Range   Sodium 141 135 - 145 mmol/L   Potassium 3.6  3.5 - 5.1 mmol/L   Chloride 103 98 - 111 mmol/L   CO2 29 22 - 32 mmol/L   Glucose, Bld 108 (H) 70 - 99 mg/dL    Comment: Glucose reference range applies only to samples taken after fasting for at least 8 hours.   BUN 10 8 - 23 mg/dL   Creatinine, Ser 0.79 0.61 - 1.24 mg/dL   Calcium 8.6 (L) 8.9 - 10.3 mg/dL   Total Protein 5.8 (L) 6.5 - 8.1 g/dL   Albumin 2.3 (L) 3.5 - 5.0 g/dL   AST 73 (H) 15 - 41 U/L   ALT 30 0 - 44 U/L   Alkaline Phosphatase 77 38 - 126 U/L   Total Bilirubin 1.9 (H) 0.3 - 1.2 mg/dL   GFR calc non Af Amer >60 >60 mL/min   GFR calc Af Amer >60 >60 mL/min   Anion gap 9 5 - 15    Comment: Performed at [facility] [address], [place], [place] [address]  Magnesium     Status: Abnormal   Collection Time: 07/14/19  2:39 AM  Result Value Ref Range   Magnesium 1.6 (L) 1.7 - 2.4 mg/dL    Comment: Performed at [facility] [address], [place], [place] [address]  Phosphorus     Status: None   Collection Time: 07/14/19  2:39 AM  Result Value Ref Range   Phosphorus 2.5 2.5 - 4.6 mg/dL    Comment: Performed at [facility] [address], [place], [place] [address]   CT CHEST W CONTRAST  Result Date: 07/12/2019 CLINICAL DATA:  Abnormal right neck mass, concern for malignancy EXAM: CT CHEST WITH CONTRAST TECHNIQUE: Multidetector CT imaging of the chest was performed during intravenous contrast administration. CONTRAST:  12mL OMNIPAQUE IOHEXOL 300 MG/ML  SOLN COMPARISON:  07/03/2019, 09/28/2011 FINDINGS: Cardiovascular: Heart is not enlarged. No pericardial effusion. There is extensive atherosclerosis of the thoracic aorta and coronary vessels. There is a 4.6 cm ascending thoracic aortic aneurysm. No dissection. Mediastinum/Nodes: Numerous subcentimeter lymph nodes are seen within the mediastinum. Largest lymph node in the right paratracheal region image 30 measures 9 mm in short axis and in the precarinal region image 58 measures 10 mm in short axis.  The esophagus and trachea are unremarkable. The infiltrating soft tissue mass involving the right lobe thyroid is again seen, measuring approximately 3.5 x 3.5 cm on image 8, previously measuring approximately 3.9 x 4.0 cm at a similar location. There is continued retropharyngeal soft tissue edema. Lungs/Pleura: There are small bilateral pleural effusions, volume estimated less than 500 cc each. Minimal dependent atelectasis greatest in the left lower lobe. No airspace disease. No pneumothorax. Central airways are patent. Upper Abdomen: Shrunken nodular liver compatible cirrhosis. Extensive upper abdominal varices are seen. Borderline splenomegaly measuring 13 cm. Musculoskeletal: No acute displaced fractures. Chronic T12 compression deformity. Reconstructed images demonstrate no additional findings. IMPRESSION: 1. Slight decrease in the size of the infiltrating soft tissue right neck mass. Continued involvement of the right lobe thyroid with retropharyngeal and prevertebral soft tissue edema. Please refer to recent biopsy results. 2. Small bilateral pleural effusions with dependent lower lobe atelectasis. 3. Nonspecific borderline enlarged mediastinal lymph nodes. 4. Cirrhosis, with evidence of portal venous hypertension. 5.  Aortic Atherosclerosis (ICD10-I70.0). 6. 4.6 cm ascending thoracic aortic aneurysm. Recommend semi-annual imaging followup by CTA or MRA and referral to cardiothoracic surgery if not already obtained. This recommendation follows 2010 ACCF/AHA/AATS/ACR/ASA/SCA/SCAI/SIR/STS/SVM Guidelines for the Diagnosis and Management of Patients With Thoracic Aortic Disease. Circulation. 2010; 121: M226-J335. Aortic aneurysm NOS (ICD10-I71.9) Electronically Signed   By: Randa Ngo M.D.   On: 07/12/2019 20:34   VAS [place] UPPER EXTREMITY VENOUS DUPLEX  Result Date: 07/13/2019 UPPER VENOUS STUDY  Indications: Swelling Risk Factors: None identified. Limitations: Poor ultrasound/tissue interface. Comparison  Study: No prior studies. Performing Technologist: Oliver Hum RVT  Examination Guidelines: A complete evaluation includes B-mode imaging, spectral Doppler, color Doppler, and power Doppler as needed of all accessible portions of each vessel. Bilateral testing is considered an integral part of a complete examination. Limited examinations for reoccurring indications may be performed as noted.  Right Findings: +----------+------------+---------+-----------+----------+-------+ RIGHT     CompressiblePhasicitySpontaneousPropertiesSummary +----------+------------+---------+-----------+----------+-------+ IJV           Full       Yes       Yes                      +----------+------------+---------+-----------+----------+-------+ Subclavian    Full       Yes       Yes                      +----------+------------+---------+-----------+----------+-------+ Axillary      Full       Yes       Yes                      +----------+------------+---------+-----------+----------+-------+ Brachial      Full       Yes  Yes                      +----------+------------+---------+-----------+----------+-------+ Radial        Full                                          +----------+------------+---------+-----------+----------+-------+ Ulnar         Full                                          +----------+------------+---------+-----------+----------+-------+ Cephalic      Full                                          +----------+------------+---------+-----------+----------+-------+ Basilic       Full                                          +----------+------------+---------+-----------+----------+-------+  Left Findings: +----------+------------+---------+-----------+----------+-------+ LEFT      CompressiblePhasicitySpontaneousPropertiesSummary +----------+------------+---------+-----------+----------+-------+ Subclavian    Full       Yes       Yes                       +----------+------------+---------+-----------+----------+-------+  Summary:  Right: No evidence of deep vein thrombosis in the upper extremity. No evidence of superficial vein thrombosis in the upper extremity.  Left: No evidence of thrombosis in the subclavian.  *See table(s) above for measurements and observations.  Diagnosing physician: Monica Martinez MD Electronically signed by Monica Martinez MD on 07/13/2019 at 8:10:02 PM.    Final        Medical Problem List and Plan: 1.  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  Follow-up medical oncology  -admit to inpatient rehab  -patient may   shower  -ELOS/Goals: 5-7 days, supervision to mod I  -Oncology has been consulted. Likely palliative care. ?XRT 2.  Antithrombotics: -DVT/anticoagulation: SCDs.  Right upper extremity Doppler negative.  -antiplatelet therapy: N/A 3. Pain Management: Currently on no Tylenol or pain medication 4. Mood: Lexapro 10 mg daily.  Provide emotional support  -antipsychotic agents: N/A 5. Neuropsych: This patient is capable of making decisions on his own behalf. 6. Skin/Wound Care: Routine skin checks 7. Fluids/Electrolytes/Nutrition: Routine in and outs with follow-up chemistries 8.  Acute hepatic/metabolic encephalopathy.  Continue lactulose 30 mg 3 times daily.  Latest ammonia level improved at 44.  -cognitively appears improved today 9.  Chronic diastolic congestive heart failure.  Lasix 20 mg daily.  Monitor for signs of fluid overload. Daily weights 10.  Hypertension.  Hydralazine 50 mg every 8 hours, Tenormin 25 mg daily, lisinopril 20 mg twice daily.  Monitor with increased mobility 11.  Myasthenia gravis/progressive gait disorder.  Patient on no medications.  Follow-up outpatient neurology Dr. Krista Blue 12.  Thrombocytopenia/elevated AST and total bilirubin.  Likely due to cirrhosis/liver disease.  Follow-up  CBC. 13.  4.6 cm ascending thoracic aortic aneurysm.  Recommend semiannual imaging followed by CTA or MRA and referral to cardiothoracic surgery.     Lavon Paganini Angiulli, PA-C  07/14/2019 

## 2019-07-13 NOTE — Consult Note (Addendum)
[place]  [phone] [phone]   MEDICAL ONCOLOGY - INITIAL CONSULTATION  Referral MD:  Dr. Fayrene Helper  Reason for Referral: Poorly differentiated carcinoma   HPI: Glen Finley is an 85 year old male with a past medical history significant for myasthenia gravis/progressive gait disorder, Parkinson's versus Lewy body disease, bipolar, fatty liver disease with history of LFT elevation, cirrhosis, migraines, hypertension, hyperlipidemia, GERD, chronic diastolic CHF.  The patient came to the ER for fever and altered mental status.  He had just been discharged the day prior after being treated for acute hepatic encephalopathy in the setting of lactulose noncompliance.  In the ER, he was febrile and tachypneic.  Lactic acid was 2.4, platelet count 94,000.  His albumin was 2.6, AST 88, total bilirubin 2.6 (consistent with his prior lab work).  He had a CT of the maxillofacial performed on 07/02/2019 which showed a dominant abnormality prevertebral/retropharyngeal fluid or edema tracking which seems to increase towards the lower neck with reactive appearing right level 3 lymph nodes-retropharyngeal infection was thought to be most likely.  He had an MRI of the cervical spine on 07/02/2019 which showed abnormal retropharyngeal/prevertebral edema with associated phlegmonus change in the lower right neck concerning for acute infection and probable superimposed abscess.  A CT of the neck was performed on 07/03/2019 which again showed the fluid collection or edema concerning for abscess could not rule out neoplasm.  He had an FNA of the right neck which showed malignant cells, but there was no cellblock for additional studies.  He then had a needle core biopsy of the right neck which showed metastatic poorly differentiated carcinoma as well as a needle core biopsy of a right cervical lymph node which showed metastatic poorly differentiated carcinoma.  CT of the chest performed on  07/12/2019 shows slight decrease in the size of the infiltrating soft tissue right neck mass (measuring approximately 3.5 x 3.5 cm ), continued involvement of the right lobe thyroid with retropharyngeal and prevertebral soft tissue edema, small bilateral pleural effusions, nonspecific borderline enlarged mediastinal lymph nodes.   The patient's wife is at the bedside today and provides much of the history.  The patient has some intermittent periods of confusion.  The patient's wife reports that he has had anorexia as well as weight loss.  He has a fair amount of confusion at home and has gait disturbances and falls.  He is currently afebrile.  The patient had not noticed his right neck swelling prior to admission.  He is not having any dysphagia.  He is not currently complaining of any headaches or dizziness.  He is not having any chest pain or shortness of breath.  He denies abdominal pain, nausea, vomiting, constipation, diarrhea.  She reports no bleeding other than intermittent epistaxis.  He bruises very easily.  He has intermittent lower extremity edema.  No history of alcohol or tobacco use.  Medical oncology was asked see the patient to make recommendations regarding his metastatic poorly differentiated carcinoma.   Past Medical History:  Diagnosis Date  . Abnormality of gait   . GERD (gastroesophageal reflux disease)   . Headache(784.0)   . Hyperlipidemia   . Hypertension   . Migraine   . Myasthenia gravis without exacerbation ([place])   . Obesity   . Other extrapyramidal disease and abnormal movement disorder   . REM sleep behavior disorder   . Unspecified psychosis   . Weakness   :  Past Surgical History:  Procedure Laterality Date  .  CATARACT EXTRACTION Bilateral   :  Current Facility-Administered Medications  Medication Dose Route Frequency Provider Last Rate Last Admin  . amitriptyline (ELAVIL) tablet 25 mg  25 mg Oral QHS Dessa Phi, DO   25 mg at 07/12/19 2039  . atenolol  (TENORMIN) tablet 25 mg  25 mg Oral q AM Dessa Phi, DO   25 mg at 07/13/19 2841  . escitalopram (LEXAPRO) tablet 10 mg  10 mg Oral q AM Dessa Phi, DO   10 mg at 07/13/19 0511  . furosemide (LASIX) tablet 20 mg  20 mg Oral Daily Wendee Beavers T, MD   20 mg at 07/13/19 3244  . hydrALAZINE (APRESOLINE) injection 10 mg  10 mg Intravenous Q4H PRN Wendee Beavers T, MD   10 mg at 07/12/19 0412  . hydrALAZINE (APRESOLINE) tablet 50 mg  50 mg Oral Q8H Wendee Beavers T, MD   50 mg at 07/13/19 0508  . lactulose (CHRONULAC) 10 GM/15ML solution 30 g  30 g Oral TID Wendee Beavers T, MD   30 g at 07/13/19 0839  . lisinopril (ZESTRIL) tablet 20 mg  20 mg Oral BID Dessa Phi, DO   20 mg at 07/13/19 0102  . magnesium oxide (MAG-OX) tablet 400 mg  400 mg Oral BID Elodia Florence., MD   400 mg at 07/13/19 7253  . sodium chloride flush (NS) 0.9 % injection 10-40 mL  10-40 mL Intracatheter Q12H Wendee Beavers T, MD   10 mL at 07/13/19 0836  . sodium chloride flush (NS) 0.9 % injection 10-40 mL  10-40 mL Intracatheter PRN Mercy Riding, MD         Allergies  Allergen Reactions  . Amlodipine Swelling and Other (See Comments)    CAUSED SEVERE SWELLING OF THE LEGS THAT REQUIRED A TRIP TO THE MD'S OFFICE  . Cherry Other (See Comments)    Migraines   . Onion Other (See Comments)    Can eat cooked onions ONLY (or, patient develops migraines)  . Codeine Rash  :  History reviewed. No pertinent family history.:  Social History   Socioeconomic History  . Marital status: Married    Spouse name: [name]  . Number of children: 2  . Years of education: 12  . Highest education level: Not on file  Occupational History  . Occupation: Network engineer.    Comment: Retired  Tobacco Use  . Smoking status: Never Smoker  . Smokeless tobacco: Never Used  Substance and Sexual Activity  . Alcohol use: No    Alcohol/week: 0.0 standard drinks  . Drug use: No  . Sexual activity: Never  Other Topics Concern  . Not on  file  Social History Narrative   Patient lives at home with his wife [name]). Patient is retired. High school education.   Left handed.   Caffeine- One cup daily.   Social Determinants of Health   Financial Resource Strain:   . Difficulty of Paying Living Expenses:   Food Insecurity:   . Worried About Charity fundraiser in the Last Year:   . Arboriculturist in the Last Year:   Transportation Needs:   . Film/video editor (Medical):   Marland Kitchen Lack of Transportation (Non-Medical):   Physical Activity:   . Days of Exercise per Week:   . Minutes of Exercise per Session:   Stress:   . Feeling of Stress :   Social Connections:   . Frequency of Communication with Friends and Family:   .  Frequency of Social Gatherings with Friends and Family:   . Attends Religious Services:   . Active Member of Clubs or Organizations:   . Attends Archivist Meetings:   Marland Kitchen Marital Status:   Intimate Partner Violence:   . Fear of Current or Ex-Partner:   . Emotionally Abused:   Marland Kitchen Physically Abused:   . Sexually Abused:   :  Review of Systems: A comprehensive 14 point review of systems was negative except as noted in the HPI.  Exam: Patient Vitals for the past 24 hrs:  BP Temp Temp src Pulse Resp SpO2 Weight  07/13/19 1159 128/63 98.7 F (37.1 C) Oral 60 20 94 % --  07/13/19 0826 (!) 141/63 98.1 F (36.7 C) Oral 64 (!) 28 93 % --  07/13/19 0341 (!) 161/66 -- -- 70 (!) 24 94 % --  07/13/19 0340 (!) 165/71 -- -- 73 (!) 24 94 % --  07/13/19 0338 (!) 165/71 98.1 F (36.7 C) Oral 74 (!) 24 95 % --  07/13/19 0337 -- -- -- -- -- -- 90.4 kg  07/12/19 2027 (!) 149/61 98.1 F (36.7 C) Oral 73 19 93 % --  07/12/19 1618 (!) 144/53 -- -- -- -- -- --    General: Chronically ill-appearing elderly male, no distress Eyes:  no scleral icterus.   ENT:  There were no oropharyngeal lesions.     Lymphatics: Right neck edema.   Respiratory: lungs were clear bilaterally without wheezing or crackles.    Cardiovascular:  Regular rate and rhythm, S1/S2, without murmur, rub or gallop.  There was no pedal edema.   GI:  abdomen was soft, flat, nontender, nondistended, without organomegaly.   Musculoskeletal:  no spinal tenderness of palpation of vertebral spine.   Skin : No petechiae, multiple ecchymoses to his arms and right neck.   Neuro exam was nonfocal. Patient was alert and oriented.      Lab Results  Component Value Date   WBC 7.4 07/13/2019   HGB 13.1 07/13/2019   HCT 38.0 (L) 07/13/2019   PLT 117 (L) 07/13/2019   GLUCOSE 94 07/13/2019   ALT 30 07/13/2019   AST 72 (H) 07/13/2019   NA 143 07/13/2019   K 3.6 07/13/2019   CL 106 07/13/2019   CREATININE 0.90 07/13/2019   BUN 10 07/13/2019   CO2 29 07/13/2019    DG Chest 2 View  Result Date: 07/01/2019 CLINICAL DATA:  Altered mental status with fever EXAM: CHEST - 2 VIEW COMPARISON:  06/28/2019, CT chest 09/28/2011 FINDINGS: Mild cardiomegaly. No focal consolidation or pleural effusion. Aortic atherosclerosis. No pneumothorax IMPRESSION: 1. No acute focal airspace disease 2. Mild cardiomegaly Electronically Signed   By: Donavan Foil M.D.   On: 07/01/2019 21:29   CT Head Wo Contrast  Result Date: 06/28/2019 CLINICAL DATA:  Confusion. Increased confusion over the past 2 days. EXAM: CT HEAD WITHOUT CONTRAST TECHNIQUE: Contiguous axial images were obtained from the base of the skull through the vertex without intravenous contrast. COMPARISON:  Head CT and brain MRI May 2013 FINDINGS: Brain: No intracranial hemorrhage, mass effect, or midline shift. Age related atrophy. No hydrocephalus. The basilar cisterns are patent. Moderate to advanced chronic small vessel ischemia. No evidence of territorial infarct or acute ischemia. No extra-axial or intracranial fluid collection. Vascular: Atherosclerosis of skullbase vasculature without hyperdense vessel or abnormal calcification. Skull: No fracture or focal lesion. Sinuses/Orbits: Complete  opacification of left side of sphenoid sinus with high-density material. Opacification of posterior left  ethmoid air cells. There is some surrounding bony sclerosis. Mild mucosal thickening of right side of sphenoid sinus. Mastoid air cells are clear. Bilateral cataract resection. Other: None. IMPRESSION: 1. No acute intracranial abnormality. 2. Age related atrophy. Moderate to advanced chronic small vessel ischemia. 3. Opacification of left sphenoid sinus and posterior ethmoid air cells with high density material. There is adjacent bony sclerosis suggesting this is chronic sinusitis, however was not seen on 2013 exam. Electronically Signed   By: Keith Rake M.D.   On: 06/28/2019 17:47   CT SOFT TISSUE NECK W CONTRAST  Result Date: 07/03/2019 CLINICAL DATA:  Provided INDICATION: Retropharyngeal/prevertebral edema, abscess, sepsis. EXAM: CT NECK WITH CONTRAST TECHNIQUE: Multidetector CT imaging of the neck was performed using the standard protocol following the bolus administration of intravenous contrast. CONTRAST:  63m OMNIPAQUE IOHEXOL 300 MG/ML  SOLN COMPARISON:  Cervical spine MRI 07/02/2019, maxillofacial CT 07/02/2019. FINDINGS: Pharynx and larynx: Streak artifact from dental restoration limits evaluation of the oral cavity. There is no appreciable discrete mass or swelling within the oral cavity or nasopharynx. Again demonstrated is abnormal prevertebral/retropharyngeal fluid or edema extending from the C1-C2 level inferiorly to the C7 level. Centered at the C5-C6 level there is prominent inflammatory stranding and phlegmon extending into the right neck soft tissues measuring 4.0 x 4.6 x 7.5 cm (for instance as seen on series 10, image 88 and series 8, image 44). Within this region at the C5 level, there is a somewhat circumscribed region of hypoattenuation along the right posterior aspect of the thyroid cartilage measuring 3.0 x 2.0 cm in transaxial dimensions (series 10, image 74). Although no  well-defined peripheral enhancement is demonstrated, findings are suspicious for early abscess formation. There is associated leftward deviation of the laryngeal airway and subglottic trachea without significant airway narrowing. There is rightward displacement of the right internal jugular vein and common carotid artery. There is heterogeneous enhancement of the right thyroid lobe suggestive of thyroiditis. Salivary glands: Unremarkable. Thyroid: Heterogeneous appearance of the right thyroid lobe with extensive surrounding phlegmonous change/abscess as described below. There are few coarse calcifications within the left thyroid lobe. Lymph nodes: Right cervical chain lymphadenopathy is likely reactive. Most notably a right level III lymph node measures 13 mm in short axis (series 8, image 30). Vascular: The major vascular structures of the neck appear patent. The right internal jugular vein is significantly narrowed at the level of the mid neck due to phlegmonous change/abscess. Limited intracranial: No abnormality identified. Visualized orbits: Excluded from the field of view. Mastoids and visualized paranasal sinuses: The paranasal sinuses are incompletely imaged. Redemonstrated complete opacification of the left sphenoid sinus with associated reactive osteitis. No significant mastoid effusion. Skeleton: No acute bony abnormality. Redemonstrated cervical spondylosis with multilevel disc space narrowing, posterior disc osteophytes as well as uncovertebral hypertrophy. Upper chest: No consolidation within the imaged lung apices. IMPRESSION: Redemonstrated abnormal prevertebral/retropharyngeal fluid or edema extending from the C1-C2 level inferiorly to the C7 level. Extensive inflammatory stranding and phlegmonous change extending into the right neck soft tissues centered at the C5 level. Findings suspicious for an associated 3.0 x 2.0 abscess along the right posterior aspect of the thyroid cartilage at the C5  level. Heterogeneous enhancement of the right thyroid lobe suspicious for secondary thyroiditis. Findings likely reflect sequela of an infectious process. Close clinical follow-up is recommended with repeat imaging as warranted to exclude alternative etiologies (i.e. Neoplasm). Right cervical lymphadenopathy, likely reactive. Chronic left sphenoid sinusitis. Electronically Signed   By: KKellie Simmering  DO   On: 07/03/2019 14:35   CT CHEST W CONTRAST  Result Date: 07/12/2019 CLINICAL DATA:  Abnormal right neck mass, concern for malignancy EXAM: CT CHEST WITH CONTRAST TECHNIQUE: Multidetector CT imaging of the chest was performed during intravenous contrast administration. CONTRAST:  33m OMNIPAQUE IOHEXOL 300 MG/ML  SOLN COMPARISON:  07/03/2019, 09/28/2011 FINDINGS: Cardiovascular: Heart is not enlarged. No pericardial effusion. There is extensive atherosclerosis of the thoracic aorta and coronary vessels. There is a 4.6 cm ascending thoracic aortic aneurysm. No dissection. Mediastinum/Nodes: Numerous subcentimeter lymph nodes are seen within the mediastinum. Largest lymph node in the right paratracheal region image 30 measures 9 mm in short axis and in the precarinal region image 58 measures 10 mm in short axis. The esophagus and trachea are unremarkable. The infiltrating soft tissue mass involving the right lobe thyroid is again seen, measuring approximately 3.5 x 3.5 cm on image 8, previously measuring approximately 3.9 x 4.0 cm at a similar location. There is continued retropharyngeal soft tissue edema. Lungs/Pleura: There are small bilateral pleural effusions, volume estimated less than 500 cc each. Minimal dependent atelectasis greatest in the left lower lobe. No airspace disease. No pneumothorax. Central airways are patent. Upper Abdomen: Shrunken nodular liver compatible cirrhosis. Extensive upper abdominal varices are seen. Borderline splenomegaly measuring 13 cm. Musculoskeletal: No acute displaced  fractures. Chronic T12 compression deformity. Reconstructed images demonstrate no additional findings. IMPRESSION: 1. Slight decrease in the size of the infiltrating soft tissue right neck mass. Continued involvement of the right lobe thyroid with retropharyngeal and prevertebral soft tissue edema. Please refer to recent biopsy results. 2. Small bilateral pleural effusions with dependent lower lobe atelectasis. 3. Nonspecific borderline enlarged mediastinal lymph nodes. 4. Cirrhosis, with evidence of portal venous hypertension. 5.  Aortic Atherosclerosis (ICD10-I70.0). 6. 4.6 cm ascending thoracic aortic aneurysm. Recommend semi-annual imaging followup by CTA or MRA and referral to cardiothoracic surgery if not already obtained. This recommendation follows 2010 ACCF/AHA/AATS/ACR/ASA/SCA/SCAI/SIR/STS/SVM Guidelines for the Diagnosis and Management of Patients With Thoracic Aortic Disease. Circulation. 2010; 121:: [id number] Aortic aneurysm NOS (ICD10-I71.9) Electronically Signed   By: MRanda NgoM.D.   On: 07/12/2019 20:34   MR CERVICAL SPINE W WO CONTRAST  Result Date: 07/03/2019 CLINICAL DATA:  Initial evaluation for acute sepsis, unknown source, concern for discitis. EXAM: MRI CERVICAL SPINE WITHOUT AND WITH CONTRAST TECHNIQUE: Multiplanar and multiecho pulse sequences of the cervical spine, to include the craniocervical junction and cervicothoracic junction, were obtained without and with intravenous contrast. CONTRAST:  937mGADAVIST GADOBUTROL 1 MMOL/ML IV SOLN COMPARISON:  Prior maxillofacial CT from earlier the same day. FINDINGS: Alignment: Mild straightening of the normal cervical lordosis. No listhesis. Vertebrae: Vertebral body height maintained without evidence for acute or chronic fracture. Bone marrow signal intensity somewhat diffusely decreased on T1 weighted imaging, nonspecific, but most commonly related to anemia, smoking, or obesity. No discrete or worrisome osseous lesions. Mild reactive  endplate changes present about the C4-5 through C6-7 interspaces, degenerative in nature. No findings to suggest osteomyelitis discitis or septic arthritis. No abnormal marrow edema or enhancement. Cord: Signal intensity within the cervical spinal cord is normal. No abnormal enhancement. No epidural abscess or other collection. Posterior Fossa, vertebral arteries, paraspinal tissues: Age-related cerebral atrophy noted within the visualized brain. Craniocervical junction normal. There is abnormal prevertebral/retropharyngeal edema extending from the nasopharynx through the upper thoracic spine. Abnormal edema with phlegmonous change seen involving the right parapharyngeal space, most pronounced at the level of the thyroid cartilage at approximately level III/IV (series 9,  image 29). Finding concerning for acute infection given provided history. There is suggestion of a superimposed abscess at this level measuring up to approximately 2 cm (series 12, image 24), incompletely visualized. No frank loculated retropharyngeal abscess or collection. Prominent right level II/III lymph node measures up to 12 mm, likely reactive (series 8, image 20). Normal flow void seen within the vertebral arteries bilaterally. Disc levels: C2-C3: Small central disc protrusion minimally indents the ventral thecal sac. No canal or foraminal stenosis. C3-C4: Moderate-sized central disc protrusion indents the ventral thecal sac, contacting and mildly flattening the ventral spinal cord. Resultant moderate spinal stenosis. Superimposed bilateral uncovertebral and facet hypertrophy. Resultant mild left greater than right C4 foraminal stenosis. C4-C5: Chronic intervertebral disc space narrowing with mild diffuse disc bulge. Right greater than left uncovertebral hypertrophy. Broad posterior disc osteophyte flattens and partially faces the ventral thecal sac, greater on the right. Resultant mild-to-moderate spinal stenosis with mild flattening of the  right hemi cord. Severe right with moderate left C5 foraminal stenosis. C5-C6: Diffuse disc bulge with bilateral uncovertebral hypertrophy. Broad posterior component effaces the ventral thecal sac and resultant moderate spinal stenosis. Severe right with moderate left C6 foraminal narrowing. C6-C7: Diffuse disc bulge with bilateral uncovertebral hypertrophy. Mild ligament flavum thickening. Resultant mild spinal stenosis without cord impingement. Moderate left worse than right C7 foraminal narrowing. C7-T1:  Negative interspace.  Mild facet hypertrophy.  No stenosis. Visualized upper thoracic spine demonstrates no significant finding. IMPRESSION: 1. Abnormal retropharyngeal/prevertebral edema with associated phlegmonous change within the lower right neck as above, concerning for acute infection. Probable superimposed abscess measuring up to approximately 2 cm, incompletely visualized on this exam. Further evaluation with dedicated soft tissue neck CT with contrast recommended for further evaluation. 2. No evidence for osteomyelitis discitis within the cervical spine. 3. Multilevel cervical spondylosis with resultant mild to moderate diffuse spinal stenosis at C3-4 through C6-7. Associated moderate to severe bilateral C5 through C7 foraminal narrowing as above. Electronically Signed   By: Jeannine Boga M.D.   On: 07/03/2019 03:29   CT Abdomen Pelvis W Contrast  Result Date: 07/01/2019 CLINICAL DATA:  Cirrhosis EXAM: CT ABDOMEN AND PELVIS WITH CONTRAST TECHNIQUE: Multidetector CT imaging of the abdomen and pelvis was performed using the standard protocol following bolus administration of intravenous contrast. CONTRAST:  143m OMNIPAQUE IOHEXOL 300 MG/ML  SOLN COMPARISON:  Ultrasound 06/28/2019, CT 06/27/2014 FINDINGS: Lower chest: Lung bases demonstrate streaky atelectasis or scarring within the bases. No acute consolidation or pleural effusion. Borderline to mild cardiomegaly. Hepatobiliary: Cirrhotic  morphology of the liver. Subcentimeter hypodensities too small to further characterize. No calcified gallstone or biliary dilatation Pancreas: Unremarkable. No pancreatic ductal dilatation or surrounding inflammatory changes. Spleen: Normal in size without focal abnormality. Adrenals/Urinary Tract: Adrenal glands are normal. Cortical scarring in the mid left kidney. Negative for hydronephrosis. The bladder is normal Stomach/Bowel: Stomach is within normal limits. Appendix appears normal. No evidence of bowel wall thickening, distention, or inflammatory changes. Diverticular disease of the left colon without acute inflammatory change. Vascular/Lymphatic: Moderate aortic atherosclerosis without aneurysm. Subcentimeter retroperitoneal nodes. Cavernous transformation of the portal vein. Numerous varices and collateral vessels within the abdomen. Reproductive: Slightly enlarged prostate Other: Negative for free air or free fluid. Musculoskeletal: Chronic compression deformity of T12. IMPRESSION: 1. Liver cirrhosis with evidence for portal hypertension. 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 2. Left colon diverticular disease without acute inflammatory process. Electronically Signed   By: KMadie RenoD.  On: 07/01/2019 23:26   DG Chest Port 1 View  Result Date: 06/28/2019 CLINICAL DATA:  Increased confusion, cirrhosis EXAM: PORTABLE CHEST 1 VIEW COMPARISON:  None. FINDINGS: No consolidation or edema. No pleural effusion or pneumothorax. Cardiomediastinal contours are within normal limits for portable technique. There is calcified plaque along the aortic arch. IMPRESSION: No acute process in the chest. Electronically Signed   By: Macy Mis M.D.   On: 06/28/2019 15:49   [place] CORE BIOPSY (LYMPH NODES)  Result Date: 07/10/2019 INDICATION: Indeterminate right-sided neck mass, potentially associated with the right lobe of the thyroid with  associated right cervical lymphadenopathy. Patient was admitted with concern for infection, however FNA biopsy of the dominant right-sided neck mass demonstrated atypia. As such, patient returns today for ultrasound-guided core needle biopsy of right-sided neck mass as well as ultrasound-guided core needle biopsy of right cervical lymph node for tissue diagnostic purposes. EXAM: 1. ULTRASOUND-GUIDED RIGHT NECK MASS CORE NEEDLE BIOPSY 2. ULTRASOUND-GUIDED RIGHT CERVICAL LYMPH NODE CORE NEEDLE BIOPSY COMPARISON:  Neck CT-07/03/2019; ultrasound-guided right neck mass fine-needle aspiration-07/04/2019 MEDICATIONS: None ANESTHESIA/SEDATION: Moderate (conscious) sedation was employed during this procedure. A total of Versed 0.5 mg and Fentanyl 25 mcg was administered intravenously. Moderate Sedation Time: 14 minutes. The patient's level of consciousness and vital signs were monitored continuously by radiology nursing throughout the procedure under my direct supervision. COMPLICATIONS: None immediate. TECHNIQUE: Informed written consent was obtained from the patient after a discussion of the risks, benefits and alternatives to treatment. Questions regarding the procedure were encouraged and answered. Initial ultrasound scanning demonstrated grossly unchanged size and appearance of the at least 3.2 x 3.0 cm right sided neck mass, potentially associated with the right lobe of the thyroid (image 4). Additionally, note is made of an approximately 1.3 x 1.0 cm right cervical lymph node likely correlating with the dominant cervical lymph node seen on preceding neck CT image 58, series 3. Multiple ultrasound images were saved procedural documentation purposes. The procedure was planned. A timeout was performed prior to the initiation of the procedure. The operative sites were was prepped and draped in the usual sterile fashion, and a sterile drape was applied covering the operative field. A timeout was performed prior to the  initiation of the procedure. Local anesthesia was provided with 1% lidocaine with epinephrine. Initially beginning with the dominant right cervical lymph node, an 18 gauge core needle biopsy device was utilized to obtain a 4 core needle biopsies under direct ultrasound guidance. Next, a separate 18 gauge core needle biopsy device was utilized to obtain 5 core needle biopsies of the dominant right neck mass. The samples were placed in saline and submitted separate to pathology. Superficial hemostasis was achieved with manual compression. Post procedure scan was negative for significant hematoma. Dressings were applied. The patient tolerated the procedure well without immediate postprocedural complication. IMPRESSION: 1. Technically successful ultrasound guided biopsy of dominant right cervical lymph node. 2. Technically successful ultrasound-guided biopsy of right-sided neck mass, potentially associated with the right lobe of the thyroid. Electronically Signed   By: Sandi Mariscal M.D.   On: 07/10/2019 16:38   [place] FNA SOFT TISSUE  Result Date: 07/04/2019 INDICATION: 85 year old with a right neck mass. Request for ultrasound-guided aspiration. EXAM: ULTRASOUND-GUIDED FINE NEEDLE ASPIRATION OF RIGHT NECK MASS MEDICATIONS: None. ANESTHESIA/SEDATION: None FLUOROSCOPY TIME:  None COMPLICATIONS: None immediate. PROCEDURE: Informed written consent was obtained from the patient after a thorough discussion of the procedural risks, benefits and alternatives. All questions were addressed. A timeout was performed  prior to the initiation of the procedure. Right side of the neck was evaluated with ultrasound. Right neck lesion was identified. The lesion appeared to be mostly solid, therefore, many of the specimens were collected for cytology. Four fine-needle aspirations were performed with 25 gauge needles. Two fine-needle aspirations were obtained with 18 gauge needles. Bandage placed over the puncture site. FINDINGS:  Heterogeneous hypoechoic lesion that appears to be inseparable from the thyroid tissue. This soft tissue causes lateral displacement of the right common carotid artery and right internal jugular vein. No discrete fluid collections were identified. No significant fluid could be aspirated from this right neck lesion/mass. One of the 18 gauge fine-needle aspirations was collected for culture. IMPRESSION: Heterogeneous solid right neck mass. This lesion appears to be indistinguishable from the right thyroid tissue. No significant fluid could be aspirated from this right neck lesion. Ultrasound-guided fine-needle aspirations were obtained from the lesion. Electronically Signed   By: Markus Daft M.D.   On: 07/04/2019 17:42   [place] CORE BIOPSY (SOFT TISSUE)  Result Date: 07/10/2019 INDICATION: Indeterminate right-sided neck mass, potentially associated with the right lobe of the thyroid with associated right cervical lymphadenopathy. Patient was admitted with concern for infection, however FNA biopsy of the dominant right-sided neck mass demonstrated atypia. As such, patient returns today for ultrasound-guided core needle biopsy of right-sided neck mass as well as ultrasound-guided core needle biopsy of right cervical lymph node for tissue diagnostic purposes. EXAM: 1. ULTRASOUND-GUIDED RIGHT NECK MASS CORE NEEDLE BIOPSY 2. ULTRASOUND-GUIDED RIGHT CERVICAL LYMPH NODE CORE NEEDLE BIOPSY COMPARISON:  Neck CT-07/03/2019; ultrasound-guided right neck mass fine-needle aspiration-07/04/2019 MEDICATIONS: None ANESTHESIA/SEDATION: Moderate (conscious) sedation was employed during this procedure. A total of Versed 0.5 mg and Fentanyl 25 mcg was administered intravenously. Moderate Sedation Time: 14 minutes. The patient's level of consciousness and vital signs were monitored continuously by radiology nursing throughout the procedure under my direct supervision. COMPLICATIONS: None immediate. TECHNIQUE: Informed written consent was  obtained from the patient after a discussion of the risks, benefits and alternatives to treatment. Questions regarding the procedure were encouraged and answered. Initial ultrasound scanning demonstrated grossly unchanged size and appearance of the at least 3.2 x 3.0 cm right sided neck mass, potentially associated with the right lobe of the thyroid (image 4). Additionally, note is made of an approximately 1.3 x 1.0 cm right cervical lymph node likely correlating with the dominant cervical lymph node seen on preceding neck CT image 58, series 3. Multiple ultrasound images were saved procedural documentation purposes. The procedure was planned. A timeout was performed prior to the initiation of the procedure. The operative sites were was prepped and draped in the usual sterile fashion, and a sterile drape was applied covering the operative field. A timeout was performed prior to the initiation of the procedure. Local anesthesia was provided with 1% lidocaine with epinephrine. Initially beginning with the dominant right cervical lymph node, an 18 gauge core needle biopsy device was utilized to obtain a 4 core needle biopsies under direct ultrasound guidance. Next, a separate 18 gauge core needle biopsy device was utilized to obtain 5 core needle biopsies of the dominant right neck mass. The samples were placed in saline and submitted separate to pathology. Superficial hemostasis was achieved with manual compression. Post procedure scan was negative for significant hematoma. Dressings were applied. The patient tolerated the procedure well without immediate postprocedural complication. IMPRESSION: 1. Technically successful ultrasound guided biopsy of dominant right cervical lymph node. 2. Technically successful ultrasound-guided biopsy of right-sided neck  mass, potentially associated with the right lobe of the thyroid. Electronically Signed   By: Sandi Mariscal M.D.   On: 07/10/2019 16:38   VAS [place] UPPER EXTREMITY VENOUS  DUPLEX  Result Date: 07/13/2019 UPPER VENOUS STUDY  Indications: Swelling Risk Factors: None identified. Limitations: Poor ultrasound/tissue interface. Comparison Study: No prior studies. Performing Technologist: Oliver Hum RVT  Examination Guidelines: A complete evaluation includes B-mode imaging, spectral Doppler, color Doppler, and power Doppler as needed of all accessible portions of each vessel. Bilateral testing is considered an integral part of a complete examination. Limited examinations for reoccurring indications may be performed as noted.  Right Findings: +----------+------------+---------+-----------+----------+-------+ RIGHT     CompressiblePhasicitySpontaneousPropertiesSummary +----------+------------+---------+-----------+----------+-------+ IJV           Full       Yes       Yes                      +----------+------------+---------+-----------+----------+-------+ Subclavian    Full       Yes       Yes                      +----------+------------+---------+-----------+----------+-------+ Axillary      Full       Yes       Yes                      +----------+------------+---------+-----------+----------+-------+ Brachial      Full       Yes       Yes                      +----------+------------+---------+-----------+----------+-------+ Radial        Full                                          +----------+------------+---------+-----------+----------+-------+ Ulnar         Full                                          +----------+------------+---------+-----------+----------+-------+ Cephalic      Full                                          +----------+------------+---------+-----------+----------+-------+ Basilic       Full                                          +----------+------------+---------+-----------+----------+-------+  Left Findings: +----------+------------+---------+-----------+----------+-------+ LEFT       CompressiblePhasicitySpontaneousPropertiesSummary +----------+------------+---------+-----------+----------+-------+ Subclavian    Full       Yes       Yes                      +----------+------------+---------+-----------+----------+-------+  Summary:  Right: No evidence of deep vein thrombosis in the upper extremity. No evidence of superficial vein thrombosis in the upper extremity.  Left: No evidence of thrombosis in the subclavian.  *See table(s) above for measurements and observations.    Preliminary    CT MAXILLOFACIAL WO  CONTRAST  Result Date: 07/02/2019 CLINICAL DATA:  85 year old male. Sepsis. Sinus congestion. EXAM: CT MAXILLOFACIAL WITHOUT CONTRAST TECHNIQUE: Multidetector CT imaging of the maxillofacial structures was performed. Multiplanar CT image reconstructions were also generated. COMPARISON:  Head CT 06/28/2019, brain MRI 08/11/2011. FINDINGS: Osseous: The visible cervical vertebrae appear intact (through C5) but there is abnormal prevertebral soft tissue swelling and retropharyngeal or prevertebral fluid/edema (series 3, image 16). This seems to progress caudally in the neck, and becomes eccentric to the right at the level of the thyroid cartilage on series 3, image 87. Mandible intact. No acute dental abnormality identified. Maxilla, zygoma, nasal bones, central skull base and visible calvarium appear intact. Orbits: Intact orbital walls. Postoperative changes to both globes, otherwise negative noncontrast orbits soft tissues. Sinuses: Posterior left ethmoid and left sphenoid sinus opacification with periosteal thickening. But no complicating features are identified, and the remaining paranasal sinuses are well pneumatized. Tympanic cavities and mastoids are clear. Soft tissues: Abnormal prevertebral/retropharyngeal fluid or edema tracking inferiorly in the neck as stated above. There is also a partially retropharyngeal course of the right ICA. There are reactive appearing right  level 3 lymph nodes on series 3, image 85. But elsewhere the noncontrast deep soft tissue spaces of the face and neck are within normal limits. Limited intracranial: Stable, No acute intracranial abnormality. Calcified atherosclerosis at the skull base. IMPRESSION: 1. The dominant abnormality is prevertebral/retropharyngeal fluid or edema tracking which seems to increase towards the lower neck. Reactive appearing right level 3 lymph nodes. Visible cervical vertebrae appear intact, but in the setting of sepsis lower Cervical Discitis-Osteomyelitis is a leading consideration. Retropharyngeal infection is in the differential. Cervical Spine MRI without and with contrast would be most valuable. Failing that Neck CT with IV contrast would be recommended. 2. Posterior left ethmoid and left sphenoid sinusitis, but has a chronic appearance with no complicating features. Electronically Signed   By: Genevie Ann M.D.   On: 07/02/2019 14:21   US Abdomen Limited RUQ  Result Date: 06/28/2019 CLINICAL DATA:  Altered mental status.  LFT elevation. EXAM: ULTRASOUND ABDOMEN LIMITED RIGHT UPPER QUADRANT COMPARISON:  None. FINDINGS: Gallbladder: No gallstones or wall thickening visualized. No sonographic Murphy sign noted by sonographer. Common bile duct: Diameter: The common bile duct was poorly evaluated. Liver: Diffuse increased echogenicity with slightly heterogeneous liver. Appearance typically secondary to fatty infiltration. Fibrosis secondary consideration. No secondary findings of cirrhosis noted. No focal hepatic lesion or intrahepatic biliary duct dilatation. The liver surface appears nodular. Portal vein is patent on color Doppler imaging with normal direction of blood flow towards the liver. Other: None. IMPRESSION: 1. No evidence for cholelithiasis. 2. The common bile duct was not visualized. 3. Cirrhosis. Electronically Signed   By: Constance Holster M.D.   On: 06/28/2019 21:00     DG Chest 2 View  Result Date:  07/01/2019 CLINICAL DATA:  Altered mental status with fever EXAM: CHEST - 2 VIEW COMPARISON:  06/28/2019, CT chest 09/28/2011 FINDINGS: Mild cardiomegaly. No focal consolidation or pleural effusion. Aortic atherosclerosis. No pneumothorax IMPRESSION: 1. No acute focal airspace disease 2. Mild cardiomegaly Electronically Signed   By: Donavan Foil M.D.   On: 07/01/2019 21:29   CT Head Wo Contrast  Result Date: 06/28/2019 CLINICAL DATA:  Confusion. Increased confusion over the past 2 days. EXAM: CT HEAD WITHOUT CONTRAST TECHNIQUE: Contiguous axial images were obtained from the base of the skull through the vertex without intravenous contrast. COMPARISON:  Head CT and brain MRI May 2013 FINDINGS: Brain:  No intracranial hemorrhage, mass effect, or midline shift. Age related atrophy. No hydrocephalus. The basilar cisterns are patent. Moderate to advanced chronic small vessel ischemia. No evidence of territorial infarct or acute ischemia. No extra-axial or intracranial fluid collection. Vascular: Atherosclerosis of skullbase vasculature without hyperdense vessel or abnormal calcification. Skull: No fracture or focal lesion. Sinuses/Orbits: Complete opacification of left side of sphenoid sinus with high-density material. Opacification of posterior left ethmoid air cells. There is some surrounding bony sclerosis. Mild mucosal thickening of right side of sphenoid sinus. Mastoid air cells are clear. Bilateral cataract resection. Other: None. IMPRESSION: 1. No acute intracranial abnormality. 2. Age related atrophy. Moderate to advanced chronic small vessel ischemia. 3. Opacification of left sphenoid sinus and posterior ethmoid air cells with high density material. There is adjacent bony sclerosis suggesting this is chronic sinusitis, however was not seen on 2013 exam. Electronically Signed   By: Keith Rake M.D.   On: 06/28/2019 17:47   CT SOFT TISSUE NECK W CONTRAST  Result Date: 07/03/2019 CLINICAL DATA:   Provided INDICATION: Retropharyngeal/prevertebral edema, abscess, sepsis. EXAM: CT NECK WITH CONTRAST TECHNIQUE: Multidetector CT imaging of the neck was performed using the standard protocol following the bolus administration of intravenous contrast. CONTRAST:  71m OMNIPAQUE IOHEXOL 300 MG/ML  SOLN COMPARISON:  Cervical spine MRI 07/02/2019, maxillofacial CT 07/02/2019. FINDINGS: Pharynx and larynx: Streak artifact from dental restoration limits evaluation of the oral cavity. There is no appreciable discrete mass or swelling within the oral cavity or nasopharynx. Again demonstrated is abnormal prevertebral/retropharyngeal fluid or edema extending from the C1-C2 level inferiorly to the C7 level. Centered at the C5-C6 level there is prominent inflammatory stranding and phlegmon extending into the right neck soft tissues measuring 4.0 x 4.6 x 7.5 cm (for instance as seen on series 10, image 88 and series 8, image 44). Within this region at the C5 level, there is a somewhat circumscribed region of hypoattenuation along the right posterior aspect of the thyroid cartilage measuring 3.0 x 2.0 cm in transaxial dimensions (series 10, image 74). Although no well-defined peripheral enhancement is demonstrated, findings are suspicious for early abscess formation. There is associated leftward deviation of the laryngeal airway and subglottic trachea without significant airway narrowing. There is rightward displacement of the right internal jugular vein and common carotid artery. There is heterogeneous enhancement of the right thyroid lobe suggestive of thyroiditis. Salivary glands: Unremarkable. Thyroid: Heterogeneous appearance of the right thyroid lobe with extensive surrounding phlegmonous change/abscess as described below. There are few coarse calcifications within the left thyroid lobe. Lymph nodes: Right cervical chain lymphadenopathy is likely reactive. Most notably a right level III lymph node measures 13 mm in short  axis (series 8, image 30). Vascular: The major vascular structures of the neck appear patent. The right internal jugular vein is significantly narrowed at the level of the mid neck due to phlegmonous change/abscess. Limited intracranial: No abnormality identified. Visualized orbits: Excluded from the field of view. Mastoids and visualized paranasal sinuses: The paranasal sinuses are incompletely imaged. Redemonstrated complete opacification of the left sphenoid sinus with associated reactive osteitis. No significant mastoid effusion. Skeleton: No acute bony abnormality. Redemonstrated cervical spondylosis with multilevel disc space narrowing, posterior disc osteophytes as well as uncovertebral hypertrophy. Upper chest: No consolidation within the imaged lung apices. IMPRESSION: Redemonstrated abnormal prevertebral/retropharyngeal fluid or edema extending from the C1-C2 level inferiorly to the C7 level. Extensive inflammatory stranding and phlegmonous change extending into the right neck soft tissues centered at the C5 level. Findings suspicious  for an associated 3.0 x 2.0 abscess along the right posterior aspect of the thyroid cartilage at the C5 level. Heterogeneous enhancement of the right thyroid lobe suspicious for secondary thyroiditis. Findings likely reflect sequela of an infectious process. Close clinical follow-up is recommended with repeat imaging as warranted to exclude alternative etiologies (i.e. Neoplasm). Right cervical lymphadenopathy, likely reactive. Chronic left sphenoid sinusitis. Electronically Signed   By: Kellie Simmering DO   On: 07/03/2019 14:35   CT CHEST W CONTRAST  Result Date: 07/12/2019 CLINICAL DATA:  Abnormal right neck mass, concern for malignancy EXAM: CT CHEST WITH CONTRAST TECHNIQUE: Multidetector CT imaging of the chest was performed during intravenous contrast administration. CONTRAST:  61m OMNIPAQUE IOHEXOL 300 MG/ML  SOLN COMPARISON:  07/03/2019, 09/28/2011 FINDINGS:  Cardiovascular: Heart is not enlarged. No pericardial effusion. There is extensive atherosclerosis of the thoracic aorta and coronary vessels. There is a 4.6 cm ascending thoracic aortic aneurysm. No dissection. Mediastinum/Nodes: Numerous subcentimeter lymph nodes are seen within the mediastinum. Largest lymph node in the right paratracheal region image 30 measures 9 mm in short axis and in the precarinal region image 58 measures 10 mm in short axis. The esophagus and trachea are unremarkable. The infiltrating soft tissue mass involving the right lobe thyroid is again seen, measuring approximately 3.5 x 3.5 cm on image 8, previously measuring approximately 3.9 x 4.0 cm at a similar location. There is continued retropharyngeal soft tissue edema. Lungs/Pleura: There are small bilateral pleural effusions, volume estimated less than 500 cc each. Minimal dependent atelectasis greatest in the left lower lobe. No airspace disease. No pneumothorax. Central airways are patent. Upper Abdomen: Shrunken nodular liver compatible cirrhosis. Extensive upper abdominal varices are seen. Borderline splenomegaly measuring 13 cm. Musculoskeletal: No acute displaced fractures. Chronic T12 compression deformity. Reconstructed images demonstrate no additional findings. IMPRESSION: 1. Slight decrease in the size of the infiltrating soft tissue right neck mass. Continued involvement of the right lobe thyroid with retropharyngeal and prevertebral soft tissue edema. Please refer to recent biopsy results. 2. Small bilateral pleural effusions with dependent lower lobe atelectasis. 3. Nonspecific borderline enlarged mediastinal lymph nodes. 4. Cirrhosis, with evidence of portal venous hypertension. 5.  Aortic Atherosclerosis (ICD10-I70.0). 6. 4.6 cm ascending thoracic aortic aneurysm. Recommend semi-annual imaging followup by CTA or MRA and referral to cardiothoracic surgery if not already obtained. This recommendation follows 2010  ACCF/AHA/AATS/ACR/ASA/SCA/SCAI/SIR/STS/SVM Guidelines for the Diagnosis and Management of Patients With Thoracic Aortic Disease. Circulation. 2010; 121:: [id number] Aortic aneurysm NOS (ICD10-I71.9) Electronically Signed   By: MRanda NgoM.D.   On: 07/12/2019 20:34   MR CERVICAL SPINE W WO CONTRAST  Result Date: 07/03/2019 CLINICAL DATA:  Initial evaluation for acute sepsis, unknown source, concern for discitis. EXAM: MRI CERVICAL SPINE WITHOUT AND WITH CONTRAST TECHNIQUE: Multiplanar and multiecho pulse sequences of the cervical spine, to include the craniocervical junction and cervicothoracic junction, were obtained without and with intravenous contrast. CONTRAST:  92mGADAVIST GADOBUTROL 1 MMOL/ML IV SOLN COMPARISON:  Prior maxillofacial CT from earlier the same day. FINDINGS: Alignment: Mild straightening of the normal cervical lordosis. No listhesis. Vertebrae: Vertebral body height maintained without evidence for acute or chronic fracture. Bone marrow signal intensity somewhat diffusely decreased on T1 weighted imaging, nonspecific, but most commonly related to anemia, smoking, or obesity. No discrete or worrisome osseous lesions. Mild reactive endplate changes present about the C4-5 through C6-7 interspaces, degenerative in nature. No findings to suggest osteomyelitis discitis or septic arthritis. No abnormal marrow edema or enhancement. Cord: Signal intensity within  the cervical spinal cord is normal. No abnormal enhancement. No epidural abscess or other collection. Posterior Fossa, vertebral arteries, paraspinal tissues: Age-related cerebral atrophy noted within the visualized brain. Craniocervical junction normal. There is abnormal prevertebral/retropharyngeal edema extending from the nasopharynx through the upper thoracic spine. Abnormal edema with phlegmonous change seen involving the right parapharyngeal space, most pronounced at the level of the thyroid cartilage at approximately level III/IV  (series 9, image 29). Finding concerning for acute infection given provided history. There is suggestion of a superimposed abscess at this level measuring up to approximately 2 cm (series 12, image 24), incompletely visualized. No frank loculated retropharyngeal abscess or collection. Prominent right level II/III lymph node measures up to 12 mm, likely reactive (series 8, image 20). Normal flow void seen within the vertebral arteries bilaterally. Disc levels: C2-C3: Small central disc protrusion minimally indents the ventral thecal sac. No canal or foraminal stenosis. C3-C4: Moderate-sized central disc protrusion indents the ventral thecal sac, contacting and mildly flattening the ventral spinal cord. Resultant moderate spinal stenosis. Superimposed bilateral uncovertebral and facet hypertrophy. Resultant mild left greater than right C4 foraminal stenosis. C4-C5: Chronic intervertebral disc space narrowing with mild diffuse disc bulge. Right greater than left uncovertebral hypertrophy. Broad posterior disc osteophyte flattens and partially faces the ventral thecal sac, greater on the right. Resultant mild-to-moderate spinal stenosis with mild flattening of the right hemi cord. Severe right with moderate left C5 foraminal stenosis. C5-C6: Diffuse disc bulge with bilateral uncovertebral hypertrophy. Broad posterior component effaces the ventral thecal sac and resultant moderate spinal stenosis. Severe right with moderate left C6 foraminal narrowing. C6-C7: Diffuse disc bulge with bilateral uncovertebral hypertrophy. Mild ligament flavum thickening. Resultant mild spinal stenosis without cord impingement. Moderate left worse than right C7 foraminal narrowing. C7-T1:  Negative interspace.  Mild facet hypertrophy.  No stenosis. Visualized upper thoracic spine demonstrates no significant finding. IMPRESSION: 1. Abnormal retropharyngeal/prevertebral edema with associated phlegmonous change within the lower right neck as  above, concerning for acute infection. Probable superimposed abscess measuring up to approximately 2 cm, incompletely visualized on this exam. Further evaluation with dedicated soft tissue neck CT with contrast recommended for further evaluation. 2. No evidence for osteomyelitis discitis within the cervical spine. 3. Multilevel cervical spondylosis with resultant mild to moderate diffuse spinal stenosis at C3-4 through C6-7. Associated moderate to severe bilateral C5 through C7 foraminal narrowing as above. Electronically Signed   By: Jeannine Boga M.D.   On: 07/03/2019 03:29   CT Abdomen Pelvis W Contrast  Result Date: 07/01/2019 CLINICAL DATA:  Cirrhosis EXAM: CT ABDOMEN AND PELVIS WITH CONTRAST TECHNIQUE: Multidetector CT imaging of the abdomen and pelvis was performed using the standard protocol following bolus administration of intravenous contrast. CONTRAST:  178m OMNIPAQUE IOHEXOL 300 MG/ML  SOLN COMPARISON:  Ultrasound 06/28/2019, CT 06/27/2014 FINDINGS: Lower chest: Lung bases demonstrate streaky atelectasis or scarring within the bases. No acute consolidation or pleural effusion. Borderline to mild cardiomegaly. Hepatobiliary: Cirrhotic morphology of the liver. Subcentimeter hypodensities too small to further characterize. No calcified gallstone or biliary dilatation Pancreas: Unremarkable. No pancreatic ductal dilatation or surrounding inflammatory changes. Spleen: Normal in size without focal abnormality. Adrenals/Urinary Tract: Adrenal glands are normal. Cortical scarring in the mid left kidney. Negative for hydronephrosis. The bladder is normal Stomach/Bowel: Stomach is within normal limits. Appendix appears normal. No evidence of bowel wall thickening, distention, or inflammatory changes. Diverticular disease of the left colon without acute inflammatory change. Vascular/Lymphatic: Moderate aortic atherosclerosis without aneurysm. Subcentimeter retroperitoneal nodes. Cavernous  transformation  of the portal vein. Numerous varices and collateral vessels within the abdomen. Reproductive: Slightly enlarged prostate Other: Negative for free air or free fluid. Musculoskeletal: Chronic compression deformity of T12. IMPRESSION: 1. Liver cirrhosis with evidence for portal hypertension. 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 2. Left colon diverticular disease without acute inflammatory process. Electronically Signed   By: Donavan Foil M.D.   On: 07/01/2019 23:26   DG Chest Port 1 View  Result Date: 06/28/2019 CLINICAL DATA:  Increased confusion, cirrhosis EXAM: PORTABLE CHEST 1 VIEW COMPARISON:  None. FINDINGS: No consolidation or edema. No pleural effusion or pneumothorax. Cardiomediastinal contours are within normal limits for portable technique. There is calcified plaque along the aortic arch. IMPRESSION: No acute process in the chest. Electronically Signed   By: Macy Mis M.D.   On: 06/28/2019 15:49   [place] CORE BIOPSY (LYMPH NODES)  Result Date: 07/10/2019 INDICATION: Indeterminate right-sided neck mass, potentially associated with the right lobe of the thyroid with associated right cervical lymphadenopathy. Patient was admitted with concern for infection, however FNA biopsy of the dominant right-sided neck mass demonstrated atypia. As such, patient returns today for ultrasound-guided core needle biopsy of right-sided neck mass as well as ultrasound-guided core needle biopsy of right cervical lymph node for tissue diagnostic purposes. EXAM: 1. ULTRASOUND-GUIDED RIGHT NECK MASS CORE NEEDLE BIOPSY 2. ULTRASOUND-GUIDED RIGHT CERVICAL LYMPH NODE CORE NEEDLE BIOPSY COMPARISON:  Neck CT-07/03/2019; ultrasound-guided right neck mass fine-needle aspiration-07/04/2019 MEDICATIONS: None ANESTHESIA/SEDATION: Moderate (conscious) sedation was employed during this procedure. A total of Versed 0.5 mg and Fentanyl 25 mcg was  administered intravenously. Moderate Sedation Time: 14 minutes. The patient's level of consciousness and vital signs were monitored continuously by radiology nursing throughout the procedure under my direct supervision. COMPLICATIONS: None immediate. TECHNIQUE: Informed written consent was obtained from the patient after a discussion of the risks, benefits and alternatives to treatment. Questions regarding the procedure were encouraged and answered. Initial ultrasound scanning demonstrated grossly unchanged size and appearance of the at least 3.2 x 3.0 cm right sided neck mass, potentially associated with the right lobe of the thyroid (image 4). Additionally, note is made of an approximately 1.3 x 1.0 cm right cervical lymph node likely correlating with the dominant cervical lymph node seen on preceding neck CT image 58, series 3. Multiple ultrasound images were saved procedural documentation purposes. The procedure was planned. A timeout was performed prior to the initiation of the procedure. The operative sites were was prepped and draped in the usual sterile fashion, and a sterile drape was applied covering the operative field. A timeout was performed prior to the initiation of the procedure. Local anesthesia was provided with 1% lidocaine with epinephrine. Initially beginning with the dominant right cervical lymph node, an 18 gauge core needle biopsy device was utilized to obtain a 4 core needle biopsies under direct ultrasound guidance. Next, a separate 18 gauge core needle biopsy device was utilized to obtain 5 core needle biopsies of the dominant right neck mass. The samples were placed in saline and submitted separate to pathology. Superficial hemostasis was achieved with manual compression. Post procedure scan was negative for significant hematoma. Dressings were applied. The patient tolerated the procedure well without immediate postprocedural complication. IMPRESSION: 1. Technically successful ultrasound  guided biopsy of dominant right cervical lymph node. 2. Technically successful ultrasound-guided biopsy of right-sided neck mass, potentially associated with the right lobe of the thyroid. Electronically Signed   By: Eldridge Abrahams.D.  On: 07/10/2019 16:38   [place] FNA SOFT TISSUE  Result Date: 07/04/2019 INDICATION: 85 year old with a right neck mass. Request for ultrasound-guided aspiration. EXAM: ULTRASOUND-GUIDED FINE NEEDLE ASPIRATION OF RIGHT NECK MASS MEDICATIONS: None. ANESTHESIA/SEDATION: None FLUOROSCOPY TIME:  None COMPLICATIONS: None immediate. PROCEDURE: Informed written consent was obtained from the patient after a thorough discussion of the procedural risks, benefits and alternatives. All questions were addressed. A timeout was performed prior to the initiation of the procedure. Right side of the neck was evaluated with ultrasound. Right neck lesion was identified. The lesion appeared to be mostly solid, therefore, many of the specimens were collected for cytology. Four fine-needle aspirations were performed with 25 gauge needles. Two fine-needle aspirations were obtained with 18 gauge needles. Bandage placed over the puncture site. FINDINGS: Heterogeneous hypoechoic lesion that appears to be inseparable from the thyroid tissue. This soft tissue causes lateral displacement of the right common carotid artery and right internal jugular vein. No discrete fluid collections were identified. No significant fluid could be aspirated from this right neck lesion/mass. One of the 18 gauge fine-needle aspirations was collected for culture. IMPRESSION: Heterogeneous solid right neck mass. This lesion appears to be indistinguishable from the right thyroid tissue. No significant fluid could be aspirated from this right neck lesion. Ultrasound-guided fine-needle aspirations were obtained from the lesion. Electronically Signed   By: Markus Daft M.D.   On: 07/04/2019 17:42   [place] CORE BIOPSY (SOFT TISSUE)  Result  Date: 07/10/2019 INDICATION: Indeterminate right-sided neck mass, potentially associated with the right lobe of the thyroid with associated right cervical lymphadenopathy. Patient was admitted with concern for infection, however FNA biopsy of the dominant right-sided neck mass demonstrated atypia. As such, patient returns today for ultrasound-guided core needle biopsy of right-sided neck mass as well as ultrasound-guided core needle biopsy of right cervical lymph node for tissue diagnostic purposes. EXAM: 1. ULTRASOUND-GUIDED RIGHT NECK MASS CORE NEEDLE BIOPSY 2. ULTRASOUND-GUIDED RIGHT CERVICAL LYMPH NODE CORE NEEDLE BIOPSY COMPARISON:  Neck CT-07/03/2019; ultrasound-guided right neck mass fine-needle aspiration-07/04/2019 MEDICATIONS: None ANESTHESIA/SEDATION: Moderate (conscious) sedation was employed during this procedure. A total of Versed 0.5 mg and Fentanyl 25 mcg was administered intravenously. Moderate Sedation Time: 14 minutes. The patient's level of consciousness and vital signs were monitored continuously by radiology nursing throughout the procedure under my direct supervision. COMPLICATIONS: None immediate. TECHNIQUE: Informed written consent was obtained from the patient after a discussion of the risks, benefits and alternatives to treatment. Questions regarding the procedure were encouraged and answered. Initial ultrasound scanning demonstrated grossly unchanged size and appearance of the at least 3.2 x 3.0 cm right sided neck mass, potentially associated with the right lobe of the thyroid (image 4). Additionally, note is made of an approximately 1.3 x 1.0 cm right cervical lymph node likely correlating with the dominant cervical lymph node seen on preceding neck CT image 58, series 3. Multiple ultrasound images were saved procedural documentation purposes. The procedure was planned. A timeout was performed prior to the initiation of the procedure. The operative sites were was prepped and draped in  the usual sterile fashion, and a sterile drape was applied covering the operative field. A timeout was performed prior to the initiation of the procedure. Local anesthesia was provided with 1% lidocaine with epinephrine. Initially beginning with the dominant right cervical lymph node, an 18 gauge core needle biopsy device was utilized to obtain a 4 core needle biopsies under direct ultrasound guidance. Next, a separate 18 gauge core needle biopsy device was utilized  to obtain 5 core needle biopsies of the dominant right neck mass. The samples were placed in saline and submitted separate to pathology. Superficial hemostasis was achieved with manual compression. Post procedure scan was negative for significant hematoma. Dressings were applied. The patient tolerated the procedure well without immediate postprocedural complication. IMPRESSION: 1. Technically successful ultrasound guided biopsy of dominant right cervical lymph node. 2. Technically successful ultrasound-guided biopsy of right-sided neck mass, potentially associated with the right lobe of the thyroid. Electronically Signed   By: Sandi Mariscal M.D.   On: 07/10/2019 16:38   VAS [place] UPPER EXTREMITY VENOUS DUPLEX  Result Date: 07/13/2019 UPPER VENOUS STUDY  Indications: Swelling Risk Factors: None identified. Limitations: Poor ultrasound/tissue interface. Comparison Study: No prior studies. Performing Technologist: Oliver Hum RVT  Examination Guidelines: A complete evaluation includes B-mode imaging, spectral Doppler, color Doppler, and power Doppler as needed of all accessible portions of each vessel. Bilateral testing is considered an integral part of a complete examination. Limited examinations for reoccurring indications may be performed as noted.  Right Findings: +----------+------------+---------+-----------+----------+-------+ RIGHT     CompressiblePhasicitySpontaneousPropertiesSummary  +----------+------------+---------+-----------+----------+-------+ IJV           Full       Yes       Yes                      +----------+------------+---------+-----------+----------+-------+ Subclavian    Full       Yes       Yes                      +----------+------------+---------+-----------+----------+-------+ Axillary      Full       Yes       Yes                      +----------+------------+---------+-----------+----------+-------+ Brachial      Full       Yes       Yes                      +----------+------------+---------+-----------+----------+-------+ Radial        Full                                          +----------+------------+---------+-----------+----------+-------+ Ulnar         Full                                          +----------+------------+---------+-----------+----------+-------+ Cephalic      Full                                          +----------+------------+---------+-----------+----------+-------+ Basilic       Full                                          +----------+------------+---------+-----------+----------+-------+  Left Findings: +----------+------------+---------+-----------+----------+-------+ LEFT      CompressiblePhasicitySpontaneousPropertiesSummary +----------+------------+---------+-----------+----------+-------+ Subclavian    Full       Yes       Yes                      +----------+------------+---------+-----------+----------+-------+  Summary:  Right: No evidence of deep vein thrombosis in the upper extremity. No evidence of superficial vein thrombosis in the upper extremity.  Left: No evidence of thrombosis in the subclavian.  *See table(s) above for measurements and observations.    Preliminary    CT MAXILLOFACIAL WO CONTRAST  Result Date: 07/02/2019 CLINICAL DATA:  85 year old male. Sepsis. Sinus congestion. EXAM: CT MAXILLOFACIAL WITHOUT CONTRAST TECHNIQUE: Multidetector CT  imaging of the maxillofacial structures was performed. Multiplanar CT image reconstructions were also generated. COMPARISON:  Head CT 06/28/2019, brain MRI 08/11/2011. FINDINGS: Osseous: The visible cervical vertebrae appear intact (through C5) but there is abnormal prevertebral soft tissue swelling and retropharyngeal or prevertebral fluid/edema (series 3, image 16). This seems to progress caudally in the neck, and becomes eccentric to the right at the level of the thyroid cartilage on series 3, image 87. Mandible intact. No acute dental abnormality identified. Maxilla, zygoma, nasal bones, central skull base and visible calvarium appear intact. Orbits: Intact orbital walls. Postoperative changes to both globes, otherwise negative noncontrast orbits soft tissues. Sinuses: Posterior left ethmoid and left sphenoid sinus opacification with periosteal thickening. But no complicating features are identified, and the remaining paranasal sinuses are well pneumatized. Tympanic cavities and mastoids are clear. Soft tissues: Abnormal prevertebral/retropharyngeal fluid or edema tracking inferiorly in the neck as stated above. There is also a partially retropharyngeal course of the right ICA. There are reactive appearing right level 3 lymph nodes on series 3, image 85. But elsewhere the noncontrast deep soft tissue spaces of the face and neck are within normal limits. Limited intracranial: Stable, No acute intracranial abnormality. Calcified atherosclerosis at the skull base. IMPRESSION: 1. The dominant abnormality is prevertebral/retropharyngeal fluid or edema tracking which seems to increase towards the lower neck. Reactive appearing right level 3 lymph nodes. Visible cervical vertebrae appear intact, but in the setting of sepsis lower Cervical Discitis-Osteomyelitis is a leading consideration. Retropharyngeal infection is in the differential. Cervical Spine MRI without and with contrast would be most valuable. Failing that  Neck CT with IV contrast would be recommended. 2. Posterior left ethmoid and left sphenoid sinusitis, but has a chronic appearance with no complicating features. Electronically Signed   By: Genevie Ann M.D.   On: 07/02/2019 14:21   US Abdomen Limited RUQ  Result Date: 06/28/2019 CLINICAL DATA:  Altered mental status.  LFT elevation. EXAM: ULTRASOUND ABDOMEN LIMITED RIGHT UPPER QUADRANT COMPARISON:  None. FINDINGS: Gallbladder: No gallstones or wall thickening visualized. No sonographic Murphy sign noted by sonographer. Common bile duct: Diameter: The common bile duct was poorly evaluated. Liver: Diffuse increased echogenicity with slightly heterogeneous liver. Appearance typically secondary to fatty infiltration. Fibrosis secondary consideration. No secondary findings of cirrhosis noted. No focal hepatic lesion or intrahepatic biliary duct dilatation. The liver surface appears nodular. Portal vein is patent on color Doppler imaging with normal direction of blood flow towards the liver. Other: None. IMPRESSION: 1. No evidence for cholelithiasis. 2. The common bile duct was not visualized. 3. Cirrhosis. Electronically Signed   By: Constance Holster M.D.   On: 06/28/2019 21:00    Pathology:  SURGICAL PATHOLOGY  CASE: [id number]  PATIENT: Glen Finley  Surgical Pathology Report   Clinical History: no known primary, now with right sided neck mass,  initially felt to represent infection but with previous FNA demonstrates  finding worrisome for malignancy (cm)   FINAL MICROSCOPIC DIAGNOSIS:   A. SOFT TISSUE, RIGHT NECK, NEEDLE CORE BIOPSY:  - Metastatic poorly differentiated carcinoma.  - See comment.  B. LYMPH NODE, RIGHT, NEEDLE CORE BIOPSY:  - Metastatic poorly differentiated carcinoma.  - See comment.   COMMENT:   Immunohistochemical stains were performed on both specimens. The  malignant cells are positive for cytokeratin 7, TTF-1, and p16. They  are negative for p63, arginase,  cytokeratin 5/6, p40, cytokeratin 20,  glypican-3, hepar-1, p63, and napsin-A. This profile is non-specific.  However, the positive staining for TTF-1 raises the possibility of  metastatic poorly differentiated lung non-small cell carcinoma. There  is likely sufficient tumor remaining, and additional studies can be  performed upon clinician request. Dr. Vicente Males has reviewed the case  and is in essential agreement with this interpretation.   Assessment and Plan:   1.  Metastatic poorly differentiated carcinoma -07/01/2019 CT of the abdomen pelvis -cirrhosis with evidence of portal hypertension but no evidence of metastatic disease -07/03/2019 CT soft tissue neck-prevertebral/retropharyngeal fluid or edema, findings suspicious for associated 3.0 x 2.0 abscess along the right posterior aspect of the thyroid cartilage, cannot rule out other etiologies such as neoplasm -07/10/2019 ultrasound core biopsy of the right neck soft tissue and right lymph node consistent with metastatic poorly differentiated carcinoma -07/12/2019 CT of the chest with contrast-slight decrease in size in the infiltrating soft tissue right neck mass, continued involvement of the right lobe thyroid with retropharyngeal and prevertebral soft tissue edema, borderline enlarged mediastinal lymph nodes  2.  Cirrhosis 3.  Thrombocytopenia secondary to underlying liver disease 4.  Elevated AST and total bilirubin secondary to liver disease 5.  Hepatic encephalopathy 6.  Myasthenia gravis and progressive gait disorder 7.  Chronic diastolic heart failure 8.  Hypertension  PLAN: -Imaging findings and biopsy results have been discussed with the patient and his wife.  We discussed that biopsy was consistent with cancer.  IHC stains are nonspecific but are positive for TTF-1-1 which could raise possibility of non-small cell lung carcinoma however no definite mass seen on CT scan.  He has borderline enlarged mediastinal lymph nodes. ?   if mass arising from thyroid. -Given his poor performance status, he would not be likely to tolerate aggressive systemic treatment.  Best supportive care may be reasonable in his case.   Thank you for this referral.   Mikey Bussing, DNP, AGPCNP-BC, AOCNP   ADDENDUM  .Patient was Personally and independently interviewed, examined and relevant elements of the history of present illness were reviewed in details and an assessment and plan was created. All elements of the patient's history of present illness , assessment and plan were discussed in details with Mikey Bussing, DNP, AGPCNP-BC, AOCNP. The above documentation reflects our combined findings assessment and plan.  Patient is an 85 year old with very poor performance status [ECOG performance status 3] and significant burden of limiting medical comorbidities including liver cirrhosis with hepatic encephalopathy and thrombocytopenia, myasthenia gravis with progressive gait disorder, chronic diastolic CHF, hypertension, bipolar disorder, possible Parkinson's versus Lewy body dementia was admitted to the hospital on 07/01/2019 with concern for fever and altered mental status concerning for sepsis.  He was treated with broad-spectrum antibiotics and was later noted to have a right sided retropharyngeal/right neck lesion thought to be possible abscess/phlegmon. Subsequent FNA and needle core biopsy showed poorly differentiated metastatic carcinoma which is TTF-1 positive.  CT abdomen and pelvis 07/01/2019 showed -liver cirrhosis with evidence of portal hypertension.  No metastatic disease CT soft tissue neck with contrast on 07/03/2019: Redemonstrated abnormal prevertebral/retropharyngeal fluid or edema extending from the C1-C2 level inferiorly to the C7 level. Extensive inflammatory  stranding and phlegmonous change extending into the right neck soft tissues centered at the C5 level. Findings suspicious for an associated 3.0 x 2.0 abscess along  the right posterior aspect of the thyroid cartilage at the C5 level. Heterogeneous enhancement of the right thyroid lobe suspicious for secondary thyroiditis. Findings likely reflect sequela of an infectious process.  CT chest done on 07/12/2019 showed- 1. Slight decrease in the size of the infiltrating soft tissue right neck mass. Continued involvement of the right lobe thyroid with retropharyngeal and prevertebral soft tissue edema. Please refer to recent biopsy results. 2. Small bilateral pleural effusions with dependent lower lobe atelectasis. 3. Nonspecific borderline enlarged mediastinal lymph nodes.    Assessment & Plan  #1 poorly differentiated metastatic carcinoma presenting with right neck mass involving the right lobe of the thyroid.  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 No other clear metastatic disease outside of the neck though he has some nonspecific lymph nodes in the mediastinum. There certainly appears to be an associated infectious process related to the mass which has improved with antibiotics with interval radiographic improvement.  Patient's poor ECOG performance status of 3-4 and multiple other medical comorbidities make any significant systemic therapy approaches challenging.  Plan -Called wife 4 times and unable to reach her on her home phone or cell phone. -Patient has limited understanding and mental clarity to engage in detailed goals of care discussions. -Further approach will be based on the patient and his wife's goals of cares. -First step would be to have Dr. 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  -If local resection for  additional tissue and resecting visible disease R0/R1 resection is not a possibility due to unresectable tumor or poor functional status/medical risk then other treatments approaches would be palliative. -Palliative treatment approach might consider palliative RT (not a candidate for systemic chemotherapy). -if systemic treatment consideration are needed down the line - would need to get PET/CT , molecular markers on the tumor including TMB, MMR status, NTRK, RET , EGFR, ALK , BRAF mutation testing. -pursuing a best supportive care approach would also be reasonable if the patient and wife decided on this.   Sullivan Lone MD MS

## 2019-07-13 NOTE — Progress Notes (Signed)
Physical Therapy Treatment Patient Details Name: Glen Finley MRN: [id number] DOB: 05/04/1934 Today's Date: 07/13/2019    History of Present Illness Glen Finley is an 85 y.o. 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  He was recently admitted to the hospital and discharged 06/30/19 after being treated for acute hepatic encephalopathy.    PT Comments    Pt was seen for mobility progression. He fatigues quickly and becomes more unsteady as he fatigues, requiring one seated rest break during ambulation. LOB 2x requiring assist to recover. Frequent cues required for safety with RW. Pt was able to recall room number but required assist to find room. He would benefit from continued skilled PT to maximize safety with mobility and activity tolerance. Will continue to follow acutely.      Follow Up Recommendations  CIR;Supervision/Assistance - 24 hour     Equipment Recommendations  Wheelchair (measurements PT)    Recommendations for Other Services       Precautions / Restrictions Precautions Precautions: Fall Restrictions Weight Bearing Restrictions: No    Mobility  Bed Mobility Overal bed mobility: Needs Assistance Bed Mobility: Supine to Sit     Supine to sit: HOB elevated;Supervision     General bed mobility comments: supervision for safety. Increased time and effort with use of bed rails and HOB elevated.  Transfers Overall transfer level: Needs assistance Equipment used: Rolling walker (2 wheeled);None Transfers: Sit to/from Stand Sit to Stand: Min assist         General transfer comment: Pt flopped back on first attempt to rise requiring min A to power up and rise from EOB. Cues for safe hand  placement with RW.  Ambulation/Gait Ambulation/Gait assistance: Min guard;Min assist Gait Distance (Feet): 250 Feet(1 seated rest brake) Assistive device: Rolling walker (2 wheeled) Gait Pattern/deviations: Step-through pattern;Decreased stride length;Drifts right/left Gait velocity: decr   General Gait Details: Cues throughout for RW safety. After negotiating stairs, pt be same noticably fatigued and unbalance, requiring increased assistance and a seated rest brake to recover. LOB x2 requiring min A to steady. SpO2 92%, HR 64 BPM   Stairs Stairs: Yes Stairs assistance: Min assist;Mod assist Stair Management: Two rails;Forwards Number of Stairs: 3 General stair comments: Pt was able to ascend steps with min A for safety and balance. To descend the last step pt required mod A for balance and safety. He became more unsteady as he fatigued.   Wheelchair Mobility    Modified Rankin (Stroke Patients Only)       Balance Overall balance assessment: Needs assistance Sitting-balance support: Feet unsupported;Feet supported Sitting balance-Leahy Scale: Fair Sitting balance - Comments: UE support  Postural control: Right lateral lean Standing balance support: Bilateral upper extremity supported;During functional activity;No upper extremity supported Standing balance-Leahy Scale: Poor Standing balance comment: min A without device, S to minguard with walker for balance during hygiene after on [facility]                            Cognition Arousal/Alertness: Awake/alert Behavior During Therapy: WFL for tasks assessed/performed Overall Cognitive Status: History of cognitive impairments - at baseline  General Comments: Frequent cues required for safety. Pt able to recall room number but assist required to find room.       Exercises      General Comments General comments (skin integrity, edema, etc.): pt wife present for session.       Pertinent Vitals/Pain Pain Assessment: No/denies pain    Home Living                      Prior Function            PT Goals (current goals can now be found in the care plan section) Acute Rehab PT Goals Patient Stated Goal: to go home PT Goal Formulation: With patient Time For Goal Achievement: 07/17/19 Potential to Achieve Goals: Good Progress towards PT goals: Progressing toward goals    Frequency    Min 3X/week      PT Plan Current plan remains appropriate    Co-evaluation              AM-PAC PT "6 Clicks" Mobility   Outcome Measure  Help needed turning from your back to your side while in a flat bed without using bedrails?: A Little Help needed moving from lying on your back to sitting on the side of a flat bed without using bedrails?: A Little Help needed moving to and from a bed to a chair (including a wheelchair)?: A Little Help needed standing up from a chair using your arms (e.g., wheelchair or bedside chair)?: A Little Help needed to walk in hospital room?: A Little Help needed climbing 3-5 steps with a railing? : A Lot 6 Click Score: 17    End of Session Equipment Utilized During Treatment: Gait belt Activity Tolerance: Patient tolerated treatment well;Patient limited by fatigue Patient left: in chair;with call bell/phone within reach;with family/visitor present Nurse Communication: Mobility status PT Visit Diagnosis: Other symptoms and signs involving the nervous system (R29.898);Muscle weakness (generalized) (M62.81)     Time: 8563-1497 PT Time Calculation (min) (ACUTE ONLY): 27 min  Charges:  $Gait Training: 23-37 mins                    Benjiman Core, [place] Pager [phone] Acute Rehab   Allena Katz 07/13/2019, 12:04 PM

## 2019-07-13 NOTE — Progress Notes (Signed)
Right upper extremity venous duplex has been completed. Preliminary results can be found in CV Proc through chart review.   07/13/19 1:30 PM Carlos Levering RVT

## 2019-07-13 NOTE — Progress Notes (Signed)
Pathology reviewed and no infection and now antibiotics have been stopped. Will sign off. [place] Headings, MD

## 2019-07-14 DIAGNOSIS — C778 Secondary and unspecified malignant neoplasm of lymph nodes of multiple regions: Secondary | ICD-10-CM

## 2019-07-14 DIAGNOSIS — R2689 Other abnormalities of gait and mobility: Secondary | ICD-10-CM

## 2019-07-14 DIAGNOSIS — C73 Malignant neoplasm of thyroid gland: Secondary | ICD-10-CM

## 2019-07-14 DIAGNOSIS — I10 Essential (primary) hypertension: Secondary | ICD-10-CM

## 2019-07-14 DIAGNOSIS — D696 Thrombocytopenia, unspecified: Secondary | ICD-10-CM

## 2019-07-14 DIAGNOSIS — G9349 Other encephalopathy: Secondary | ICD-10-CM

## 2019-07-14 DIAGNOSIS — C760 Malignant neoplasm of head, face and neck: Secondary | ICD-10-CM

## 2019-07-14 DIAGNOSIS — C799 Secondary malignant neoplasm of unspecified site: Secondary | ICD-10-CM

## 2019-07-14 LAB — COMPREHENSIVE METABOLIC PANEL
ALT: 30 U/L (ref 0–44)
AST: 73 U/L — ABNORMAL HIGH (ref 15–41)
Albumin: 2.3 g/dL — ABNORMAL LOW (ref 3.5–5.0)
Alkaline Phosphatase: 77 U/L (ref 38–126)
Anion gap: 9 (ref 5–15)
BUN: 10 mg/dL (ref 8–23)
CO2: 29 mmol/L (ref 22–32)
Calcium: 8.6 mg/dL — ABNORMAL LOW (ref 8.9–10.3)
Chloride: 103 mmol/L (ref 98–111)
Creatinine, Ser: 0.79 mg/dL (ref 0.61–1.24)
GFR calc Af Amer: >60 mL/min (ref >60)
GFR calc non Af Amer: >60 mL/min (ref >60)
Glucose, Bld: 108 mg/dL — ABNORMAL HIGH (ref 70–99)
Potassium: 3.6 mmol/L (ref 3.5–5.1)
Sodium: 141 mmol/L (ref 135–145)
Total Bilirubin: 1.9 mg/dL — ABNORMAL HIGH (ref 0.3–1.2)
Total Protein: 5.8 g/dL — ABNORMAL LOW (ref 6.5–8.1)

## 2019-07-14 LAB — CBC WITH DIFFERENTIAL/PLATELET
Abs Immature Granulocytes: 0.02 10*3/uL (ref 0.00–0.07)
Basophils Absolute: 0.1 10*3/uL (ref 0.0–0.1)
Basophils Relative: 1 %
Eosinophils Absolute: 0.4 10*3/uL (ref 0.0–0.5)
Eosinophils Relative: 5 %
HCT: 38.8 % — ABNORMAL LOW (ref 39.0–52.0)
Hemoglobin: 13.2 g/dL (ref 13.0–17.0)
Immature Granulocytes: 0 %
Lymphocytes Relative: 33 %
Lymphs Abs: 2.9 10*3/uL (ref 0.7–4.0)
MCH: 33.5 pg (ref 26.0–34.0)
MCHC: 34 g/dL (ref 30.0–36.0)
MCV: 98.5 fL (ref 80.0–100.0)
Monocytes Absolute: 1 10*3/uL (ref 0.1–1.0)
Monocytes Relative: 11 %
Neutro Abs: 4.5 10*3/uL (ref 1.7–7.7)
Neutrophils Relative %: 50 %
Platelets: 120 10*3/uL — ABNORMAL LOW (ref 150–400)
RBC: 3.94 MIL/uL — ABNORMAL LOW (ref 4.22–5.81)
RDW: 15.6 % — ABNORMAL HIGH (ref 11.5–15.5)
WBC: 8.9 10*3/uL (ref 4.0–10.5)
nRBC: 0 % (ref 0.0–0.2)

## 2019-07-14 LAB — BLOOD GAS, VENOUS
Acid-Base Excess: 5.5 mmol/L — ABNORMAL HIGH (ref 0.0–2.0)
Bicarbonate: 29.5 mmol/L — ABNORMAL HIGH (ref 20.0–28.0)
Drawn by: 4863
FIO2: 21
O2 Saturation: 85.9 %
Patient temperature: 36.6
pCO2, Ven: 42.8 mmHg — ABNORMAL LOW (ref 44.0–60.0)
pH, Ven: 7.45 — ABNORMAL HIGH (ref 7.250–7.430)
pO2, Ven: 50.3 mmHg — ABNORMAL HIGH (ref 32.0–45.0)

## 2019-07-14 LAB — AMMONIA: Ammonia: 44 umol/L — ABNORMAL HIGH (ref 9–35)

## 2019-07-14 LAB — MAGNESIUM: Magnesium: 1.6 mg/dL — ABNORMAL LOW (ref 1.7–2.4)

## 2019-07-14 LAB — PHOSPHORUS: Phosphorus: 2.5 mg/dL (ref 2.5–4.6)

## 2019-07-14 MED ORDER — MAGNESIUM OXIDE 400 (241.3 MG) MG PO TABS
400.0000 mg | ORAL_TABLET | Freq: Two times a day (BID) | ORAL | Status: AC
  Administered 2019-07-14 – 2019-07-16 (×4): 400 mg via ORAL
  Filled 2019-07-14 (×4): qty 1

## 2019-07-14 NOTE — Progress Notes (Signed)
PROGRESS NOTE    Glen Finley  WLN:[id number] DOB: Oct 29, 1934 DOA: 07/01/2019 PCP: Shon Baton, MD   Chief Complaint  Patient presents with  . Fever  . Altered Mental Status    Brief Narrative:  85 year old male with history of myasthenia gravis/progressive gait disorder not on meds, PD vs Lewy body disease, bipolar disorder, migraine, diastolic CHF, HTN, HLD, cirrhosis, transaminitis and fatty liver disease brought to ED by EMS for fever and altered mental status and admitted for sepsis.  He was started on broad-spectrum antibiotics.  Later in the course, he was diagnosed with retropharyngeal and right neck abscess.    MRI cervical spine on 4/11 revealed abnormal retropharyngeal/prevertebral edema with associated phlegmonous change within the lower right neck concerning for acute infection with probable superimposed 2 cm abscess.  CT soft tissue neck with contrast on 4/12 redemonstrated abnormal prevertebral/retropharyngeal fluid or edema extending from the C1-C2 level inferiorly to the C7 level. Extensive inflammatory stranding and phlegmonous change extending into the right neck soft tissues centered at the C5 level. Findings suspicious for an associated 3.0 x 2.0 abscess along the right posterior aspect of the thyroid cartilage at the C5 level. Heterogeneous enhancement of the right thyroid lobe suspicious for secondary thyroiditis  ENT, ID and IR consulted.  Patient had US guided FNA of right neck mass by IR on 4/13.  Culture with rare GPC.  Blood cultures negative so far.  He was on vancomycin and ceftriaxone, and transitioned to Unasyn on 4/14 .  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  Oncology and ENT recommended repeat biopsy which was done on 4/19.  Pathology pending.  ID recommended discontinuing antibiotic if repeat biopsy confirms malignancy.  Evaluated by PT/OT who recommended CIR.  Waiting on insurance authorization and CIR bed.  Assessment & Plan:   Principal  Problem:   Retropharyngeal abscess Active Problems:   Hypertension   Myasthenia gravis without exacerbation ([place])   Acute hepatic encephalopathy   Sepsis ([place])   Liver cirrhosis ([facility])  Acute hepatic/metabolic encephalopathy, fatty liver disease, cirrhosis:  - lethargic this morning, difficult to arouse -> ammonia 44, VBG without hypercarbia, no obvious meds to me - continue to monitor for now  -Continue lactulose to 30 mg 3 times daily and titrate for 2-3 BM Shantina Chronister day. -Frequent orientation delirium precautions.  Right neck mass  Metastatic Poorly Differentiated Carcinoma: Cytology from [place] FNA by IR on 4/13 with malignant cells.  - Pathology from 4/19 with metastatic poorly differentiated carcinoma - CT abd/pelvis on admission without malignancy.  CT chest with contrast with slight decrease in size of infitrating soft tissue R neck mass with continued involvement of the R lobe thyroid with retropharngeal and prevertebral soft tissue edema, small bilateral effusions, nonspecific borderline enlarged mediastinal LN's, and cirrhosis. - discussed with oncology -> see note from 4/22 -> appreciate additional recs - Dr. Blenda Nicely from ENT following  - RUE [place] with swelling, no evidence of DVT (prelim)  Sepsis, secondary to retropharyngeal infection/right neck abscess-clinically improving. Blood cultures and MRSA PCR negative. MRI cervical spine w/wo contrast and CT soft tissue of neck with contrast as above. Tissue cultures from 4/13 with staph epidermis.  Initial cytology with malignant cells.   -Follow pathology from repeat biopsy -Vancomycin and ceftriaxone>>>Unasyn.  Will stop abx given malignancy on repeat biopsy.   Acute respiratory failure with hypoxia-likely due to the above.  Resolved.  On room air. -Encourage incentive spirometry and OOB  Chronic diastolic heart failure: Stable -Resume home Lasix. -Monitor  intake and output  Myasthenia gravis, progressive gait disorder -Follow-up  with neurology outpatient -PT/OT-recommended CIR.  CIR following.  -Plan for CIR when bed available  Essential hypertension: Blood pressure elevated. -Continue lisinopril, atenolol and hydralazine -Increase as needed hydralazine to 10 mg -Resume home Lasix  Depression: Stable -Continue amitriptyline, lexapro   Thrombocytopenia: Likely due to cirrhosis.  Appears to fluctuate widely.  -Continue monitoring  4.6 cm ascending thoracic aortic aneurysm: needs semi annual imaging and CT surgery follow up  DVT prophylaxis: Scd Code Status: full Family Communication: wife at bedside Disposition:   Status is: Inpatient  Remains inpatient appropriate because:Inpatient level of care appropriate due to severity of illness   Dispo: The patient is from: Home              Anticipated d/c is to: CIR              Anticipated d/c date is: 1 day              Patient currently is not medically stable to d/c.   Consultants:   Oncology  ENT  ID  IR  Procedures:  FNA biopsy 4/13 [place] core biopsy 4/19  Antimicrobials:  Anti-infectives (From admission, onward)   Start     Dose/Rate Route Frequency Ordered Stop   07/07/19 0000  amoxicillin-clavulanate (AUGMENTIN) 875-125 MG tablet     1 tablet Oral 2 times daily 07/07/19 0940 07/28/19 2359   07/05/19 1300  Ampicillin-Sulbactam (UNASYN) 3 g in sodium chloride 0.9 % 100 mL IVPB  Status:  Discontinued     3 g 200 mL/hr over 30 Minutes Intravenous Every 6 hours 07/05/19 1226 07/12/19 1643   07/04/19 2300  vancomycin (VANCOREADY) IVPB 1750 mg/350 mL  Status:  Discontinued     1,750 mg 175 mL/hr over 120 Minutes Intravenous Every 24 hours 07/04/19 0754 07/05/19 1226   07/03/19 1600  cefTRIAXone (ROCEPHIN) 2 g in sodium chloride 0.9 % 100 mL IVPB  Status:  Discontinued     2 g 200 mL/hr over 30 Minutes Intravenous Every 24 hours 07/03/19 1437 07/05/19 1226   07/02/19 2300  vancomycin (VANCOREADY) IVPB 1500 mg/300 mL  Status:  Discontinued      1,500 mg 150 mL/hr over 120 Minutes Intravenous Every 24 hours 07/01/19 2223 07/04/19 0754   07/02/19 0600  ceFEPIme (MAXIPIME) 2 g in sodium chloride 0.9 % 100 mL IVPB  Status:  Discontinued     2 g 200 mL/hr over 30 Minutes Intravenous Every 8 hours 07/01/19 2223 07/03/19 1437   07/02/19 0600  metroNIDAZOLE (FLAGYL) IVPB 500 mg  Status:  Discontinued     500 mg 100 mL/hr over 60 Minutes Intravenous Every 8 hours 07/01/19 2350 07/05/19 1226   07/01/19 2130  vancomycin (VANCOREADY) IVPB 1750 mg/350 mL     1,750 mg 175 mL/hr over 120 Minutes Intravenous  Once 07/01/19 2115 07/02/19 0235   07/01/19 2115  ceFEPIme (MAXIPIME) 2 g in sodium chloride 0.9 % 100 mL IVPB  Status:  Discontinued     2 g 200 mL/hr over 30 Minutes Intravenous Every 12 hours 07/01/19 2109 07/01/19 2223   07/01/19 2115  metroNIDAZOLE (FLAGYL) IVPB 500 mg     500 mg 100 mL/hr over 60 Minutes Intravenous  Once 07/01/19 2109 07/02/19 0025   07/01/19 2115  vancomycin (VANCOCIN) IVPB 1000 mg/200 mL premix  Status:  Discontinued     1,000 mg 200 mL/hr over 60 Minutes Intravenous  Once 07/01/19 2109 07/01/19 2115  Subjective: Lethargic, no new complaints  Objective: Vitals:   07/13/19 2135 07/14/19 0348 07/14/19 0615 07/14/19 0737  BP: (!) 137/57 (!) 152/67 (!) 152/58 (!) 121/58  Pulse: 72 68 72 62  Resp: 20 (!) 22 (!) 21 20  Temp: 98 F (36.7 C) 98 F (36.7 C) 98 F (36.7 C) 97.9 F (36.6 C)  TempSrc: Oral Axillary Oral Oral  SpO2: 96% 97% 95% 92%  Weight:  92.2 kg      Intake/Output Summary (Last 24 hours) at 07/14/2019 1537 Last data filed at 07/14/2019 0910 Gross per 24 hour  Intake 30 ml  Output 900 ml  Net -870 ml   Filed Weights   07/12/19 0556 07/13/19 0337 07/14/19 0348  Weight: 92.2 kg 90.4 kg 92.2 kg    Examination:  General: No acute distress. HEENT: R neck mass Cardiovascular: Heart sounds show Maicey Barrientez regular rate, and rhythm Lungs: Clear to auscultation bilaterally Abdomen: Soft,  nontender, nondistended  Neurological: lethargic, awakens to voice and answers questions and follows commands. Moves all extremities 4 . Cranial nerves II through XII grossly intact. Skin: Warm and dry. No rashes or lesions. Extremities: No clubbing or cyanosis. No edema.  Data Reviewed: I have personally reviewed following labs and imaging studies  CBC: Recent Labs  Lab 07/08/19 0810 07/10/19 0103 07/12/19 0023 07/13/19 0416 07/14/19 0239  WBC 5.5 6.6 5.1 7.4 8.9  NEUTROABS  --   --   --  3.4 4.5  HGB 12.6* 12.6* 12.8* 13.1 13.2  HCT 37.4* 37.2* 36.2* 38.0* 38.8*  MCV 100.5* 99.7 97.6 99.2 98.5  PLT 112* 123* 63* 117* 120*    Basic Metabolic Panel: Recent Labs  Lab 07/08/19 0810 07/10/19 0103 07/12/19 0023 07/13/19 0416 07/14/19 0239  NA 144 144 137 143 141  K 3.5 3.6 3.4* 3.6 3.6  CL 109 111 105 106 103  CO2 25 23 23 29 29   GLUCOSE 94 87 94 94 108*  BUN 17 15 14 10 10   CREATININE 0.97 0.78 0.93 0.90 0.79  CALCIUM 8.4* 8.3* 8.3* 8.5* 8.6*  MG 1.7 1.5* 1.5* 1.5* 1.6*  PHOS 2.4* 1.9* 2.6 2.7 2.5    GFR: Estimated Creatinine Clearance: 78.5 mL/min (by C-G formula based on SCr of 0.79 mg/dL).  Liver Function Tests: Recent Labs  Lab 07/08/19 0810 07/13/19 0416 07/14/19 0239  AST  --  72* 73*  ALT  --  30 30  ALKPHOS  --  73 77  BILITOT  --  1.6* 1.9*  PROT  --  5.9* 5.8*  ALBUMIN 2.2* 2.3* 2.3*    CBG: No results for input(s): GLUCAP in the last 168 hours.   Recent Results (from the past 240 hour(s))  MRSA PCR Screening     Status: None   Collection Time: 07/05/19 11:18 AM   Specimen: Nasal Mucosa; Nasopharyngeal  Result Value Ref Range Status   MRSA by PCR NEGATIVE NEGATIVE Final    Comment:        The GeneXpert MRSA Assay (FDA approved for NASAL specimens only), is one component of Tanikka Bresnan comprehensive MRSA colonization surveillance program. It is not intended to diagnose MRSA infection nor to guide or monitor treatment for MRSA  infections. Performed at [facility], [place] [address], [place], [place] [address]          Radiology Studies: CT CHEST W CONTRAST  Result Date: 07/12/2019 CLINICAL DATA:  Abnormal right neck mass, concern for malignancy EXAM: CT CHEST WITH CONTRAST TECHNIQUE: Multidetector CT  imaging of the chest was performed during intravenous contrast administration. CONTRAST:  11mL OMNIPAQUE IOHEXOL 300 MG/ML  SOLN COMPARISON:  07/03/2019, 09/28/2011 FINDINGS: Cardiovascular: Heart is not enlarged. No pericardial effusion. There is extensive atherosclerosis of the thoracic aorta and coronary vessels. There is Joany Khatib 4.6 cm ascending thoracic aortic aneurysm. No dissection. Mediastinum/Nodes: Numerous subcentimeter lymph nodes are seen within the mediastinum. Largest lymph node in the right paratracheal region image 30 measures 9 mm in short axis and in the precarinal region image 58 measures 10 mm in short axis. The esophagus and trachea are unremarkable. The infiltrating soft tissue mass involving the right lobe thyroid is again seen, measuring approximately 3.5 x 3.5 cm on image 8, previously measuring approximately 3.9 x 4.0 cm at Derionna Salvador similar location. There is continued retropharyngeal soft tissue edema. Lungs/Pleura: There are small bilateral pleural effusions, volume estimated less than 500 cc each. Minimal dependent atelectasis greatest in the left lower lobe. No airspace disease. No pneumothorax. Central airways are patent. Upper Abdomen: Shrunken nodular liver compatible cirrhosis. Extensive upper abdominal varices are seen. Borderline splenomegaly measuring 13 cm. Musculoskeletal: No acute displaced fractures. Chronic T12 compression deformity. Reconstructed images demonstrate no additional findings. IMPRESSION: 1. Slight decrease in the size of the infiltrating soft tissue right neck mass. Continued involvement of the right lobe thyroid with retropharyngeal and prevertebral soft tissue edema. Please refer  to recent biopsy results. 2. Small bilateral pleural effusions with dependent lower lobe atelectasis. 3. Nonspecific borderline enlarged mediastinal lymph nodes. 4. Cirrhosis, with evidence of portal venous hypertension. 5.  Aortic Atherosclerosis (ICD10-I70.0). 6. 4.6 cm ascending thoracic aortic aneurysm. Recommend semi-annual imaging followup by CTA or MRA and referral to cardiothoracic surgery if not already obtained. This recommendation follows 2010 ACCF/AHA/AATS/ACR/ASA/SCA/SCAI/SIR/STS/SVM Guidelines for the Diagnosis and Management of Patients With Thoracic Aortic Disease. Circulation. 2010; 121: D176-H607. Aortic aneurysm NOS (ICD10-I71.9) Electronically Signed   By: Randa Ngo M.D.   On: 07/12/2019 20:34   VAS [place] UPPER EXTREMITY VENOUS DUPLEX  Result Date: 07/13/2019 UPPER VENOUS STUDY  Indications: Swelling Risk Factors: None identified. Limitations: Poor ultrasound/tissue interface. Comparison Study: No prior studies. Performing Technologist: Oliver Hum RVT  Examination Guidelines: Shalva Rozycki complete evaluation includes B-mode imaging, spectral Doppler, color Doppler, and power Doppler as needed of all accessible portions of each vessel. Bilateral testing is considered an integral part of Vonetta Foulk complete examination. Limited examinations for reoccurring indications may be performed as noted.  Right Findings: +----------+------------+---------+-----------+----------+-------+ RIGHT     CompressiblePhasicitySpontaneousPropertiesSummary +----------+------------+---------+-----------+----------+-------+ IJV           Full       Yes       Yes                      +----------+------------+---------+-----------+----------+-------+ Subclavian    Full       Yes       Yes                      +----------+------------+---------+-----------+----------+-------+ Axillary      Full       Yes       Yes                      +----------+------------+---------+-----------+----------+-------+  Brachial      Full       Yes       Yes                      +----------+------------+---------+-----------+----------+-------+  Radial        Full                                          +----------+------------+---------+-----------+----------+-------+ Ulnar         Full                                          +----------+------------+---------+-----------+----------+-------+ Cephalic      Full                                          +----------+------------+---------+-----------+----------+-------+ Basilic       Full                                          +----------+------------+---------+-----------+----------+-------+  Left Findings: +----------+------------+---------+-----------+----------+-------+ LEFT      CompressiblePhasicitySpontaneousPropertiesSummary +----------+------------+---------+-----------+----------+-------+ Subclavian    Full       Yes       Yes                      +----------+------------+---------+-----------+----------+-------+  Summary:  Right: No evidence of deep vein thrombosis in the upper extremity. No evidence of superficial vein thrombosis in the upper extremity.  Left: No evidence of thrombosis in the subclavian.  *See table(s) above for measurements and observations.  Diagnosing physician: Monica Martinez MD Electronically signed by Monica Martinez MD on 07/13/2019 at 8:10:02 PM.    Final         Scheduled Meds: . amitriptyline  25 mg Oral QHS  . atenolol  25 mg Oral q AM  . escitalopram  10 mg Oral q AM  . furosemide  20 mg Oral Daily  . hydrALAZINE  50 mg Oral Q8H  . lactulose  30 g Oral TID  . lisinopril  20 mg Oral BID  . sodium chloride flush  10-40 mL Intracatheter Q12H   Continuous Infusions:    LOS: 13 days    Time spent: over 30 min    Fayrene Helper, MD [facility]   To contact the attending provider between 7A-7P or the covering provider during after hours 7P-7A, please log  into the web site [email or web address] and access using universal [place] password for that web site. If you do not have the password, please call the hospital operator.  07/14/2019, 3:37 PM

## 2019-07-14 NOTE — Progress Notes (Signed)
Patient care plan reviewed. No acute change. Patient denies pain or any acute distress. VS stable. BP 152/67 (93). HR 68. SpO2 97% on RA. RR 22. Will continue to monitor.  Adella Hare, RN

## 2019-07-14 NOTE — Progress Notes (Signed)
Inpatient Rehab Admissions Coordinator:    I have a bed available for this pt to admit to CIR on Sunday (4/25) and Dr. Florene Glen in agreement.  Rehab MD (Dr. Letta Pate) to assess pt and confirm admission on Sunday.  Floor RN can call CIR at [phone] for report after 12pm.  I will let pt/family and case manager know.   Shann Medal, PT, DPT Admissions Coordinator [phone] 07/14/19  2:29 PM

## 2019-07-15 LAB — CBC WITH DIFFERENTIAL/PLATELET
Abs Immature Granulocytes: 0.04 10*3/uL (ref 0.00–0.07)
Basophils Absolute: 0.1 10*3/uL (ref 0.0–0.1)
Basophils Relative: 1 %
Eosinophils Absolute: 0.3 10*3/uL (ref 0.0–0.5)
Eosinophils Relative: 4 %
HCT: 39.8 % (ref 39.0–52.0)
Hemoglobin: 13.7 g/dL (ref 13.0–17.0)
Immature Granulocytes: 1 %
Lymphocytes Relative: 30 %
Lymphs Abs: 2.3 10*3/uL (ref 0.7–4.0)
MCH: 33.8 pg (ref 26.0–34.0)
MCHC: 34.4 g/dL (ref 30.0–36.0)
MCV: 98.3 fL (ref 80.0–100.0)
Monocytes Absolute: 0.8 10*3/uL (ref 0.1–1.0)
Monocytes Relative: 10 %
Neutro Abs: 4.1 10*3/uL (ref 1.7–7.7)
Neutrophils Relative %: 54 %
Platelets: 107 10*3/uL — ABNORMAL LOW (ref 150–400)
RBC: 4.05 MIL/uL — ABNORMAL LOW (ref 4.22–5.81)
RDW: 15.9 % — ABNORMAL HIGH (ref 11.5–15.5)
WBC: 7.7 10*3/uL (ref 4.0–10.5)
nRBC: 0 % (ref 0.0–0.2)

## 2019-07-15 LAB — PHOSPHORUS: Phosphorus: 2.5 mg/dL (ref 2.5–4.6)

## 2019-07-15 LAB — MAGNESIUM: Magnesium: 1.7 mg/dL (ref 1.7–2.4)

## 2019-07-15 LAB — COMPREHENSIVE METABOLIC PANEL
ALT: 28 U/L (ref 0–44)
AST: 68 U/L — ABNORMAL HIGH (ref 15–41)
Albumin: 2.3 g/dL — ABNORMAL LOW (ref 3.5–5.0)
Alkaline Phosphatase: 69 U/L (ref 38–126)
Anion gap: 9 (ref 5–15)
BUN: 7 mg/dL — ABNORMAL LOW (ref 8–23)
CO2: 28 mmol/L (ref 22–32)
Calcium: 8.3 mg/dL — ABNORMAL LOW (ref 8.9–10.3)
Chloride: 105 mmol/L (ref 98–111)
Creatinine, Ser: 0.84 mg/dL (ref 0.61–1.24)
GFR calc Af Amer: >60 mL/min (ref >60)
GFR calc non Af Amer: >60 mL/min (ref >60)
Glucose, Bld: 96 mg/dL (ref 70–99)
Potassium: 3.4 mmol/L — ABNORMAL LOW (ref 3.5–5.1)
Sodium: 142 mmol/L (ref 135–145)
Total Bilirubin: 2.1 mg/dL — ABNORMAL HIGH (ref 0.3–1.2)
Total Protein: 6 g/dL — ABNORMAL LOW (ref 6.5–8.1)

## 2019-07-15 MED ORDER — POTASSIUM CHLORIDE CRYS ER 20 MEQ PO TBCR
40.0000 meq | EXTENDED_RELEASE_TABLET | Freq: Once | ORAL | Status: AC
  Administered 2019-07-15: 15:00:00 40 meq via ORAL
  Filled 2019-07-15: qty 2

## 2019-07-15 NOTE — Progress Notes (Signed)
PROGRESS NOTE    Glen Finley  UKG:[id number] DOB: 1934/12/11 DOA: 07/01/2019 PCP: Shon Baton, MD   Chief Complaint  Patient presents with  . Fever  . Altered Mental Status    Brief Narrative:  85 year old male with history of myasthenia gravis/progressive gait disorder not on meds, PD vs Lewy body disease, bipolar disorder, migraine, diastolic CHF, HTN, HLD, cirrhosis, transaminitis and fatty liver disease brought to ED by EMS for fever and altered mental status and admitted for sepsis.  He was started on broad-spectrum antibiotics.  Later in the course, he was diagnosed with retropharyngeal and right neck abscess.    MRI cervical spine on 4/11 revealed abnormal retropharyngeal/prevertebral edema with associated phlegmonous change within the lower right neck concerning for acute infection with probable superimposed 2 cm abscess.  CT soft tissue neck with contrast on 4/12 redemonstrated abnormal prevertebral/retropharyngeal fluid or edema extending from the C1-C2 level inferiorly to the C7 level. Extensive inflammatory stranding and phlegmonous change extending into the right neck soft tissues centered at the C5 level. Findings suspicious for an associated 3.0 x 2.0 abscess along the right posterior aspect of the thyroid cartilage at the C5 level. Heterogeneous enhancement of the right thyroid lobe suspicious for secondary thyroiditis  ENT, ID and IR consulted.  Patient had US guided FNA of right neck mass by IR on 4/13.  Culture with rare GPC.  Blood cultures negative so far.  He was on vancomycin and ceftriaxone, and transitioned to Unasyn on 4/14 .  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  Oncology and ENT recommended repeat biopsy which was done on 4/19.  Pathology pending.  ID recommended discontinuing antibiotic if repeat biopsy confirms malignancy.  Evaluated by PT/OT who recommended CIR.  Waiting on insurance authorization and CIR bed.  Assessment & Plan:   Principal  Problem:   Retropharyngeal abscess Active Problems:   Hypertension   Myasthenia gravis without exacerbation (HCC)   Acute hepatic encephalopathy   Sepsis ([place])   Liver cirrhosis (HCC)   Metastatic carcinoma (HCC)   Thyroid cancer (HCC)  Acute hepatic/metabolic encephalopathy, fatty liver disease, cirrhosis:  - lethargic this morning, difficult to arouse -> ammonia 44, VBG without hypercarbia, no obvious meds to me - continue to monitor for now - this is improved today -Continue lactulose to 30 mg 3 times daily and titrate for 2-3 BM Yahmir Sokolov day. -Frequent orientation delirium precautions.  Right neck mass  Metastatic Poorly Differentiated Carcinoma: Cytology from [place] FNA by IR on 4/13 with malignant cells.  - Pathology from 4/19 with metastatic poorly differentiated carcinoma - CT abd/pelvis on admission without malignancy.  CT chest with contrast with slight decrease in size of infitrating soft tissue R neck mass with continued involvement of the R lobe thyroid with retropharngeal and prevertebral soft tissue edema, small bilateral effusions, nonspecific borderline enlarged mediastinal LN's, and cirrhosis. - discussed with oncology -> see note from 4/22 -> recommending ENT determine if locally resectable tumor?  - Dr. Blenda Nicely from ENT following - attempted to call [facility] ENT, but unable to reach today - RUE [place] with swelling, no evidence of DVT (prelim)  Sepsis, secondary to retropharyngeal infection/right neck abscess-clinically improving. Blood cultures and MRSA PCR negative. MRI cervical spine w/wo contrast and CT soft tissue of neck with contrast as above. Tissue cultures from 4/13 with staph epidermis.  Initial cytology with malignant cells.   -Follow pathology from repeat biopsy -Vancomycin and ceftriaxone>>>Unasyn.  Will stop abx given malignancy on repeat biopsy.   Acute  respiratory failure with hypoxia-likely due to the above.  Resolved.  On room air. -Encourage incentive  spirometry and OOB  Chronic diastolic heart failure: Stable -Resume home Lasix. -Monitor intake and output  Myasthenia gravis, progressive gait disorder -Follow-up with neurology outpatient -PT/OT-recommended CIR.  CIR following.  -Plan for CIR when bed available  Essential hypertension: Blood pressure elevated. -Continue lisinopril, atenolol and hydralazine -Increase as needed hydralazine to 10 mg -Resume home Lasix  Depression: Stable -Continue amitriptyline, lexapro   Thrombocytopenia: Likely due to cirrhosis.  Appears to fluctuate widely.  -Continue monitoring  Hypokalemia: replace and follow  4.6 cm ascending thoracic aortic aneurysm: needs semi annual imaging and CT surgery follow up  DVT prophylaxis: Scd Code Status: full Family Communication: wife at bedside Disposition:   Status is: Inpatient  Remains inpatient appropriate because:Inpatient level of care appropriate due to severity of illness   Dispo: The patient is from: Home              Anticipated d/c is to: CIR              Anticipated d/c date is: 1 day              Patient currently is not medically stable to d/c.   Consultants:   Oncology  ENT  ID  IR  Procedures:  FNA biopsy 4/13 [place] core biopsy 4/19  Antimicrobials:  Anti-infectives (From admission, onward)   Start     Dose/Rate Route Frequency Ordered Stop   07/07/19 0000  amoxicillin-clavulanate (AUGMENTIN) 875-125 MG tablet     1 tablet Oral 2 times daily 07/07/19 0940 07/28/19 2359   07/05/19 1300  Ampicillin-Sulbactam (UNASYN) 3 g in sodium chloride 0.9 % 100 mL IVPB  Status:  Discontinued     3 g 200 mL/hr over 30 Minutes Intravenous Every 6 hours 07/05/19 1226 07/12/19 1643   07/04/19 2300  vancomycin (VANCOREADY) IVPB 1750 mg/350 mL  Status:  Discontinued     1,750 mg 175 mL/hr over 120 Minutes Intravenous Every 24 hours 07/04/19 0754 07/05/19 1226   07/03/19 1600  cefTRIAXone (ROCEPHIN) 2 g in sodium chloride 0.9 % 100  mL IVPB  Status:  Discontinued     2 g 200 mL/hr over 30 Minutes Intravenous Every 24 hours 07/03/19 1437 07/05/19 1226   07/02/19 2300  vancomycin (VANCOREADY) IVPB 1500 mg/300 mL  Status:  Discontinued     1,500 mg 150 mL/hr over 120 Minutes Intravenous Every 24 hours 07/01/19 2223 07/04/19 0754   07/02/19 0600  ceFEPIme (MAXIPIME) 2 g in sodium chloride 0.9 % 100 mL IVPB  Status:  Discontinued     2 g 200 mL/hr over 30 Minutes Intravenous Every 8 hours 07/01/19 2223 07/03/19 1437   07/02/19 0600  metroNIDAZOLE (FLAGYL) IVPB 500 mg  Status:  Discontinued     500 mg 100 mL/hr over 60 Minutes Intravenous Every 8 hours 07/01/19 2350 07/05/19 1226   07/01/19 2130  vancomycin (VANCOREADY) IVPB 1750 mg/350 mL     1,750 mg 175 mL/hr over 120 Minutes Intravenous  Once 07/01/19 2115 07/02/19 0235   07/01/19 2115  ceFEPIme (MAXIPIME) 2 g in sodium chloride 0.9 % 100 mL IVPB  Status:  Discontinued     2 g 200 mL/hr over 30 Minutes Intravenous Every 12 hours 07/01/19 2109 07/01/19 2223   07/01/19 2115  metroNIDAZOLE (FLAGYL) IVPB 500 mg     500 mg 100 mL/hr over 60 Minutes Intravenous  Once 07/01/19 2109 07/02/19 0025  07/01/19 2115  vancomycin (VANCOCIN) IVPB 1000 mg/200 mL premix  Status:  Discontinued     1,000 mg 200 mL/hr over 60 Minutes Intravenous  Once 07/01/19 2109 07/01/19 2115      Subjective: No complaints today, more awake  Objective: Vitals:   07/15/19 0409 07/15/19 0500 07/15/19 0700 07/15/19 0859  BP: (!) 167/71  (!) 153/69 (!) 117/54  Pulse: 91   (!) 58  Resp: (!) 23  (!) 21 18  Temp: 98.1 F (36.7 C)   98.1 F (36.7 C)  TempSrc: Oral   Oral  SpO2: 94%   92%  Weight:  87.9 kg      Intake/Output Summary (Last 24 hours) at 07/15/2019 1322 Last data filed at 07/15/2019 0906 Gross per 24 hour  Intake 490 ml  Output 401 ml  Net 89 ml   Filed Weights   07/13/19 0337 07/14/19 0348 07/15/19 0500  Weight: 90.4 kg 92.2 kg 87.9 kg    Examination:  General: No acute  distress. HEENT: r neck mass Cardiovascular: Heart sounds show Jeric Slagel regular rate, and rhythm Lungs: Clear to auscultation bilaterally  Abdomen: Soft, nontender, nondistended  Neurological: Alert and oriented 3. Moves all extremities 4 with equal strength. Cranial nerves II through XII grossly intact. Skin: Warm and dry. No rashes or lesions. Extremities: No clubbing or cyanosis. No edema.  Data Reviewed: I have personally reviewed following labs and imaging studies  CBC: Recent Labs  Lab 07/10/19 0103 07/12/19 0023 07/13/19 0416 07/14/19 0239 07/15/19 0236  WBC 6.6 5.1 7.4 8.9 7.7  NEUTROABS  --   --  3.4 4.5 4.1  HGB 12.6* 12.8* 13.1 13.2 13.7  HCT 37.2* 36.2* 38.0* 38.8* 39.8  MCV 99.7 97.6 99.2 98.5 98.3  PLT 123* 63* 117* 120* 107*    Basic Metabolic Panel: Recent Labs  Lab 07/10/19 0103 07/12/19 0023 07/13/19 0416 07/14/19 0239 07/15/19 0236  NA 144 137 143 141 142  K 3.6 3.4* 3.6 3.6 3.4*  CL 111 105 106 103 105  CO2 23 23 29 29 28   GLUCOSE 87 94 94 108* 96  BUN 15 14 10 10  7*  CREATININE 0.78 0.93 0.90 0.79 0.84  CALCIUM 8.3* 8.3* 8.5* 8.6* 8.3*  MG 1.5* 1.5* 1.5* 1.6* 1.7  PHOS 1.9* 2.6 2.7 2.5 2.5    GFR: Estimated Creatinine Clearance: 73.1 mL/min (by C-G formula based on SCr of 0.84 mg/dL).  Liver Function Tests: Recent Labs  Lab 07/13/19 0416 07/14/19 0239 07/15/19 0236  AST 72* 73* 68*  ALT 30 30 28   ALKPHOS 73 77 69  BILITOT 1.6* 1.9* 2.1*  PROT 5.9* 5.8* 6.0*  ALBUMIN 2.3* 2.3* 2.3*    CBG: No results for input(s): GLUCAP in the last 168 hours.   No results found for this or any previous visit (from the past 240 hour(s)).       Radiology Studies: VAS [place] UPPER EXTREMITY VENOUS DUPLEX  Result Date: 07/13/2019 UPPER VENOUS STUDY  Indications: Swelling Risk Factors: None identified. Limitations: Poor ultrasound/tissue interface. Comparison Study: No prior studies. Performing Technologist: Oliver Hum RVT  Examination  Guidelines: Domonique Brouillard complete evaluation includes B-mode imaging, spectral Doppler, color Doppler, and power Doppler as needed of all accessible portions of each vessel. Bilateral testing is considered an integral part of Amiyrah Lamere complete examination. Limited examinations for reoccurring indications may be performed as noted.  Right Findings: +----------+------------+---------+-----------+----------+-------+ RIGHT     CompressiblePhasicitySpontaneousPropertiesSummary +----------+------------+---------+-----------+----------+-------+ IJV           Full  Yes       Yes                      +----------+------------+---------+-----------+----------+-------+ Subclavian    Full       Yes       Yes                      +----------+------------+---------+-----------+----------+-------+ Axillary      Full       Yes       Yes                      +----------+------------+---------+-----------+----------+-------+ Brachial      Full       Yes       Yes                      +----------+------------+---------+-----------+----------+-------+ Radial        Full                                          +----------+------------+---------+-----------+----------+-------+ Ulnar         Full                                          +----------+------------+---------+-----------+----------+-------+ Cephalic      Full                                          +----------+------------+---------+-----------+----------+-------+ Basilic       Full                                          +----------+------------+---------+-----------+----------+-------+  Left Findings: +----------+------------+---------+-----------+----------+-------+ LEFT      CompressiblePhasicitySpontaneousPropertiesSummary +----------+------------+---------+-----------+----------+-------+ Subclavian    Full       Yes       Yes                       +----------+------------+---------+-----------+----------+-------+  Summary:  Right: No evidence of deep vein thrombosis in the upper extremity. No evidence of superficial vein thrombosis in the upper extremity.  Left: No evidence of thrombosis in the subclavian.  *See table(s) above for measurements and observations.  Diagnosing physician: Monica Martinez MD Electronically signed by Monica Martinez MD on 07/13/2019 at 8:10:02 PM.    Final         Scheduled Meds: . amitriptyline  25 mg Oral QHS  . atenolol  25 mg Oral q AM  . escitalopram  10 mg Oral q AM  . furosemide  20 mg Oral Daily  . hydrALAZINE  50 mg Oral Q8H  . lactulose  30 g Oral TID  . lisinopril  20 mg Oral BID  . magnesium oxide  400 mg Oral BID  . sodium chloride flush  10-40 mL Intracatheter Q12H   Continuous Infusions:    LOS: 14 days    Time spent: over 30 min    Fayrene Helper, MD [facility]   To contact the attending provider between 7A-7P or the covering provider  during after hours 7P-7A, please log into the web site [email or web address] and access using universal [place] password for that web site. If you do not have the password, please call the hospital operator.  07/15/2019, 1:22 PM

## 2019-07-16 LAB — MAGNESIUM: Magnesium: 1.8 mg/dL (ref 1.7–2.4)

## 2019-07-16 LAB — COMPREHENSIVE METABOLIC PANEL
ALT: 25 U/L (ref 0–44)
AST: 59 U/L — ABNORMAL HIGH (ref 15–41)
Albumin: 2.3 g/dL — ABNORMAL LOW (ref 3.5–5.0)
Alkaline Phosphatase: 65 U/L (ref 38–126)
Anion gap: 8 (ref 5–15)
BUN: 11 mg/dL (ref 8–23)
CO2: 27 mmol/L (ref 22–32)
Calcium: 8.3 mg/dL — ABNORMAL LOW (ref 8.9–10.3)
Chloride: 107 mmol/L (ref 98–111)
Creatinine, Ser: 0.89 mg/dL (ref 0.61–1.24)
GFR calc Af Amer: >60 mL/min (ref >60)
GFR calc non Af Amer: >60 mL/min (ref >60)
Glucose, Bld: 88 mg/dL (ref 70–99)
Potassium: 3.8 mmol/L (ref 3.5–5.1)
Sodium: 142 mmol/L (ref 135–145)
Total Bilirubin: 1.9 mg/dL — ABNORMAL HIGH (ref 0.3–1.2)
Total Protein: 6 g/dL — ABNORMAL LOW (ref 6.5–8.1)

## 2019-07-16 LAB — CBC WITH DIFFERENTIAL/PLATELET
Abs Immature Granulocytes: 0.02 10*3/uL (ref 0.00–0.07)
Basophils Absolute: 0.1 10*3/uL (ref 0.0–0.1)
Basophils Relative: 1 %
Eosinophils Absolute: 0.4 10*3/uL (ref 0.0–0.5)
Eosinophils Relative: 5 %
HCT: 40.7 % (ref 39.0–52.0)
Hemoglobin: 13.5 g/dL (ref 13.0–17.0)
Immature Granulocytes: 0 %
Lymphocytes Relative: 39 %
Lymphs Abs: 2.7 10*3/uL (ref 0.7–4.0)
MCH: 33.7 pg (ref 26.0–34.0)
MCHC: 33.2 g/dL (ref 30.0–36.0)
MCV: 101.5 fL — ABNORMAL HIGH (ref 80.0–100.0)
Monocytes Absolute: 0.8 10*3/uL (ref 0.1–1.0)
Monocytes Relative: 11 %
Neutro Abs: 3.1 10*3/uL (ref 1.7–7.7)
Neutrophils Relative %: 44 %
Platelets: 111 10*3/uL — ABNORMAL LOW (ref 150–400)
RBC: 4.01 MIL/uL — ABNORMAL LOW (ref 4.22–5.81)
RDW: 16.3 % — ABNORMAL HIGH (ref 11.5–15.5)
WBC: 7 10*3/uL (ref 4.0–10.5)
nRBC: 0 % (ref 0.0–0.2)

## 2019-07-16 LAB — PHOSPHORUS: Phosphorus: 2.5 mg/dL (ref 2.5–4.6)

## 2019-07-16 NOTE — Progress Notes (Signed)
Discussed with [facility] hospital[facility] service, Dr. Florene Glen.  Patient has ENT evaluation pending.  There is a possibility that surgery may be recommended and therefore will wait until ENT eval complete before making decision to admit to rehabilitation.

## 2019-07-16 NOTE — Progress Notes (Signed)
PROGRESS NOTE    Glen Finley  OLM:[id number] DOB: 1934/07/23 DOA: 07/01/2019 PCP: Shon Baton, MD   Chief Complaint  Patient presents with  . Fever  . Altered Mental Status    Brief Narrative:  85 year old male with history of myasthenia gravis/progressive gait disorder not on meds, PD vs Lewy body disease, bipolar disorder, migraine, diastolic CHF, HTN, HLD, cirrhosis, transaminitis and fatty liver disease brought to ED by EMS for fever and altered mental status and admitted for sepsis.  He was started on broad-spectrum antibiotics.  Later in the course, he was diagnosed with retropharyngeal and right neck abscess.    MRI cervical spine on 4/11 revealed abnormal retropharyngeal/prevertebral edema with associated phlegmonous change within the lower right neck concerning for acute infection with probable superimposed 2 cm abscess.  CT soft tissue neck with contrast on 4/12 redemonstrated abnormal prevertebral/retropharyngeal fluid or edema extending from the C1-C2 level inferiorly to the C7 level. Extensive inflammatory stranding and phlegmonous change extending into the right neck soft tissues centered at the C5 level. Findings suspicious for an associated 3.0 x 2.0 abscess along the right posterior aspect of the thyroid cartilage at the C5 level. Heterogeneous enhancement of the right thyroid lobe suspicious for secondary thyroiditis  ENT, ID and IR consulted.  Patient had US guided FNA of right neck mass by IR on 4/13.  Culture with rare GPC.  Blood cultures negative so far.  He was on vancomycin and ceftriaxone, and transitioned to Unasyn on 4/14 .  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  Oncology and ENT recommended repeat biopsy which was done on 4/19.  Pathology pending.  ID recommended discontinuing antibiotic if repeat biopsy confirms malignancy.  Evaluated by PT/OT who recommended CIR.  Waiting on insurance authorization and CIR bed.  Assessment & Plan:   Principal  Problem:   Retropharyngeal abscess Active Problems:   Hypertension   Myasthenia gravis without exacerbation (HCC)   Acute hepatic encephalopathy   Sepsis ([place])   Liver cirrhosis (HCC)   Metastatic carcinoma (HCC)   Thyroid cancer (HCC)  Acute hepatic/metabolic encephalopathy, fatty liver disease, cirrhosis:  - lethargic this morning, difficult to arouse -> ammonia 44, VBG without hypercarbia, no obvious meds to me - continue to monitor for now - this is improved  -Continue lactulose to 30 mg 3 times daily and titrate for 2-3 BM Tydarius Yawn day. -Frequent orientation delirium precautions.  Right neck mass  Metastatic Poorly Differentiated Carcinoma: Cytology from [place] FNA by IR on 4/13 with malignant cells.  - Pathology from 4/19 with metastatic poorly differentiated carcinoma - CT abd/pelvis on admission without malignancy.  CT chest with contrast with slight decrease in size of infitrating soft tissue R neck mass with continued involvement of the R lobe thyroid with retropharngeal and prevertebral soft tissue edema, small bilateral effusions, nonspecific borderline enlarged mediastinal LN's, and cirrhosis. - discussed with oncology -> see note from 4/22 -> recommending ENT determine if locally resectable tumor?  Discussed with on call ENT today, Dr. Blenda Nicely will see tomorrow - Dr. Blenda Nicely from ENT following - attempted to call [facility] ENT, but unable to reach today - RUE [place] with swelling, no evidence of DVT (prelim)  Sepsis, secondary to retropharyngeal infection/right neck abscess-clinically improving. Blood cultures and MRSA PCR negative. MRI cervical spine w/wo contrast and CT soft tissue of neck with contrast as above. Tissue cultures from 4/13 with staph epidermis.  Initial cytology with malignant cells.   -Follow pathology from repeat biopsy -Vancomycin and ceftriaxone>>>Unasyn.  Will stop abx given malignancy on repeat biopsy.   Acute respiratory failure with hypoxia-likely due to  the above.  Resolved.  On room air. -Encourage incentive spirometry and OOB  Chronic diastolic heart failure: Stable -Resume home Lasix. -Monitor intake and output  Myasthenia gravis, progressive gait disorder -Follow-up with neurology outpatient -PT/OT-recommended CIR.  CIR following.  -Plan for CIR when bed available  Essential hypertension: Blood pressure elevated. -Continue lisinopril, atenolol and hydralazine -Increase as needed hydralazine to 10 mg -Resume home Lasix  Depression: Stable -Continue amitriptyline, lexapro   Thrombocytopenia: Likely due to cirrhosis.  Appears to fluctuate widely.  -Continue monitoring  Hypokalemia: replace and follow  4.6 cm ascending thoracic aortic aneurysm: needs semi annual imaging and CT surgery follow up  DVT prophylaxis: Scd Code Status: full Family Communication: wife at bedside Disposition:   Status is: Inpatient  Remains inpatient appropriate because:Inpatient level of care appropriate due to severity of illness   Dispo: The patient is from: Home              Anticipated d/c is to: CIR              Anticipated d/c date is: 1 day              Patient currently is not medically stable to d/c.   Consultants:   Oncology  ENT  ID  IR  Procedures:  FNA biopsy 4/13 [place] core biopsy 4/19  Antimicrobials:  Anti-infectives (From admission, onward)   Start     Dose/Rate Route Frequency Ordered Stop   07/07/19 0000  amoxicillin-clavulanate (AUGMENTIN) 875-125 MG tablet     1 tablet Oral 2 times daily 07/07/19 0940 07/28/19 2359   07/05/19 1300  Ampicillin-Sulbactam (UNASYN) 3 g in sodium chloride 0.9 % 100 mL IVPB  Status:  Discontinued     3 g 200 mL/hr over 30 Minutes Intravenous Every 6 hours 07/05/19 1226 07/12/19 1643   07/04/19 2300  vancomycin (VANCOREADY) IVPB 1750 mg/350 mL  Status:  Discontinued     1,750 mg 175 mL/hr over 120 Minutes Intravenous Every 24 hours 07/04/19 0754 07/05/19 1226   07/03/19 1600   cefTRIAXone (ROCEPHIN) 2 g in sodium chloride 0.9 % 100 mL IVPB  Status:  Discontinued     2 g 200 mL/hr over 30 Minutes Intravenous Every 24 hours 07/03/19 1437 07/05/19 1226   07/02/19 2300  vancomycin (VANCOREADY) IVPB 1500 mg/300 mL  Status:  Discontinued     1,500 mg 150 mL/hr over 120 Minutes Intravenous Every 24 hours 07/01/19 2223 07/04/19 0754   07/02/19 0600  ceFEPIme (MAXIPIME) 2 g in sodium chloride 0.9 % 100 mL IVPB  Status:  Discontinued     2 g 200 mL/hr over 30 Minutes Intravenous Every 8 hours 07/01/19 2223 07/03/19 1437   07/02/19 0600  metroNIDAZOLE (FLAGYL) IVPB 500 mg  Status:  Discontinued     500 mg 100 mL/hr over 60 Minutes Intravenous Every 8 hours 07/01/19 2350 07/05/19 1226   07/01/19 2130  vancomycin (VANCOREADY) IVPB 1750 mg/350 mL     1,750 mg 175 mL/hr over 120 Minutes Intravenous  Once 07/01/19 2115 07/02/19 0235   07/01/19 2115  ceFEPIme (MAXIPIME) 2 g in sodium chloride 0.9 % 100 mL IVPB  Status:  Discontinued     2 g 200 mL/hr over 30 Minutes Intravenous Every 12 hours 07/01/19 2109 07/01/19 2223   07/01/19 2115  metroNIDAZOLE (FLAGYL) IVPB 500 mg     500 mg 100  mL/hr over 60 Minutes Intravenous  Once 07/01/19 2109 07/02/19 0025   07/01/19 2115  vancomycin (VANCOCIN) IVPB 1000 mg/200 mL premix  Status:  Discontinued     1,000 mg 200 mL/hr over 60 Minutes Intravenous  Once 07/01/19 2109 07/01/19 2115      Subjective: No complaints today  Objective: Vitals:   07/15/19 1515 07/15/19 2101 07/16/19 0345 07/16/19 0900  BP: 140/60 135/62 (!) 166/74 (!) 151/62  Pulse:   64 (!) 59  Resp:  20 20 20   Temp:  97.9 F (36.6 C) 98.3 F (36.8 C) 97.8 F (36.6 C)  TempSrc:  Oral Oral Axillary  SpO2:   95% 96%  Weight:   85.6 kg     Intake/Output Summary (Last 24 hours) at 07/16/2019 1359 Last data filed at 07/16/2019 0917 Gross per 24 hour  Intake 10 ml  Output 350 ml  Net -340 ml   Filed Weights   07/14/19 0348 07/15/19 0500 07/16/19 0345    Weight: 92.2 kg 87.9 kg 85.6 kg    Examination:  General: No acute distress. HEENT: R neck mass Cardiovascular: Heart sounds show Rishawn Walck regular rate, and rhythm Lungs: Clear to auscultation bilaterally Abdomen: Soft, nontender, nondistended Neurological: Alert and oriented 3. Moves all extremities 4 . Cranial nerves II through XII grossly intact. Skin: Warm and dry. No rashes or lesions. Extremities: No clubbing or cyanosis. No edema.   Data Reviewed: I have personally reviewed following labs and imaging studies  CBC: Recent Labs  Lab 07/12/19 0023 07/13/19 0416 07/14/19 0239 07/15/19 0236 07/16/19 0336  WBC 5.1 7.4 8.9 7.7 7.0  NEUTROABS  --  3.4 4.5 4.1 3.1  HGB 12.8* 13.1 13.2 13.7 13.5  HCT 36.2* 38.0* 38.8* 39.8 40.7  MCV 97.6 99.2 98.5 98.3 101.5*  PLT 63* 117* 120* 107* 111*    Basic Metabolic Panel: Recent Labs  Lab 07/12/19 0023 07/13/19 0416 07/14/19 0239 07/15/19 0236 07/16/19 0336  NA 137 143 141 142 142  K 3.4* 3.6 3.6 3.4* 3.8  CL 105 106 103 105 107  CO2 23 29 29 28 27   GLUCOSE 94 94 108* 96 88  BUN 14 10 10  7* 11  CREATININE 0.93 0.90 0.79 0.84 0.89  CALCIUM 8.3* 8.5* 8.6* 8.3* 8.3*  MG 1.5* 1.5* 1.6* 1.7 1.8  PHOS 2.6 2.7 2.5 2.5 2.5    GFR: Estimated Creatinine Clearance: 68.2 mL/min (by C-G formula based on SCr of 0.89 mg/dL).  Liver Function Tests: Recent Labs  Lab 07/13/19 0416 07/14/19 0239 07/15/19 0236 07/16/19 0336  AST 72* 73* 68* 59*  ALT 30 30 28 25   ALKPHOS 73 77 69 65  BILITOT 1.6* 1.9* 2.1* 1.9*  PROT 5.9* 5.8* 6.0* 6.0*  ALBUMIN 2.3* 2.3* 2.3* 2.3*    CBG: No results for input(s): GLUCAP in the last 168 hours.   No results found for this or any previous visit (from the past 240 hour(s)).       Radiology Studies: No results found.      Scheduled Meds: . amitriptyline  25 mg Oral QHS  . atenolol  25 mg Oral q AM  . escitalopram  10 mg Oral q AM  . furosemide  20 mg Oral Daily  . hydrALAZINE  50  mg Oral Q8H  . lactulose  30 g Oral TID  . lisinopril  20 mg Oral BID  . sodium chloride flush  10-40 mL Intracatheter Q12H   Continuous Infusions:    LOS: 15 days  Time spent: over 30 min    Fayrene Helper, MD [facility]   To contact the attending provider between 7A-7P or the covering provider during after hours 7P-7A, please log into the web site [email or web address] and access using universal  password for that web site. If you do not have the password, please call the hospital operator.  07/16/2019, 1:59 PM

## 2019-07-17 ENCOUNTER — Inpatient Hospital Stay (HOSPITAL_COMMUNITY): Payer: Medicare Other

## 2019-07-17 ENCOUNTER — Inpatient Hospital Stay (HOSPITAL_COMMUNITY): Payer: Medicare Other | Admitting: Occupational Therapy

## 2019-07-17 ENCOUNTER — Other Ambulatory Visit: Payer: Self-pay

## 2019-07-17 ENCOUNTER — Inpatient Hospital Stay (HOSPITAL_COMMUNITY): Payer: Medicare Other | Admitting: Speech Pathology

## 2019-07-17 ENCOUNTER — Inpatient Hospital Stay (HOSPITAL_COMMUNITY)
Admission: RE | Admit: 2019-07-17 | Discharge: 2019-07-26 | DRG: 843 | Disposition: A | Discharge status: Home Health | Payer: Medicare Other | Source: Intra-hospital | Attending: Physical Medicine & Rehabilitation | Admitting: Physical Medicine & Rehabilitation

## 2019-07-17 DIAGNOSIS — K7460 Unspecified cirrhosis of liver: Secondary | ICD-10-CM | POA: Diagnosis present

## 2019-07-17 DIAGNOSIS — R471 Dysarthria and anarthria: Secondary | ICD-10-CM | POA: Diagnosis present

## 2019-07-17 DIAGNOSIS — K766 Portal hypertension: Secondary | ICD-10-CM | POA: Diagnosis present

## 2019-07-17 DIAGNOSIS — Z6829 Body mass index (BMI) 29.0-29.9, adult: Secondary | ICD-10-CM

## 2019-07-17 DIAGNOSIS — Z7189 Other specified counseling: Secondary | ICD-10-CM | POA: Diagnosis not present

## 2019-07-17 DIAGNOSIS — K219 Gastro-esophageal reflux disease without esophagitis: Secondary | ICD-10-CM | POA: Diagnosis present

## 2019-07-17 DIAGNOSIS — D696 Thrombocytopenia, unspecified: Secondary | ICD-10-CM | POA: Diagnosis present

## 2019-07-17 DIAGNOSIS — Z9841 Cataract extraction status, right eye: Secondary | ICD-10-CM | POA: Diagnosis not present

## 2019-07-17 DIAGNOSIS — C73 Malignant neoplasm of thyroid gland: Secondary | ICD-10-CM | POA: Diagnosis present

## 2019-07-17 DIAGNOSIS — K7290 Hepatic failure, unspecified without coma: Secondary | ICD-10-CM | POA: Diagnosis present

## 2019-07-17 DIAGNOSIS — G9341 Metabolic encephalopathy: Secondary | ICD-10-CM | POA: Diagnosis present

## 2019-07-17 DIAGNOSIS — G3183 Dementia with Lewy bodies: Secondary | ICD-10-CM | POA: Diagnosis present

## 2019-07-17 DIAGNOSIS — C7A1 Malignant poorly differentiated neuroendocrine tumors: Secondary | ICD-10-CM | POA: Diagnosis present

## 2019-07-17 DIAGNOSIS — G4733 Obstructive sleep apnea (adult) (pediatric): Secondary | ICD-10-CM | POA: Diagnosis present

## 2019-07-17 DIAGNOSIS — R269 Unspecified abnormalities of gait and mobility: Secondary | ICD-10-CM | POA: Diagnosis present

## 2019-07-17 DIAGNOSIS — C799 Secondary malignant neoplasm of unspecified site: Secondary | ICD-10-CM

## 2019-07-17 DIAGNOSIS — C3490 Malignant neoplasm of unspecified part of unspecified bronchus or lung: Secondary | ICD-10-CM | POA: Diagnosis present

## 2019-07-17 DIAGNOSIS — K7682 Hepatic encephalopathy: Secondary | ICD-10-CM

## 2019-07-17 DIAGNOSIS — C760 Malignant neoplasm of head, face and neck: Secondary | ICD-10-CM | POA: Diagnosis not present

## 2019-07-17 DIAGNOSIS — F0280 Dementia in other diseases classified elsewhere without behavioral disturbance: Secondary | ICD-10-CM | POA: Diagnosis present

## 2019-07-17 DIAGNOSIS — Z91018 Allergy to other foods: Secondary | ICD-10-CM

## 2019-07-17 DIAGNOSIS — I10 Essential (primary) hypertension: Secondary | ICD-10-CM

## 2019-07-17 DIAGNOSIS — Z515 Encounter for palliative care: Secondary | ICD-10-CM

## 2019-07-17 DIAGNOSIS — Z9842 Cataract extraction status, left eye: Secondary | ICD-10-CM

## 2019-07-17 DIAGNOSIS — R7401 Elevation of levels of liver transaminase levels: Secondary | ICD-10-CM

## 2019-07-17 DIAGNOSIS — I110 Hypertensive heart disease with heart failure: Secondary | ICD-10-CM | POA: Diagnosis present

## 2019-07-17 DIAGNOSIS — Z79899 Other long term (current) drug therapy: Secondary | ICD-10-CM | POA: Diagnosis not present

## 2019-07-17 DIAGNOSIS — E785 Hyperlipidemia, unspecified: Secondary | ICD-10-CM | POA: Diagnosis present

## 2019-07-17 DIAGNOSIS — I5032 Chronic diastolic (congestive) heart failure: Secondary | ICD-10-CM | POA: Diagnosis present

## 2019-07-17 DIAGNOSIS — K5790 Diverticulosis of intestine, part unspecified, without perforation or abscess without bleeding: Secondary | ICD-10-CM | POA: Diagnosis present

## 2019-07-17 DIAGNOSIS — I712 Thoracic aortic aneurysm, without rupture: Secondary | ICD-10-CM | POA: Diagnosis present

## 2019-07-17 DIAGNOSIS — R0989 Other specified symptoms and signs involving the circulatory and respiratory systems: Secondary | ICD-10-CM | POA: Diagnosis present

## 2019-07-17 DIAGNOSIS — J9811 Atelectasis: Secondary | ICD-10-CM | POA: Diagnosis present

## 2019-07-17 DIAGNOSIS — Z885 Allergy status to narcotic agent status: Secondary | ICD-10-CM

## 2019-07-17 DIAGNOSIS — G7000 Myasthenia gravis without (acute) exacerbation: Secondary | ICD-10-CM | POA: Diagnosis present

## 2019-07-17 DIAGNOSIS — E669 Obesity, unspecified: Secondary | ICD-10-CM | POA: Diagnosis present

## 2019-07-17 DIAGNOSIS — Z888 Allergy status to other drugs, medicaments and biological substances status: Secondary | ICD-10-CM

## 2019-07-17 DIAGNOSIS — F319 Bipolar disorder, unspecified: Secondary | ICD-10-CM | POA: Diagnosis present

## 2019-07-17 LAB — CBC WITH DIFFERENTIAL/PLATELET
Abs Immature Granulocytes: 0.02 10*3/uL (ref 0.00–0.07)
Basophils Absolute: 0.1 10*3/uL (ref 0.0–0.1)
Basophils Relative: 1 %
Eosinophils Absolute: 0.4 10*3/uL (ref 0.0–0.5)
Eosinophils Relative: 5 %
HCT: 38.4 % — ABNORMAL LOW (ref 39.0–52.0)
Hemoglobin: 12.7 g/dL — ABNORMAL LOW (ref 13.0–17.0)
Immature Granulocytes: 0 %
Lymphocytes Relative: 31 %
Lymphs Abs: 2.6 10*3/uL (ref 0.7–4.0)
MCH: 33.2 pg (ref 26.0–34.0)
MCHC: 33.1 g/dL (ref 30.0–36.0)
MCV: 100.3 fL — ABNORMAL HIGH (ref 80.0–100.0)
Monocytes Absolute: 0.8 10*3/uL (ref 0.1–1.0)
Monocytes Relative: 9 %
Neutro Abs: 4.6 10*3/uL (ref 1.7–7.7)
Neutrophils Relative %: 54 %
Platelets: 107 10*3/uL — ABNORMAL LOW (ref 150–400)
RBC: 3.83 MIL/uL — ABNORMAL LOW (ref 4.22–5.81)
RDW: 16.2 % — ABNORMAL HIGH (ref 11.5–15.5)
WBC: 8.5 10*3/uL (ref 4.0–10.5)
nRBC: 0 % (ref 0.0–0.2)

## 2019-07-17 LAB — COMPREHENSIVE METABOLIC PANEL
ALT: 15 U/L (ref 0–44)
AST: 78 U/L — ABNORMAL HIGH (ref 15–41)
Albumin: 2.2 g/dL — ABNORMAL LOW (ref 3.5–5.0)
Alkaline Phosphatase: 70 U/L (ref 38–126)
Anion gap: 8 (ref 5–15)
BUN: 15 mg/dL (ref 8–23)
CO2: 24 mmol/L (ref 22–32)
Calcium: 8.2 mg/dL — ABNORMAL LOW (ref 8.9–10.3)
Chloride: 109 mmol/L (ref 98–111)
Creatinine, Ser: 0.93 mg/dL (ref 0.61–1.24)
GFR calc Af Amer: >60 mL/min (ref >60)
GFR calc non Af Amer: >60 mL/min (ref >60)
Glucose, Bld: 88 mg/dL (ref 70–99)
Potassium: 4.5 mmol/L (ref 3.5–5.1)
Sodium: 141 mmol/L (ref 135–145)
Total Bilirubin: 2.2 mg/dL — ABNORMAL HIGH (ref 0.3–1.2)
Total Protein: 5.6 g/dL — ABNORMAL LOW (ref 6.5–8.1)

## 2019-07-17 LAB — MAGNESIUM: Magnesium: 1.8 mg/dL (ref 1.7–2.4)

## 2019-07-17 LAB — PHOSPHORUS: Phosphorus: 2.1 mg/dL — ABNORMAL LOW (ref 2.5–4.6)

## 2019-07-17 MED ORDER — HYDRALAZINE HCL 50 MG PO TABS
50.0000 mg | ORAL_TABLET | Freq: Three times a day (TID) | ORAL | Status: DC
  Administered 2019-07-17 – 2019-07-26 (×26): 50 mg via ORAL
  Filled 2019-07-17 (×26): qty 1

## 2019-07-17 MED ORDER — LISINOPRIL 20 MG PO TABS
20.0000 mg | ORAL_TABLET | Freq: Two times a day (BID) | ORAL | Status: DC
  Administered 2019-07-17 – 2019-07-26 (×18): 20 mg via ORAL
  Filled 2019-07-17 (×18): qty 1

## 2019-07-17 MED ORDER — MAGNESIUM OXIDE 400 (241.3 MG) MG PO TABS
400.0000 mg | ORAL_TABLET | Freq: Two times a day (BID) | ORAL | Status: AC
  Administered 2019-07-17: 400 mg via ORAL
  Filled 2019-07-17: qty 1

## 2019-07-17 MED ORDER — AMITRIPTYLINE HCL 50 MG PO TABS
25.0000 mg | ORAL_TABLET | Freq: Every day | ORAL | Status: DC
  Administered 2019-07-17 – 2019-07-25 (×9): 25 mg via ORAL
  Filled 2019-07-17 (×9): qty 1

## 2019-07-17 MED ORDER — HYDRALAZINE HCL 50 MG PO TABS: 50.0000 mg | ORAL_TABLET | Freq: Three times a day (TID) | ORAL | 0 refills | Status: DC

## 2019-07-17 MED ORDER — FUROSEMIDE 20 MG PO TABS
20.0000 mg | ORAL_TABLET | Freq: Every day | ORAL | Status: DC
  Administered 2019-07-18 – 2019-07-21 (×4): 20 mg via ORAL
  Filled 2019-07-17 (×4): qty 1

## 2019-07-17 MED ORDER — ESCITALOPRAM OXALATE 10 MG PO TABS
10.0000 mg | ORAL_TABLET | Freq: Every morning | ORAL | Status: DC
  Administered 2019-07-18 – 2019-07-26 (×9): 10 mg via ORAL
  Filled 2019-07-17 (×9): qty 1

## 2019-07-17 MED ORDER — LACTULOSE 10 GM/15ML PO SOLN
30.0000 g | Freq: Three times a day (TID) | ORAL | Status: DC
  Administered 2019-07-17 – 2019-07-26 (×25): 30 g via ORAL
  Filled 2019-07-17 (×13): qty 45
  Filled 2019-07-17: qty 30
  Filled 2019-07-17 (×14): qty 45

## 2019-07-17 MED ORDER — SORBITOL 70 % SOLN: 30.0000 mL | Freq: Every day | Status: DC | PRN

## 2019-07-17 MED ORDER — ATENOLOL 25 MG PO TABS
25.0000 mg | ORAL_TABLET | Freq: Every morning | ORAL | Status: DC
  Administered 2019-07-18 – 2019-07-26 (×9): 25 mg via ORAL
  Filled 2019-07-17 (×9): qty 1

## 2019-07-17 MED ORDER — LACTULOSE 10 GM/15ML PO SOLN: 30.0000 g | Freq: Three times a day (TID) | ORAL | 2 refills | Status: DC

## 2019-07-17 NOTE — Progress Notes (Signed)
Occupational Therapy Treatment Patient Details Name: Glen Finley MRN: [id number] DOB: 09-16-1934 Today's Date: 07/17/2019    History of present illness Glen Finley is an 85 y.o. 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  He was recently admitted to the hospital and discharged 06/30/19 after being treated for acute hepatic encephalopathy.   OT comments  All goals reviewed and updated. Pt continues to make progress, demonstrating improved activity tolerance. Continued education/instruction with pt on safety strategies, activity pacing, and body positioning with RW to increase balance and prevent future falls with poor to fair understanding and follow through. Pt able to ambulate to/from bathroom with RW and min guard, noting 0 instances of LOB. Pt completed toileting task requiring cues for safety and body positioning with pt requiring assist to power on/off toilet. Pt tolerated standing 1 x 6 min at the sink to complete grooming/hygiene tasks with min guard. Pt required frequent multimodal cues to initiate and sequence all tasks. Pt required increased time to process and complete one step instructions. OT will continue to follow acutely. Continue to recommend CIR placement for additional rehab prior to discharge.    Follow Up Recommendations  CIR;Supervision/Assistance - 24 hour    Equipment Recommendations  3 in 1 bedside commode    Recommendations for Other Services      Precautions / Restrictions Precautions Precautions: Fall Restrictions Weight Bearing Restrictions: No       Mobility Bed Mobility Overal bed mobility: Needs Assistance Bed Mobility: Supine to Sit     Supine to sit: HOB elevated;Supervision     General bed mobility comments: supervision for safety. Increased time and effort with use of bed rails and HOB elevated.  Transfers Overall transfer level: Needs assistance Equipment used: Rolling walker (2 wheeled);None Transfers: Sit to/from Stand Sit to Stand: Min assist         General transfer comment: Occasional assist to power up into standing. Pt required several attempts to stand from bed. Cues for hand placement and safety.     Balance Overall balance assessment: Needs assistance Sitting-balance support: Feet supported Sitting balance-Leahy Scale: Fair       Standing balance-Leahy Scale: Fair                             ADL either performed or assessed with clinical judgement   ADL Overall ADL's : Needs assistance/impaired     Grooming: Wash/dry hands;Wash/dry face;Oral care;Min guard Grooming Details (indicate cue type and reason): While standing at the sink             Lower Body Dressing: Set up;Supervision/safety;Sitting/lateral leans Lower Body Dressing Details (indicate cue type and reason): While seated in bedside chair. Figure four position to don/doff socks.  Toilet Transfer: Minimal assistance;Ambulation;Regular Toilet;Grab bars;RW Armed forces technical officer Details (indicate cue type and reason): Pt able to ambulate to/from bathroom with RW and min guard. Assist to safely sit on toilet and power up into standing once finished. Cues for hand placement.  Toileting- Clothing Manipulation and Hygiene: Minimal assistance;Sit to/from stand       Functional mobility during ADLs: Min guard;Rolling walker General ADL Comments: Pt able to ambulate to/from bathroom with RW and min guard. 0 instances of LOB however pt unsteady on feet  requiring frequent cues for safety and body positioning with RW. Pt tolerated standing 1 x 6 min at the sink to complete grooming/hygiene tasks.      Vision       Perception     Praxis      Cognition  Arousal/Alertness: Awake/alert Behavior During Therapy: WFL for tasks assessed/performed Overall Cognitive Status: History of cognitive impairments - at baseline Area of Impairment: Orientation;Attention;Memory;Following commands;Safety/judgement;Awareness;Problem solving                 Orientation Level: Disoriented to;Time Current Attention Level: Sustained Memory: Decreased short-term memory Following Commands: Follows one step commands inconsistently;Follows one step commands with increased time Safety/Judgement: Decreased awareness of safety;Decreased awareness of deficits Awareness: Emergent Problem Solving: Slow processing;Decreased initiation;Difficulty sequencing;Requires verbal cues;Requires tactile cues General Comments: Increased difficulty following one step instructions, initiating, and sequencing tasks this date. Pt required mod to max multimodal cues to complete all tasks. Frequent cues for safety when ambulating with RW.         Exercises     Shoulder Instructions       General Comments No signs/symptoms of distress. SpO2 maintained in 90s on room air.     Pertinent Vitals/ Pain       Pain Assessment: No/denies pain  Home Living                                          Prior Functioning/Environment              Frequency           Progress Toward Goals  OT Goals(current goals can now be found in the care plan section)  Progress towards OT goals: Progressing toward goals     Plan Discharge plan remains appropriate    Co-evaluation                 AM-PAC OT "6 Clicks" Daily Activity     Outcome Measure   Help from another person eating meals?: None Help from another person taking care of personal grooming?: A Little Help from another person toileting, which includes using toliet, bedpan, or urinal?: A Little Help from another person bathing (including washing, rinsing, drying)?: A Little Help from another  person to put on and taking off regular upper body clothing?: A Little Help from another person to put on and taking off regular lower body clothing?: A Little 6 Click Score: 19    End of Session Equipment Utilized During Treatment: Gait belt;Rolling walker  OT Visit Diagnosis: Unsteadiness on feet (R26.81);Muscle weakness (generalized) (M62.81)   Activity Tolerance Patient tolerated treatment well   Patient Left in chair;with call bell/phone within reach;with chair alarm set   Nurse Communication Mobility status        Time: 3614-4315 OT Time Calculation (min): 29 min  Charges: OT General Charges $OT Visit: 1 Visit OT Treatments $Self Care/Home Management : 8-22 mins $Therapeutic Activity: 8-22 mins  Mauri Brooklyn OTR/L [phone]   Mauri Brooklyn 07/17/2019, 8:48 AM

## 2019-07-17 NOTE — Progress Notes (Addendum)
Glen Staggers, MD  Physician  Physical Medicine and Rehabilitation  PMR Pre-admission     Signed  Date of Service:  07/10/2019  4:26 PM      Related encounter: ED to Hosp-Admission (Current) from 07/01/2019 in [facility] 4E CV SURGICAL PROGRESSIVE CARE      Signed        Show:Clear all _0 Manual_1 Template_2 Copied  Added by: _3 Glen Finley, Glen Finley, CCC-SLP_4 Glen Staggers, MD_5 Glen Finley, PT  _6 Hover for details PMR Admission Coordinator Pre-Admission Assessment   Patient: Glen Finley is an 85 y.o., male MRN: [id number] DOB: February 27, 1935 Height:   Weight: 87.3 kg   Insurance Information HMO:     PPO: Yes    PCP:      IPA:      80/20:      OTHER:  PRIMARY: [employer] MCR      Policy#: [id number]      Subscriber: patient CM Name:       Phone#: [phone]     Fax#: [phone] Pre-Cert#: [id number] Auth provided by Glen Finley with [place].  Updates due on 07/24/19 to fax listed above.      Employer:  Benefits:  Phone #: verified online via [email or web address]     Name:  Eff. Date: 03/24/2019-still active     Deduct: NA      Out of Pocket Max: $3,900 ($43.51 met)      Life Max:  CIR: $345/day co-pay for days 1-5, $0/day co-pay for days 6+      SNF: $0/day co-pay for days 1-20, $184/day co-pay for days 21-42, $0/day co-pay for days 43-100; limited to 100 days/cal year Outpatient: $35/visit co-pay; limited by medical necessity     Co-Pay:  Home Health: 100% coverage, 0% co-insurance; limited by medical necessity      Co-Pay:  DME: 80% coverage; 20% co-insurance     Co-Pay:  Providers:  SECONDARY:       Policy#:      Phone#:    Development worker, community:       Phone#:    The Therapist, art Information Summary" for patients in Inpatient Rehabilitation Facilities with attached "Privacy Act [place] Records" was provided and verbally reviewed with: Patient and Family   Emergency Contact Information         Contact Information     Name Relation Home Work Mobile   [place] Spouse [phone]   [phone]    [facility] Daughter [phone]        Glen Finley [phone]             Current Medical History  Patient Admitting Diagnosis: Glen Finley is an 85 year old right-handed male with history of hyperlipidemia, 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 Krista Finley, obesity with BMI 29.43.  Patient recently discharged 06/30/2019 after being treated for acute hepatic encephalopathy in the setting of lactulose noncompliance.  Per chart review patient lives with spouse.  1 level home with ramped entrance.  Reportedly independent but sedentary prior to admission.  Presented 07/01/2019 with fever and altered mental status.  Admission chemistries BUN 23, creatinine 1.12, total bilirubin 3.2, blood cultures no growth to date, lactic acid 2.4, hemoglobin 14.8, platelets 94,000, urinalysis negative nitrite, ammonia 92, SARS coronavirus negative.  CT of abdomen pelvis showed liver cirrhosis with evidence for portal hypertension.  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  Left  colon diverticular disease without acute inflammatory process.  He was placed on broad-spectrum antibiotics.  CT maxillofacial as well as MRI cervical spine soft tissue of the neck CT showed abnormal prevertebral retropharyngeal fluid or edema extending from C1-C2 level inferiorly to the C7 level.  Extensive inflammatory stranding and phlegmonous change extending into the right neck soft tissue centered at the C5 level.  Findings suspicious for associated 3.0 x 2.0 abscess along the right posterior aspect of the thyroid cartilage at the C5 level.  Patient had ultrasound-guided FNA of right neck mass by interventional radiology 07/04/2019 culture rare GPC.  Blood cultures  negative so far.  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  Oncology and ENT recommended repeat biopsy which was done 07/10/2019 pathology showed positive straining for TTF-1-1 raising the possibility of metastatic poorly differentiated lung non-small cell carcinoma..  Medical oncology has been consulted current plan supportive care.  All antibiotics have since been discontinued.  In regards to patient's acute hepatic metabolic encephalopathy he continues on lactulose 30 mg 3 times daily his ammonia levels have improved to 30.  Patient is tolerating a regular diet.  Therapy evaluations completed and patient was recommended for a comprehensive rehab program.   Patient[facility] record from [facility] has been reviewed by the rehabilitation admission coordinator and physician.   Past Medical History      Past Medical History:  Diagnosis Date  . Abnormality of gait    . GERD (gastroesophageal reflux disease)    . Headache(784.0)    . Hyperlipidemia    . Hypertension    . Migraine    . Myasthenia gravis without exacerbation ([place])    . Obesity    . Other extrapyramidal disease and abnormal movement disorder    . REM sleep behavior disorder    . Unspecified psychosis    . Weakness        Family History   family history is not on file.   Prior Rehab/Hospitalizations Has the patient had prior rehab or hospitalizations prior to admission? Yes   Has the patient had major surgery during 100 days prior to admission? No               Current Medications   Current Facility-Administered Medications:  .  amitriptyline (ELAVIL) tablet 25 mg, 25 mg, Oral, QHS, Dessa Phi, DO, 25 mg at 07/16/19 2204 .  atenolol (TENORMIN) tablet 25 mg, 25 mg, Oral, q AM, Dessa Phi, DO, 25 mg at 07/17/19 0617 .  escitalopram (LEXAPRO) tablet 10 mg, 10 mg, Oral, q AM, Dessa Phi, DO, 10 mg at 07/17/19 0617 .  furosemide (LASIX) tablet 20 mg, 20 mg, Oral, Daily, Gonfa, Taye T, MD, 20 mg at  07/17/19 0901 .  hydrALAZINE (APRESOLINE) injection 10 mg, 10 mg, Intravenous, Q4H PRN, Wendee Beavers T, MD, 10 mg at 07/12/19 0412 .  hydrALAZINE (APRESOLINE) tablet 50 mg, 50 mg, Oral, Q8H, Gonfa, Taye T, MD, 50 mg at 07/17/19 0617 .  lactulose (CHRONULAC) 10 GM/15ML solution 30 g, 30 g, Oral, TID, Gonfa, Taye T, MD, 30 g at 07/17/19 0900 .  lisinopril (ZESTRIL) tablet 20 mg, 20 mg, Oral, BID, Dessa Phi, DO, 20 mg at 07/17/19 0900 .  sodium chloride flush (NS) 0.9 % injection 10-40 mL, 10-40 mL, Intracatheter, Q12H, Gonfa, Taye T, MD, 10 mL at 07/17/19 0901 .  sodium chloride flush (NS) 0.9 % injection 10-40 mL, 10-40 mL, Intracatheter, PRN, Mercy Riding, MD   Patients Current Diet:  Diet Order                      Diet regular Room service appropriate? Yes with Assist; Fluid consistency: Thin  Diet effective now                   Precautions / Restrictions Precautions Precautions: Fall Restrictions Weight Bearing Restrictions: No    Has the patient had 2 or more falls or a fall with injury in the past year? Yes   Prior Activity Level Limited Community (1-2x/wk): wife/pt go to Bible study, errands   Prior Functional Level Self Care: Did the patient need help bathing, dressing, using the toilet or eating? Independent   Indoor Mobility: Did the patient need assistance with walking from room to room (with or without device)? Independent   Stairs: Did the patient need assistance with internal or external stairs (with or without device)? Needed some help   Functional Cognition: Did the patient need help planning regular tasks such as shopping or remembering to take medications? Needed some help   Home Assistive Devices / Equipment Home Equipment: Walker - 2 wheels   Prior Device Use: Indicate devices/aids used by the patient prior to current illness, exacerbation or injury? cane   Current Functional Level Cognition   Overall Cognitive Status: History of cognitive  impairments - at baseline Current Attention Level: Sustained Orientation Level: Oriented X4 Following Commands: Follows one step commands inconsistently, Follows one step commands with increased time Safety/Judgement: Decreased awareness of safety, Decreased awareness of deficits General Comments: Increased difficulty following one step instructions, initiating, and sequencing tasks this date. Pt required mod to max multimodal cues to complete all tasks. Frequent cues for safety when ambulating with RW.     Extremity Assessment (includes Sensation/Coordination)   Upper Extremity Assessment: Generalized weakness  Lower Extremity Assessment: Generalized weakness     ADLs   Overall ADL's : Needs assistance/impaired Eating/Feeding: Set up, Sitting Grooming: Wash/dry hands, Wash/dry face, Oral care, Min guard Grooming Details (indicate cue type and reason): While standing at the sink Upper Body Bathing: Set up, Standing Lower Body Bathing: Min guard, Cueing for safety, Cueing for sequencing, Sit to/from stand, Sitting/lateral leans Upper Body Dressing : Set up, Sitting Lower Body Dressing: Set up, Supervision/safety, Sitting/lateral leans Lower Body Dressing Details (indicate cue type and reason): While seated in bedside chair. Figure four position to don/doff socks.  Toilet Transfer: Minimal assistance, Ambulation, Regular Toilet, Grab bars, RW Toilet Transfer Details (indicate cue type and reason): Pt able to ambulate to/from bathroom with RW and min guard. Assist to safely sit on toilet and power up into standing once finished. Cues for hand placement.  Toileting- Clothing Manipulation and Hygiene: Minimal assistance, Sit to/from stand Toileting - Clothing Manipulation Details (indicate cue type and reason): Assist for clothing management and balance Functional mobility during ADLs: Min guard, Rolling walker General ADL Comments: Pt able to ambulate to/from bathroom with RW and min guard. 0  instances of LOB however pt unsteady on feet requiring frequent cues for safety and body positioning with RW. Pt tolerated standing 1 x 6 min at the sink to complete grooming/hygiene tasks.      Mobility   Overal bed mobility: Needs Assistance Bed Mobility: Supine to Sit Supine to sit: HOB elevated, Supervision Sit to supine: Min assist General bed mobility comments: supervision for safety. Increased time and effort with use of bed rails and HOB elevated.     Transfers  Overall transfer level: Needs assistance Equipment used: Rolling walker (2 wheeled), None Transfers: Sit to/from Stand Sit to Stand: Min assist Stand pivot transfers: Min assist General transfer comment: Occasional assist to power up into standing. Pt required several attempts to stand from bed. Cues for hand placement and safety.      Ambulation / Gait / Stairs / Wheelchair Mobility   Ambulation/Gait Ambulation/Gait assistance: Min guard, Herbalist (Feet): 250 Feet(1 seated rest brake) Assistive device: Rolling walker (2 wheeled) Gait Pattern/deviations: Step-through pattern, Decreased stride length, Drifts right/left General Gait Details: Cues throughout for RW safety. After negotiating stairs, pt be same noticably fatigued and unbalance, requiring increased assistance and a seated rest brake to recover. LOB x2 requiring min A to steady. SpO2 92%, HR 64 BPM Gait velocity: decr Gait velocity interpretation: <1.31 ft/sec, indicative of household ambulator Stairs: Yes Stairs assistance: Min assist, Mod assist Stair Management: Two rails, Forwards Number of Stairs: 3 General stair comments: Pt was able to ascend steps with min A for safety and balance. To descend the last step pt required mod A for balance and safety. He became more unsteady as he fatigued.     Posture / Balance Dynamic Sitting Balance Sitting balance - Comments: UE support  Balance Overall balance assessment: Needs  assistance Sitting-balance support: Feet supported Sitting balance-Leahy Scale: Fair Sitting balance - Comments: UE support  Postural control: Right lateral lean Standing balance support: Bilateral upper extremity supported, During functional activity, No upper extremity supported Standing balance-Leahy Scale: Fair Standing balance comment: min A without device, S to minguard with walker for balance during hygiene after on BSC     Special needs/care consideration Skin ecchymosis-arm, hand (right, left), urine incontinence, Ampicillin-Sulbactam 276m/hr and Designated visitor [employer] wife. AK[employer]iva grandson    Previous HEnvironmental health practitioner(from acute therapy documentation) Living Arrangements: Spouse/significant other  Lives With: Spouse Available Help at Discharge: Family, Available 24 hours/day Type of Home: House Home Layout: One level Home Access: Stairs to enter, Ramped entrance Entrance Stairs-Rails: Right Entrance Stairs-Number of Steps: 3 Bathroom Shower/Tub: TChiropodist Standard Bathroom Accessibility: Yes How Accessible: Accessible via walker [place] No   Discharge Living Setting Plans for Discharge Living Setting: Patient's home Type of Home at Discharge: House Discharge Home Layout: One level Discharge Home Access: Stairs to enter, [place]inetownentrance Discharge Bathroom Shower/Tub: TIsle of Wightunit Discharge Bathroom Toilet: Standard Discharge Bathroom Accessibility: Yes How Accessible: Accessible via walker Does the patient have any problems obtaining your medications?: No   Social/Family/Support Systems Patient Roles: Spouse Anticipated Caregiver: SDal Blew wife. AKarlton LemonAnticipated Caregiver's Contact Information: [phone]-GEXBMWUXCaregiver Availability: 24/7 Discharge Plan Discussed with Primary Caregiver: Yes Is Caregiver In Agreement with Plan?: Yes Does Caregiver/Family have  Issues with Lodging/Transportation while Pt is in Rehab?: No   Goals Patient/Family Goal for Rehab: PT/OT/SLP supervision to mod I Expected length of stay: 5-7 days Pt/Family Agrees to Admission and willing to participate: Yes Program Orientation Provided & Reviewed with Pt/Caregiver Including Roles  & Responsibilities: Yes   Decrease burden of Care through IP rehab admission: NA   Possible need for SNF placement upon discharge: NA d/t reliable d/c plan and anticipation of pt reaching goals   Patient Condition: [facility] spoken with CM, and patient and spouse. I met with patient at the bedside for inpatient rehabilitation assessment.  Patient will benefit from ongoing PT, OT and SLP, can actively participate in 3  hours of therapy a day 5 days of the week, and can make measurable gains during the admission.  Patient will also benefit from the coordinated team approach during an Inpatient Acute Rehabilitation admission.  The patient will receive intensive therapy as well as Rehabilitation physician, nursing, social worker, and care management interventions.  Due to bladder management, bowel management, safety, skin/wound care, disease management, medication administration, pain management and patient education the patient requires 24 hour a day rehabilitation nursing.  The patient is currently min guard with mobility, mod assist for stair negotiation, and min assist basic ADLs.  Discharge setting and therapy post discharge at home with home health is anticipated.  Patient has agreed to participate in the Acute Inpatient Rehabilitation Program and will admit Sunday.   Preadmission Screen Completed By:  Bethel Born, 07/10/2019 4:26 PM, with brief updates by Shann Medal, PT, DPT 07/17/19 12:55 PM  ______________________________________________________________________   Discussed status with Dr. Letta Pate on 07/17/19  at 12:55 PM  and received  approval for admission today.   Admission Coordinator:  Bethel Born, CCC-SLP, Shann Medal PT, DPT, time 12:55 PM / Date 07/17/19     Assessment/Plan: Diagnosis: pharyngeal mass, ?metastatic lung ca. Hx of PD/MG.  1. Does the need for close, 24 hr/day Medical supervision in concert with the patient's rehab needs make it unreasonable for this patient to be served in a less intensive setting? Yes 2. Co-Morbidities requiring supervision/potential complications: hepatic encephalopathy, bipolar d/o, dchf, htn 3. Due to bladder management, bowel management, safety, skin/wound care, disease management, medication administration, pain management and patient education, does the patient require 24 hr/day rehab nursing? Yes 4. Does the patient require coordinated care of a physician, rehab nurse, PT, OT, and SLP to address physical and functional deficits in the context of the above medical diagnosis(es)? Yes Addressing deficits in the following areas: balance, endurance, locomotion, strength, transferring, bowel/bladder control, bathing, dressing, feeding, grooming, toileting, cognition, speech, swallowing and psychosocial support 5. Can the patient actively participate in an intensive therapy program of at least 3 hrs of therapy 5 days a week? Yes 6. The potential for patient to make measurable gains while on inpatient rehab is excellent 7. Anticipated functional outcomes upon discharge from inpatient rehab: modified independent and supervision PT, modified independent and supervision OT, modified independent and supervision SLP 8. Estimated rehab length of stay to reach the above functional goals is: 5-8 days 9. Anticipated discharge destination: Home 10. Overall Rehab/Functional Prognosis: excellent     MD Signature: Glen Staggers, MD, [place] [facility] 07/17/2019

## 2019-07-17 NOTE — H&P (Signed)
Physical Medicine and Rehabilitation Admission H&P        Chief Complaint  Patient presents with  . Fever  . 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 Krista Blue, obesity with BMI 29.43.  Patient recently discharged 06/30/2019 after being treated for acute hepatic encephalopathy in the setting of lactulose noncompliance.  Per chart review patient lives with spouse.  1 level home with ramped entrance.  Reportedly independent but sedentary prior to admission.  Presented 07/01/2019 with fever and altered mental status.  Admission chemistries BUN 23, creatinine 1.12, total bilirubin 3.2, blood cultures no growth to date, lactic acid 2.4, hemoglobin 14.8, platelets 94,000, urinalysis negative nitrite, ammonia 92, SARS coronavirus negative.  CT of abdomen pelvis showed liver cirrhosis with evidence for portal hypertension.  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  Left colon diverticular disease without acute inflammatory process.  He was placed on broad-spectrum antibiotics.  CT maxillofacial as well as MRI cervical spine soft tissue of the neck CT showed abnormal prevertebral retropharyngeal fluid or edema extending from C1-C2 level inferiorly to the C7 level.  Extensive inflammatory stranding and phlegmonous change extending into the right neck soft tissue centered at the C5 level.  Findings suspicious for associated 3.0 x 2.0 abscess along the right posterior aspect of the thyroid cartilage at the C5 level.  Patient had ultrasound-guided FNA of right neck mass by interventional radiology 07/04/2019 culture rare GPC.  Blood cultures negative so far.  Cytology from biopsy  revealed malignant cells.  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  Medical oncology has been consulted current plan supportive care, potential XRT.  All antibiotics have since been discontinued.  CT of the chest 07/12/2019 showed slight decrease in size of infiltrating soft tissue right neck mass.  Small bilateral pleural effusions with dependent lower lobe atelectasis.  4.6 cm ascending thoracic aortic aneurysm with recommendations of semiannual imaging followed by CTA or MRA.  In regards to patient's acute hepatic metabolic encephalopathy he continues on lactulose 30 mg 3 times daily his ammonia levels have improved to 44.  Patient is tolerating a regular diet.  Therapy evaluations completed and patient was admitted for a comprehensive rehab program.   Review of Systems  Constitutional: Negative for fever.  HENT: Negative for hearing loss.   Eyes: Negative for blurred vision and double vision.  Respiratory: Positive for shortness of breath. Negative for cough.   Cardiovascular: Positive for leg swelling. Negative for chest pain and palpitations.  Gastrointestinal: Positive for constipation and nausea. Negative for vomiting.       GERD  Genitourinary: Negative for dysuria, flank pain and hematuria.  Musculoskeletal: Positive for joint pain and myalgias.  Skin: Negative for rash.  Neurological: Positive for dizziness, weakness and headaches.       Gait disorder  Psychiatric/Behavioral: Positive for depression. The patient has insomnia.        Unspecified psychoses  All other systems reviewed and are negative.       Past Medical History:  Diagnosis Date  . Abnormality of gait    . GERD (gastroesophageal reflux disease)    .  Headache(784.0)    . Hyperlipidemia    . Hypertension    . Migraine    . Myasthenia  gravis without exacerbation ([place])    . Obesity    . Other extrapyramidal disease and abnormal movement disorder    . REM sleep behavior disorder    . Unspecified psychosis    . Weakness           Past Surgical History:  Procedure Laterality Date  . CATARACT EXTRACTION Bilateral      History reviewed. No pertinent family history. Social History:  reports that he has never smoked. He has never used smokeless tobacco. He reports that he does not drink alcohol or use drugs. Allergies:       Allergies  Allergen Reactions  . Amlodipine Swelling and Other (See Comments)      CAUSED SEVERE SWELLING OF THE LEGS THAT REQUIRED A TRIP TO THE MD'S OFFICE  . Cherry Other (See Comments)      Migraines    . Onion Other (See Comments)      Can eat cooked onions ONLY (or, patient develops migraines)  . Codeine Rash          Medications Prior to Admission  Medication Sig Dispense Refill  . alendronate (FOSAMAX) 70 MG tablet Take 70 mg by mouth every Sunday.    3  . amitriptyline (ELAVIL) 25 MG tablet Take 25 mg by mouth at bedtime.      Marland Kitchen aspirin EC 81 MG tablet Take 81 mg by mouth See admin instructions. Take 81 mg by mouth at bedtime on Mon/Wed/Fri (only)      . atenolol (TENORMIN) 25 MG tablet Take 25 mg by mouth in the morning.       . Cholecalciferol (VITAMIN D-3) 25 MCG (1000 UT) CAPS Take 1,000 Units by mouth daily with breakfast.      . escitalopram (LEXAPRO) 10 MG tablet Take 10 mg by mouth in the morning.    5  . fish oil-omega-3 fatty acids 1000 MG capsule Take 1 g by mouth 2 (two) times daily.      . furosemide (LASIX) 20 MG tablet Take 20 mg by mouth daily as needed for fluid or edema.       Marland Kitchen lactulose (CHRONULAC) 10 GM/15ML solution Take 15 mLs (10 g total) by mouth 2 (two) times daily. 236 mL 2  . Liniments (SALONPAS ARTHRITIS PAIN RELIEF EX) Apply 1 application topically daily as needed (for right knee pain).      Marland Kitchen lisinopril (ZESTRIL) 20 MG tablet Take 20 mg by mouth 2 (two)  times daily.      . Multiple Vitamins-Minerals (ONE-A-DAY MENS 50+) TABS Take 1 tablet by mouth daily with breakfast.      . Polyethyl Glyc-Propyl Glyc PF (SYSTANE ULTRA PF) 0.4-0.3 % SOLN Place 1 drop into both eyes 2 (two) times daily as needed (for eye redness or burning).          Drug Regimen Review Drug regimen was reviewed and remains appropriate with no significant issues identified   Home: Home Living Family/patient expects to be discharged to:: Private residence Living Arrangements: Spouse/significant other Available Help at Discharge: Family, Available 24 hours/day Type of Home: House Home Access: Stairs to enter, Ramped entrance Technical brewer of Steps: 3 Entrance Stairs-Rails: Right Home Layout: One level Bathroom Shower/Tub: Chiropodist: Standard Bathroom Accessibility: Yes Home Equipment: Environmental consultant - 2 wheels  Lives With: Spouse   Functional History: Prior Function Level  of Independence: Independent Comments: decreased endurance at home   Functional Status:  Mobility: Bed Mobility Overal bed mobility: Needs Assistance Bed Mobility: Supine to Sit Supine to sit: HOB elevated, Supervision Sit to supine: Min assist General bed mobility comments: supervision for safety. Increased time and effort with use of bed rails and HOB elevated. Transfers Overall transfer level: Needs assistance Equipment used: Rolling walker (2 wheeled), None Transfers: Sit to/from Stand Sit to Stand: Min assist Stand pivot transfers: Min assist General transfer comment: Pt flopped back on first attempt to rise requiring min A to power up and rise from EOB. Cues for safe hand placement with RW. Ambulation/Gait Ambulation/Gait assistance: Min guard, Min assist Gait Distance (Feet): 250 Feet(1 seated rest brake) Assistive device: Rolling walker (2 wheeled) Gait Pattern/deviations: Step-through pattern, Decreased stride length, Drifts right/left General Gait  Details: Cues throughout for RW safety. After negotiating stairs, pt be same noticably fatigued and unbalance, requiring increased assistance and a seated rest brake to recover. LOB x2 requiring min A to steady. SpO2 92%, HR 64 BPM Gait velocity: decr Gait velocity interpretation: <1.31 ft/sec, indicative of household ambulator Stairs: Yes Stairs assistance: Min assist, Mod assist Stair Management: Two rails, Forwards Number of Stairs: 3 General stair comments: Pt was able to ascend steps with min A for safety and balance. To descend the last step pt required mod A for balance and safety. He became more unsteady as he fatigued.   ADL: ADL Overall ADL's : Needs assistance/impaired Eating/Feeding: Set up, Sitting Grooming: Wash/dry hands, Standing, Minimal assistance Grooming Details (indicate cue type and reason): While standing at the sink. Min assist for balance and safety.  Upper Body Bathing: Set up, Standing Lower Body Bathing: Min guard, Cueing for safety, Cueing for sequencing, Sit to/from stand, Sitting/lateral leans Upper Body Dressing : Set up, Sitting Lower Body Dressing: Min guard, Sitting/lateral leans, Sit to/from stand, Cueing for safety Toilet Transfer: Minimal assistance, Ambulation, Regular Toilet, Grab bars, RW Toilet Transfer Details (indicate cue type and reason): Pt able to ambulate to/from bathroom with RW and min assist for balance and safety with RW. Pt required max cues on body positioning with RW to increase balance and safety.  Toileting- Clothing Manipulation and Hygiene: Minimal assistance, Sit to/from stand Toileting - Clothing Manipulation Details (indicate cue type and reason): Assist for clothing management and balance Functional mobility during ADLs: Minimal assistance, Rolling walker, Cueing for safety General ADL Comments: Pt able to ambulate to/from bathroom with RW and min assist for balance and safety with RW. Pt tolerated standing additional 2 min at  the sink to wash hands requiring min assist for balance.    Cognition: Cognition Overall Cognitive Status: History of cognitive impairments - at baseline Orientation Level: Oriented to person, Oriented to place, Oriented to time, Disoriented to situation Cognition Arousal/Alertness: Awake/alert Behavior During Therapy: WFL for tasks assessed/performed Overall Cognitive Status: History of cognitive impairments - at baseline Area of Impairment: Attention, Memory, Safety/judgement, Awareness, Problem solving Current Attention Level: Sustained Memory: Decreased short-term memory Safety/Judgement: Decreased awareness of safety, Decreased awareness of deficits Awareness: Emergent Problem Solving: Requires verbal cues, Requires tactile cues General Comments: Frequent cues required for safety. Pt able to recall room number but assist required to find room.    Physical Exam: Blood pressure (!) 152/67, pulse 68, temperature 98 F (36.7 C), temperature source Axillary, resp. rate (!) 22, weight 92.2 kg, SpO2 97 %. Physical Exam  Constitutional: He is oriented to person, place, and time. No distress.  HENT:  Head: Normocephalic and atraumatic.  Nose: Nose normal.  Mouth/Throat: Oropharynx is clear and moist.  Multiple bruises about anterior/lateral neck/shoulder girdle area.   Eyes: Pupils are equal, round, and reactive to light. EOM are normal.  Neck: No tracheal deviation present. No thyromegaly present.  Cardiovascular: Normal rate and regular rhythm. Exam reveals no friction rub.  No murmur heard. Respiratory: Effort normal. No respiratory distress. He has no wheezes. He has no rales.  GI: Soft. He exhibits no distension. There is no abdominal tenderness.  Musculoskeletal:     Cervical back: Normal range of motion.  Neurological: He is alert and oriented to person, place, and time. He has normal reflexes.  Sitting up in bed. Follows basic commands. Reasonable insight and awareness. Some  delays in processing. no focal rigidity. Motor 4/5 UE bilaterally. LE: 3+/5 HF, KE and 4/5 ADF/F. No focal sensory findings. DTR's trace to 1+  Skin: Skin is warm. He is not diaphoretic.  Psychiatric:  Flat but cooperative      Lab Results Last 48 Hours        Results for orders placed or performed during the hospital encounter of 07/01/19 (from the past 48 hour(s))  CBC with Differential/Platelet     Status: Abnormal    Collection Time: 07/13/19  4:16 AM  Result Value Ref Range    WBC 7.4 4.0 - 10.5 K/uL    RBC 3.83 (L) 4.22 - 5.81 MIL/uL    Hemoglobin 13.1 13.0 - 17.0 g/dL    HCT 38.0 (L) 39.0 - 52.0 %    MCV 99.2 80.0 - 100.0 fL    MCH 34.2 (H) 26.0 - 34.0 pg    MCHC 34.5 30.0 - 36.0 g/dL    RDW 15.9 (H) 11.5 - 15.5 %    Platelets 117 (L) 150 - 400 K/uL      Comment: Immature Platelet Fraction may be clinically indicated, consider ordering this additional test [id number] PLATELET COUNT CONFIRMED BY SMEAR REPEATED TO VERIFY      nRBC 0.0 0.0 - 0.2 %    Neutrophils Relative % 47 %    Neutro Abs 3.4 1.7 - 7.7 K/uL    Lymphocytes Relative 34 %    Lymphs Abs 2.5 0.7 - 4.0 K/uL    Monocytes Relative 13 %    Monocytes Absolute 0.9 0.1 - 1.0 K/uL    Eosinophils Relative 5 %    Eosinophils Absolute 0.4 0.0 - 0.5 K/uL    Basophils Relative 1 %    Basophils Absolute 0.1 0.0 - 0.1 K/uL    Immature Granulocytes 0 %    Abs Immature Granulocytes 0.03 0.00 - 0.07 K/uL      Comment: Performed at [facility], [address], [place], [place] [address]  Comprehensive metabolic panel     Status: Abnormal    Collection Time: 07/13/19  4:16 AM  Result Value Ref Range    Sodium 143 135 - 145 mmol/L    Potassium 3.6 3.5 - 5.1 mmol/L    Chloride 106 98 - 111 mmol/L    CO2 29 22 - 32 mmol/L    Glucose, Bld 94 70 - 99 mg/dL      Comment: Glucose reference range applies only to samples taken after fasting for at least 8 hours.    BUN 10 8 - 23 mg/dL    Creatinine, Ser 0.90 0.61 - 1.24  mg/dL    Calcium 8.5 (L) 8.9 - 10.3 mg/dL  Total Protein 5.9 (L) 6.5 - 8.1 g/dL    Albumin 2.3 (L) 3.5 - 5.0 g/dL    AST 72 (H) 15 - 41 U/L    ALT 30 0 - 44 U/L    Alkaline Phosphatase 73 38 - 126 U/L    Total Bilirubin 1.6 (H) 0.3 - 1.2 mg/dL    GFR calc non Af Amer >60 >60 mL/min    GFR calc Af Amer >60 >60 mL/min    Anion gap 8 5 - 15      Comment: Performed at [facility] [address] [place], [place] [address]  Magnesium     Status: Abnormal    Collection Time: 07/13/19  4:16 AM  Result Value Ref Range    Magnesium 1.5 (L) 1.7 - 2.4 mg/dL      Comment: Performed at [facility] [address] [place], [place] [address]  Phosphorus     Status: None    Collection Time: 07/13/19  4:16 AM  Result Value Ref Range    Phosphorus 2.7 2.5 - 4.6 mg/dL      Comment: Performed at [facility] [address], [place], [place] [address]  CBC with Differential/Platelet     Status: Abnormal    Collection Time: 07/14/19  2:39 AM  Result Value Ref Range    WBC 8.9 4.0 - 10.5 K/uL    RBC 3.94 (L) 4.22 - 5.81 MIL/uL    Hemoglobin 13.2 13.0 - 17.0 g/dL    HCT 38.8 (L) 39.0 - 52.0 %    MCV 98.5 80.0 - 100.0 fL    MCH 33.5 26.0 - 34.0 pg    MCHC 34.0 30.0 - 36.0 g/dL    RDW 15.6 (H) 11.5 - 15.5 %    Platelets 120 (L) 150 - 400 K/uL      Comment: Immature Platelet Fraction may be clinically indicated, consider ordering this additional test [id number]      nRBC 0.0 0.0 - 0.2 %    Neutrophils Relative % 50 %    Neutro Abs 4.5 1.7 - 7.7 K/uL    Lymphocytes Relative 33 %    Lymphs Abs 2.9 0.7 - 4.0 K/uL    Monocytes Relative 11 %    Monocytes Absolute 1.0 0.1 - 1.0 K/uL    Eosinophils Relative 5 %    Eosinophils Absolute 0.4 0.0 - 0.5 K/uL    Basophils Relative 1 %    Basophils Absolute 0.1 0.0 - 0.1 K/uL    Immature Granulocytes 0 %    Abs Immature Granulocytes 0.02 0.00 - 0.07 K/uL      Comment: Performed at [facility], [place] [address]  [place], [place] [address]  Comprehensive metabolic panel     Status: Abnormal    Collection Time: 07/14/19  2:39 AM  Result Value Ref Range    Sodium 141 135 - 145 mmol/L    Potassium 3.6 3.5 - 5.1 mmol/L    Chloride 103 98 - 111 mmol/L    CO2 29 22 - 32 mmol/L    Glucose, Bld 108 (H) 70 - 99 mg/dL      Comment: Glucose reference range applies only to samples taken after fasting for at least 8 hours.    BUN 10 8 - 23 mg/dL    Creatinine, Ser 0.79 0.61 - 1.24 mg/dL    Calcium 8.6 (L) 8.9 - 10.3 mg/dL    Total Protein 5.8 (L) 6.5 -  8.1 g/dL    Albumin 2.3 (L) 3.5 - 5.0 g/dL    AST 73 (H) 15 - 41 U/L    ALT 30 0 - 44 U/L    Alkaline Phosphatase 77 38 - 126 U/L    Total Bilirubin 1.9 (H) 0.3 - 1.2 mg/dL    GFR calc non Af Amer >60 >60 mL/min    GFR calc Af Amer >60 >60 mL/min    Anion gap 9 5 - 15      Comment: Performed at [facility] [address] [place], [place] [address]  Magnesium     Status: Abnormal    Collection Time: 07/14/19  2:39 AM  Result Value Ref Range    Magnesium 1.6 (L) 1.7 - 2.4 mg/dL      Comment: Performed at [facility] [address], [place], [place] [address]  Phosphorus     Status: None    Collection Time: 07/14/19  2:39 AM  Result Value Ref Range    Phosphorus 2.5 2.5 - 4.6 mg/dL      Comment: Performed at [facility] [address] [place],  [address]       Imaging Results (Last 48 hours)  CT CHEST W CONTRAST   Result Date: 07/12/2019 CLINICAL DATA:  Abnormal right neck mass, concern for malignancy EXAM: CT CHEST WITH CONTRAST TECHNIQUE: Multidetector CT imaging of the chest was performed during intravenous contrast administration. CONTRAST:  50mL OMNIPAQUE IOHEXOL 300 MG/ML  SOLN COMPARISON:  07/03/2019, 09/28/2011 FINDINGS: Cardiovascular: Heart is not enlarged. No pericardial effusion. There is extensive atherosclerosis of the thoracic aorta and coronary vessels. There is a 4.6 cm ascending thoracic aortic aneurysm. No  dissection. Mediastinum/Nodes: Numerous subcentimeter lymph nodes are seen within the mediastinum. Largest lymph node in the right paratracheal region image 30 measures 9 mm in short axis and in the precarinal region image 58 measures 10 mm in short axis. The esophagus and trachea are unremarkable. The infiltrating soft tissue mass involving the right lobe thyroid is again seen, measuring approximately 3.5 x 3.5 cm on image 8, previously measuring approximately 3.9 x 4.0 cm at a similar location. There is continued retropharyngeal soft tissue edema. Lungs/Pleura: There are small bilateral pleural effusions, volume estimated less than 500 cc each. Minimal dependent atelectasis greatest in the left lower lobe. No airspace disease. No pneumothorax. Central airways are patent. Upper Abdomen: Shrunken nodular liver compatible cirrhosis. Extensive upper abdominal varices are seen. Borderline splenomegaly measuring 13 cm. Musculoskeletal: No acute displaced fractures. Chronic T12 compression deformity. Reconstructed images demonstrate no additional findings. IMPRESSION: 1. Slight decrease in the size of the infiltrating soft tissue right neck mass. Continued involvement of the right lobe thyroid with retropharyngeal and prevertebral soft tissue edema. Please refer to recent biopsy results. 2. Small bilateral pleural effusions with dependent lower lobe atelectasis. 3. Nonspecific borderline enlarged mediastinal lymph nodes. 4. Cirrhosis, with evidence of portal venous hypertension. 5.  Aortic Atherosclerosis (ICD10-I70.0). 6. 4.6 cm ascending thoracic aortic aneurysm. Recommend semi-annual imaging followup by CTA or MRA and referral to cardiothoracic surgery if not already obtained. This recommendation follows 2010 ACCF/AHA/AATS/ACR/ASA/SCA/SCAI/SIR/STS/SVM Guidelines for the Diagnosis and Management of Patients With Thoracic Aortic Disease. Circulation. 2010; 121: O536-U440. Aortic aneurysm NOS (ICD10-I71.9) Electronically  Signed   By: Randa Ngo M.D.   On: 07/12/2019 20:34    VAS [place] UPPER EXTREMITY VENOUS DUPLEX   Result Date: 07/13/2019 UPPER VENOUS STUDY  Indications: Swelling Risk Factors: None identified. Limitations:  Poor ultrasound/tissue interface. Comparison Study: No prior studies. Performing Technologist: Oliver Hum RVT  Examination Guidelines: A complete evaluation includes B-mode imaging, spectral Doppler, color Doppler, and power Doppler as needed of all accessible portions of each vessel. Bilateral testing is considered an integral part of a complete examination. Limited examinations for reoccurring indications may be performed as noted.  Right Findings: +----------+------------+---------+-----------+----------+-------+ RIGHT     CompressiblePhasicitySpontaneousPropertiesSummary +----------+------------+---------+-----------+----------+-------+ IJV           Full       Yes       Yes                      +----------+------------+---------+-----------+----------+-------+ Subclavian    Full       Yes       Yes                      +----------+------------+---------+-----------+----------+-------+ Axillary      Full       Yes       Yes                      +----------+------------+---------+-----------+----------+-------+ Brachial      Full       Yes       Yes                      +----------+------------+---------+-----------+----------+-------+ Radial        Full                                          +----------+------------+---------+-----------+----------+-------+ Ulnar         Full                                          +----------+------------+---------+-----------+----------+-------+ Cephalic      Full                                          +----------+------------+---------+-----------+----------+-------+ Basilic       Full                                          +----------+------------+---------+-----------+----------+-------+   Left Findings: +----------+------------+---------+-----------+----------+-------+ LEFT      CompressiblePhasicitySpontaneousPropertiesSummary +----------+------------+---------+-----------+----------+-------+ Subclavian    Full       Yes       Yes                      +----------+------------+---------+-----------+----------+-------+  Summary:  Right: No evidence of deep vein thrombosis in the upper extremity. No evidence of superficial vein thrombosis in the upper extremity.  Left: No evidence of thrombosis in the subclavian.  *See table(s) above for measurements and observations.  Diagnosing physician: Monica Martinez MD Electronically signed by Monica Martinez MD on 07/13/2019 at 8:10:02 PM.    Final              Medical Problem List and Plan: 1.  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  Follow-up medical oncology             -  admit to inpatient rehab             -patient may   shower             -ELOS/Goals: 5-7 days, supervision to mod I             -Tumor is not resectable. Oncology (Dr. Irene Limbo) recommends Rad-Onc consult which was placed today on acute. PET scan upon discharge from inpatient rehab (in about 2 weeks) per ENT (Dr. Blenda Nicely).  Palliative care involved as well.  2.  Antithrombotics: -DVT/anticoagulation: SCDs.  Right upper extremity Doppler negative.             -antiplatelet therapy: N/A 3. Pain Management: Currently on no Tylenol or pain medication 4. Mood: Lexapro 10 mg daily.  Provide emotional support             -antipsychotic agents: N/A 5. Neuropsych: This patient is capable of making decisions on his own behalf. 6. Skin/Wound Care: Routine skin checks 7. Fluids/Electrolytes/Nutrition: Routine in and outs with follow-up chemistries 8.  Acute hepatic/metabolic encephalopathy.  Continue lactulose 30 mg 3 times daily.  Latest ammonia level improved at 44.              -cognitively appears improved today 9.  Chronic diastolic congestive heart failure.  Lasix 20 mg daily.  Monitor for signs of fluid overload. Daily weights 10.  Hypertension.  Hydralazine 50 mg every 8 hours, Tenormin 25 mg daily, lisinopril 20 mg twice daily.  Monitor with increased mobility 11.  Myasthenia gravis/progressive gait disorder.  Patient on no medications.  Follow-up outpatient neurology Dr. Krista Blue 12.  Thrombocytopenia/elevated AST and total bilirubin.  Likely due to cirrhosis/liver disease.  Follow-up CBC. 13.  4.6 cm ascending thoracic aortic aneurysm.  Recommend semiannual imaging followed by CTA or MRA and referral to cardiothoracic surgery.       Lavon Paganini Angiulli, PA-C 07/14/2019  I have personally performed a face to face diagnostic evaluation of this patient and formulated the key components of the plan.  Additionally, I have personally reviewed laboratory data, imaging studies, as well as relevant notes and concur with the physician assistant's documentation above.  The patient's status has not changed from the original H&P.  Any changes in documentation from the acute care chart have been noted above.  Meredith Staggers, MD, Mellody Drown

## 2019-07-17 NOTE — Progress Notes (Signed)
Report given to RN on 4MW. All questions answered. Pt transferred to unit by 4E RN and NT. Patient belongings placed at bedside.  Arletta Bale

## 2019-07-17 NOTE — Progress Notes (Signed)
ENT Consult Progress Note  Subjective: Voice normal. No odynophagia, no stridor.  Poor appetite but no dysphagia. No fevers.  Has had intermittent confusion/disorientation.  Patient's wife is at bedside for discussion.    Objective: Vitals:   07/17/19 0902 07/17/19 1405  BP: (!) 130/51 (!) 120/51  Pulse: (!) 56 (!) 57  Resp: 18 17  Temp: 98.8 F (37.1 C) 98.2 F (36.8 C)  SpO2: 95% 98%    Physical Exam: CONSTITUTIONAL: well developed, nourished, no distress  CARDIOVASCULAR: normal rate and regular rhythm PULMONARY/CHEST WALL: effort normal and no stridor, no stertor, no dysphonia Nose: nose normal and no purulence EYES: conjunctiva normal, EOM normal and PERRL NECK: supple, trachea normal and no thyromegaly or cervical LAD Normal ROM neck Vague non-discrete fullness right neck. Smaller compared to prior examination.  There is some bruising in this region.   Data Review/Consults/Discussions:   FNA right neck mass-malignant cells Follow-up core ultrasound-guided needle biopsy demonstrate malignant cells question non-small cell lung cancer.  CT neck and chest reviewed.  CT neck does not demonstrate a resectable lesion.  Due to the relationship of the thyroid cartilage, esophagus and great vessels this mass is felt to be nonresectable.  I did review this image with my partner Dr. Constance Holster who is in agreement.  CT chest does demonstrate bilateral pleural effusions measuring less than 500 cc on both sides.  No obvious mass within the chest but there is some mediastinal lymphadenopathy.  Assessment: Glen Finley 85 y.o. male who presents with right neck mass with superimposed retropharyngeal fluid, from malignancy question non-small cell lung cancer.  There is mediastinal lymphadenopathy and bilateral pleural effusions in the setting of a nonresectable right neck mass adjacent to the thyroid.  Based on most recent imaging studies I do not think this represents an anaplastic carcinoma  but it could.  Certainly the metastatic malignancy appears to be aggressive due to the significant changes at a cellular level noted by pathology.   Plan:  Recommend radiation oncology consultation Recommend palliative consultation.  (Patient's family is in discussion regarding possible hospice involvement at a later date.) I will arrange for outpatient PET scan upon discharge from inpatient rehab, to be scheduled in approximately 2 weeks. I have added the patient  for discussion with our head and neck cancer tumor board which is scheduled for next week.   Thank you for involving [facility] in the care of this patient. Should you need further assistance, please call our office at [phone].    Gavin Pound, MD

## 2019-07-17 NOTE — TOC Transition Note (Signed)
Transition of Care [facility]) - CM/SW Discharge Note Marvetta Gibbons RN, BSN Transitions of Care Unit 4E- RN Case Manager [phone]   Patient Details  Name: Glen Finley MRN: [id number] Date of Birth: 1934/08/29  [facility]) CM/SW Contact:  Dawayne Patricia, RN Phone Number: 07/17/2019, 2:40 PM   Clinical Narrative:    Pt stable for transition to [facility] re[facility] today, Caitlin with [place] re[facility] has confirmed that pt has bed available today and they can admit.    Final next level of care: IP Rehab Facility Barriers to Discharge: Barriers Resolved   Patient Goals and CMS Choice Patient states their goals for this hospitalization and ongoing recovery are:: rehab      Discharge Placement               [facility]        Discharge Plan and Services                                     Social Determinants of Health (SDOH) Interventions     Readmission Risk Interventions Readmission Risk Prevention Plan 07/17/2019  Transportation Screening (No Data)  [place] or Home Care Consult Complete  Social Work Consult for [place] Planning/Counseling Complete  Palliative Care Screening Not Applicable  Medication Review [employer]) Complete  Some recent data might be hidden

## 2019-07-17 NOTE — Progress Notes (Addendum)
Patient care plan reviewed. No acute change or signs of distress. Patient denies pain. Alert and oriented but can become confused or forgetful. VS stable. BP 119/70 (87). HR 69. NSR on the monitor. RR 21. SpO2 94% on RA. Will continue to monitor. Adella Hare, RN

## 2019-07-17 NOTE — Progress Notes (Signed)
Inpatient Rehabilitation Medication Review by a Pharmacist  A complete drug regimen review was completed for this patient to identify any potential clinically significant medication issues.  Clinically significant medication issues were identified:  yes   1: lasix ordered to start today but dose already given inpt> moved to start tomorrow 2: MagOx bid ordered as one time dose - will f/u 4/27 if more mag needed     Time spent performing this drug regimen review (minutes):  [place], Pharm.D [phone] 07/17/2019 6:42 PM

## 2019-07-17 NOTE — Plan of Care (Signed)
  Problem: RH BOWEL ELIMINATION Goal: RH STG MANAGE BOWEL WITH ASSISTANCE Description: STG Manage Bowel independently  Outcome: Progressing   Problem: RH SKIN INTEGRITY Goal: RH STG SKIN FREE OF INFECTION/BREAKDOWN Outcome: Progressing Goal: RH STG MAINTAIN SKIN INTEGRITY WITH ASSISTANCE Description: STG Maintain Skin Integrity with mod I assist  Outcome: Progressing   Problem: RH SAFETY Goal: RH STG ADHERE TO SAFETY PRECAUTIONS W/ASSISTANCE/DEVICE Description: STG Adhere to Safety Precautions With mod I assist and a walker  Outcome: Progressing Goal: RH STG DECREASED RISK OF FALL WITH ASSISTANCE Description: STG Decreased Risk of Fall With mod I assist Outcome: Progressing   Problem: RH KNOWLEDGE DEFICIT GENERAL Goal: RH STG INCREASE KNOWLEDGE OF SELF CARE AFTER HOSPITALIZATION Description: Patient will have knowledge of how to safely take care of himself after discharge  Outcome: Progressing

## 2019-07-17 NOTE — Progress Notes (Signed)
PROGRESS NOTE    Glen Finley  LPF:[id number] DOB: 1934-08-05 DOA: 07/01/2019 PCP: Shon Baton, MD   Chief Complaint  Patient presents with  . Fever  . Altered Mental Status    Brief Narrative:  85 year old male with history of myasthenia gravis/progressive gait disorder not on meds, PD vs Lewy body disease, bipolar disorder, migraine, diastolic CHF, HTN, HLD, cirrhosis, transaminitis and fatty liver disease brought to ED by EMS for fever and altered mental status and admitted for sepsis.  He was started on broad-spectrum antibiotics.  Later in the course, he was diagnosed with retropharyngeal and right neck abscess.    MRI cervical spine on 4/11 revealed abnormal retropharyngeal/prevertebral edema with associated phlegmonous change within the lower right neck concerning for acute infection with probable superimposed 2 cm abscess.  CT soft tissue neck with contrast on 4/12 redemonstrated abnormal prevertebral/retropharyngeal fluid or edema extending from the C1-C2 level inferiorly to the C7 level. Extensive inflammatory stranding and phlegmonous change extending into the right neck soft tissues centered at the C5 level. Findings suspicious for an associated 3.0 x 2.0 abscess along the right posterior aspect of the thyroid cartilage at the C5 level. Heterogeneous enhancement of the right thyroid lobe suspicious for secondary thyroiditis  ENT, ID and IR consulted.  Patient had US guided FNA of right neck mass by IR on 4/13.  Culture with rare GPC.  Blood cultures negative so far.  He was on vancomycin and ceftriaxone, and transitioned to Unasyn on 4/14 .  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  Oncology and ENT recommended repeat biopsy which was done on 4/19.  Pathology pending.  ID recommended discontinuing antibiotic if repeat biopsy confirms malignancy.  Evaluated by PT/OT who recommended CIR.  Waiting on insurance authorization and CIR bed.  Assessment & Plan:   Principal  Problem:   Retropharyngeal abscess Active Problems:   Hypertension   Myasthenia gravis without exacerbation (HCC)   Acute hepatic encephalopathy   Sepsis ([place])   Liver cirrhosis (HCC)   Metastatic carcinoma (HCC)   Thyroid cancer (HCC)  Acute hepatic/metabolic encephalopathy, fatty liver disease, cirrhosis:  - continue to monitor for now - this is improved  -Continue lactulose to 30 mg 3 times daily and titrate for 2-3 BM Glen Finley day. -Frequent orientation delirium precautions.  Right neck mass  Metastatic Poorly Differentiated Carcinoma: Cytology from [place] FNA by IR on 4/13 with malignant cells.  - Pathology from 4/19 with metastatic poorly differentiated carcinoma - CT abd/pelvis on admission without malignancy.  CT chest with contrast with slight decrease in size of infitrating soft tissue R neck mass with continued involvement of the R lobe thyroid with retropharngeal and prevertebral soft tissue edema, small bilateral effusions, nonspecific borderline enlarged mediastinal LN's, and cirrhosis. - discussed with oncology -> see note from 4/22 -> recommending ENT determine if locally resectable tumor?  Discussed with ENT today, Dr. Blenda Nicely, will follow up, but from ENT standpoint, ready for discharge - Dr. Blenda Nicely from ENT following  - RUE [place] with swelling, no evidence of DVT (prelim)  Sepsis, secondary to retropharyngeal infection/right neck abscess-clinically improving. Blood cultures and MRSA PCR negative. MRI cervical spine w/wo contrast and CT soft tissue of neck with contrast as above. Tissue cultures from 4/13 with staph epidermis.  Initial cytology with malignant cells.   -Follow pathology from repeat biopsy -Vancomycin and ceftriaxone>>>Unasyn.  Will stop abx given malignancy on repeat biopsy.   Acute respiratory failure with hypoxia-likely due to the above.  Resolved.  On  room air. -Encourage incentive spirometry and OOB  Chronic diastolic heart failure: Stable -Resume home  Lasix. -Monitor intake and output  Myasthenia gravis, progressive gait disorder -Follow-up with neurology outpatient -PT/OT-recommended CIR.  CIR following.  -Plan for CIR when bed available  Essential hypertension: Blood pressure elevated. -Continue lisinopril, atenolol and hydralazine -Increase as needed hydralazine to 10 mg -Resume home Lasix  Depression: Stable -Continue amitriptyline, lexapro   Thrombocytopenia: Likely due to cirrhosis.  Appears to fluctuate widely.  -Continue monitoring  Hypokalemia: replace and follow  4.6 cm ascending thoracic aortic aneurysm: needs semi annual imaging and CT surgery follow up  DVT prophylaxis: Scd Code Status: full Family Communication: wife at bedside Disposition:   Status is: Inpatient  Remains inpatient appropriate because:Inpatient level of care appropriate due to severity of illness   Dispo: The patient is from: Home              Anticipated d/c is to: CIR              Anticipated d/c date is: 1 day              Patient currently is not medically stable to d/c.   Consultants:   Oncology  ENT  ID  IR  Procedures:  FNA biopsy 4/13 [place] core biopsy 4/19  Antimicrobials:  Anti-infectives (From admission, onward)   Start     Dose/Rate Route Frequency Ordered Stop   07/07/19 0000  amoxicillin-clavulanate (AUGMENTIN) 875-125 MG tablet     1 tablet Oral 2 times daily 07/07/19 0940 07/28/19 2359   07/05/19 1300  Ampicillin-Sulbactam (UNASYN) 3 g in sodium chloride 0.9 % 100 mL IVPB  Status:  Discontinued     3 g 200 mL/hr over 30 Minutes Intravenous Every 6 hours 07/05/19 1226 07/12/19 1643   07/04/19 2300  vancomycin (VANCOREADY) IVPB 1750 mg/350 mL  Status:  Discontinued     1,750 mg 175 mL/hr over 120 Minutes Intravenous Every 24 hours 07/04/19 0754 07/05/19 1226   07/03/19 1600  cefTRIAXone (ROCEPHIN) 2 g in sodium chloride 0.9 % 100 mL IVPB  Status:  Discontinued     2 g 200 mL/hr over 30 Minutes  Intravenous Every 24 hours 07/03/19 1437 07/05/19 1226   07/02/19 2300  vancomycin (VANCOREADY) IVPB 1500 mg/300 mL  Status:  Discontinued     1,500 mg 150 mL/hr over 120 Minutes Intravenous Every 24 hours 07/01/19 2223 07/04/19 0754   07/02/19 0600  ceFEPIme (MAXIPIME) 2 g in sodium chloride 0.9 % 100 mL IVPB  Status:  Discontinued     2 g 200 mL/hr over 30 Minutes Intravenous Every 8 hours 07/01/19 2223 07/03/19 1437   07/02/19 0600  metroNIDAZOLE (FLAGYL) IVPB 500 mg  Status:  Discontinued     500 mg 100 mL/hr over 60 Minutes Intravenous Every 8 hours 07/01/19 2350 07/05/19 1226   07/01/19 2130  vancomycin (VANCOREADY) IVPB 1750 mg/350 mL     1,750 mg 175 mL/hr over 120 Minutes Intravenous  Once 07/01/19 2115 07/02/19 0235   07/01/19 2115  ceFEPIme (MAXIPIME) 2 g in sodium chloride 0.9 % 100 mL IVPB  Status:  Discontinued     2 g 200 mL/hr over 30 Minutes Intravenous Every 12 hours 07/01/19 2109 07/01/19 2223   07/01/19 2115  metroNIDAZOLE (FLAGYL) IVPB 500 mg     500 mg 100 mL/hr over 60 Minutes Intravenous  Once 07/01/19 2109 07/02/19 0025   07/01/19 2115  vancomycin (VANCOCIN) IVPB 1000 mg/200 mL premix  Status:  Discontinued     1,000 mg 200 mL/hr over 60 Minutes Intravenous  Once 07/01/19 2109 07/01/19 2115      Subjective: No complaints  Objective: Vitals:   07/16/19 2201 07/17/19 0531 07/17/19 0617 07/17/19 0902  BP: 119/70 (!) 150/60 (!) 152/62 (!) 130/51  Pulse: 69 65 69 (!) 56  Resp: (!) 22 (!) 21  18  Temp:  98.7 F (37.1 C)  98.8 F (37.1 C)  TempSrc:  Oral  Oral  SpO2: 92% 93%  95%  Weight:  87.3 kg      Intake/Output Summary (Last 24 hours) at 07/17/2019 1129 Last data filed at 07/17/2019 0900 Gross per 24 hour  Intake 610 ml  Output 750 ml  Net -140 ml   Filed Weights   07/15/19 0500 07/16/19 0345 07/17/19 0531  Weight: 87.9 kg 85.6 kg 87.3 kg    Examination:  General: No acute distress. R neck mass Cardiovascular: Heart sounds show Glen Finley regular  rate, and rhythm. Lungs: Clear to auscultation bilaterally  Abdomen: Soft, nontender, nondistended  Neurological: Alert and oriented 3. Moves all extremities 4. Cranial nerves II through XII grossly intact. Skin: Warm and dry. No rashes or lesions. Extremities: No clubbing or cyanosis. No edema.   Data Reviewed: I have personally reviewed following labs and imaging studies  CBC: Recent Labs  Lab 07/13/19 0416 07/14/19 0239 07/15/19 0236 07/16/19 0336 07/17/19 0122  WBC 7.4 8.9 7.7 7.0 8.5  NEUTROABS 3.4 4.5 4.1 3.1 4.6  HGB 13.1 13.2 13.7 13.5 12.7*  HCT 38.0* 38.8* 39.8 40.7 38.4*  MCV 99.2 98.5 98.3 101.5* 100.3*  PLT 117* 120* 107* 111* 107*    Basic Metabolic Panel: Recent Labs  Lab 07/13/19 0416 07/14/19 0239 07/15/19 0236 07/16/19 0336 07/17/19 0122  NA 143 141 142 142 141  K 3.6 3.6 3.4* 3.8 4.5  CL 106 103 105 107 109  CO2 29 29 28 27 24   GLUCOSE 94 108* 96 88 88  BUN 10 10 7* 11 15  CREATININE 0.90 0.79 0.84 0.89 0.93  CALCIUM 8.5* 8.6* 8.3* 8.3* 8.2*  MG 1.5* 1.6* 1.7 1.8 1.8  PHOS 2.7 2.5 2.5 2.5 2.1*    GFR: Estimated Creatinine Clearance: 65.8 mL/min (by C-G formula based on SCr of 0.93 mg/dL).  Liver Function Tests: Recent Labs  Lab 07/13/19 0416 07/14/19 0239 07/15/19 0236 07/16/19 0336 07/17/19 0122  AST 72* 73* 68* 59* 78*  ALT 30 30 28 25 15   ALKPHOS 73 77 69 65 70  BILITOT 1.6* 1.9* 2.1* 1.9* 2.2*  PROT 5.9* 5.8* 6.0* 6.0* 5.6*  ALBUMIN 2.3* 2.3* 2.3* 2.3* 2.2*    CBG: No results for input(s): GLUCAP in the last 168 hours.   No results found for this or any previous visit (from the past 240 hour(s)).       Radiology Studies: No results found.      Scheduled Meds: . amitriptyline  25 mg Oral QHS  . atenolol  25 mg Oral q AM  . escitalopram  10 mg Oral q AM  . furosemide  20 mg Oral Daily  . hydrALAZINE  50 mg Oral Q8H  . lactulose  30 g Oral TID  . lisinopril  20 mg Oral BID  . sodium chloride flush  10-40 mL  Intracatheter Q12H   Continuous Infusions:    LOS: 16 days    Time spent: over 30 min    Fayrene Helper, MD [facility]   To contact the  attending provider between 7A-7P or the covering provider during after hours 7P-7A, please log into the web site [email or web address] and access using universal [place] password for that web site. If you do not have the password, please call the hospital operator.  07/17/2019, 11:29 AM

## 2019-07-17 NOTE — Progress Notes (Signed)
Followed up with pt and wife today after CIR admission on 4/25 was postponed.  Informed them that pt continues to be appropriate candidate for CIR.  Informed them there are no beds available at this time.  Will continue to keep them informed on potential admission date to CIR.  Gayland Curry, [place], [place] Admissions Coordinator [phone]

## 2019-07-17 NOTE — Discharge Summary (Addendum)
Physician Discharge Summary  Glen Finley YIF:[id number] DOB: 11-May-1934 DOA: 07/01/2019  PCP: Shon Baton, MD  Admit date: 07/01/2019 Discharge date: 07/17/2019  Time spent: 40 minutes  Recommendations for Outpatient Follow-up:  1. Follow outpatient CBC/CMP 2. Follow with oncology outpatient 3. Follow ENT recommendations 4. Radiation oncology consult has been placed, please follow up recommendations  5. I've also consulted palliative care who can see him while he's in CIR 6. Needs semi annual imaging outpatient for his 4.6 cm ascending thoracic aortic aneurysm - also needs CT surgery follow up  Discharge Diagnoses:  Principal Problem:   Retropharyngeal abscess Active Problems:   Hypertension   Myasthenia gravis without exacerbation ([place])   Acute hepatic encephalopathy   Sepsis ([place])   Liver cirrhosis ([place])   Metastatic carcinoma ([place])   Thyroid cancer [facility])   Discharge Condition: stable  Diet recommendation: regular  Filed Weights   07/15/19 0500 07/16/19 0345 07/17/19 0531  Weight: 87.9 kg 85.6 kg 87.3 kg   History of present illness:  85 year old male with history of myasthenia gravis/progressive gait disorder not on meds, PD vs Lewy body disease, bipolar disorder, migraine, diastolic CHF, HTN, HLD, cirrhosis, transaminitis and fatty liver disease brought to ED by EMS for fever and altered mental status and admitted for sepsis. He was started on broad-spectrum antibiotics. Later in the course, he was diagnosed with retropharyngeal and right neck abscess.   MRI cervical spine on 4/11 revealed abnormal retropharyngeal/prevertebral edema with associated phlegmonous change within the lower right neck concerning for acute infection with probable superimposed 2 cm abscess.  CT soft tissue neck with contrast on 4/12 redemonstrated abnormal prevertebral/retropharyngeal fluid or edema extending from the C1-C2 level inferiorly to the C7 level. Extensive inflammatory stranding  and phlegmonous change extending into the right neck soft tissues centered at the C5 level. Findings suspicious for an associated 3.0 x 2.0 abscess along the right posterior aspect of the thyroid cartilage at the C5 level. Heterogeneous enhancement of the right thyroid lobe suspicious for secondary thyroiditis  ENT, ID and IRconsulted.Patient had US guided FNA of right neck mass by IR on 4/13. Culture with rare GPC. Blood cultures negative so far. He was on vancomycin and ceftriaxone, and transitioned to Unasyn on 4/14 . 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 Oncology and ENT recommended repeat biopsywhich was done on4/19. Pathology showed metastatic poorly differentiated carcinoma. Antibiotics were discontinued per ID recommendations with malignancy on bx.  Seen by Dr. Irene Limbo from oncology who recommended having ENT determine if locally resectable tumor (per my discussion with ENT today, not resectable - updated note pending).  Spoke to Dr. Irene Limbo who in this setting would recommend radiation oncology c/s.  Rad oncology c/s has been placed and is pending.  Updated ENT recommendations pending from today.  To be discharged to CIR today, 4/26.   Hospital Course:  Acute hepatic/metabolic encephalopathy, fatty liver disease, cirrhosis:  -continue to monitor for now - this is improved  -Continue lactulose to 30 mg 3 times daily and titrate for 2-3 BM Edlyn Rosenburg day. -Frequent orientation delirium precautions.  Right neck mass  Metastatic Poorly Differentiated Carcinoma: Cytology from [place] FNA by IR on 4/13 with malignant cells. - Pathology from 4/19 with metastatic poorly differentiated carcinoma - CT abd/pelvis on admission without malignancy.  CT chest with contrast with slight decrease in size of infitrating soft tissue R neck mass with continued involvement of the R lobe thyroid with retropharngeal and prevertebral soft tissue edema, small bilateral effusions, nonspecific borderline enlarged  mediastinal LN's, and cirrhosis. - discussed with oncology -> see note from 4/22 -> recommending ENT determine if locally resectable tumor?  Per discussion with Dr. Blenda Nicely, not locally resectable (ENT note pending today) - ? If thoracentesis maybe helpful, discussed with oncology Irene Limbo) who did not think this would be likely to change management - Discussed with Dr. Irene Limbo who recommended radiation oncology consult.   - follow up with Dr. Irene Limbo outpatient  - radiation oncology c/s has been placed - Dr. Blenda Nicely from ENT following  - RUE [place] with swelling, no evidence of DVT (prelim)  Sepsis, secondary to retropharyngeal infection/right neck abscess-clinically improving. Blood cultures and MRSA PCR negative. MRI cervical spine w/wo contrast and CT soft tissue of neck with contrast as above. Tissue culturesfrom 4/13with staph epidermis. Initial cytology with malignant cells.  -Follow pathology from repeat biopsy -Vancomycin and ceftriaxone>>>Unasyn. Will stop abx given malignancy on repeat biopsy.   Acute respiratory failure with hypoxia-likely due to the above. Resolved. On room air. -Encourage incentive spirometry and OOB  Chronic diastolic heart failure: Stable -Resume home Lasix. -Monitor intake and output  Myasthenia gravis, progressive gait disorder -Follow-up with neurology outpatient -PT/OT-recommended CIR. CIR following.  -Plan for CIR when bed available  Essential hypertension:Blood pressure elevated. -Continue lisinopril, atenololandhydralazine -Increase as needed hydralazine to 10 mg -Resume home Lasix  Depression: Stable -Continue amitriptyline, lexapro   Thrombocytopenia: Likely due to cirrhosis. Appears to fluctuate widely.  -Continue monitoring  Hypokalemia: replace and follow  4.6 cm ascending thoracic aortic aneurysm: needs semi annual imaging and CT surgery follow up  Procedures: FNA biopsy 4/13 [place] core biopsy 4/19  RUE [place] Summary:     Right:  No evidence of deep vein thrombosis in the upper extremity. No evidence of  superficial vein thrombosis in the upper extremity.    Left:  No evidence of thrombosis in the subclavian.    Consultations:  ENT  Oncology  IR  ID  Rad onc  Discharge Exam: Vitals:   07/17/19 0902 07/17/19 1405  BP: (!) 130/51 (!) 120/51  Pulse: (!) 56 (!) 57  Resp: 18 17  Temp: 98.8 F (37.1 C) 98.2 F (36.8 C)  SpO2: 95% 98%   No new complaints today Wife at bedside, discussed d/c recomemndations  General: No acute distress. HEENT: r sided neck mass Cardiovascular: Heart sounds show Jaxyn Mestas regular rate, and rhythm.  Lungs: Clear to auscultation bilaterally Abdomen: Soft, nontender, nondistended  Neurological: Alert and oriented 3. Moves all extremities 4. Cranial nerves II through XII grossly intact. Skin: Warm and dry. No rashes or lesions. Extremities: No clubbing or cyanosis. No edema  Discharge Instructions   Discharge Instructions    Diet - low sodium heart healthy   Complete by: As directed    Discharge instructions   Complete by: As directed    You were seen for altered mental status (confusion) and concern for an infection.  You were treated with lactulose for your confusion, which has improved.  You were initially treated with antibiotics for your neck mass, but this showed malignancy on the biopsy and your antibiotics have now been discontinued.  You'll need to follow up with oncology as an outpatient.  Radiation oncology has been consulted as well.  You have Mariana Wiederholt thoracic aneurysm which will need semi annual imaging and follow up with CT surgery.  Return for new, recurrent, or worsening symptoms.  Please ask your PCP to request records from this hospitalization so they know what was done and what the  next steps will be.   Increase activity slowly   Complete by: As directed      Allergies as of 07/17/2019      Reactions   Amlodipine Swelling, Other (See  Comments)   CAUSED SEVERE SWELLING OF THE LEGS THAT REQUIRED Gwynneth Fabio TRIP TO THE MD'S OFFICE   Cherry Other (See Comments)   Migraines   Onion Other (See Comments)   Can eat cooked onions ONLY (or, patient develops migraines)   Codeine Rash      Medication List    TAKE these medications   alendronate 70 MG tablet Commonly known as: FOSAMAX Take 70 mg by mouth every Sunday.   amitriptyline 25 MG tablet Commonly known as: ELAVIL Take 25 mg by mouth at bedtime.   aspirin EC 81 MG tablet Take 81 mg by mouth See admin instructions. Take 81 mg by mouth at bedtime on Mon/Wed/Fri (only)   atenolol 25 MG tablet Commonly known as: TENORMIN Take 25 mg by mouth in the morning.   escitalopram 10 MG tablet Commonly known as: LEXAPRO Take 10 mg by mouth in the morning.   fish oil-omega-3 fatty acids 1000 MG capsule Take 1 g by mouth 2 (two) times daily.   furosemide 20 MG tablet Commonly known as: LASIX Take 20 mg by mouth daily as needed for fluid or edema.   hydrALAZINE 50 MG tablet Commonly known as: APRESOLINE Take 1 tablet (50 mg total) by mouth every 8 (eight) hours.   lactulose 10 GM/15ML solution Commonly known as: CHRONULAC Take 45 mLs (30 g total) by mouth 3 (three) times daily. (titrate for 2-3 BM's daily) What changed:   how much to take  when to take this  additional instructions   lisinopril 20 MG tablet Commonly known as: ZESTRIL Take 20 mg by mouth 2 (two) times daily.   One-Kimala Horne-Day Mens 50+ Tabs Take 1 tablet by mouth daily with breakfast.   SALONPAS ARTHRITIS PAIN RELIEF EX Apply 1 application topically daily as needed (for right knee pain).   Systane Ultra PF 0.4-0.3 % Soln Generic drug: Polyethyl Glyc-Propyl Glyc PF Place 1 drop into both eyes 2 (two) times daily as needed (for eye redness or burning).   Vitamin D-3 25 MCG (1000 UT) Caps Take 1,000 Units by mouth daily with breakfast.      Allergies  Allergen Reactions  . Amlodipine Swelling and  Other (See Comments)    CAUSED SEVERE SWELLING OF THE LEGS THAT REQUIRED Tyresse Jayson TRIP TO THE MD'S OFFICE  . Cherry Other (See Comments)    Migraines   . Onion Other (See Comments)    Can eat cooked onions ONLY (or, patient develops migraines)  . Codeine Rash      The results of significant diagnostics from this hospitalization (including imaging, microbiology, ancillary and laboratory) are listed below for reference.    Significant Diagnostic Studies: DG Chest 2 View  Result Date: 07/01/2019 CLINICAL DATA:  Altered mental status with fever EXAM: CHEST - 2 VIEW COMPARISON:  06/28/2019, CT chest 09/28/2011 FINDINGS: Mild cardiomegaly. No focal consolidation or pleural effusion. Aortic atherosclerosis. No pneumothorax IMPRESSION: 1. No acute focal airspace disease 2. Mild cardiomegaly Electronically Signed   By: Donavan Foil M.D.   On: 07/01/2019 21:29   CT Head Wo Contrast  Result Date: 06/28/2019 CLINICAL DATA:  Confusion. Increased confusion over the past 2 days. EXAM: CT HEAD WITHOUT CONTRAST TECHNIQUE: Contiguous axial images were obtained from the base of the skull through the vertex without  intravenous contrast. COMPARISON:  Head CT and brain MRI May 2013 FINDINGS: Brain: No intracranial hemorrhage, mass effect, or midline shift. Age related atrophy. No hydrocephalus. The basilar cisterns are patent. Moderate to advanced chronic small vessel ischemia. No evidence of territorial infarct or acute ischemia. No extra-axial or intracranial fluid collection. Vascular: Atherosclerosis of skullbase vasculature without hyperdense vessel or abnormal calcification. Skull: No fracture or focal lesion. Sinuses/Orbits: Complete opacification of left side of sphenoid sinus with high-density material. Opacification of posterior left ethmoid air cells. There is some surrounding bony sclerosis. Mild mucosal thickening of right side of sphenoid sinus. Mastoid air cells are clear. Bilateral cataract resection. Other:  None. IMPRESSION: 1. No acute intracranial abnormality. 2. Age related atrophy. Moderate to advanced chronic small vessel ischemia. 3. Opacification of left sphenoid sinus and posterior ethmoid air cells with high density material. There is adjacent bony sclerosis suggesting this is chronic sinusitis, however was not seen on 2013 exam. Electronically Signed   By: Keith Rake M.D.   On: 06/28/2019 17:47   CT SOFT TISSUE NECK W CONTRAST  Result Date: 07/03/2019 CLINICAL DATA:  Provided INDICATION: Retropharyngeal/prevertebral edema, abscess, sepsis. EXAM: CT NECK WITH CONTRAST TECHNIQUE: Multidetector CT imaging of the neck was performed using the standard protocol following the bolus administration of intravenous contrast. CONTRAST:  39mL OMNIPAQUE IOHEXOL 300 MG/ML  SOLN COMPARISON:  Cervical spine MRI 07/02/2019, maxillofacial CT 07/02/2019. FINDINGS: Pharynx and larynx: Streak artifact from dental restoration limits evaluation of the oral cavity. There is no appreciable discrete mass or swelling within the oral cavity or nasopharynx. Again demonstrated is abnormal prevertebral/retropharyngeal fluid or edema extending from the C1-C2 level inferiorly to the C7 level. Centered at the C5-C6 level there is prominent inflammatory stranding and phlegmon extending into the right neck soft tissues measuring 4.0 x 4.6 x 7.5 cm (for instance as seen on series 10, image 88 and series 8, image 44). Within this region at the C5 level, there is Kagen Kunath somewhat circumscribed region of hypoattenuation along the right posterior aspect of the thyroid cartilage measuring 3.0 x 2.0 cm in transaxial dimensions (series 10, image 74). Although no well-defined peripheral enhancement is demonstrated, findings are suspicious for early abscess formation. There is associated leftward deviation of the laryngeal airway and subglottic trachea without significant airway narrowing. There is rightward displacement of the right internal jugular  vein and common carotid artery. There is heterogeneous enhancement of the right thyroid lobe suggestive of thyroiditis. Salivary glands: Unremarkable. Thyroid: Heterogeneous appearance of the right thyroid lobe with extensive surrounding phlegmonous change/abscess as described below. There are few coarse calcifications within the left thyroid lobe. Lymph nodes: Right cervical chain lymphadenopathy is likely reactive. Most notably Clevester Helzer right level III lymph node measures 13 mm in short axis (series 8, image 30). Vascular: The major vascular structures of the neck appear patent. The right internal jugular vein is significantly narrowed at the level of the mid neck due to phlegmonous change/abscess. Limited intracranial: No abnormality identified. Visualized orbits: Excluded from the field of view. Mastoids and visualized paranasal sinuses: The paranasal sinuses are incompletely imaged. Redemonstrated complete opacification of the left sphenoid sinus with associated reactive osteitis. No significant mastoid effusion. Skeleton: No acute bony abnormality. Redemonstrated cervical spondylosis with multilevel disc space narrowing, posterior disc osteophytes as well as uncovertebral hypertrophy. Upper chest: No consolidation within the imaged lung apices. IMPRESSION: Redemonstrated abnormal prevertebral/retropharyngeal fluid or edema extending from the C1-C2 level inferiorly to the C7 level. Extensive inflammatory stranding and phlegmonous change extending  into the right neck soft tissues centered at the C5 level. Findings suspicious for an associated 3.0 x 2.0 abscess along the right posterior aspect of the thyroid cartilage at the C5 level. Heterogeneous enhancement of the right thyroid lobe suspicious for secondary thyroiditis. Findings likely reflect sequela of an infectious process. Close clinical follow-up is recommended with repeat imaging as warranted to exclude alternative etiologies (i.e. Neoplasm). Right cervical  lymphadenopathy, likely reactive. Chronic left sphenoid sinusitis. Electronically Signed   By: Kellie Simmering DO   On: 07/03/2019 14:35   CT CHEST W CONTRAST  Result Date: 07/12/2019 CLINICAL DATA:  Abnormal right neck mass, concern for malignancy EXAM: CT CHEST WITH CONTRAST TECHNIQUE: Multidetector CT imaging of the chest was performed during intravenous contrast administration. CONTRAST:  76mL OMNIPAQUE IOHEXOL 300 MG/ML  SOLN COMPARISON:  07/03/2019, 09/28/2011 FINDINGS: Cardiovascular: Heart is not enlarged. No pericardial effusion. There is extensive atherosclerosis of the thoracic aorta and coronary vessels. There is Carl Butner 4.6 cm ascending thoracic aortic aneurysm. No dissection. Mediastinum/Nodes: Numerous subcentimeter lymph nodes are seen within the mediastinum. Largest lymph node in the right paratracheal region image 30 measures 9 mm in short axis and in the precarinal region image 58 measures 10 mm in short axis. The esophagus and trachea are unremarkable. The infiltrating soft tissue mass involving the right lobe thyroid is again seen, measuring approximately 3.5 x 3.5 cm on image 8, previously measuring approximately 3.9 x 4.0 cm at Jariah Jarmon similar location. There is continued retropharyngeal soft tissue edema. Lungs/Pleura: There are small bilateral pleural effusions, volume estimated less than 500 cc each. Minimal dependent atelectasis greatest in the left lower lobe. No airspace disease. No pneumothorax. Central airways are patent. Upper Abdomen: Shrunken nodular liver compatible cirrhosis. Extensive upper abdominal varices are seen. Borderline splenomegaly measuring 13 cm. Musculoskeletal: No acute displaced fractures. Chronic T12 compression deformity. Reconstructed images demonstrate no additional findings. IMPRESSION: 1. Slight decrease in the size of the infiltrating soft tissue right neck mass. Continued involvement of the right lobe thyroid with retropharyngeal and prevertebral soft tissue edema.  Please refer to recent biopsy results. 2. Small bilateral pleural effusions with dependent lower lobe atelectasis. 3. Nonspecific borderline enlarged mediastinal lymph nodes. 4. Cirrhosis, with evidence of portal venous hypertension. 5.  Aortic Atherosclerosis (ICD10-I70.0). 6. 4.6 cm ascending thoracic aortic aneurysm. Recommend semi-annual imaging followup by CTA or MRA and referral to cardiothoracic surgery if not already obtained. This recommendation follows 2010 ACCF/AHA/AATS/ACR/ASA/SCA/SCAI/SIR/STS/SVM Guidelines for the Diagnosis and Management of Patients With Thoracic Aortic Disease. Circulation. 2010; 121: B096-G836. Aortic aneurysm NOS (ICD10-I71.9) Electronically Signed   By: Randa Ngo M.D.   On: 07/12/2019 20:34   MR CERVICAL SPINE W WO CONTRAST  Result Date: 07/03/2019 CLINICAL DATA:  Initial evaluation for acute sepsis, unknown source, concern for discitis. EXAM: MRI CERVICAL SPINE WITHOUT AND WITH CONTRAST TECHNIQUE: Multiplanar and multiecho pulse sequences of the cervical spine, to include the craniocervical junction and cervicothoracic junction, were obtained without and with intravenous contrast. CONTRAST:  28mL GADAVIST GADOBUTROL 1 MMOL/ML IV SOLN COMPARISON:  Prior maxillofacial CT from earlier the same day. FINDINGS: Alignment: Mild straightening of the normal cervical lordosis. No listhesis. Vertebrae: Vertebral body height maintained without evidence for acute or chronic fracture. Bone marrow signal intensity somewhat diffusely decreased on T1 weighted imaging, nonspecific, but most commonly related to anemia, smoking, or obesity. No discrete or worrisome osseous lesions. Mild reactive endplate changes present about the C4-5 through C6-7 interspaces, degenerative in nature. No findings to suggest osteomyelitis discitis  or septic arthritis. No abnormal marrow edema or enhancement. Cord: Signal intensity within the cervical spinal cord is normal. No abnormal enhancement. No epidural  abscess or other collection. Posterior Fossa, vertebral arteries, paraspinal tissues: Age-related cerebral atrophy noted within the visualized brain. Craniocervical junction normal. There is abnormal prevertebral/retropharyngeal edema extending from the nasopharynx through the upper thoracic spine. Abnormal edema with phlegmonous change seen involving the right parapharyngeal space, most pronounced at the level of the thyroid cartilage at approximately level III/IV (series 9, image 29). Finding concerning for acute infection given provided history. There is suggestion of Genice Kimberlin superimposed abscess at this level measuring up to approximately 2 cm (series 12, image 24), incompletely visualized. No frank loculated retropharyngeal abscess or collection. Prominent right level II/III lymph node measures up to 12 mm, likely reactive (series 8, image 20). Normal flow void seen within the vertebral arteries bilaterally. Disc levels: C2-C3: Small central disc protrusion minimally indents the ventral thecal sac. No canal or foraminal stenosis. C3-C4: Moderate-sized central disc protrusion indents the ventral thecal sac, contacting and mildly flattening the ventral spinal cord. Resultant moderate spinal stenosis. Superimposed bilateral uncovertebral and facet hypertrophy. Resultant mild left greater than right C4 foraminal stenosis. C4-C5: Chronic intervertebral disc space narrowing with mild diffuse disc bulge. Right greater than left uncovertebral hypertrophy. Broad posterior disc osteophyte flattens and partially faces the ventral thecal sac, greater on the right. Resultant mild-to-moderate spinal stenosis with mild flattening of the right hemi cord. Severe right with moderate left C5 foraminal stenosis. C5-C6: Diffuse disc bulge with bilateral uncovertebral hypertrophy. Broad posterior component effaces the ventral thecal sac and resultant moderate spinal stenosis. Severe right with moderate left C6 foraminal narrowing. C6-C7:  Diffuse disc bulge with bilateral uncovertebral hypertrophy. Mild ligament flavum thickening. Resultant mild spinal stenosis without cord impingement. Moderate left worse than right C7 foraminal narrowing. C7-T1:  Negative interspace.  Mild facet hypertrophy.  No stenosis. Visualized upper thoracic spine demonstrates no significant finding. IMPRESSION: 1. Abnormal retropharyngeal/prevertebral edema with associated phlegmonous change within the lower right neck as above, concerning for acute infection. Probable superimposed abscess measuring up to approximately 2 cm, incompletely visualized on this exam. Further evaluation with dedicated soft tissue neck CT with contrast recommended for further evaluation. 2. No evidence for osteomyelitis discitis within the cervical spine. 3. Multilevel cervical spondylosis with resultant mild to moderate diffuse spinal stenosis at C3-4 through C6-7. Associated moderate to severe bilateral C5 through C7 foraminal narrowing as above. Electronically Signed   By: Jeannine Boga M.D.   On: 07/03/2019 03:29   CT Abdomen Pelvis W Contrast  Result Date: 07/01/2019 CLINICAL DATA:  Cirrhosis EXAM: CT ABDOMEN AND PELVIS WITH CONTRAST TECHNIQUE: Multidetector CT imaging of the abdomen and pelvis was performed using the standard protocol following bolus administration of intravenous contrast. CONTRAST:  12mL OMNIPAQUE IOHEXOL 300 MG/ML  SOLN COMPARISON:  Ultrasound 06/28/2019, CT 06/27/2014 FINDINGS: Lower chest: Lung bases demonstrate streaky atelectasis or scarring within the bases. No acute consolidation or pleural effusion. Borderline to mild cardiomegaly. Hepatobiliary: Cirrhotic morphology of the liver. Subcentimeter hypodensities too small to further characterize. No calcified gallstone or biliary dilatation Pancreas: Unremarkable. No pancreatic ductal dilatation or surrounding inflammatory changes. Spleen: Normal in size without focal abnormality. Adrenals/Urinary Tract:  Adrenal glands are normal. Cortical scarring in the mid left kidney. Negative for hydronephrosis. The bladder is normal Stomach/Bowel: Stomach is within normal limits. Appendix appears normal. No evidence of bowel wall thickening, distention, or inflammatory changes. Diverticular disease of the left colon without acute  inflammatory change. Vascular/Lymphatic: Moderate aortic atherosclerosis without aneurysm. Subcentimeter retroperitoneal nodes. Cavernous transformation of the portal vein. Numerous varices and collateral vessels within the abdomen. Reproductive: Slightly enlarged prostate Other: Negative for free air or free fluid. Musculoskeletal: Chronic compression deformity of T12. IMPRESSION: 1. Liver cirrhosis with evidence for portal hypertension. 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 2. Left colon diverticular disease without acute inflammatory process. Electronically Signed   By: Donavan Foil M.D.   On: 07/01/2019 23:26   DG Chest Port 1 View  Result Date: 06/28/2019 CLINICAL DATA:  Increased confusion, cirrhosis EXAM: PORTABLE CHEST 1 VIEW COMPARISON:  None. FINDINGS: No consolidation or edema. No pleural effusion or pneumothorax. Cardiomediastinal contours are within normal limits for portable technique. There is calcified plaque along the aortic arch. IMPRESSION: No acute process in the chest. Electronically Signed   By: Macy Mis M.D.   On: 06/28/2019 15:49   [place] CORE BIOPSY (LYMPH NODES)  Result Date: 07/10/2019 INDICATION: Indeterminate right-sided neck mass, potentially associated with the right lobe of the thyroid with associated right cervical lymphadenopathy. Patient was admitted with concern for infection, however FNA biopsy of the dominant right-sided neck mass demonstrated atypia. As such, patient returns today for ultrasound-guided core needle biopsy of right-sided neck mass as well as ultrasound-guided core  needle biopsy of right cervical lymph node for tissue diagnostic purposes. EXAM: 1. ULTRASOUND-GUIDED RIGHT NECK MASS CORE NEEDLE BIOPSY 2. ULTRASOUND-GUIDED RIGHT CERVICAL LYMPH NODE CORE NEEDLE BIOPSY COMPARISON:  Neck CT-07/03/2019; ultrasound-guided right neck mass fine-needle aspiration-07/04/2019 MEDICATIONS: None ANESTHESIA/SEDATION: Moderate (conscious) sedation was employed during this procedure. Brittlyn Cloe total of Versed 0.5 mg and Fentanyl 25 mcg was administered intravenously. Moderate Sedation Time: 14 minutes. The patient's level of consciousness and vital signs were monitored continuously by radiology nursing throughout the procedure under my direct supervision. COMPLICATIONS: None immediate. TECHNIQUE: Informed written consent was obtained from the patient after Mikle Sternberg discussion of the risks, benefits and alternatives to treatment. Questions regarding the procedure were encouraged and answered. Initial ultrasound scanning demonstrated grossly unchanged size and appearance of the at least 3.2 x 3.0 cm right sided neck mass, potentially associated with the right lobe of the thyroid (image 4). Additionally, note is made of an approximately 1.3 x 1.0 cm right cervical lymph node likely correlating with the dominant cervical lymph node seen on preceding neck CT image 58, series 3. Multiple ultrasound images were saved procedural documentation purposes. The procedure was planned. Loney Domingo timeout was performed prior to the initiation of the procedure. The operative sites were was prepped and draped in the usual sterile fashion, and Laretta Pyatt sterile drape was applied covering the operative field. Juliocesar Blasius timeout was performed prior to the initiation of the procedure. Local anesthesia was provided with 1% lidocaine with epinephrine. Initially beginning with the dominant right cervical lymph node, an 18 gauge core needle biopsy device was utilized to obtain Carlie Solorzano 4 core needle biopsies under direct ultrasound guidance. Next, Braeley Buskey separate 18 gauge  core needle biopsy device was utilized to obtain 5 core needle biopsies of the dominant right neck mass. The samples were placed in saline and submitted separate to pathology. Superficial hemostasis was achieved with manual compression. Post procedure scan was negative for significant hematoma. Dressings were applied. The patient tolerated the procedure well without immediate postprocedural complication. IMPRESSION: 1. Technically successful ultrasound guided biopsy of dominant right cervical lymph node. 2. Technically successful ultrasound-guided biopsy of right-sided neck mass, potentially associated with the right  lobe of the thyroid. Electronically Signed   By: Sandi Mariscal M.D.   On: 07/10/2019 16:38   [place] FNA SOFT TISSUE  Result Date: 07/04/2019 INDICATION: 85 year old with Latrease Kunde right neck mass. Request for ultrasound-guided aspiration. EXAM: ULTRASOUND-GUIDED FINE NEEDLE ASPIRATION OF RIGHT NECK MASS MEDICATIONS: None. ANESTHESIA/SEDATION: None FLUOROSCOPY TIME:  None COMPLICATIONS: None immediate. PROCEDURE: Informed written consent was obtained from the patient after Tishawn Friedhoff thorough discussion of the procedural risks, benefits and alternatives. All questions were addressed. Duel Conrad timeout was performed prior to the initiation of the procedure. Right side of the neck was evaluated with ultrasound. Right neck lesion was identified. The lesion appeared to be mostly solid, therefore, many of the specimens were collected for cytology. Four fine-needle aspirations were performed with 25 gauge needles. Two fine-needle aspirations were obtained with 18 gauge needles. Bandage placed over the puncture site. FINDINGS: Heterogeneous hypoechoic lesion that appears to be inseparable from the thyroid tissue. This soft tissue causes lateral displacement of the right common carotid artery and right internal jugular vein. No discrete fluid collections were identified. No significant fluid could be aspirated from this right neck  lesion/mass. One of the 18 gauge fine-needle aspirations was collected for culture. IMPRESSION: Heterogeneous solid right neck mass. This lesion appears to be indistinguishable from the right thyroid tissue. No significant fluid could be aspirated from this right neck lesion. Ultrasound-guided fine-needle aspirations were obtained from the lesion. Electronically Signed   By: Markus Daft M.D.   On: 07/04/2019 17:42   [place] CORE BIOPSY (SOFT TISSUE)  Result Date: 07/10/2019 INDICATION: Indeterminate right-sided neck mass, potentially associated with the right lobe of the thyroid with associated right cervical lymphadenopathy. Patient was admitted with concern for infection, however FNA biopsy of the dominant right-sided neck mass demonstrated atypia. As such, patient returns today for ultrasound-guided core needle biopsy of right-sided neck mass as well as ultrasound-guided core needle biopsy of right cervical lymph node for tissue diagnostic purposes. EXAM: 1. ULTRASOUND-GUIDED RIGHT NECK MASS CORE NEEDLE BIOPSY 2. ULTRASOUND-GUIDED RIGHT CERVICAL LYMPH NODE CORE NEEDLE BIOPSY COMPARISON:  Neck CT-07/03/2019; ultrasound-guided right neck mass fine-needle aspiration-07/04/2019 MEDICATIONS: None ANESTHESIA/SEDATION: Moderate (conscious) sedation was employed during this procedure. Ericha Whittingham total of Versed 0.5 mg and Fentanyl 25 mcg was administered intravenously. Moderate Sedation Time: 14 minutes. The patient's level of consciousness and vital signs were monitored continuously by radiology nursing throughout the procedure under my direct supervision. COMPLICATIONS: None immediate. TECHNIQUE: Informed written consent was obtained from the patient after Maie Kesinger discussion of the risks, benefits and alternatives to treatment. Questions regarding the procedure were encouraged and answered. Initial ultrasound scanning demonstrated grossly unchanged size and appearance of the at least 3.2 x 3.0 cm right sided neck mass, potentially  associated with the right lobe of the thyroid (image 4). Additionally, note is made of an approximately 1.3 x 1.0 cm right cervical lymph node likely correlating with the dominant cervical lymph node seen on preceding neck CT image 58, series 3. Multiple ultrasound images were saved procedural documentation purposes. The procedure was planned. Chevis Weisensel timeout was performed prior to the initiation of the procedure. The operative sites were was prepped and draped in the usual sterile fashion, and Kellin Fifer sterile drape was applied covering the operative field. Keiasia Christianson timeout was performed prior to the initiation of the procedure. Local anesthesia was provided with 1% lidocaine with epinephrine. Initially beginning with the dominant right cervical lymph node, an 18 gauge core needle biopsy device was utilized to obtain Gila Lauf 4 core needle biopsies  under direct ultrasound guidance. Next, Chancy Smigiel separate 18 gauge core needle biopsy device was utilized to obtain 5 core needle biopsies of the dominant right neck mass. The samples were placed in saline and submitted separate to pathology. Superficial hemostasis was achieved with manual compression. Post procedure scan was negative for significant hematoma. Dressings were applied. The patient tolerated the procedure well without immediate postprocedural complication. IMPRESSION: 1. Technically successful ultrasound guided biopsy of dominant right cervical lymph node. 2. Technically successful ultrasound-guided biopsy of right-sided neck mass, potentially associated with the right lobe of the thyroid. Electronically Signed   By: Sandi Mariscal M.D.   On: 07/10/2019 16:38   VAS [place] UPPER EXTREMITY VENOUS DUPLEX  Result Date: 07/13/2019 UPPER VENOUS STUDY  Indications: Swelling Risk Factors: None identified. Limitations: Poor ultrasound/tissue interface. Comparison Study: No prior studies. Performing Technologist: Oliver Hum RVT  Examination Guidelines: Warrene Kapfer complete evaluation includes B-mode imaging,  spectral Doppler, color Doppler, and power Doppler as needed of all accessible portions of each vessel. Bilateral testing is considered an integral part of Aaren Atallah complete examination. Limited examinations for reoccurring indications may be performed as noted.  Right Findings: +----------+------------+---------+-----------+----------+-------+ RIGHT     CompressiblePhasicitySpontaneousPropertiesSummary +----------+------------+---------+-----------+----------+-------+ IJV           Full       Yes       Yes                      +----------+------------+---------+-----------+----------+-------+ Subclavian    Full       Yes       Yes                      +----------+------------+---------+-----------+----------+-------+ Axillary      Full       Yes       Yes                      +----------+------------+---------+-----------+----------+-------+ Brachial      Full       Yes       Yes                      +----------+------------+---------+-----------+----------+-------+ Radial        Full                                          +----------+------------+---------+-----------+----------+-------+ Ulnar         Full                                          +----------+------------+---------+-----------+----------+-------+ Cephalic      Full                                          +----------+------------+---------+-----------+----------+-------+ Basilic       Full                                          +----------+------------+---------+-----------+----------+-------+  Left Findings: +----------+------------+---------+-----------+----------+-------+ LEFT      CompressiblePhasicitySpontaneousPropertiesSummary +----------+------------+---------+-----------+----------+-------+ Subclavian    Full  Yes       Yes                      +----------+------------+---------+-----------+----------+-------+  Summary:  Right: No evidence of deep vein  thrombosis in the upper extremity. No evidence of superficial vein thrombosis in the upper extremity.  Left: No evidence of thrombosis in the subclavian.  *See table(s) above for measurements and observations.  Diagnosing physician: Monica Martinez MD Electronically signed by Monica Martinez MD on 07/13/2019 at 8:10:02 PM.    Final    CT MAXILLOFACIAL WO CONTRAST  Result Date: 07/02/2019 CLINICAL DATA:  85 year old male. Sepsis. Sinus congestion. EXAM: CT MAXILLOFACIAL WITHOUT CONTRAST TECHNIQUE: Multidetector CT imaging of the maxillofacial structures was performed. Multiplanar CT image reconstructions were also generated. COMPARISON:  Head CT 06/28/2019, brain MRI 08/11/2011. FINDINGS: Osseous: The visible cervical vertebrae appear intact (through C5) but there is abnormal prevertebral soft tissue swelling and retropharyngeal or prevertebral fluid/edema (series 3, image 16). This seems to progress caudally in the neck, and becomes eccentric to the right at the level of the thyroid cartilage on series 3, image 87. Mandible intact. No acute dental abnormality identified. Maxilla, zygoma, nasal bones, central skull base and visible calvarium appear intact. Orbits: Intact orbital walls. Postoperative changes to both globes, otherwise negative noncontrast orbits soft tissues. Sinuses: Posterior left ethmoid and left sphenoid sinus opacification with periosteal thickening. But no complicating features are identified, and the remaining paranasal sinuses are well pneumatized. Tympanic cavities and mastoids are clear. Soft tissues: Abnormal prevertebral/retropharyngeal fluid or edema tracking inferiorly in the neck as stated above. There is also Chevis Weisensel partially retropharyngeal course of the right ICA. There are reactive appearing right level 3 lymph nodes on series 3, image 85. But elsewhere the noncontrast deep soft tissue spaces of the face and neck are within normal limits. Limited intracranial: Stable, No acute  intracranial abnormality. Calcified atherosclerosis at the skull base. IMPRESSION: 1. The dominant abnormality is prevertebral/retropharyngeal fluid or edema tracking which seems to increase towards the lower neck. Reactive appearing right level 3 lymph nodes. Visible cervical vertebrae appear intact, but in the setting of sepsis lower Cervical Discitis-Osteomyelitis is Doninique Lwin leading consideration. Retropharyngeal infection is in the differential. Cervical Spine MRI without and with contrast would be most valuable. Failing that Neck CT with IV contrast would be recommended. 2. Posterior left ethmoid and left sphenoid sinusitis, but has Dodi Leu chronic appearance with no complicating features. Electronically Signed   By: Genevie Ann M.D.   On: 07/02/2019 14:21   US Abdomen Limited RUQ  Result Date: 06/28/2019 CLINICAL DATA:  Altered mental status.  LFT elevation. EXAM: ULTRASOUND ABDOMEN LIMITED RIGHT UPPER QUADRANT COMPARISON:  None. FINDINGS: Gallbladder: No gallstones or wall thickening visualized. No sonographic Murphy sign noted by sonographer. Common bile duct: Diameter: The common bile duct was poorly evaluated. Liver: Diffuse increased echogenicity with slightly heterogeneous liver. Appearance typically secondary to fatty infiltration. Fibrosis secondary consideration. No secondary findings of cirrhosis noted. No focal hepatic lesion or intrahepatic biliary duct dilatation. The liver surface appears nodular. Portal vein is patent on color Doppler imaging with normal direction of blood flow towards the liver. Other: None. IMPRESSION: 1. No evidence for cholelithiasis. 2. The common bile duct was not visualized. 3. Cirrhosis. Electronically Signed   By: Constance Holster M.D.   On: 06/28/2019 21:00    Microbiology: No results found for this or any previous visit (from the past 240 hour(s)).   Labs: Basic Metabolic Panel: Recent  Labs  Lab 07/13/19 0416 07/14/19 0239 07/15/19 0236 07/16/19 0336 07/17/19 0122   NA 143 141 142 142 141  K 3.6 3.6 3.4* 3.8 4.5  CL 106 103 105 107 109  CO2 29 29 28 27 24   GLUCOSE 94 108* 96 88 88  BUN 10 10 7* 11 15  CREATININE 0.90 0.79 0.84 0.89 0.93  CALCIUM 8.5* 8.6* 8.3* 8.3* 8.2*  MG 1.5* 1.6* 1.7 1.8 1.8  PHOS 2.7 2.5 2.5 2.5 2.1*   Liver Function Tests: Recent Labs  Lab 07/13/19 0416 07/14/19 0239 07/15/19 0236 07/16/19 0336 07/17/19 0122  AST 72* 73* 68* 59* 78*  ALT 30 30 28 25 15   ALKPHOS 73 77 69 65 70  BILITOT 1.6* 1.9* 2.1* 1.9* 2.2*  PROT 5.9* 5.8* 6.0* 6.0* 5.6*  ALBUMIN 2.3* 2.3* 2.3* 2.3* 2.2*   No results for input(s): LIPASE, AMYLASE in the last 168 hours. Recent Labs  Lab 07/11/19 0345 07/14/19 1143  AMMONIA 30 44*   CBC: Recent Labs  Lab 07/13/19 0416 07/14/19 0239 07/15/19 0236 07/16/19 0336 07/17/19 0122  WBC 7.4 8.9 7.7 7.0 8.5  NEUTROABS 3.4 4.5 4.1 3.1 4.6  HGB 13.1 13.2 13.7 13.5 12.7*  HCT 38.0* 38.8* 39.8 40.7 38.4*  MCV 99.2 98.5 98.3 101.5* 100.3*  PLT 117* 120* 107* 111* 107*   Cardiac Enzymes: No results for input(s): CKTOTAL, CKMB, CKMBINDEX, TROPONINI in the last 168 hours. BNP: BNP (last 3 results) No results for input(s): BNP in the last 8760 hours.  ProBNP (last 3 results) No results for input(s): PROBNP in the last 8760 hours.  CBG: No results for input(s): GLUCAP in the last 168 hours.     Signed:  Fayrene Helper MD.  [facility] 07/17/2019, 2:49 PM

## 2019-07-18 ENCOUNTER — Inpatient Hospital Stay (HOSPITAL_COMMUNITY): Payer: Medicare Other | Admitting: Occupational Therapy

## 2019-07-18 ENCOUNTER — Inpatient Hospital Stay (HOSPITAL_COMMUNITY): Payer: Medicare Other | Admitting: Speech Pathology

## 2019-07-18 ENCOUNTER — Ambulatory Visit
Admit: 2019-07-18 | Discharge: 2019-07-18 | Disposition: A | Discharge status: Home / Self Care | Payer: Medicare Other | Attending: Radiation Oncology | Admitting: Radiation Oncology

## 2019-07-18 ENCOUNTER — Inpatient Hospital Stay (HOSPITAL_COMMUNITY): Payer: Medicare Other | Admitting: Physical Therapy

## 2019-07-18 DIAGNOSIS — C760 Malignant neoplasm of head, face and neck: Secondary | ICD-10-CM

## 2019-07-18 DIAGNOSIS — I5032 Chronic diastolic (congestive) heart failure: Secondary | ICD-10-CM

## 2019-07-18 LAB — CBC WITH DIFFERENTIAL/PLATELET
Abs Immature Granulocytes: 0.02 10*3/uL (ref 0.00–0.07)
Basophils Absolute: 0.1 10*3/uL (ref 0.0–0.1)
Basophils Relative: 1 %
Eosinophils Absolute: 0.5 10*3/uL (ref 0.0–0.5)
Eosinophils Relative: 6 %
HCT: 41.1 % (ref 39.0–52.0)
Hemoglobin: 13.6 g/dL (ref 13.0–17.0)
Immature Granulocytes: 0 %
Lymphocytes Relative: 35 %
Lymphs Abs: 2.5 10*3/uL (ref 0.7–4.0)
MCH: 33.3 pg (ref 26.0–34.0)
MCHC: 33.1 g/dL (ref 30.0–36.0)
MCV: 100.7 fL — ABNORMAL HIGH (ref 80.0–100.0)
Monocytes Absolute: 0.7 10*3/uL (ref 0.1–1.0)
Monocytes Relative: 10 %
Neutro Abs: 3.4 10*3/uL (ref 1.7–7.7)
Neutrophils Relative %: 48 %
Platelets: 116 10*3/uL — ABNORMAL LOW (ref 150–400)
RBC: 4.08 MIL/uL — ABNORMAL LOW (ref 4.22–5.81)
RDW: 16 % — ABNORMAL HIGH (ref 11.5–15.5)
WBC: 7.2 10*3/uL (ref 4.0–10.5)
nRBC: 0 % (ref 0.0–0.2)

## 2019-07-18 LAB — COMPREHENSIVE METABOLIC PANEL
ALT: 24 U/L (ref 0–44)
AST: 54 U/L — ABNORMAL HIGH (ref 15–41)
Albumin: 2.3 g/dL — ABNORMAL LOW (ref 3.5–5.0)
Alkaline Phosphatase: 68 U/L (ref 38–126)
Anion gap: 8 (ref 5–15)
BUN: 16 mg/dL (ref 8–23)
CO2: 27 mmol/L (ref 22–32)
Calcium: 8.4 mg/dL — ABNORMAL LOW (ref 8.9–10.3)
Chloride: 105 mmol/L (ref 98–111)
Creatinine, Ser: 0.9 mg/dL (ref 0.61–1.24)
GFR calc Af Amer: >60 mL/min (ref >60)
GFR calc non Af Amer: >60 mL/min (ref >60)
Glucose, Bld: 90 mg/dL (ref 70–99)
Potassium: 3.7 mmol/L (ref 3.5–5.1)
Sodium: 140 mmol/L (ref 135–145)
Total Bilirubin: 2.2 mg/dL — ABNORMAL HIGH (ref 0.3–1.2)
Total Protein: 6.1 g/dL — ABNORMAL LOW (ref 6.5–8.1)

## 2019-07-18 NOTE — Plan of Care (Signed)
  Problem: RH BOWEL ELIMINATION Goal: RH STG MANAGE BOWEL WITH ASSISTANCE Description: STG Manage Bowel independently  Outcome: Progressing   Problem: RH SAFETY Goal: RH STG ADHERE TO SAFETY PRECAUTIONS W/ASSISTANCE/DEVICE Description: STG Adhere to Safety Precautions With mod I assist and a walker  Outcome: Progressing Goal: RH STG DECREASED RISK OF FALL WITH ASSISTANCE Description: STG Decreased Risk of Fall With mod I assist Outcome: Progressing

## 2019-07-18 NOTE — Progress Notes (Signed)
Physical Therapy Session Note  Patient Details  Name: Glen Finley MRN: [id number] Date of Birth: 24-Aug-1934  Today's Date: 07/18/2019 PT Individual Time: 4628-6381 PT Individual Time Calculation (min): 26 min   Short Term Goals: Week 1:  PT Short Term Goal 1 (Week 1): STG = LTGs due to ELOS  Skilled Therapeutic Interventions/Progress Updates:   Pt received in w/c and agreeable to therapy, no c/o pain. Total assist w/c transport to/from therapy gym. Practiced negotiating stairs w/ 4 steps, B rails and gait over uneven surface x20', both w/ CGA. Performed Berg Balance Scale and scored 28/56 as detailed in flowsheets, explained significance of results to pt and wife. Reviewed results of evaluation performed earlier in AM w/ wife. Discussed ELOS, goals, and d/c recommendations. Wife verbalized understanding and in agreement. Pt ambulated back to room w/ CGA using RW x75'. Ended session in w/c, all needs in reach.   Therapy Documentation Precautions:  Precautions Precautions: Fall Restrictions Weight Bearing Restrictions: No  Therapy/Group: Individual Therapy  Tatijana Bierly Clent Demark 07/18/2019, 11:16 AM

## 2019-07-18 NOTE — Evaluation (Signed)
Occupational Therapy Assessment and Plan  Patient Details  Name: Glen Finley MRN: [id number] Date of Birth: [birth year]  OT Diagnosis: muscle weakness (generalized) Rehab Potential: Rehab Potential (ACUTE ONLY): Good ELOS: ~7-9 days   Today's Date: 07/18/2019 OT Individual Time: 1300-1400 OT Individual Time Calculation (min): 60 min     Problem List:  Patient Active Problem List   Diagnosis Date Noted  . Metastatic carcinoma ([place])   . 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 Retropharyngeal abscess 07/03/2019  . Liver cirrhosis ([place]) 07/02/2019  . Sepsis ([place]) 07/01/2019  . Acute hepatic encephalopathy 06/28/2019  . Encephalopathy 06/28/2019  . Transient alteration of awareness   . Sleep apnea 03/07/2015  . Headache(784.0)   . GERD (gastroesophageal reflux disease)   . Obesity   . REM sleep behavior disorder   . Other extrapyramidal disease and abnormal movement disorder   . Myasthenia gravis without exacerbation ([place])   . Abnormality of gait   . Unspecified psychosis   . Weakness   . Dysarthria 08/10/2011    Class: Acute  . Gait instability 08/10/2011    Class: Acute  . Hypertension 08/10/2011    Class: Chronic  . Hyperlipidemia 08/10/2011    Class: Chronic    Past Medical History:  Past Medical History:  Diagnosis Date  . Abnormality of gait   . GERD (gastroesophageal reflux disease)   . Headache(784.0)   . Hyperlipidemia   . Hypertension   . Migraine   . Myasthenia gravis without exacerbation ([place])   . Obesity   . Other extrapyramidal disease and abnormal movement disorder   . REM sleep behavior disorder   . Unspecified psychosis   . Weakness    Past Surgical History:  Past Surgical History:  Procedure Laterality Date  . CATARACT EXTRACTION Bilateral     Assessment & Plan Clinical Impression: Patient is a 85 y.o. 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 Krista Blue, obesity with BMI 29.43. Patient recently discharged 06/30/2019 after being treated for acute hepatic encephalopathy in the setting of lactulose noncompliance. Per chart review patient lives with spouse. 1 level home with ramped entrance. Reportedly independent but sedentary prior to admission. Presented 07/01/2019 with fever and altered mental status. Admission chemistries BUN 23, creatinine 1.12, total bilirubin 3.2, blood cultures no growth to date, lactic acid 2.4, hemoglobin 14.8, platelets 94,000, urinalysis negative nitrite, ammonia 92, SARS coronavirus negative. CT of abdomen pelvis showed liver cirrhosis with evidence for portal hypertension. 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 Left colon diverticular disease without acute inflammatory process. He was placed on broad-spectrum antibiotics. CT maxillofacial as well as MRIcervical spine soft tissue of the neck CT showed abnormal prevertebral retropharyngeal fluid or edema extending from C1-C2 level inferiorly to the C7 level. Extensive inflammatory stranding and phlegmonous change extending into the right neck soft tissue centered at the C5 level. Findings suspicious for associated 3.0 x 2.0 abscess along the right posterior aspect of the thyroid cartilage at the C5 level.Patient had ultrasound-guided FNA of right neck mass by interventional radiology 07/04/2019 culture rare GPC. Blood cultures negative so far. 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 Oncology and ENT Dr Marcellinorecommended repeat biopsy which was done 07/10/2019 pathology showed positive straining for TTF-1-1 raising the possibility of metastatic poorly differentiated lung non-small cell carcinoma.. Medical oncology  has been consulted current plan supportive care,  potential XRT. All antibiotics have since been discontinued.CT of the chest 07/12/2019 showed slight decrease in size of infiltrating soft tissue right neck mass. Small bilateral pleural effusions with dependent lower lobe atelectasis. 4.6 cm ascending thoracic aortic aneurysm with recommendations of semiannual imaging followed by CTA or MRA. In regards to patient's acute hepatic metabolic encephalopathy he continues on lactulose 30 mg 3 times daily his ammonia levels have improved to44. Patient is tolerating a regular diet.  Patient transferred to CIR on 07/17/2019 .    Patient currently requires min tomod A  with basic self-care skills and basic mobility secondary to muscle weakness, decreased cardiorespiratoy endurance, decreased attention, decreased awareness, decreased problem solving, decreased safety awareness and decreased memory and decreased standing balance and decreased balance strategies.  Prior to hospitalization, patient could complete ADL with supervision with multiple falls  Patient will benefit from skilled intervention to decrease level of assist with basic self-care skills and increase independence with basic self-care skills prior to discharge home with care partner.  Anticipate patient will require 24 hour supervision and follow up home health.  OT - End of Session Activity Tolerance: Tolerates 30+ min activity with multiple rests Endurance Deficit: Yes OT Assessment Rehab Potential (ACUTE ONLY): Good OT Patient demonstrates impairments in the following area(s): Balance;Cognition;Endurance;Motor;Safety OT Basic ADL's Functional Problem(s): Grooming;Bathing;Dressing;Toileting OT Transfers Functional Problem(s): Toilet;Tub/Shower OT Additional Impairment(s): None OT Plan OT Intensity: Minimum of 1-2 x/day, 45 to 90 minutes OT Frequency: 5 out of 7 days OT Duration/Estimated Length of Stay: ~7-9 days OT Treatment/Interventions: Balance/vestibular training;DME/adaptive  equipment instruction;Patient/family education;Therapeutic Activities;Cognitive remediation/compensation;Psychosocial support;Therapeutic Exercise;Community reintegration;Functional mobility training;Self Care/advanced ADL retraining;UE/LE Strength taining/ROM;Discharge planning;Neuromuscular re-education;Disease mangement/prevention;UE/LE Coordination activities;Pain management OT Self Feeding Anticipated Outcome(s): n/a OT Basic Self-Care Anticipated Outcome(s): supervision OT Toileting Anticipated Outcome(s): supervision OT Bathroom Transfers Anticipated Outcome(s): supervision OT Recommendation Recommendations for Other Services: Neuropsych consult Patient destination: Home Follow Up Recommendations: Home health OT Equipment Recommended: To be determined   Skilled Therapeutic Intervention OT eval initiated with OT purpose, role and goals discussed. Pt received in w/c. Pt and wife reports he uses either no AD or with a SPC at home and had multiple falls. Pt ambulated to the bathroom with min A without AD with a posterior lean and his left LE externally rotated. Pt had been incontinent of bowel but not aware. Pt ambulated into the shower stall with min A . Pt with uncontrolled descents when going from standing to sitting requiring min A to control from falling backwards- happened with most of his sit to stands when not cued to reach back with Ue to control self. Pt stood to shave at the sink without AD with min A. Pt able to dress self sit to stand with min A including socks and shoes. Left left resting in the bed.     OT Evaluation Precautions/Restrictions  Precautions Precautions: Fall Restrictions Weight Bearing Restrictions: No General Chart Reviewed: Yes Family/Caregiver Present: Yes(wife here at the beginining)  Pain Pain Assessment Pain Scale: 0-10 Pain Score: 0-No pain Home Living/Prior Functioning Home Living Family/patient expects to be discharged to:: Private  residence Living Arrangements: Spouse/significant other Available Help at Discharge: Family, Available 24 hours/day Type of Home: House Home Access: Stairs to enter, Ramped entrance Technical brewer of Steps: 3 Entrance Stairs-Rails: Right Home Layout: One level Bathroom Shower/Tub: Chiropodist: Standard Bathroom Accessibility: Yes  Lives With: Spouse Prior Function Level of Independence: Independent with gait, Independent with transfers,  Requires assistive device for independence  Able to Take Stairs?: Yes 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 Comments: decreased endurance at home; reports he mostly stays around the home and does not have to walk far, doesn't get out much ADL ADL Grooming: Setup, Minimal assistance Upper Body Bathing: Contact guard Where Assessed-Upper Body Bathing: Shower Lower Body Bathing: Moderate assistance Where Assessed-Lower Body Bathing: Shower Upper Body Dressing: Minimal assistance Where Assessed-Upper Body Dressing: Edge of bed Lower Body Dressing: Moderate assistance Where Assessed-Lower Body Dressing: Edge of bed Toileting: Other (Comment)(incontient) Where Assessed-Toileting: Glass blower/designer: Psychiatric nurse Method: Optometrist: Walk in Retail buyer: Minimal assistance Vision Baseline Vision/History: Wears glasses Wears Glasses: Reading only Patient Visual Report: No change from baseline Vision Assessment?: No apparent visual deficits Perception  Perception: Within Functional Limits Praxis Praxis: Intact Cognition Overall Cognitive Status: Impaired/Different from baseline Arousal/Alertness: Awake/alert Orientation Level: Person;Place;Situation Person: Oriented Place: Oriented Situation: Oriented Year: 2021 Month: April Day of Week: Correct Memory: Impaired Memory Impairment: Decreased recall of new  information;Decreased short term memory Decreased Short Term Memory: Verbal basic;Functional basic Immediate Memory Recall: Sock;Blue;Bed Memory Recall Sock: Not able to recall Memory Recall Blue: Not able to recall Memory Recall Bed: Not able to recall Attention: Sustained Sustained Attention: Appears intact Sustained Attention Impairment: Verbal basic;Functional basic Awareness: Impaired Awareness Impairment: Anticipatory impairment Problem Solving: Impaired Problem Solving Impairment: Functional basic;Verbal basic Safety/Judgment: Impaired Sensation Sensation Light Touch: Appears Intact Hot/Cold: Appears Intact Proprioception: Appears Intact Stereognosis: Not tested Coordination Gross Motor Movements are Fluid and Coordinated: No Fine Motor Movements are Fluid and Coordinated: Yes Motor  Motor Motor: Within Functional Limits Motor - Skilled Clinical Observations: generalized weakness Mobility  Bed Mobility Bed Mobility: Rolling Right;Supine to Sit;Sit to Supine;Rolling Left Rolling Right: Independent Rolling Left: Independent Supine to Sit: Supervision/Verbal cueing Sit to Supine: Supervision/Verbal cueing Transfers Sit to Stand: Contact Guard/Touching assist Stand to Sit: Minimal Assistance - Patient > 75%(uncontrolled decent)  Trunk/Postural Assessment  Cervical Assessment Cervical Assessment: (forward head) Thoracic Assessment Thoracic Assessment: (forward flexed) Lumbar Assessment Lumbar Assessment: (posterior pelvic tilt) Postural Control Postural Control: Deficits on evaluation(stong posterior lean; uncontrolled decents) Righting Reactions: delayed Protective Responses: delayed  Balance Balance Balance Assessed: Yes Standardized Balance Assessment Standardized Balance Assessment: Berg Balance Test Berg Balance Test Sit to Stand: Able to stand  independently using hands Standing Unsupported: Able to stand 2 minutes with supervision Sitting with Back  Unsupported but Feet Supported on Floor or Stool: Able to sit safely and securely 2 minutes Stand to Sit: Sits safely with minimal use of hands Transfers: Able to transfer safely, definite need of hands Standing Unsupported with Eyes Closed: Able to stand 10 seconds with supervision Standing Ubsupported with Feet Together: Able to place feet together independently but unable to hold for 30 seconds From Standing, Reach Forward with Outstretched Arm: Can reach forward >5 cm safely (2") From Standing Position, Pick up Object from Floor: Able to pick up shoe, needs supervision From Standing Position, Turn to Look Behind Over each Shoulder: Needs supervision when turning Turn 360 Degrees: Needs assistance while turning Standing Unsupported, Alternately Place Feet on Step/Stool: Needs assistance to keep from falling or unable to try Standing Unsupported, One Foot in Front: Loses balance while stepping or standing Standing on One Leg: Unable to try or needs assist to prevent fall Total Score: 28 Static Sitting Balance Static Sitting - Level of Assistance: 6: Modified independent (Device/Increase time) Dynamic Sitting Balance Dynamic  Sitting - Level of Assistance: 5: Stand by assistance Static Standing Balance Static Standing - Level of Assistance: 4: Min assist Dynamic Standing Balance Dynamic Standing - Level of Assistance: 4: Min assist;3: Mod assist(can fall backwards) Extremity/Trunk Assessment RUE Assessment RUE Assessment: Within Functional Limits LUE Assessment LUE Assessment: Within Functional Limits     Refer to Care Plan for Long Term Goals  Recommendations for other services: Neuropsych   Discharge Criteria: Patient will be discharged from OT if patient refuses treatment 3 consecutive times without medical reason, if treatment goals not met, if there is a change in medical status, if patient makes no progress towards goals or if patient is discharged from hospital.  The above  assessment, treatment plan, treatment alternatives and goals were discussed and mutually agreed upon: by patient  Nicoletta Ba 07/18/2019, 1:48 PM

## 2019-07-18 NOTE — Evaluation (Signed)
Speech Language Pathology Assessment and Plan  Patient Details  Name: Glen Finley MRN: [id number] Date of Birth: 03-29-1934  SLP Diagnosis: Dysarthria;Cognitive Impairments  Rehab Potential: Good ELOS: 5-7 days    Today's Date: 07/18/2019 SLP Individual Time: 9024-0973 SLP Individual Time Calculation (min): 55 min   Problem List:  Patient Active Problem List   Diagnosis Date Noted  . Metastatic carcinoma ([place])   . 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 Retropharyngeal abscess 07/03/2019  . Liver cirrhosis ([place]) 07/02/2019  . Sepsis ([place]) 07/01/2019  . Acute hepatic encephalopathy 06/28/2019  . Encephalopathy 06/28/2019  . Transient alteration of awareness   . Sleep apnea 03/07/2015  . Headache(784.0)   . GERD (gastroesophageal reflux disease)   . Obesity   . REM sleep behavior disorder   . Other extrapyramidal disease and abnormal movement disorder   . Myasthenia gravis without exacerbation ([place])   . Abnormality of gait   . Unspecified psychosis   . Weakness   . Dysarthria 08/10/2011    Class: Acute  . Gait instability 08/10/2011    Class: Acute  . Hypertension 08/10/2011    Class: Chronic  . Hyperlipidemia 08/10/2011    Class: Chronic   Past Medical History:  Past Medical History:  Diagnosis Date  . Abnormality of gait   . GERD (gastroesophageal reflux disease)   . Headache(784.0)   . Hyperlipidemia   . Hypertension   . Migraine   . Myasthenia gravis without exacerbation ([place])   . Obesity   . Other extrapyramidal disease and abnormal movement disorder   . REM sleep behavior disorder   . Unspecified psychosis   . Weakness    Past Surgical History:  Past Surgical History:  Procedure Laterality Date  . 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 Krista Blue, obesity with BMI 29.43. Patient recently discharged 06/30/2019 after being treated for acute hepatic encephalopathy in the setting of lactulose noncompliance. Per chart review patient lives with spouse. 1 level home with ramped entrance. Reportedly independent but sedentary prior to admission. Presented 07/01/2019 with fever and altered mental status. CT of abdomen pelvis showed liver cirrhosis with evidence for portal hypertension. 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 Left colon diverticular disease without acute inflammatory process. He was placed on broad-spectrum antibiotics. CT maxillofacial as well as MRIcervical spine soft tissue of the neck CT showed abnormal prevertebral retropharyngeal fluid or edema extending from C1-C2 level inferiorly to the C7 level. Extensive inflammatory stranding and phlegmonous change extending into the right neck soft tissue centered at the C5 level. Findings suspicious for associated 3.0 x 2.0 abscess along the right posterior aspect of the thyroid cartilage at the C5 level.Patient had ultrasound-guided FNA of right neck mass by interventional radiology 07/04/2019 culture rare GPC. Blood cultures negative so far. 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 Oncology and ENT recommended repeat biopsy which was done 07/10/2019 pathology showed positive straining for TTF-1-1 raising the possibility of metastatic poorly differentiated lung non-small cell carcinoma.. Medical oncology has been consulted current plan supportive care. All antibiotics have since been discontinued. In regards to patient's acute hepatic metabolic encephalopathy he continues on lactulose 30 mg 3 times daily his ammonia levels have improved to 30. Patient  is tolerating a regular diet. Therapy evaluations  completed and patient was recommended for a comprehensive rehab program. Patient admitted 07/17/19.  Patient was administered the Cognistat and demonstrates severe impairments in immediate recall and visual construction tasks and moderate impairments in short-term memory and calculations. Patient also demonstrates mild dysarthria due to imprecise consonants and xerostomia which impacts his overall speech intelligibility to ~90% at the sentence level.  Patient would benefit from skilled SLP intervention to maximize his cognitive functioning and speech intelligibility prior to discharge.    Skilled Therapeutic Interventions          Administered a cognitive-linguistic evaluation, please see above for details.   SLP Assessment  Patient will need skilled [facility] Services during CIR admission    Recommendations  Oral Care Recommendations: Oral care BID Recommendations for Other Services: Neuropsych consult Patient destination: Home Follow up Recommendations: Home Health SLP;24 hour supervision/assistance Equipment Recommended: None recommended by SLP    SLP Frequency 3 to 5 out of 7 days   SLP Duration  SLP Intensity  SLP Treatment/Interventions 5-7 days  Minumum of 1-2 x/day, 30 to 90 minutes  Cognitive remediation/compensation;Internal/external aids;Speech/Language facilitation;Therapeutic Activities;Environmental controls;Cueing hierarchy;Functional tasks;Patient/family education    Pain No/Denies Pain  SLP Evaluation Cognition Overall Cognitive Status: Impaired/Different from baseline Arousal/Alertness: Awake/alert Orientation Level: Oriented X4 Attention: Sustained Sustained Attention: Impaired Sustained Attention Impairment: Verbal basic;Functional basic Memory: Impaired Memory Impairment: Decreased recall of new information;Decreased short term memory Decreased Short Term Memory: Verbal basic;Functional basic Awareness: Appears intact Problem Solving:  Impaired Problem Solving Impairment: Functional basic;Verbal basic Safety/Judgment: Impaired  Comprehension Auditory Comprehension Overall Auditory Comprehension: Appears within functional limits for tasks assessed Visual Recognition/Discrimination Discrimination: Not tested Reading Comprehension Reading Status: Not tested Expression Expression Primary Mode of Expression: Verbal Verbal Expression Overall Verbal Expression: Appears within functional limits for tasks assessed Written Expression Written Expression: Not tested Oral Motor Oral Motor/Sensory Function Overall Oral Motor/Sensory Function: Within functional limits Motor Speech Overall Motor Speech: Impaired Respiration: Within functional limits Phonation: Normal Resonance: Within functional limits Articulation: Impaired Level of Impairment: Sentence Intelligibility: Intelligible Motor Planning: Witnin functional limits Effective Techniques: Slow rate;Over-articulate  Short Term Goals: Week 1: SLP Short Term Goal 1 (Week 1): STGs=LTGs due to ELOS  Refer to Care Plan for Long Term Goals  Recommendations for other services: Neuropsych  Discharge Criteria: Patient will be discharged from SLP if patient refuses treatment 3 consecutive times without medical reason, if treatment goals not met, if there is a change in medical status, if patient makes no progress towards goals or if patient is discharged from hospital.  The above assessment, treatment plan, treatment alternatives and goals were discussed and mutually agreed upon: by patient  Chester Romero 07/18/2019, 10:09 AM

## 2019-07-18 NOTE — Evaluation (Signed)
Physical Therapy Assessment and Plan  Patient Details  Name: Glen Finley MRN: [id number] Date of Birth: 1934-04-02  PT Diagnosis: Abnormal posture, Coordination disorder, Difficulty walking, Impaired cognition, Muscle weakness and Pain in R knee  Rehab Potential: Good ELOS: 7 days   Today's Date: 07/18/2019 PT Individual Time: 0800-0858 PT Individual Time Calculation (min): 58 min    Problem List:  Patient Active Problem List   Diagnosis Date Noted  . Metastatic carcinoma ([place])   . 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 Retropharyngeal abscess 07/03/2019  . Liver cirrhosis ([place]) 07/02/2019  . Sepsis ([place]) 07/01/2019  . Acute hepatic encephalopathy 06/28/2019  . Encephalopathy 06/28/2019  . Transient alteration of awareness   . Sleep apnea 03/07/2015  . Headache(784.0)   . GERD (gastroesophageal reflux disease)   . Obesity   . REM sleep behavior disorder   . Other extrapyramidal disease and abnormal movement disorder   . Myasthenia gravis without exacerbation ([place])   . Abnormality of gait   . Unspecified psychosis   . Weakness   . Dysarthria 08/10/2011    Class: Acute  . Gait instability 08/10/2011    Class: Acute  . Hypertension 08/10/2011    Class: Chronic  . Hyperlipidemia 08/10/2011    Class: Chronic    Past Medical History:  Past Medical History:  Diagnosis Date  . Abnormality of gait   . GERD (gastroesophageal reflux disease)   . Headache(784.0)   . Hyperlipidemia   . Hypertension   . Migraine   . Myasthenia gravis without exacerbation ([place])   . Obesity   . Other extrapyramidal disease and abnormal movement disorder   . REM sleep behavior disorder   . Unspecified psychosis   . Weakness    Past Surgical History:  Past Surgical History:  Procedure Laterality Date  . CATARACT EXTRACTION Bilateral     Assessment & Plan Clinical Impression: Glen Finley is an 85 year old right-handed male with history of hyperlipidemia, Parkinson's versus Lewy body  disease, bipolar disorder, diastolic congestive heart failure hypertension, myasthenia gravis/gait disorder on no medications without exacerbationfollowed by neurology Dr. Krista Blue, obesity with BMI 29.43. Patient recently discharged 06/30/2019 after being treated for acute hepatic encephalopathy in the setting of lactulose noncompliance. Per chart review patient lives with spouse. 1 level home with ramped entrance. Reportedly independent but sedentary prior to admission. Presented 07/01/2019 with fever and altered mental status. Admission chemistries BUN 23, creatinine 1.12, total bilirubin 3.2, blood cultures no growth to date, lactic acid 2.4, hemoglobin 14.8, platelets 94,000, urinalysis negative nitrite, ammonia 92, SARS coronavirus negative. CT of abdomen pelvis showed liver cirrhosis with evidence for portal hypertension. 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 Left colon diverticular disease without acute inflammatory process. He was placed on broad-spectrum antibiotics. CT maxillofacial as well as MRIcervical spine soft tissue of the neck CT showed abnormal prevertebral retropharyngeal fluid or edema extending from C1-C2 level inferiorly to the C7 level. Extensive inflammatory stranding and phlegmonous change extending into the right neck soft tissue centered at the C5 level. Findings suspicious for associated 3.0 x 2.0 abscess along the right posterior aspect of the thyroid cartilage at the C5 level.Patient had ultrasound-guided FNA of right neck mass by interventional radiology 07/04/2019 culture rare GPC. Blood cultures negative so far. 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 Medical oncology has been  consulted current plan supportive care, potential XRT. All antibiotics have since been discontinued.CT of the chest 07/12/2019 showed slight decrease in size of infiltrating soft tissue right neck mass. Small bilateral pleural effusions with dependent lower lobe atelectasis. 4.6 cm ascending thoracic aortic aneurysm with recommendations of semiannual imaging followed by CTA or MRA. In regards to patient's acute hepatic metabolic encephalopathy he continues on lactulose 30 mg 3 times daily his ammonia levels have improved to44. Patient is tolerating a regular diet. Therapy evaluations completed and patient was admitted for a comprehensive rehab program. Patient transferred to CIR on 07/17/2019 .   Patient currently requires S for bed mobility, min guard for functional transfers and gait but at times does need MinA for problem solving/safety due to cognition  with mobility secondary to muscle weakness and muscle joint tightness, decreased cardiorespiratoy endurance, decreased problem solving, decreased safety awareness and decreased memory and decreased standing balance and decreased balance strategies.  Prior to hospitalization, patient was modified independent  with mobility and lived with Spouse in a House home.  Home access is 3Stairs to enter, Ramped entrance.  Patient will benefit from skilled PT intervention to maximize safe functional mobility, minimize fall risk and decrease caregiver burden for planned discharge home with 24 hour supervision.  Anticipate patient will benefit from follow up Kevin at discharge.  PT - End of Session Activity Tolerance: Decreased this session Endurance Deficit: Yes PT Assessment Rehab Potential (ACUTE/IP ONLY): Good PT Barriers to Discharge: Other (comments) PT Barriers to Discharge Comments: cognition- will need 24/7S for safety PT Patient demonstrates impairments in the following area(s): Balance;Endurance;Safety PT Transfers Functional Problem(s): Bed  Mobility;Bed to Chair;Car;Furniture PT Locomotion Functional Problem(s): Ambulation;Stairs PT Plan PT Intensity: Minimum of 1-2 x/day ,45 to 90 minutes PT Frequency: 5 out of 7 days PT Duration Estimated Length of Stay: 7 days PT Treatment/Interventions: Ambulation/gait training;Discharge planning;Functional mobility training;Psychosocial support;Therapeutic Activities;Visual/perceptual remediation/compensation;Balance/vestibular training;Disease management/prevention;Neuromuscular re-education;Skin care/wound management;Therapeutic Exercise;Wheelchair propulsion/positioning;Cognitive remediation/compensation;DME/adaptive equipment instruction;Pain management;Splinting/orthotics;UE/LE Strength taining/ROM;Functional electrical stimulation;Community reintegration;Patient/family education;Stair training;UE/LE Coordination activities PT Transfers Anticipated Outcome(s): S, LRAD PT Locomotion Anticipated Outcome(s): S, LRAD PT Recommendation Recommendations for Other Services: Therapeutic Recreation consult Therapeutic Recreation Interventions: Outing/community reintergration;Kitchen group;Stress management Follow Up Recommendations: Home health PT;24 hour supervision/assistance Patient destination: Home Equipment Recommended: Rolling walker with 5" wheels;3 in 1 bedside comode  Skilled Therapeutic Intervention Patient received in bed, very pleasant and willing to participate in therapy today. Able to complete all bed mobility with independence to S, functional transfers with min guard/RW, and gait approximately 213f with RW/min guard but quite fatigued afterwards. Did need as much as Min assist for car transfer due to reduced safety and problem solving abilities. Reports concern about chronic R knee pain however did well with RW today. Education provided about CIR POC and expectations, therapies while on the unit, and importance of participation including being OOB, patient agreeable to all. Feel he  will do well with expected outcomes likely being supervision level for safety given cognitive deficits and reduced problem solving ability in functional scenarios.   PT Evaluation Precautions/Restrictions Precautions Precautions: Fall Restrictions Weight Bearing Restrictions: No General Chart Reviewed: Yes Family/Caregiver Present: No Vital Signs Pain Pain Assessment Pain Scale: 0-10 Pain Score: 0-No pain Home Living/Prior Functioning Home Living Available Help at Discharge: Family;Available 24 hours/day Type of Home: House Home Access: Stairs to enter;Ramped entrance Entrance Stairs-Number of Steps: 3 Entrance Stairs-Rails: Right Home Layout: One level Bathroom Shower/Tub: TChiropodist  Standard Bathroom Accessibility: Yes  Lives With: Spouse Prior Function Level of Independence: Independent with gait;Independent with transfers;Requires assistive device for independence  Able to Take Stairs?: Yes 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 Comments: decreased endurance at home; reports he mostly stays around the home and does not have to walk far, doesn't get out much Vision/Perception  Perception Perception: Within Functional Limits Praxis Praxis: Intact  Cognition Overall Cognitive Status: Impaired/Different from baseline Arousal/Alertness: Awake/alert Orientation Level: Oriented X4 Attention: Sustained Sustained Attention: Impaired Sustained Attention Impairment: Verbal basic;Functional basic Memory: Impaired Memory Impairment: Decreased recall of new information;Decreased short term memory Decreased Short Term Memory: Verbal basic;Functional basic Awareness: Appears intact Problem Solving: Impaired Problem Solving Impairment: Functional basic;Verbal basic Safety/Judgment: Impaired Sensation Sensation Light Touch: Appears Intact Hot/Cold: Not tested Proprioception: Appears Intact Stereognosis: Not  tested Coordination Gross Motor Movements are Fluid and Coordinated: Yes Fine Motor Movements are Fluid and Coordinated: Yes Motor  Motor Motor: Within Functional Limits Motor - Skilled Clinical Observations: no significant motor impairments or defiicts  Mobility Bed Mobility Bed Mobility: Rolling Right;Supine to Sit;Sit to Supine;Rolling Left Rolling Right: Independent Rolling Left: Independent Supine to Sit: Supervision/Verbal cueing Sit to Supine: Supervision/Verbal cueing Transfers Transfers: Stand Pivot Transfers;Sit to Stand;Stand to Sit Sit to Stand: Contact Guard/Touching assist Stand to Sit: Contact Guard/Touching assist Stand Pivot Transfers: Contact Guard/Touching assist Transfer (Assistive device): Rolling walker Locomotion  Gait Ambulation: Yes Gait Assistance: Contact Guard/Touching assist Gait Distance (Feet): 250 Feet Assistive device: Rolling walker Gait Gait: Yes Gait Pattern: Impaired Gait Pattern: Step-through pattern;Decreased step length - right;Decreased step length - left;Right flexed knee in stance;Left flexed knee in stance;Trunk flexed;Decreased trunk rotation Stairs / Additional Locomotion Stairs: No Wheelchair Mobility Wheelchair Mobility: No  Trunk/Postural Assessment  Cervical Assessment Cervical Assessment: Exceptions to WFL(forward head) Thoracic Assessment Thoracic Assessment: Exceptions to WFL(kyphotic) Lumbar Assessment Lumbar Assessment: Exceptions to WFL(increased lordosis) Postural Control Postural Control: Within Functional Limits  Balance Balance Balance Assessed: Yes Standardized Balance Assessment Standardized Balance Assessment: Berg Balance Test Berg Balance Test Sit to Stand: Able to stand  independently using hands Standing Unsupported: Able to stand 2 minutes with supervision Sitting with Back Unsupported but Feet Supported on Floor or Stool: Able to sit safely and securely 2 minutes Stand to Sit: Sits safely with  minimal use of hands Transfers: Able to transfer safely, definite need of hands Standing Unsupported with Eyes Closed: Able to stand 10 seconds with supervision Standing Ubsupported with Feet Together: Able to place feet together independently but unable to hold for 30 seconds From Standing, Reach Forward with Outstretched Arm: Can reach forward >5 cm safely (2") From Standing Position, Pick up Object from Floor: Able to pick up shoe, needs supervision From Standing Position, Turn to Look Behind Over each Shoulder: Needs supervision when turning Turn 360 Degrees: Needs assistance while turning Standing Unsupported, Alternately Place Feet on Step/Stool: Needs assistance to keep from falling or unable to try Standing Unsupported, One Foot in Front: Loses balance while stepping or standing Standing on One Leg: Unable to try or needs assist to prevent fall Total Score: 28 Static Sitting Balance Static Sitting - Level of Assistance: 6: Modified independent (Device/Increase time) Dynamic Sitting Balance Dynamic Sitting - Level of Assistance: 5: Stand by assistance Static Standing Balance Static Standing - Level of Assistance: 5: Stand by assistance Dynamic Standing Balance Dynamic Standing - Level of Assistance: 4: Min assist Extremity Assessment  RUE Assessment RUE Assessment: Not tested LUE Assessment LUE Assessment:  Not tested RLE Assessment RLE Assessment: Exceptions to [facility] Active Range of Motion (AROM) Comments: functionally WNL, hx of R knee pain slightly limiting R knee ROM General Strength Comments: ankle dorsiflexors 4+/5, quad 4+/5, hip flexor 4-/5, sitting hip ABD 4/5 LLE Assessment LLE Assessment: Exceptions to [facility] Active Range of Motion (AROM) Comments: functionally WNL General Strength Comments: ankle dorsiflexors 4+/5, quad 4+/5, hip flexor 4-/5, sitting hip ABD 4/5    Refer to Care Plan for Long Term Goals  Recommendations for other services: Wellsite geologist group, Stress management and Outing/community reintegration  Discharge Criteria: Patient will be discharged from PT if patient refuses treatment 3 consecutive times without medical reason, if treatment goals not met, if there is a change in medical status, if patient makes no progress towards goals or if patient is discharged from hospital.  The above assessment, treatment plan, treatment alternatives and goals were discussed and mutually agreed upon: by patient  Windell Norfolk, DPT, PN1   Supplemental Physical Therapist [place]    Pager [phone] Acute Rehab Office [phone]

## 2019-07-18 NOTE — Consult Note (Signed)
Radiation Oncology         [phone] ________________________________  Name: Darreld Hoffer Defibaugh        MRN: [id number]  Date of Service: 07/18/19   DOB: February 28, 1935  WG:NFAOZ, Jenny Reichmann, MD    REFERRING PHYSICIAN: Dr. Irene Limbo  DIAGNOSIS: The encounter diagnosis was Metastatic carcinoma ([place]).   HISTORY OF PRESENT ILLNESS: Audley Hinojos Pegues is a 85 y.o. male seen at the request of Dr. Irene Limbo for a newly noted carcinoma in the neck. The patient has multiple comorbidities with myasthenia, liver disease with recent encephalopathy, and parkinson's disease vs. Lewy body dementia and bipolar disorder. He recently was hospitalized with encephalopathy from his liver disease. He was admitted on 07/01/19 after recent admission for hepatic encephalopathy between 06/28/19 and 06/30/19. His current admission was for fever and progressive confusion, and he was found to be septic. Further work up after identifying concerns for sinus congestion included maxillofacial CT and MRI  of the cervical spine. This revealed prevertebral/retropharyngeal fluid or edema tracking which seems to increase towards the lower neck. Reactive appearing right level 3 lymph nodes, but no evidence of osteomyelitis was noted. Further CT of the neck with contrast on 07/03/19 revealed extensive inflammatory stranding and phlegmonous change in the right neck soft tissue  At the level of C5, but an associated 3 x 2 cm abscess was noted along the right posterior aspect of the thyroid cartilage at the level of C5. An FNA of the right neck mass on 07/04/19 revealed atypia and core biopsy was performed on 07/10/19 of the right neck mass, and a cervical node on the right. This revealed metastatic poorly differentiated carcinoma and though the profile of IHC was nonspecific, the staing pattern could be consistent with non small cell lung cancer or thyroid cancer. Additional imaging of the chest, abdomen and pelvis showed some small bilateral pleural effusions with  dependent lower lobe atelectasis, nonspecific borderline enlarged mediastinal nodes, and stigmata of cirrhosis, a thoracic aortic aneurysm, aortic atherosclerotic disease. He is not a candidate for systemic therapy, and is not a candidate for surgical resection of this abnormality, and we're asked to consider palliative radiotherapy.   PREVIOUS RADIATION THERAPY: No   PAST MEDICAL HISTORY:  Past Medical History:  Diagnosis Date  . Abnormality of gait   . GERD (gastroesophageal reflux disease)   . Headache(784.0)   . Hyperlipidemia   . Hypertension   . Migraine   . Myasthenia gravis without exacerbation ([place])   . Obesity   . Other extrapyramidal disease and abnormal movement disorder   . REM sleep behavior disorder   . Unspecified psychosis   . Weakness        PAST SURGICAL HISTORY: Past Surgical History:  Procedure Laterality Date  . CATARACT EXTRACTION Bilateral      FAMILY HISTORY: No family history on file.   SOCIAL HISTORY:  reports that he has never smoked. He has never used smokeless tobacco. He reports that he does not drink alcohol or use drugs. The patient is married and lives in [place].    ALLERGIES: Amlodipine, Cherry, Onion, and Codeine   MEDICATIONS:  Current Facility-Administered Medications  Medication Dose Route Frequency Provider Last Rate Last Admin  . amitriptyline (ELAVIL) tablet 25 mg  25 mg Oral QHS Cathlyn Parsons, PA-C   25 mg at 07/17/19 2224  . atenolol (TENORMIN) tablet 25 mg  25 mg Oral q AM AngiulliLavon Paganini, PA-C   25 mg at 07/18/19 0746  . escitalopram (LEXAPRO)  tablet 10 mg  10 mg Oral q AM AngiulliLavon Paganini, PA-C   10 mg at 07/18/19 0746  . furosemide (LASIX) tablet 20 mg  20 mg Oral Daily Cathlyn Parsons, PA-C   20 mg at 07/18/19 0746  . hydrALAZINE (APRESOLINE) tablet 50 mg  50 mg Oral Q8H AngiulliLavon Paganini, PA-C   50 mg at 07/18/19 1534  . lactulose (CHRONULAC) 10 GM/15ML solution 30 g  30 g Oral TID Cathlyn Parsons,  PA-C   30 g at 07/18/19 1534  . lisinopril (ZESTRIL) tablet 20 mg  20 mg Oral BID Cathlyn Parsons, PA-C   20 mg at 07/18/19 0746  . sorbitol 70 % solution 30 mL  30 mL Oral Daily PRN Angiulli, Lavon Paganini, PA-C         REVIEW OF SYSTEMS: On review of systems, the patient's wife states that he is doing pretty well and reports that he is progressing with the help of PT and in the rehabilitation setting. He is more alert and less confused during this hospitalization. He is not having any problems currently or prior to coming into the hospital with pain, difficulty swallowing, or difficulty with breathing. No other complains are verbalized by the patient's wife.    PHYSICAL EXAM:  Wt Readings from Last 3 Encounters:  07/18/19 184 lb 8.4 oz (83.7 kg)  07/17/19 192 lb 7.4 oz (87.3 kg)  06/30/19 194 lb 3.6 oz (88.1 kg)   Temp Readings from Last 3 Encounters:  07/18/19 98.4 F (36.9 C) (Oral)  07/17/19 98.2 F (36.8 C) (Oral)  06/30/19 (!) 97.5 F (36.4 C) (Oral)   BP Readings from Last 3 Encounters:  07/18/19 (!) 114/52  07/17/19 (!) 120/51  06/30/19 (!) 134/55   Pulse Readings from Last 3 Encounters:  07/18/19 (!) 59  07/17/19 (!) 57  06/30/19 70   Pain Assessment Pain Score: 0-No pain/10  Unable to assess due to encounter type.  ECOG = 1  0 - Asymptomatic (Fully active, able to carry on all predisease activities without restriction)  1 - Symptomatic but completely ambulatory (Restricted in physically strenuous activity but ambulatory and able to carry out work of a light or sedentary nature. For example, light housework, office work)  2 - Symptomatic, <50% in bed during the day (Ambulatory and capable of all self care but unable to carry out any work activities. Up and about more than 50% of waking hours)  3 - Symptomatic, >50% in bed, but not bedbound (Capable of only limited self-care, confined to bed or chair 50% or more of waking hours)  4 - Bedbound (Completely  disabled. Cannot carry on any self-care. Totally confined to bed or chair)  5 - Death   Eustace Pen MM, Creech RH, Tormey DC, et al. [phone]). "Toxicity and response criteria of the [facility]". [place] Oncol. 5 (6): 649-55    LABORATORY DATA:  Lab Results  Component Value Date   WBC 7.2 07/18/2019   HGB 13.6 07/18/2019   HCT 41.1 07/18/2019   MCV 100.7 (H) 07/18/2019   PLT 116 (L) 07/18/2019   Lab Results  Component Value Date   NA 140 07/18/2019   K 3.7 07/18/2019   CL 105 07/18/2019   CO2 27 07/18/2019   Lab Results  Component Value Date   ALT 24 07/18/2019   AST 54 (H) 07/18/2019   ALKPHOS 68 07/18/2019   BILITOT 2.2 (H) 07/18/2019      RADIOGRAPHY:  DG Chest 2 View  Result Date: 07/01/2019 CLINICAL DATA:  Altered mental status with fever EXAM: CHEST - 2 VIEW COMPARISON:  06/28/2019, CT chest 09/28/2011 FINDINGS: Mild cardiomegaly. No focal consolidation or pleural effusion. Aortic atherosclerosis. No pneumothorax IMPRESSION: 1. No acute focal airspace disease 2. Mild cardiomegaly Electronically Signed   By: Donavan Foil M.D.   On: 07/01/2019 21:29   CT Head Wo Contrast  Result Date: 06/28/2019 CLINICAL DATA:  Confusion. Increased confusion over the past 2 days. EXAM: CT HEAD WITHOUT CONTRAST TECHNIQUE: Contiguous axial images were obtained from the base of the skull through the vertex without intravenous contrast. COMPARISON:  Head CT and brain MRI May 2013 FINDINGS: Brain: No intracranial hemorrhage, mass effect, or midline shift. Age related atrophy. No hydrocephalus. The basilar cisterns are patent. Moderate to advanced chronic small vessel ischemia. No evidence of territorial infarct or acute ischemia. No extra-axial or intracranial fluid collection. Vascular: Atherosclerosis of skullbase vasculature without hyperdense vessel or abnormal calcification. Skull: No fracture or focal lesion. Sinuses/Orbits: Complete opacification of left side of sphenoid  sinus with high-density material. Opacification of posterior left ethmoid air cells. There is some surrounding bony sclerosis. Mild mucosal thickening of right side of sphenoid sinus. Mastoid air cells are clear. Bilateral cataract resection. Other: None. IMPRESSION: 1. No acute intracranial abnormality. 2. Age related atrophy. Moderate to advanced chronic small vessel ischemia. 3. Opacification of left sphenoid sinus and posterior ethmoid air cells with high density material. There is adjacent bony sclerosis suggesting this is chronic sinusitis, however was not seen on 2013 exam. Electronically Signed   By: Keith Rake M.D.   On: 06/28/2019 17:47   CT SOFT TISSUE NECK W CONTRAST  Result Date: 07/03/2019 CLINICAL DATA:  Provided INDICATION: Retropharyngeal/prevertebral edema, abscess, sepsis. EXAM: CT NECK WITH CONTRAST TECHNIQUE: Multidetector CT imaging of the neck was performed using the standard protocol following the bolus administration of intravenous contrast. CONTRAST:  46mL OMNIPAQUE IOHEXOL 300 MG/ML  SOLN COMPARISON:  Cervical spine MRI 07/02/2019, maxillofacial CT 07/02/2019. FINDINGS: Pharynx and larynx: Streak artifact from dental restoration limits evaluation of the oral cavity. There is no appreciable discrete mass or swelling within the oral cavity or nasopharynx. Again demonstrated is abnormal prevertebral/retropharyngeal fluid or edema extending from the C1-C2 level inferiorly to the C7 level. Centered at the C5-C6 level there is prominent inflammatory stranding and phlegmon extending into the right neck soft tissues measuring 4.0 x 4.6 x 7.5 cm (for instance as seen on series 10, image 88 and series 8, image 44). Within this region at the C5 level, there is a somewhat circumscribed region of hypoattenuation along the right posterior aspect of the thyroid cartilage measuring 3.0 x 2.0 cm in transaxial dimensions (series 10, image 74). Although no well-defined peripheral enhancement is  demonstrated, findings are suspicious for early abscess formation. There is associated leftward deviation of the laryngeal airway and subglottic trachea without significant airway narrowing. There is rightward displacement of the right internal jugular vein and common carotid artery. There is heterogeneous enhancement of the right thyroid lobe suggestive of thyroiditis. Salivary glands: Unremarkable. Thyroid: Heterogeneous appearance of the right thyroid lobe with extensive surrounding phlegmonous change/abscess as described below. There are few coarse calcifications within the left thyroid lobe. Lymph nodes: Right cervical chain lymphadenopathy is likely reactive. Most notably a right level III lymph node measures 13 mm in short axis (series 8, image 30). Vascular: The major vascular structures of the neck appear patent. The right internal jugular vein is significantly  narrowed at the level of the mid neck due to phlegmonous change/abscess. Limited intracranial: No abnormality identified. Visualized orbits: Excluded from the field of view. Mastoids and visualized paranasal sinuses: The paranasal sinuses are incompletely imaged. Redemonstrated complete opacification of the left sphenoid sinus with associated reactive osteitis. No significant mastoid effusion. Skeleton: No acute bony abnormality. Redemonstrated cervical spondylosis with multilevel disc space narrowing, posterior disc osteophytes as well as uncovertebral hypertrophy. Upper chest: No consolidation within the imaged lung apices. IMPRESSION: Redemonstrated abnormal prevertebral/retropharyngeal fluid or edema extending from the C1-C2 level inferiorly to the C7 level. Extensive inflammatory stranding and phlegmonous change extending into the right neck soft tissues centered at the C5 level. Findings suspicious for an associated 3.0 x 2.0 abscess along the right posterior aspect of the thyroid cartilage at the C5 level. Heterogeneous enhancement of the  right thyroid lobe suspicious for secondary thyroiditis. Findings likely reflect sequela of an infectious process. Close clinical follow-up is recommended with repeat imaging as warranted to exclude alternative etiologies (i.e. Neoplasm). Right cervical lymphadenopathy, likely reactive. Chronic left sphenoid sinusitis. Electronically Signed   By: Kellie Simmering DO   On: 07/03/2019 14:35   CT CHEST W CONTRAST  Result Date: 07/12/2019 CLINICAL DATA:  Abnormal right neck mass, concern for malignancy EXAM: CT CHEST WITH CONTRAST TECHNIQUE: Multidetector CT imaging of the chest was performed during intravenous contrast administration. CONTRAST:  86mL OMNIPAQUE IOHEXOL 300 MG/ML  SOLN COMPARISON:  07/03/2019, 09/28/2011 FINDINGS: Cardiovascular: Heart is not enlarged. No pericardial effusion. There is extensive atherosclerosis of the thoracic aorta and coronary vessels. There is a 4.6 cm ascending thoracic aortic aneurysm. No dissection. Mediastinum/Nodes: Numerous subcentimeter lymph nodes are seen within the mediastinum. Largest lymph node in the right paratracheal region image 30 measures 9 mm in short axis and in the precarinal region image 58 measures 10 mm in short axis. The esophagus and trachea are unremarkable. The infiltrating soft tissue mass involving the right lobe thyroid is again seen, measuring approximately 3.5 x 3.5 cm on image 8, previously measuring approximately 3.9 x 4.0 cm at a similar location. There is continued retropharyngeal soft tissue edema. Lungs/Pleura: There are small bilateral pleural effusions, volume estimated less than 500 cc each. Minimal dependent atelectasis greatest in the left lower lobe. No airspace disease. No pneumothorax. Central airways are patent. Upper Abdomen: Shrunken nodular liver compatible cirrhosis. Extensive upper abdominal varices are seen. Borderline splenomegaly measuring 13 cm. Musculoskeletal: No acute displaced fractures. Chronic T12 compression deformity.  Reconstructed images demonstrate no additional findings. IMPRESSION: 1. Slight decrease in the size of the infiltrating soft tissue right neck mass. Continued involvement of the right lobe thyroid with retropharyngeal and prevertebral soft tissue edema. Please refer to recent biopsy results. 2. Small bilateral pleural effusions with dependent lower lobe atelectasis. 3. Nonspecific borderline enlarged mediastinal lymph nodes. 4. Cirrhosis, with evidence of portal venous hypertension. 5.  Aortic Atherosclerosis (ICD10-I70.0). 6. 4.6 cm ascending thoracic aortic aneurysm. Recommend semi-annual imaging followup by CTA or MRA and referral to cardiothoracic surgery if not already obtained. This recommendation follows 2010 ACCF/AHA/AATS/ACR/ASA/SCA/SCAI/SIR/STS/SVM Guidelines for the Diagnosis and Management of Patients With Thoracic Aortic Disease. Circulation. 2010; 121: L275-T700. Aortic aneurysm NOS (ICD10-I71.9) Electronically Signed   By: Randa Ngo M.D.   On: 07/12/2019 20:34   MR CERVICAL SPINE W WO CONTRAST  Result Date: 07/03/2019 CLINICAL DATA:  Initial evaluation for acute sepsis, unknown source, concern for discitis. EXAM: MRI CERVICAL SPINE WITHOUT AND WITH CONTRAST TECHNIQUE: Multiplanar and multiecho pulse sequences of the  cervical spine, to include the craniocervical junction and cervicothoracic junction, were obtained without and with intravenous contrast. CONTRAST:  85mL GADAVIST GADOBUTROL 1 MMOL/ML IV SOLN COMPARISON:  Prior maxillofacial CT from earlier the same day. FINDINGS: Alignment: Mild straightening of the normal cervical lordosis. No listhesis. Vertebrae: Vertebral body height maintained without evidence for acute or chronic fracture. Bone marrow signal intensity somewhat diffusely decreased on T1 weighted imaging, nonspecific, but most commonly related to anemia, smoking, or obesity. No discrete or worrisome osseous lesions. Mild reactive endplate changes present about the C4-5 through  C6-7 interspaces, degenerative in nature. No findings to suggest osteomyelitis discitis or septic arthritis. No abnormal marrow edema or enhancement. Cord: Signal intensity within the cervical spinal cord is normal. No abnormal enhancement. No epidural abscess or other collection. Posterior Fossa, vertebral arteries, paraspinal tissues: Age-related cerebral atrophy noted within the visualized brain. Craniocervical junction normal. There is abnormal prevertebral/retropharyngeal edema extending from the nasopharynx through the upper thoracic spine. Abnormal edema with phlegmonous change seen involving the right parapharyngeal space, most pronounced at the level of the thyroid cartilage at approximately level III/IV (series 9, image 29). Finding concerning for acute infection given provided history. There is suggestion of a superimposed abscess at this level measuring up to approximately 2 cm (series 12, image 24), incompletely visualized. No frank loculated retropharyngeal abscess or collection. Prominent right level II/III lymph node measures up to 12 mm, likely reactive (series 8, image 20). Normal flow void seen within the vertebral arteries bilaterally. Disc levels: C2-C3: Small central disc protrusion minimally indents the ventral thecal sac. No canal or foraminal stenosis. C3-C4: Moderate-sized central disc protrusion indents the ventral thecal sac, contacting and mildly flattening the ventral spinal cord. Resultant moderate spinal stenosis. Superimposed bilateral uncovertebral and facet hypertrophy. Resultant mild left greater than right C4 foraminal stenosis. C4-C5: Chronic intervertebral disc space narrowing with mild diffuse disc bulge. Right greater than left uncovertebral hypertrophy. Broad posterior disc osteophyte flattens and partially faces the ventral thecal sac, greater on the right. Resultant mild-to-moderate spinal stenosis with mild flattening of the right hemi cord. Severe right with moderate  left C5 foraminal stenosis. C5-C6: Diffuse disc bulge with bilateral uncovertebral hypertrophy. Broad posterior component effaces the ventral thecal sac and resultant moderate spinal stenosis. Severe right with moderate left C6 foraminal narrowing. C6-C7: Diffuse disc bulge with bilateral uncovertebral hypertrophy. Mild ligament flavum thickening. Resultant mild spinal stenosis without cord impingement. Moderate left worse than right C7 foraminal narrowing. C7-T1:  Negative interspace.  Mild facet hypertrophy.  No stenosis. Visualized upper thoracic spine demonstrates no significant finding. IMPRESSION: 1. Abnormal retropharyngeal/prevertebral edema with associated phlegmonous change within the lower right neck as above, concerning for acute infection. Probable superimposed abscess measuring up to approximately 2 cm, incompletely visualized on this exam. Further evaluation with dedicated soft tissue neck CT with contrast recommended for further evaluation. 2. No evidence for osteomyelitis discitis within the cervical spine. 3. Multilevel cervical spondylosis with resultant mild to moderate diffuse spinal stenosis at C3-4 through C6-7. Associated moderate to severe bilateral C5 through C7 foraminal narrowing as above. Electronically Signed   By: Jeannine Boga M.D.   On: 07/03/2019 03:29   CT Abdomen Pelvis W Contrast  Result Date: 07/01/2019 CLINICAL DATA:  Cirrhosis EXAM: CT ABDOMEN AND PELVIS WITH CONTRAST TECHNIQUE: Multidetector CT imaging of the abdomen and pelvis was performed using the standard protocol following bolus administration of intravenous contrast. CONTRAST:  117mL OMNIPAQUE IOHEXOL 300 MG/ML  SOLN COMPARISON:  Ultrasound 06/28/2019, CT 06/27/2014 FINDINGS:  Lower chest: Lung bases demonstrate streaky atelectasis or scarring within the bases. No acute consolidation or pleural effusion. Borderline to mild cardiomegaly. Hepatobiliary: Cirrhotic morphology of the liver. Subcentimeter  hypodensities too small to further characterize. No calcified gallstone or biliary dilatation Pancreas: Unremarkable. No pancreatic ductal dilatation or surrounding inflammatory changes. Spleen: Normal in size without focal abnormality. Adrenals/Urinary Tract: Adrenal glands are normal. Cortical scarring in the mid left kidney. Negative for hydronephrosis. The bladder is normal Stomach/Bowel: Stomach is within normal limits. Appendix appears normal. No evidence of bowel wall thickening, distention, or inflammatory changes. Diverticular disease of the left colon without acute inflammatory change. Vascular/Lymphatic: Moderate aortic atherosclerosis without aneurysm. Subcentimeter retroperitoneal nodes. Cavernous transformation of the portal vein. Numerous varices and collateral vessels within the abdomen. Reproductive: Slightly enlarged prostate Other: Negative for free air or free fluid. Musculoskeletal: Chronic compression deformity of T12. IMPRESSION: 1. Liver cirrhosis with evidence for portal hypertension. 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 2. Left colon diverticular disease without acute inflammatory process. Electronically Signed   By: Donavan Foil M.D.   On: 07/01/2019 23:26   DG Chest Port 1 View  Result Date: 06/28/2019 CLINICAL DATA:  Increased confusion, cirrhosis EXAM: PORTABLE CHEST 1 VIEW COMPARISON:  None. FINDINGS: No consolidation or edema. No pleural effusion or pneumothorax. Cardiomediastinal contours are within normal limits for portable technique. There is calcified plaque along the aortic arch. IMPRESSION: No acute process in the chest. Electronically Signed   By: Macy Mis M.D.   On: 06/28/2019 15:49   [place] CORE BIOPSY (LYMPH NODES)  Result Date: 07/10/2019 INDICATION: Indeterminate right-sided neck mass, potentially associated with the right lobe of the thyroid with associated right cervical lymphadenopathy.  Patient was admitted with concern for infection, however FNA biopsy of the dominant right-sided neck mass demonstrated atypia. As such, patient returns today for ultrasound-guided core needle biopsy of right-sided neck mass as well as ultrasound-guided core needle biopsy of right cervical lymph node for tissue diagnostic purposes. EXAM: 1. ULTRASOUND-GUIDED RIGHT NECK MASS CORE NEEDLE BIOPSY 2. ULTRASOUND-GUIDED RIGHT CERVICAL LYMPH NODE CORE NEEDLE BIOPSY COMPARISON:  Neck CT-07/03/2019; ultrasound-guided right neck mass fine-needle aspiration-07/04/2019 MEDICATIONS: None ANESTHESIA/SEDATION: Moderate (conscious) sedation was employed during this procedure. A total of Versed 0.5 mg and Fentanyl 25 mcg was administered intravenously. Moderate Sedation Time: 14 minutes. The patient's level of consciousness and vital signs were monitored continuously by radiology nursing throughout the procedure under my direct supervision. COMPLICATIONS: None immediate. TECHNIQUE: Informed written consent was obtained from the patient after a discussion of the risks, benefits and alternatives to treatment. Questions regarding the procedure were encouraged and answered. Initial ultrasound scanning demonstrated grossly unchanged size and appearance of the at least 3.2 x 3.0 cm right sided neck mass, potentially associated with the right lobe of the thyroid (image 4). Additionally, note is made of an approximately 1.3 x 1.0 cm right cervical lymph node likely correlating with the dominant cervical lymph node seen on preceding neck CT image 58, series 3. Multiple ultrasound images were saved procedural documentation purposes. The procedure was planned. A timeout was performed prior to the initiation of the procedure. The operative sites were was prepped and draped in the usual sterile fashion, and a sterile drape was applied covering the operative field. A timeout was performed prior to the initiation of the procedure. Local anesthesia  was provided with 1% lidocaine with epinephrine. Initially beginning with the dominant right cervical lymph node, an 18 gauge  core needle biopsy device was utilized to obtain a 4 core needle biopsies under direct ultrasound guidance. Next, a separate 18 gauge core needle biopsy device was utilized to obtain 5 core needle biopsies of the dominant right neck mass. The samples were placed in saline and submitted separate to pathology. Superficial hemostasis was achieved with manual compression. Post procedure scan was negative for significant hematoma. Dressings were applied. The patient tolerated the procedure well without immediate postprocedural complication. IMPRESSION: 1. Technically successful ultrasound guided biopsy of dominant right cervical lymph node. 2. Technically successful ultrasound-guided biopsy of right-sided neck mass, potentially associated with the right lobe of the thyroid. Electronically Signed   By: Sandi Mariscal M.D.   On: 07/10/2019 16:38   [place] FNA SOFT TISSUE  Result Date: 07/04/2019 INDICATION: 85 year old with a right neck mass. Request for ultrasound-guided aspiration. EXAM: ULTRASOUND-GUIDED FINE NEEDLE ASPIRATION OF RIGHT NECK MASS MEDICATIONS: None. ANESTHESIA/SEDATION: None FLUOROSCOPY TIME:  None COMPLICATIONS: None immediate. PROCEDURE: Informed written consent was obtained from the patient after a thorough discussion of the procedural risks, benefits and alternatives. All questions were addressed. A timeout was performed prior to the initiation of the procedure. Right side of the neck was evaluated with ultrasound. Right neck lesion was identified. The lesion appeared to be mostly solid, therefore, many of the specimens were collected for cytology. Four fine-needle aspirations were performed with 25 gauge needles. Two fine-needle aspirations were obtained with 18 gauge needles. Bandage placed over the puncture site. FINDINGS: Heterogeneous hypoechoic lesion that appears to be  inseparable from the thyroid tissue. This soft tissue causes lateral displacement of the right common carotid artery and right internal jugular vein. No discrete fluid collections were identified. No significant fluid could be aspirated from this right neck lesion/mass. One of the 18 gauge fine-needle aspirations was collected for culture. IMPRESSION: Heterogeneous solid right neck mass. This lesion appears to be indistinguishable from the right thyroid tissue. No significant fluid could be aspirated from this right neck lesion. Ultrasound-guided fine-needle aspirations were obtained from the lesion. Electronically Signed   By: Markus Daft M.D.   On: 07/04/2019 17:42   [place] CORE BIOPSY (SOFT TISSUE)  Result Date: 07/10/2019 INDICATION: Indeterminate right-sided neck mass, potentially associated with the right lobe of the thyroid with associated right cervical lymphadenopathy. Patient was admitted with concern for infection, however FNA biopsy of the dominant right-sided neck mass demonstrated atypia. As such, patient returns today for ultrasound-guided core needle biopsy of right-sided neck mass as well as ultrasound-guided core needle biopsy of right cervical lymph node for tissue diagnostic purposes. EXAM: 1. ULTRASOUND-GUIDED RIGHT NECK MASS CORE NEEDLE BIOPSY 2. ULTRASOUND-GUIDED RIGHT CERVICAL LYMPH NODE CORE NEEDLE BIOPSY COMPARISON:  Neck CT-07/03/2019; ultrasound-guided right neck mass fine-needle aspiration-07/04/2019 MEDICATIONS: None ANESTHESIA/SEDATION: Moderate (conscious) sedation was employed during this procedure. A total of Versed 0.5 mg and Fentanyl 25 mcg was administered intravenously. Moderate Sedation Time: 14 minutes. The patient's level of consciousness and vital signs were monitored continuously by radiology nursing throughout the procedure under my direct supervision. COMPLICATIONS: None immediate. TECHNIQUE: Informed written consent was obtained from the patient after a discussion of the  risks, benefits and alternatives to treatment. Questions regarding the procedure were encouraged and answered. Initial ultrasound scanning demonstrated grossly unchanged size and appearance of the at least 3.2 x 3.0 cm right sided neck mass, potentially associated with the right lobe of the thyroid (image 4). Additionally, note is made of an approximately 1.3 x 1.0 cm right cervical lymph node likely correlating  with the dominant cervical lymph node seen on preceding neck CT image 58, series 3. Multiple ultrasound images were saved procedural documentation purposes. The procedure was planned. A timeout was performed prior to the initiation of the procedure. The operative sites were was prepped and draped in the usual sterile fashion, and a sterile drape was applied covering the operative field. A timeout was performed prior to the initiation of the procedure. Local anesthesia was provided with 1% lidocaine with epinephrine. Initially beginning with the dominant right cervical lymph node, an 18 gauge core needle biopsy device was utilized to obtain a 4 core needle biopsies under direct ultrasound guidance. Next, a separate 18 gauge core needle biopsy device was utilized to obtain 5 core needle biopsies of the dominant right neck mass. The samples were placed in saline and submitted separate to pathology. Superficial hemostasis was achieved with manual compression. Post procedure scan was negative for significant hematoma. Dressings were applied. The patient tolerated the procedure well without immediate postprocedural complication. IMPRESSION: 1. Technically successful ultrasound guided biopsy of dominant right cervical lymph node. 2. Technically successful ultrasound-guided biopsy of right-sided neck mass, potentially associated with the right lobe of the thyroid. Electronically Signed   By: Sandi Mariscal M.D.   On: 07/10/2019 16:38   VAS [place] UPPER EXTREMITY VENOUS DUPLEX  Result Date: 07/13/2019 UPPER VENOUS STUDY   Indications: Swelling Risk Factors: None identified. Limitations: Poor ultrasound/tissue interface. Comparison Study: No prior studies. Performing Technologist: Oliver Hum RVT  Examination Guidelines: A complete evaluation includes B-mode imaging, spectral Doppler, color Doppler, and power Doppler as needed of all accessible portions of each vessel. Bilateral testing is considered an integral part of a complete examination. Limited examinations for reoccurring indications may be performed as noted.  Right Findings: +----------+------------+---------+-----------+----------+-------+ RIGHT     CompressiblePhasicitySpontaneousPropertiesSummary +----------+------------+---------+-----------+----------+-------+ IJV           Full       Yes       Yes                      +----------+------------+---------+-----------+----------+-------+ Subclavian    Full       Yes       Yes                      +----------+------------+---------+-----------+----------+-------+ Axillary      Full       Yes       Yes                      +----------+------------+---------+-----------+----------+-------+ Brachial      Full       Yes       Yes                      +----------+------------+---------+-----------+----------+-------+ Radial        Full                                          +----------+------------+---------+-----------+----------+-------+ Ulnar         Full                                          +----------+------------+---------+-----------+----------+-------+ Cephalic      Full                                          +----------+------------+---------+-----------+----------+-------+  Basilic       Full                                          +----------+------------+---------+-----------+----------+-------+  Left Findings: +----------+------------+---------+-----------+----------+-------+ LEFT      CompressiblePhasicitySpontaneousPropertiesSummary  +----------+------------+---------+-----------+----------+-------+ Subclavian    Full       Yes       Yes                      +----------+------------+---------+-----------+----------+-------+  Summary:  Right: No evidence of deep vein thrombosis in the upper extremity. No evidence of superficial vein thrombosis in the upper extremity.  Left: No evidence of thrombosis in the subclavian.  *See table(s) above for measurements and observations.  Diagnosing physician: Monica Martinez MD Electronically signed by Monica Martinez MD on 07/13/2019 at 8:10:02 PM.    Final    CT MAXILLOFACIAL WO CONTRAST  Result Date: 07/02/2019 CLINICAL DATA:  85 year old male. Sepsis. Sinus congestion. EXAM: CT MAXILLOFACIAL WITHOUT CONTRAST TECHNIQUE: Multidetector CT imaging of the maxillofacial structures was performed. Multiplanar CT image reconstructions were also generated. COMPARISON:  Head CT 06/28/2019, brain MRI 08/11/2011. FINDINGS: Osseous: The visible cervical vertebrae appear intact (through C5) but there is abnormal prevertebral soft tissue swelling and retropharyngeal or prevertebral fluid/edema (series 3, image 16). This seems to progress caudally in the neck, and becomes eccentric to the right at the level of the thyroid cartilage on series 3, image 87. Mandible intact. No acute dental abnormality identified. Maxilla, zygoma, nasal bones, central skull base and visible calvarium appear intact. Orbits: Intact orbital walls. Postoperative changes to both globes, otherwise negative noncontrast orbits soft tissues. Sinuses: Posterior left ethmoid and left sphenoid sinus opacification with periosteal thickening. But no complicating features are identified, and the remaining paranasal sinuses are well pneumatized. Tympanic cavities and mastoids are clear. Soft tissues: Abnormal prevertebral/retropharyngeal fluid or edema tracking inferiorly in the neck as stated above. There is also a partially retropharyngeal  course of the right ICA. There are reactive appearing right level 3 lymph nodes on series 3, image 85. But elsewhere the noncontrast deep soft tissue spaces of the face and neck are within normal limits. Limited intracranial: Stable, No acute intracranial abnormality. Calcified atherosclerosis at the skull base. IMPRESSION: 1. The dominant abnormality is prevertebral/retropharyngeal fluid or edema tracking which seems to increase towards the lower neck. Reactive appearing right level 3 lymph nodes. Visible cervical vertebrae appear intact, but in the setting of sepsis lower Cervical Discitis-Osteomyelitis is a leading consideration. Retropharyngeal infection is in the differential. Cervical Spine MRI without and with contrast would be most valuable. Failing that Neck CT with IV contrast would be recommended. 2. Posterior left ethmoid and left sphenoid sinusitis, but has a chronic appearance with no complicating features. Electronically Signed   By: Genevie Ann M.D.   On: 07/02/2019 14:21   US Abdomen Limited RUQ  Result Date: 06/28/2019 CLINICAL DATA:  Altered mental status.  LFT elevation. EXAM: ULTRASOUND ABDOMEN LIMITED RIGHT UPPER QUADRANT COMPARISON:  None. FINDINGS: Gallbladder: No gallstones or wall thickening visualized. No sonographic Murphy sign noted by sonographer. Common bile duct: Diameter: The common bile duct was poorly evaluated. Liver: Diffuse increased echogenicity with slightly heterogeneous liver. Appearance typically secondary to fatty infiltration. Fibrosis secondary consideration. No secondary findings of cirrhosis noted. No focal hepatic lesion or intrahepatic biliary duct dilatation. The liver surface appears nodular. Portal  vein is patent on color Doppler imaging with normal direction of blood flow towards the liver. Other: None. IMPRESSION: 1. No evidence for cholelithiasis. 2. The common bile duct was not visualized. 3. Cirrhosis. Electronically Signed   By: Constance Holster M.D.   On:  06/28/2019 21:00       IMPRESSION/PLAN: 1. 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 Dr. Lisbeth Renshaw has reviewed the case and discusses the pathology findings and reviews the nature of malignancies that effect the neck and retropharyngeal space. I spoke with the patient's wife and given his current comorbidities and lack of symptoms, and desire to get home to be with his family rather than pursue treatment, she declines any further therapy.  We discussed the risks, benefits, short, and long term effects of radiotherapy as well as the delivery and logistics of radiotherapy. He would offer this therapy over 2 weeks however this does not match with their goals of care so we will be available as needed moving forward. I think that they would benefit from palliative medicine evaluation for goals of care discussion and next steps for his discharge planning.     In a visit lasting 45 minutes, greater than 50% of the time was spent by phone and in floor time discussing the patient's condition, in preparation for the discussion, and coordinating the patient's care.     Carola Rhine, PAC

## 2019-07-18 NOTE — Progress Notes (Signed)
Social Work Patient ID: Glen Finley, male   DOB: [birth year], 85 y.o.   MRN: [id number]    SW met with pt wife to provide updates from team conference, and informed there will be updates once there is a d/c date established since pt is still in the process of being evaluated.   Loralee Pacas, MSW, [place] Office: [phone] Cell: [phone] Fax: [phone]

## 2019-07-18 NOTE — Progress Notes (Signed)
[facility] PHYSICAL MEDICINE & REHABILITATION PROGRESS NOTE   Subjective/Complaints:  Up with SLP. No new problems. Asked how aggressive his neck tumor was  ROS: Patient denies fever, rash,   blurred vision, nausea, vomiting, diarrhea, cough, shortness of breath or chest pain, joint or back pain, headache, or mood change.    Objective:   No results found. Recent Labs    07/17/19 0122 07/18/19 0623  WBC 8.5 7.2  HGB 12.7* 13.6  HCT 38.4* 41.1  PLT 107* 116*   Recent Labs    07/17/19 0122 07/18/19 0623  NA 141 140  K 4.5 3.7  CL 109 105  CO2 24 27  GLUCOSE 88 90  BUN 15 16  CREATININE 0.93 0.90  CALCIUM 8.2* 8.4*    Intake/Output Summary (Last 24 hours) at 07/18/2019 1023 Last data filed at 07/18/2019 0818 Gross per 24 hour  Intake 240 ml  Output 150 ml  Net 90 ml     Physical Exam: Vital Signs Blood pressure (!) 149/68, pulse 65, temperature 97.7 F (36.5 C), temperature source Oral, resp. rate 16, weight 83.7 kg, SpO2 93 %. Constitutional: No distress . Vital signs reviewed. HEENT: EOMI, oral membranes moist Neck: supple, multiple bruises around neck Cardiovascular: RRR without murmur. No JVD    Respiratory/Chest: CTA Bilaterally without wheezes or rales. Normal effort    GI/Abdomen: BS +, non-tender, non-distended Ext: no clubbing, cyanosis, or edema Psych: pleasant and cooperative Musculoskeletal:  Cervical back: Normal range of motion.  Neurological: alert and oriented. Fair insight and awareness. Normal language, a little dysarthric.  Some delays in processing. no focal rigidity. Motor 4/5 UE bilaterally. LE: 3+/5 HF, KE and 4/5 ADF/F. No focal sensory findings. DTR's trace to 1+ Skin: Skin iswarm. He isnot diaphoretic.  Psychiatric: Flat but cooperative    Assessment/Plan: 1. Functional deficits secondary to metastatic tumor to neck, hx of MG which require 3+ hours per day of interdisciplinary therapy in a comprehensive inpatient rehab  setting.  Physiatrist is providing close team supervision and 24 hour management of active medical problems listed below.  Physiatrist and rehab team continue to assess barriers to discharge/monitor patient progress toward functional and medical goals  Care Tool:  Bathing              Bathing assist       Upper Body Dressing/Undressing Upper body dressing        Upper body assist      Lower Body Dressing/Undressing Lower body dressing            Lower body assist       Toileting Toileting    Toileting assist       Transfers Chair/bed transfer  Transfers assist     Chair/bed transfer assist level: Contact Guard/Touching assist     Locomotion Ambulation   Ambulation assist      Assist level: Contact Guard/Touching assist Assistive device: Walker-rolling Max distance: 236ft   Walk 10 feet activity   Assist     Assist level: Contact Guard/Touching assist Assistive device: Walker-rolling   Walk 50 feet activity   Assist    Assist level: Contact Guard/Touching assist Assistive device: Walker-rolling    Walk 150 feet activity   Assist    Assist level: Contact Guard/Touching assist Assistive device: Walker-rolling    Walk 10 feet on uneven surface  activity   Assist           Wheelchair     Assist Will patient use wheelchair  at discharge?: No   Wheelchair activity did not occur: N/A         Wheelchair 50 feet with 2 turns activity    Assist    Wheelchair 50 feet with 2 turns activity did not occur: N/A       Wheelchair 150 feet activity     Assist  Wheelchair 150 feet activity did not occur: N/A       Blood pressure (!) 149/68, pulse 65, temperature 97.7 F (36.5 C), temperature source Oral, resp. rate 16, weight 83.7 kg, SpO2 93 %.  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   -beginning therapies today. Spoke briefly about him in team conference---short LOS, likely 5-7 days.  -patient may shower -Tumor is not resectable. Oncology (Dr. Irene Limbo) recommends Rad-Onc consult which has been placed. PET scan upon discharge from inpatient rehab (in about 2 weeks) per ENT (Dr. Blenda Nicely).  Palliative care involved as well.    -advised him to speak with onc/rad-onc about aggressiveness of carcinoma 2. Antithrombotics: -DVT/anticoagulation:SCDs. Right upper extremity Doppler negative. -antiplatelet therapy: N/A 3. Pain Management:Currently on no Tylenol or pain medication 4. Mood:Lexapro 10 mg daily. Provide emotional support -antipsychotic agents: N/A 5. Neuropsych: This patientiscapable of making decisions on hisown behalf. 6. Skin/Wound Care:Routine skin checks 7. Fluids/Electrolytes/Nutrition:Routine in and outs with follow-up chemistries 8. Acute hepatic/metabolic encephalopathy. Continue lactulose 30 mg 3 times daily. Latest ammonia level improved at 44. -cognitively improving. May have some baseline deficits 9. Chronic diastolic congestive heart failure. Lasix 20 mg daily. Monitor for signs of fluid overload. Daily weights   Filed Weights   07/18/19 0536  Weight: 83.7 kg    10. Hypertension. Hydralazine 50 mg every 8 hours, Tenormin 25 mg daily, lisinopril 20 mg twice daily. borderline control at present 11. Myasthenia gravis/progressive gait disorder. Patient on no medications. Follow-up outpatient neurologyDr. Krista Blue 12. Thrombocytopenia/elevated AST and total bilirubin. Likely due to cirrhosis/liver disease.    4/27 platelets steady at 116k, AST/bili about the same 13. 4.6 cm ascending thoracic aortic aneurysm. Recommend semiannual imaging followed by CTA or MRA and referral to cardiothoracic surgery.    LOS: 1 days A FACE TO FACE EVALUATION WAS  PERFORMED  Meredith Staggers 07/18/2019, 10:23 AM

## 2019-07-18 NOTE — Patient Care Conference (Signed)
Inpatient RehabilitationTeam Conference and Plan of Care Update Date: 07/18/2019   Time: 10:10 AM    Patient Name: Glen Finley      Medical Record Number: [id number]  Date of Birth: 04-Apr-1934 Sex: Male         Room/Bed: 4M04C/4M04C-01 Payor Info: Payor: Marine scientist / Plan: [facility] MEDICARE / Product Type: *No Product type* /    Admit Date/Time:  07/17/2019  5:53 PM  Primary Diagnosis:  Malignant tumor of neck [facility])  Patient Active Problem List   Diagnosis Date Noted  . Metastatic carcinoma ([place])   . Malignant tumor of neck ([place])   . Retropharyngeal abscess 07/03/2019  . Liver cirrhosis ([place]) 07/02/2019  . Sepsis ([place]) 07/01/2019  . Acute hepatic encephalopathy 06/28/2019  . Encephalopathy 06/28/2019  . Transient alteration of awareness   . Sleep apnea 03/07/2015  . Headache(784.0)   . GERD (gastroesophageal reflux disease)   . Obesity   . REM sleep behavior disorder   . Other extrapyramidal disease and abnormal movement disorder   . Myasthenia gravis without exacerbation ([place])   . Abnormality of gait   . Unspecified psychosis   . Weakness   . Dysarthria 08/10/2011  . Gait instability 08/10/2011  . Hypertension 08/10/2011  . Hyperlipidemia 08/10/2011    Expected Discharge Date: Expected Discharge Date: (Estimated LOS 5-7 days)  Team Members Present: Physician leading conference: Dr. Alger Simons Care Coodinator Present: Loralee Pacas, LCSWA;Christina Sampson Goon, BSW;Genie Amarra Sawyer, RN, MSN Nurse Present: Debroah Loop, RN PT Present: Burnard Bunting, PT OT Present: Turner Daniels, OT SLP Present: Weston Anna, SLP PPS Coordinator present : Ileana Ladd, Burna Mortimer, SLP     Current Status/Progress Goal Weekly Team Focus  Bowel/Bladder   pt cont of bowel and bladder , has urgency @ times with bowel  remain cont of bowel and bladder, work on urgency  assess bowel and bladder qshift and prn, assist with tolieting needs    Swallow/Nutrition/ Hydration             ADL's   Eval pending        Mobility   min assist-CGA overall, gait 75' w/ RW, 28/56 on [place]  supervision  endurance, global strengthening, balance   Communication             Safety/Cognition/ Behavioral Observations  Eval Pending         Pain   pt has no c/o of pain at this time  remain free of pain or keep pain level <3  assess pain qshift and prn   Skin   generlaized bruisng over arms and legs and neck  remain free of any new skin impairments or infections  assess skin qshift and prn    Rehab Goals Patient on target to meet rehab goals: Yes *See Care Plan and progress notes for long and short-term goals.     Barriers to Discharge  Current Status/Progress Possible Resolutions Date Resolved   Nursing  Pending chemo/radiation               PT  Other (comments)  cognition- will need 24/7S for safety              OT                  SLP                SW                Discharge Planning/Teaching  Needs:  24/7 care from pt wife (supervision); wife reports pt will have support from family/friends to assist  Family education as recommended by therapy   Team Discussion: MD metastatic lung CA, onc involved, rad onc to see, ENT following, PET scan in a couple weeks, has MG, HF, hepatic encephalopathy.  RN BM yest, confusion at night.  SLP memory impaired, basic prob solving impaired, dry mought, slurred speech.  PT CGA, limited by fatigue.  OT eval pending.   Revisions to Treatment Plan: N/A     Medical Summary Current Status: metastatic cancer to neck. improving hepatic encephalopathy. Rad-Onc to see today. baseline cognitive deficits. monitoring nutrition, volume status Weekly Focus/Goal: Rad-Onc plan, nutrition, CV, platelets  Barriers to Discharge: Medical stability   Possible Resolutions to Barriers: see medical progress notes   Continued Need for Acute Rehabilitation Level of Care: The patient requires daily medical  management by a physician with specialized training in physical medicine and rehabilitation for the following reasons: Direction of a multidisciplinary physical rehabilitation program to maximize functional independence : Yes Medical management of patient stability for increased activity during participation in an intensive rehabilitation regime.: Yes Analysis of laboratory values and/or radiology reports with any subsequent need for medication adjustment and/or medical intervention. : Yes   I attest that I was present, lead the team conference, and concur with the assessment and plan of the team.   Jodell Cipro M 07/18/2019, 7:24 PM   Team conference was held via web/ teleconference due to [place] - 19

## 2019-07-18 NOTE — Care Management (Signed)
Inpatient [facility] Individual Statement of Services  Patient Name:  Amogh Komatsu Riddles  Date:  07/18/2019  Welcome to the [facility].  Our goal is to provide you with an individualized program based on your diagnosis and situation, designed to meet your specific needs.  With this comprehensive rehabilitation program, you will be expected to participate in at least 3 hours of rehabilitation therapies Monday-Friday, with modified therapy programming on the weekends.  Your rehabilitation program will include the following services:  Physical Therapy (PT), Occupational Therapy (OT), Speech Therapy (ST), 24 hour per day rehabilitation nursing, Therapeutic Recreaction (TR), Psychology, Neuropsychology, Case Management (Social Worker), Rehabilitation Medicine, Nutrition Services, Pharmacy Services and Other  Weekly team conferences will be held on Tuesdays to discuss your progress.  Your Social Worker will talk with you frequently to get your input and to update you on team discussions.  Team conferences with you and your family in attendance may also be held.  Expected length of stay: 7 days   Overall anticipated outcome: Supervision  Depending on your progress and recovery, your program may change. Your Social Worker will coordinate services and will keep you informed of any changes. Your Social Worker's name and contact numbers are listed  below.  The following services may also be recommended but are not provided by the [facility] will be made to provide these services after discharge if needed.  Arrangements include referral to agencies that provide these services.  Your insurance has been verified to be:  [facility] Medicare  Your primary doctor is:  Shon Baton  Pertinent information will be shared with  your doctor and your insurance company.  Social Worker:  Loralee Pacas, [place] or ([phone]  Information discussed with and copy given to patient by: Rana Snare, 07/18/2019, 2:43 PM

## 2019-07-18 NOTE — Progress Notes (Signed)
Inpatient Rehabilitation  Patient information reviewed and entered into eRehab system by Britani Beattie M. Enzley Kitchens, M.A., CCC/SLP, PPS Coordinator.  Information including medical coding, functional ability and quality indicators will be reviewed and updated through discharge.    

## 2019-07-19 ENCOUNTER — Inpatient Hospital Stay (HOSPITAL_COMMUNITY): Payer: Medicare Other

## 2019-07-19 ENCOUNTER — Inpatient Hospital Stay (HOSPITAL_COMMUNITY): Payer: Medicare Other | Admitting: Speech Pathology

## 2019-07-19 ENCOUNTER — Encounter (HOSPITAL_COMMUNITY): Payer: Medicare Other | Admitting: Psychology

## 2019-07-19 ENCOUNTER — Inpatient Hospital Stay (HOSPITAL_COMMUNITY): Payer: Medicare Other | Admitting: Physical Therapy

## 2019-07-19 ENCOUNTER — Ambulatory Visit: Payer: Medicare Other | Admitting: Radiation Oncology

## 2019-07-19 DIAGNOSIS — G9341 Metabolic encephalopathy: Secondary | ICD-10-CM

## 2019-07-19 LAB — GLUCOSE, CAPILLARY: Glucose-Capillary: 98 mg/dL (ref 70–99)

## 2019-07-19 NOTE — Progress Notes (Signed)
HCPOA documents copied and original was returned to patient's wife. Notified Anderson Malta, Retail buyer, and Kell, charge RN, and was instructed to place in paper chart.

## 2019-07-19 NOTE — Progress Notes (Signed)
Occupational Therapy Session Note  Patient Details  Name: [name] MRN: [id number] Date of Birth: 1934-09-02  Today's Date: 07/19/2019 OT Individual Time: 1300-1411 OT Individual Time Calculation (min): 71 min    Short Term Goals: Week 1:  OT Short Term Goal 1 (Week 1): STG=LTG  Skilled Therapeutic Interventions/Progress Updates:    Self care retraining: pt received in w/c and declining bathing/dressing. Ot checks brief and incontinent of bladder. Pt unaware. Pt sit to stand at sink with CGA and OT changes brief total A in stanidng after S for pt to wash peri area. Pt completes step over transfer tub ledge with RW and MIN A and MOD VC for lateral method as pt does not have grab bars and is unsteady steping forward without something to hold onto. Pt agreeable to needing someone to supervise transfers at home in and out of tub.   There Act. Pt completes funcitonal mobility throughout session to various surfaces and distances with RW at ambulatory level in dayroom and ADL apartment. Pt requries skilled cuing for RW management and keeping feet inside RW as well as larger BOS during turns. Pt completes dynavision activiity in standing on standard tile, compliant surface and in close stance with up to MIN A in close stance while reaching to find stimulus with no UE support. AVG 2.7-3.5 reaction time. Exited session with pt seated in bed, exit alarm on and call light in reach  Therapy Documentation Precautions:  Precautions Precautions: Fall Restrictions Weight Bearing Restrictions: No General:   Vital Signs:  Pain:   ADL: ADL Grooming: Setup, Minimal assistance Upper Body Bathing: Contact guard Where Assessed-Upper Body Bathing: Shower Lower Body Bathing: Moderate assistance Where Assessed-Lower Body Bathing: Shower Upper Body Dressing: Minimal assistance Where Assessed-Upper Body Dressing: Edge of bed Lower Body Dressing: Moderate assistance Where Assessed-Lower Body Dressing:  Edge of bed Toileting: Other (Comment)(incontient) Where Assessed-Toileting: Glass blower/designer: Psychiatric nurse Method: Optometrist: Walk in Facilities manager Transfer: Minimal assistance Vision   Perception    Praxis   Exercises:   Other Treatments:     Therapy/Group: Individual Therapy  Tonny Branch 07/19/2019, 2:11 PM

## 2019-07-19 NOTE — Progress Notes (Signed)
Palliative:  Consult received and chart reviewed - scheduled meeting for 2:30 PM on 4/29 with patient and patient's spouse.  Juel Burrow, DNP, AGNP-C Palliative Medicine Team Team Phone # [phone]  Pager # [phone]  NO CHARGE

## 2019-07-19 NOTE — Progress Notes (Signed)
[place]  PHYSICAL MEDICINE & REHABILITATION PROGRESS NOTE   Subjective/Complaints:  up in bed starting on breakfast. Doesn't recall seeing rad-onc yesterday. "my wife wanted to talk with them."  ROS: Patient denies fever, rash, sore throat, blurred vision, nausea, vomiting, diarrhea, cough, shortness of breath or chest pain, joint or back pain, headache, or mood change.    Objective:   No results found. Recent Labs    07/17/19 0122 07/18/19 0623  WBC 8.5 7.2  HGB 12.7* 13.6  HCT 38.4* 41.1  PLT 107* 116*   Recent Labs    07/17/19 0122 07/18/19 0623  NA 141 140  K 4.5 3.7  CL 109 105  CO2 24 27  GLUCOSE 88 90  BUN 15 16  CREATININE 0.93 0.90  CALCIUM 8.2* 8.4*    Intake/Output Summary (Last 24 hours) at 07/19/2019 0759 Last data filed at 07/19/2019 0700 Gross per 24 hour  Intake 480 ml  Output 200 ml  Net 280 ml     Physical Exam: Vital Signs Blood pressure (!) 154/68, pulse 67, temperature 98.6 F (37 C), temperature source Oral, resp. rate 18, weight 83.7 kg, SpO2 92 %. Constitutional: No distress . Vital signs reviewed. HEENT: EOMI, oral membranes moist Neck: supple, ecchymoses around neck Cardiovascular: RRR without murmur. No JVD    Respiratory/Chest: CTA Bilaterally without wheezes or rales. Normal effort    GI/Abdomen: BS +, non-tender, non-distended Ext: no clubbing, cyanosis, or edema Psych: pleasant and cooperative Musculoskeletal:  Cervical back: Normal range of motion.  Neurological: alert and oriented to person, place. limited insight and awareness. Normal language, a little dysarthric. STM deficits Some delays in processing. no focal rigidity. Motor 4/5 UE bilaterally. LE: 3+/5 HF, KE and 4/5 ADF/F. No focal sensory findings. DTR's trace to 1+in all 4's Skin: Skin iswarm. He isnot diaphoretic.       Assessment/Plan: 1. Functional deficits secondary to metastatic tumor to neck, hx of MG which require 3+ hours per day of  interdisciplinary therapy in a comprehensive inpatient rehab setting.  Physiatrist is providing close team supervision and 24 hour management of active medical problems listed below.  Physiatrist and rehab team continue to assess barriers to discharge/monitor patient progress toward functional and medical goals  Care Tool:  Bathing    Body parts bathed by patient: Right arm, Left arm, Chest, Abdomen, Front perineal area, Buttocks, Right upper leg, Left upper leg, Right lower leg, Left lower leg, Face         Bathing assist Assist Level: Minimal Assistance - Patient > 75%     Upper Body Dressing/Undressing Upper body dressing   What is the patient wearing?: Pull over shirt    Upper body assist Assist Level: Set up assist    Lower Body Dressing/Undressing Lower body dressing      What is the patient wearing?: Incontinence brief, Pants     Lower body assist Assist for lower body dressing: Moderate Assistance - Patient 50 - 74%     Toileting Toileting Toileting Activity did not occur Landscape architect and hygiene only): N/A (no void or bm)(incontient of BM)  Toileting assist       Transfers Chair/bed transfer  Transfers assist     Chair/bed transfer assist level: Minimal Assistance - Patient > 75%     Locomotion Ambulation   Ambulation assist      Assist level: Minimal Assistance - Patient > 75% Assistive device: No Device Max distance: 232ft   Walk 10 feet activity   Assist  Assist level: Minimal Assistance - Patient > 75% Assistive device: No Device   Walk 50 feet activity   Assist    Assist level: Minimal Assistance - Patient > 75% Assistive device: Walker-rolling    Walk 150 feet activity   Assist    Assist level: Minimal Assistance - Patient > 75% Assistive device: Walker-rolling    Walk 10 feet on uneven surface  activity   Assist Walk 10 feet on uneven surfaces activity did not occur: Safety/medical  concerns(fatigue)   Assist level: Contact Guard/Touching assist Assistive device: Aeronautical engineer Will patient use wheelchair at discharge?: No   Wheelchair activity did not occur: N/A         Wheelchair 50 feet with 2 turns activity    Assist    Wheelchair 50 feet with 2 turns activity did not occur: N/A       Wheelchair 150 feet activity     Assist  Wheelchair 150 feet activity did not occur: N/A       Blood pressure (!) 154/68, pulse 67, temperature 98.6 F (37 C), temperature source Oral, resp. rate 18, weight 83.7 kg, SpO2 92 %.  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  -patient may shower -Tumor is not resectable. Oncology (Dr. Irene Limbo) following. Rad-Onc spoke to wife and decision was made that he would not want palliative XRT. Palliative care involved.   ENT (Dr. Blenda Nicely) follow up as outpt potentially  -mental status seems to wax and wane a bit.  -short LOS  2. Antithrombotics: -DVT/anticoagulation:SCDs. Right upper extremity Doppler negative. -antiplatelet therapy: N/A 3. Pain Management:Currently on no Tylenol or pain medication 4. Mood:Lexapro 10 mg daily. Provide emotional support -antipsychotic agents: N/A 5. Neuropsych: This patientiscapable of making decisions on hisown behalf. 6. Skin/Wound Care:Routine skin checks 7. Fluids/Electrolytes/Nutrition:Routine in and outs with follow-up chemistries 8. Acute hepatic/metabolic encephalopathy. Continue lactulose 30 mg 3 times daily. Latest ammonia level improved at 44. -cognitively waxe and wanes, some improvement. Baseline dysfunction likely 9. Chronic diastolic congestive heart failure. Lasix 20 mg daily. Monitor for signs of fluid overload. Daily weights   Filed Weights    07/18/19 0536  Weight: 83.7 kg    10. Hypertension. Hydralazine 50 mg every 8 hours, Tenormin 25 mg daily, lisinopril 20 mg twice daily. borderline to fair control at present 11. Myasthenia gravis/progressive gait disorder. Patient on no medications. Follow-up outpatient neurologyDr. Krista Blue 12. Thrombocytopenia/elevated AST and total bilirubin. Likely due to cirrhosis/liver disease.    4/27 platelets steady at 116k, AST/bili about the same  4/28 recheck labs Friday 13. 4.6 cm ascending thoracic aortic aneurysm. Recommend semiannual imaging followed by CTA or MRA  (? referral to cardiothoracic surgery given above prognosis).    LOS: 2 days A FACE TO FACE EVALUATION WAS PERFORMED  Meredith Staggers 07/19/2019, 7:59 AM

## 2019-07-19 NOTE — Progress Notes (Signed)
Physical Therapy Session Note  Patient Details  Name: Glen Finley MRN: [id number] Date of Birth: [birth year]  Today's Date: 07/19/2019 PT Individual Time: 0805-0900 PT Individual Time Calculation (min): 55 min   Short Term Goals: Week 1:  PT Short Term Goal 1 (Week 1): STG = LTGs due to ELOS  Skilled Therapeutic Interventions/Progress Updates:   Pt received in supine, agreeable to therapy and denies pain. Pt noted to be incontinent of bowel and bladder, it had leaked through brief and onto bed. Pt reports he did sense he needed to go, but is unable to state why he did not call for assistance. Encouraged him to call for assistance to go to bathroom when needed and that staff had no problem assisting him. Supine>sit w/ supervision. Pt continent of further void in urinal. Sit<>stand to RW w/ CGA while therapist changed brief and pulled new pair of pants up and over hips w/ total assist for time management. Pt changed shirt w/ supervision and verbal cues for technique. Pt donned shoes w/ supervision at EOB as well. Increased verbal cues needed for initiation, sequencing, and functional problem solving/error correction of all functional movements this session. Ambulated to/from therapy gym w/ CGA using RW. Verbal and tactile cues for RW management/safety. Worked on static standing balance w/o UE support while performing cognitive pipe tree task. Had pt perform simple designs, 1 of which from memory. Needed min cues for problem solving and error correction. Verbal/tactile cues for upright posture. Worked on dynamic standing balance w/ CGA-min assist while reaching to pin clothespins. Reaching required trunk rotation and lateral weight shifting at pelvis. Ambulated back to room and ended session in w/c, all needs in reach.   Therapy Documentation Precautions:  Precautions Precautions: Fall Restrictions Weight Bearing Restrictions: No Vital Signs: Therapy Vitals Temp: 98.6 F (37 C) Temp Source:  Oral Pulse Rate: 67 Resp: 18 BP: (!) 154/68 Patient Position (if appropriate): Sitting Oxygen Therapy SpO2: 92 % O2 Device: Room Air  Therapy/Group: Individual Therapy  Tamari Busic K Anira Senegal 07/19/2019, 9:03 AM

## 2019-07-19 NOTE — Plan of Care (Signed)
  Problem: RH SAFETY Goal: RH STG ADHERE TO SAFETY PRECAUTIONS W/ASSISTANCE/DEVICE Description: STG Adhere to Safety Precautions With mod I assist and a walker  Outcome: Progressing Goal: RH STG DECREASED RISK OF FALL WITH ASSISTANCE Description: STG Decreased Risk of Fall With mod I assist Outcome: Progressing   Problem: RH BOWEL ELIMINATION Goal: RH STG MANAGE BOWEL WITH ASSISTANCE Description: STG Manage Bowel independently  Outcome: Progressing

## 2019-07-19 NOTE — Progress Notes (Signed)
Social Work Assessment and Plan   Patient Details  Name: Glen Finley MRN: [id number] Date of Birth: 10-11-1934  Today's Date: 07/19/2019  Problem List:  Patient Active Problem List   Diagnosis Date Noted  . Metabolic encephalopathy   . Metastatic carcinoma ([place])   . Malignant tumor of neck ([place])   . Retropharyngeal abscess 07/03/2019  . Liver cirrhosis ([place]) 07/02/2019  . Sepsis ([place]) 07/01/2019  . Acute hepatic encephalopathy 06/28/2019  . Encephalopathy 06/28/2019  . Transient alteration of awareness   . Sleep apnea 03/07/2015  . Headache(784.0)   . GERD (gastroesophageal reflux disease)   . Obesity   . REM sleep behavior disorder   . Other extrapyramidal disease and abnormal movement disorder   . Myasthenia gravis without exacerbation ([place])   . Abnormality of gait   . Unspecified psychosis   . Weakness   . Dysarthria 08/10/2011    Class: Acute  . Gait instability 08/10/2011    Class: Acute  . Hypertension 08/10/2011    Class: Chronic  . Hyperlipidemia 08/10/2011    Class: Chronic   Past Medical History:  Past Medical History:  Diagnosis Date  . Abnormality of gait   . GERD (gastroesophageal reflux disease)   . Headache(784.0)   . Hyperlipidemia   . Hypertension   . Migraine   . Myasthenia gravis without exacerbation ([place])   . Obesity   . Other extrapyramidal disease and abnormal movement disorder   . REM sleep behavior disorder   . Unspecified psychosis   . Weakness    Past Surgical History:  Past Surgical History:  Procedure Laterality Date  . CATARACT EXTRACTION Bilateral    Social History:  reports that he has never smoked. He has never used smokeless tobacco. He reports that he does not drink alcohol or use drugs.  Family / Support Systems Marital Status: Married How Long?: 26 years Patient Roles: Spouse, Parent Spouse/Significant Other: Glen Finley (wife): [phone] Children: 2 adult children: dtr and son. Other Supports: Glen Finley  (grandson): [phone] Anticipated Caregiver: Wife Ability/Limitations of Caregiver: N/Glen; wife reports all family will help assist with his care needs Caregiver Availability: 24/7 Family Dynamics: Pt lives with wife who is primary caregiver. 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 Education: high school grad Read: Yes Write: Yes Employment Status: Retired Public relations account executive Issues: Denies Guardian/Conservator: N/Glen   Abuse/Neglect Abuse/Neglect Assessment Can Be Completed: Yes Physical Abuse: Denies Verbal Abuse: Denies Sexual Abuse: Denies Exploitation of patient/patient's resources: Denies Self-Neglect: Denies  Emotional Status Pt's affect, behavior and adjustment status: Pt is pleasant, and unsure if able to comprehend level of care needed. Recent Psychosocial Issues: Denies Psychiatric History: Denies; EMR reports Parkinson's vs lewy body dementia and bipolar Substance Abuse History: Denies  Patient / Family Perceptions, Expectations & Goals Pt/Family understanding of illness & functional limitations: Pt wife understands pt care needs Premorbid pt/family roles/activities: Independent Anticipated changes in roles/activities/participation: Assitance wtih ADLs/IADLs  Recruitment consultant: None Premorbid Home Care/DME Agencies: None Transportation available at discharge: Family to d/c home Resource referrals recommended: Neuropsychology  Discharge Planning Living Arrangements: Spouse/significant other Support Systems: Spouse/significant other, Children, Other relatives, Friends/neighbors Type of Residence: Private residence Insurance Resources: Multimedia programmer (specify)([employer] Medicare) Financial Resources: Social Security Financial Screen  Referred: No Living Expenses: Own Money Management: Spouse Does the patient have any problems obtaining your  medications?: No Social Work Anticipated Follow Up Needs: HH/OP  Clinical Impression SW met with pt and pt wife in room to introduce self, explain role, and discuss discharge process. Pt is veteran: [employer] [phone]. No HCPOA. Pt wife reports pt has access to Glen RW, w/c, and hospital bed if needed.   Glen Finley Glen Finley 07/19/2019, 4:06 PM

## 2019-07-19 NOTE — Progress Notes (Signed)
Speech Language Pathology Daily Session Note  Patient Details  Name: Glen Finley MRN: [id number] Date of Birth: 07-Jun-1934  Today's Date: 07/19/2019 SLP Individual Time: 1045-1130 SLP Individual Time Calculation (min): 45 min  Short Term Goals: Week 1: SLP Short Term Goal 1 (Week 1): STGs=LTGs due to ELOS  Skilled Therapeutic Interventions: Skilled treatment session focused on cognitive goals. SLP facilitated session by providing Mod A verbal and visual cues for problem solving during a 4 and 6 step picture sequencing task. Patient demonstrated appropriate attention to tasks. Patient left upright in the wheelchair with alarm on and all needs within reach. Continue with current plan of care.      Pain Pain Assessment Pain Scale: 0-10 Pain Score: 0-No pain  Therapy/Group: Individual Therapy  Torryn Fiske 07/19/2019, 11:38 AM

## 2019-07-19 NOTE — Progress Notes (Signed)
I called the patients wife and she is interested in palliative care options. She declines any further work up and I'll let ENT know as well.

## 2019-07-19 NOTE — Plan of Care (Signed)
  Problem: RH BOWEL ELIMINATION Goal: RH STG MANAGE BOWEL WITH ASSISTANCE Description: STG Manage Bowel independently  Outcome: Progressing   Problem: RH SKIN INTEGRITY Goal: RH STG SKIN FREE OF INFECTION/BREAKDOWN Outcome: Progressing Goal: RH STG MAINTAIN SKIN INTEGRITY WITH ASSISTANCE Description: STG Maintain Skin Integrity with mod I assist  Outcome: Progressing   Problem: RH SAFETY Goal: RH STG ADHERE TO SAFETY PRECAUTIONS W/ASSISTANCE/DEVICE Description: STG Adhere to Safety Precautions With mod I assist and a walker  Outcome: Progressing Goal: RH STG DECREASED RISK OF FALL WITH ASSISTANCE Description: STG Decreased Risk of Fall With mod I assist Outcome: Progressing   Problem: RH KNOWLEDGE DEFICIT GENERAL Goal: RH STG INCREASE KNOWLEDGE OF SELF CARE AFTER HOSPITALIZATION Description: Patient will have knowledge of how to safely take care of himself after discharge  Outcome: Progressing   Problem: Consults Goal: RH GENERAL PATIENT EDUCATION Description: See Patient Education module for education specifics. Outcome: Progressing

## 2019-07-19 NOTE — Progress Notes (Signed)
Physical Therapy Session Note  Patient Details  Name: Glen Finley MRN: [id number] Date of Birth: August 26, 1934  Today's Date: 07/19/2019 PT Individual Time: 1615-1710 PT Individual Time Calculation (min): 55 min   Short Term Goals: Week 1:  PT Short Term Goal 1 (Week 1): STG = LTGs due to ELOS   Skilled Therapeutic Interventions/Progress Updates:   Pt received sitting in WC and agreeable to PT. Pt performed WC mobility x 158f with min assist for safety in turns and to prevent veer to the R. Pt transported to entrance of hospital. Gait training over unlevel cement sidewalk 2 x 1562fwith CGA-supervision assist. Min cues for RW placement to improve safety, occasional Lateral LOB to the L, but able to correct with CGA from PT. PT instructed pt in Modified [place] balance program: sit<>stand, LAQ, HS curls, hip abduction, tandem stance, mini squat, each completed x 10 BLE with BUE support on RW. Cues for posture, and technique to improve strengthening of targeted muscles and decrease compensations, as well as intermittent supervision A-CGA for safety. Additional gait training in hall of rehab unit x 1206fith RW and supervision assist, min cues for increased step width to improve stability.  Patient returned to room and [facility] and all needs met.           Therapy Documentation Precautions:  Precautions Precautions: Fall Restrictions Weight Bearing Restrictions: No   Pain: denies   Therapy/Group: Individual Therapy  AusLorie Phenix28/2021, 5:22 PM

## 2019-07-19 NOTE — Progress Notes (Signed)
Social Work Patient ID: Glen Finley, male   DOB: 09-01-1934, 85 y.o.   MRN: [id number]   SW met with pt and pt wife in room to provide updates on pt d/c being 07/26/2019. SW informed there will continue to be updates on d/c recommendations.   Loralee Pacas, MSW, [place] Office: [phone] Cell: [phone] Fax: [phone]

## 2019-07-19 NOTE — Consult Note (Signed)
Neuropsychological Consultation   Patient:   Glen Finley   DOB:   August 25, 1934  MR Number:  [id number]  Location:  [facility] [address] [facility] [place] [id number] [place] [address] Dept: [place]: [phone]           Date of Service:   07/19/2019  Start Time:   2:30 PM End Time:   3:30 PM  Provider/Observer:  Ilean Skill, Psy.D.       Clinical Neuropsychologist       Billing Code/Service: 55732  Chief Complaint:    Glen Finley is an 85 year old male with a history of hyperlipidemia, previous neurological work-up with consideration of Parkinson's versus Lewy body but ultimately settling on myasthenia gravis and discharged from neurology in 2025, diastolic congestive heart failure, hypertension, myasthenia gravis/gait disorder.  The patient also has a past medical history of diagnosis of obstructive sleep apnea but it was felt that he did not have severe enough symptoms to warrant CPAP use.   Prior head CT/MRIs dating back to 2013 indicate chronic microvascular ischemic changes in white matter but no previous indications of acute infarction.  In 2013 they were rated as mild to moderate in nature.  Patient recently discharged on 06/30/2019 after being treated for acute hepatic encephalopathy in the setting of lactulose noncompliance.  Most recent presentation was on 07/01/2019 with fever and altered mental status.  CT abdomen showed liver cirrhosis with evidence of portal hypertension.  Left colon diverticular disease without acute inflammatory process.  Patient was placed on a broad-spectrum antibiotic.  MRI cervical spine soft tissue of the neck CT showed prevertebral retropharyngeal fluid or edema extending from C1-C2 level inferiorly to the C7 level.  Extensive inflammatory change extending into the right neck soft tissue centered at C5 level.  Initial suspicions were of an abscess along the right posterior  aspect of the thyroid cartilage at the C5 level.  Ultrasound-guided FNA of right neck mass by IR on 07/04/2019 culture rare GPC.  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  Oncology and ENT recommended repeat biopsy which was done on 07/10/2019 and pathology raise the possibility of metastatic poorly differentiated lung noncell carcinoma.  Medical oncology has been consulted.  The patient has made significant improvements with regard to his acute hepatic metabolic encephalopathy and he has now completed therapy evaluations and been admitted to the comprehensive inpatient rehabilitation program.  Reason for Service:  The patient was referred for neuropsychological consultation due to coping and adjustment issues with a significant extensive medical history.  Below is the HPI for the current admission.  Glen Finley is an 85 year old right-handed male with history of hyperlipidemia, Parkinson's versus Lewy body disease, diastolic congestive heart failure hypertension, myasthenia gravis/gait disorder on no medications without exacerbationfollowed by neurology Dr. Krista Blue, obesity with BMI 29.43.  Patient recently discharged 06/30/2019 after being treated for acute hepatic encephalopathy in the setting of lactulose noncompliance. Per chart review patient lives with spouse. 1 level home with ramped entrance. Reportedly independent but sedentary prior to admission. Presented 07/01/2019 with fever and altered mental status. Admission chemistries BUN 23, creatinine 1.12, total bilirubin 3.2, blood cultures no growth to date, lactic acid 2.4, hemoglobin 14.8, platelets 94,000, urinalysis negative nitrite, ammonia 92, SARS coronavirus negative. CT of abdomen pelvis showed liver cirrhosis with evidence for portal hypertension. 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 Left colon diverticular disease without acute inflammatory  process. He was  placed on broad-spectrum antibiotics. CT maxillofacial as well as MRIcervical spine soft tissue of the neck CT showed abnormal prevertebral retropharyngeal fluid or edema extending from C1-C2 level inferiorly to the C7 level. Extensive inflammatory stranding and phlegmonous change extending into the right neck soft tissue centered at the C5 level. Findings suspicious for associated 3.0 x 2.0 abscess along the right posterior aspect of the thyroid cartilage at the C5 level.Patient had ultrasound-guided FNA of right neck mass by interventional radiology 07/04/2019 culture rare GPC. Blood cultures negative so far. 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 Oncology and ENT Dr Marcellinorecommended repeat biopsy which was done 07/10/2019 pathology showed positive straining for TTF-1-1 raising the possibility of metastatic poorly differentiated lung non-small cell carcinoma.. Medical oncology has been consulted current plan supportive care, potential XRT. All antibiotics have since been discontinued.CT of the chest 07/12/2019 showed slight decrease in size of infiltrating soft tissue right neck mass. Small bilateral pleural effusions with dependent lower lobe atelectasis. 4.6 cm ascending thoracic aortic aneurysm with recommendations of semiannual imaging followed by CTA or MRA. In regards to patient's acute hepatic metabolic encephalopathy he continues on lactulose 30 mg 3 times daily his ammonia levels have improved to44. Patient is tolerating a regular diet. Therapy evaluations completed and patient was admitted for a comprehensive rehab program.  Current Status:  The patient was seated in his wheelchair with reminder strap when I entered the room.  His mood and affect were pleasant and appropriate to the situation.  The patient generally was aware of what always happened to him and was able to give an accurate but shortened history of what is going on with him medically.  He reported that Dr.  Krista Blue had discharged him from neurology and that they were not continuing to consider issues of Parkinson's or Lewy body dementia.  The patient did appeared generally well oriented and while his retrieval and recall of some information was limited he was generally accurate there were no indications of confabulations or confusion he was able to describe fairly accurately what had been told to him regarding his soft tissue mass in his neck and describe what some of the preliminary plans were from an oncology standpoint.  The patient had some questions about the degree it was able to be treated and expressed his desire to actively address this metastatic carcinoma.  The patient denied any significant depression or anxiety.  He also denied any symptoms consistent with Lewy body or Parkinson's that would clearly be present if these conditions have been going on for 5 to 10 years since onset.  Behavioral Observation: Glen Finley  presents as a 85 y.o.-year-old Right Caucasian Male who appeared his stated age. his dress was Appropriate and he was Well Groomed and his manners were Appropriate to the situation.  his participation was indicative of Appropriate and Redirectable behaviors.  There were  physical disabilities noted.  he displayed an appropriate level of cooperation and motivation.     Interactions:    Active Appropriate and Redirectable  Attention:   abnormal and attention span appeared shorter than expected for age  Memory:   within normal limits; recent and remote memory intact  Visuo-spatial:  not examined  Speech (Volume):  normal  Speech:   normal; normal  Thought Process:  Coherent and Relevant  Though Content:  WNL; not suicidal and not homicidal  Orientation:   person, place, time/date and situation  Judgment:   Fair  Planning:   Fair  Affect:    Appropriate  Mood:    Dysphoric  Insight:   Fair  Intelligence:   normal  Medical History:   Past Medical History:   Diagnosis Date  . Abnormality of gait   . GERD (gastroesophageal reflux disease)   . Headache(784.0)   . Hyperlipidemia   . Hypertension   . Migraine   . Myasthenia gravis without exacerbation ([place])   . Obesity   . Other extrapyramidal disease and abnormal movement disorder   . REM sleep behavior disorder   . Unspecified psychosis   . Weakness    Psychiatric History:  No prior psychiatric history noted but significant neurological history.  Family Med/Psych History: No family history on file.  Impression/DX:  Glen Finley is an 85 year old male with a history of hyperlipidemia, previous neurological work-up with consideration of Parkinson's versus Lewy body but ultimately settling on myasthenia gravis and discharged from neurology in 2993, diastolic congestive heart failure, hypertension, myasthenia gravis/gait disorder.  The patient also has a past medical history of diagnosis of obstructive sleep apnea but it was felt that he did not have severe enough symptoms to warrant CPAP use.   Prior head CT/MRIs dating back to 2013 indicate chronic microvascular ischemic changes in white matter but no previous indications of acute infarction.  In 2013 they were rated as mild to moderate in nature.  Patient recently discharged on 06/30/2019 after being treated for acute hepatic encephalopathy in the setting of lactulose noncompliance.  Most recent presentation was on 07/01/2019 with fever and altered mental status.  CT abdomen showed liver cirrhosis with evidence of portal hypertension.  Left colon diverticular disease without acute inflammatory process.  Patient was placed on a broad-spectrum antibiotic.  MRI cervical spine soft tissue of the neck CT showed prevertebral retropharyngeal fluid or edema extending from C1-C2 level inferiorly to the C7 level.  Extensive inflammatory change extending into the right neck soft tissue centered at C5 level.  Initial suspicions were of an abscess along the right  posterior aspect of the thyroid cartilage at the C5 level.  Ultrasound-guided FNA of right neck mass by IR on 07/04/2019 culture rare GPC.  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  Oncology and ENT recommended repeat biopsy which was done on 07/10/2019 and pathology raise the possibility of metastatic poorly differentiated lung noncell carcinoma.  Medical oncology has been consulted.  The patient has made significant improvements with regard to his acute hepatic metabolic encephalopathy and he has now completed therapy evaluations and been admitted to the comprehensive inpatient rehabilitation program.  The patient was seated in his wheelchair with reminder strap when I entered the room.  His mood and affect were pleasant and appropriate to the situation.  The patient generally was aware of what always happened to him and was able to give an accurate but shortened history of what is going on with him medically.  He reported that Dr. Krista Blue had discharged him from neurology and that they were not continuing to consider issues of Parkinson's or Lewy body dementia.  The patient did appeared generally well oriented and while his retrieval and recall of some information was limited he was generally accurate there were no indications of confabulations or confusion he was able to describe fairly accurately what had been told to him regarding his soft tissue mass in his neck and describe what some of the preliminary plans were from an oncology standpoint.  The patient had some questions about the degree it was able to  be treated and expressed his desire to actively address this metastatic carcinoma.  The patient denied any significant depression or anxiety.  He also denied any symptoms consistent with Lewy body or Parkinson's that would clearly be present if these conditions have been going on for 5 to 10 years since onset.  Disposition/Plan:  I will follow up with the patient especially if there is any exacerbation  or recurrence of his acute encephalopathic symptoms.  Diagnosis:    Metastatic carcinoma ([place])         Electronically Signed   _______________________ Ilean Skill, Psy.D.

## 2019-07-20 ENCOUNTER — Inpatient Hospital Stay (HOSPITAL_COMMUNITY): Payer: Medicare Other | Admitting: Speech Pathology

## 2019-07-20 ENCOUNTER — Inpatient Hospital Stay (HOSPITAL_COMMUNITY): Payer: Medicare Other

## 2019-07-20 DIAGNOSIS — Z7189 Other specified counseling: Secondary | ICD-10-CM

## 2019-07-20 DIAGNOSIS — Z515 Encounter for palliative care: Secondary | ICD-10-CM

## 2019-07-20 NOTE — Progress Notes (Signed)
[facility] PHYSICAL MEDICINE & REHABILITATION PROGRESS NOTE   Subjective/Complaints:  up in bed. No new complaints. Fell asleep doing word puzzle in bed  ROS: Patient denies fever, rash, sore throat, blurred vision, nausea, vomiting, diarrhea, cough, shortness of breath or chest pain, joint or back pain, headache, or mood change.     Objective:   No results found. Recent Labs    07/18/19 0623  WBC 7.2  HGB 13.6  HCT 41.1  PLT 116*   Recent Labs    07/18/19 0623  NA 140  K 3.7  CL 105  CO2 27  GLUCOSE 90  BUN 16  CREATININE 0.90  CALCIUM 8.4*    Intake/Output Summary (Last 24 hours) at 07/20/2019 0900 Last data filed at 07/20/2019 0730 Gross per 24 hour  Intake 260 ml  Output --  Net 260 ml     Physical Exam: Vital Signs Blood pressure (!) 145/61, pulse 68, temperature 98.7 F (37.1 C), temperature source Oral, resp. rate 17, weight 84.2 kg, SpO2 92 %. Constitutional: No distress . Vital signs reviewed. HEENT: EOMI, oral membranes moist Neck: supple Cardiovascular: RRR without murmur. No JVD    Respiratory/Chest: CTA Bilaterally without wheezes or rales. Normal effort    GI/Abdomen: BS +, non-tender, non-distended Ext: no clubbing, cyanosis, or edema Psych: pleasant and cooperative Musculoskeletal:  Cervical back: Normal range of motion.  Neurological: alert and oriented to person, place. limited insight and awareness. Normal language, a little dysarthric. STM deficits Some delays in processing. no focal rigidity. Motor 4/5 UE bilaterally. LE: 3+/5 HF, KE and 4/5 ADF/F. No focal sensory findings. DTR's trace to 1+in all 4's Skin: Skin iswarm. He isnot diaphoretic.       Assessment/Plan: 1. 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  Physiatrist is providing close team supervision and 24 hour management of active medical  problems listed below.  Physiatrist and rehab team continue to assess barriers to discharge/monitor patient progress toward functional and medical goals  Care Tool:  Bathing    Body parts bathed by patient: Right arm, Left arm, Chest, Abdomen, Front perineal area, Buttocks, Right upper leg, Left upper leg, Right lower leg, Left lower leg, Face         Bathing assist Assist Level: Minimal Assistance - Patient > 75%     Upper Body Dressing/Undressing Upper body dressing   What is the patient wearing?: Pull over shirt    Upper body assist Assist Level: Set up assist    Lower Body Dressing/Undressing Lower body dressing      What is the patient wearing?: Pants, Incontinence brief     Lower body assist Assist for lower body dressing: Minimal Assistance - Patient > 75%     Toileting Toileting Toileting Activity did not occur (Clothing management and hygiene only): N/A (no void or bm)(incontient of BM)  Toileting assist Assist for toileting: Contact Guard/Touching assist     Transfers Chair/bed transfer  Transfers assist     Chair/bed transfer assist level: Contact Guard/Touching assist     Locomotion Ambulation   Ambulation assist      Assist level: Contact Guard/Touching assist Assistive device: Walker-rolling Max distance: 150'   Walk 10 feet activity   Assist     Assist level: Contact Guard/Touching assist Assistive device: Walker-rolling   Walk 50 feet activity   Assist    Assist level: Contact Guard/Touching  assist Assistive device: Walker-rolling    Walk 150 feet activity   Assist    Assist level: Contact Guard/Touching assist Assistive device: Walker-rolling    Walk 10 feet on uneven surface  activity   Assist Walk 10 feet on uneven surfaces activity did not occur: Safety/medical concerns(fatigue)   Assist level: Contact Guard/Touching assist Assistive device: Aeronautical engineer Will patient use  wheelchair at discharge?: No   Wheelchair activity did not occur: N/A         Wheelchair 50 feet with 2 turns activity    Assist    Wheelchair 50 feet with 2 turns activity did not occur: N/A       Wheelchair 150 feet activity     Assist  Wheelchair 150 feet activity did not occur: N/A       Blood pressure (!) 145/61, pulse 68, temperature 98.7 F (37.1 C), temperature source Oral, resp. rate 17, weight 84.2 kg, SpO2 92 %.  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  -patient may shower -Tumor is not resectable. Oncology (Dr. Irene Limbo) following. Rad-Onc spoke to wife and decision was made that he would not want palliative XRT. Palliative care involved.   ENT (Dr. Blenda Nicely) follow up as outpt potentially  -mental status seems to wax and wane a bit.  -ELOS 5/5 2. Antithrombotics: -DVT/anticoagulation:SCDs. Right upper extremity Doppler negative. -antiplatelet therapy: N/A 3. Pain Management:Currently on no Tylenol or pain medication 4. Mood:Lexapro 10 mg daily. teamProviding emotional support -antipsychotic agents: N/A 5. Neuropsych: This patientiscapable of making decisions on hisown behalf. 6. Skin/Wound Care:Routine skin checks 7. Fluids/Electrolytes/Nutrition:Routine in and outs with follow-up chemistries 8. Acute hepatic/metabolic encephalopathy. Continue lactulose 30 mg 3 times daily. Latest ammonia level improved at 44. -cognitively waxe and wanes, some improvement. Baseline dysfunction likely 9. Chronic diastolic congestive heart failure. Lasix 20 mg daily. Monitor for signs of fluid overload. Daily weights   Filed Weights   07/18/19 0536 07/20/19 0615  Weight: 83.7 kg 84.2 kg    10. Hypertension. Hydralazine 50 mg every 8 hours, Tenormin 25 mg daily, lisinopril  20 mg twice daily. borderline to fair control at present 11. Myasthenia gravis/progressive gait disorder. Patient on no medications. Follow-up outpatient neurologyDr. Krista Blue 12. Thrombocytopenia/elevated AST and total bilirubin. Likely due to cirrhosis/liver disease.    4/27 platelets steady at 116k, AST/bili about the same  4/29 recheck labs tomorrow 13. 4.6 cm ascending thoracic aortic aneurysm. Recommend semiannual imaging followed by CTA or MRA  (? referral to cardiothoracic surgery given above prognosis).    LOS: 3 days A FACE TO FACE EVALUATION WAS PERFORMED  Meredith Staggers 07/20/2019, 9:00 AM

## 2019-07-20 NOTE — IPOC Note (Signed)
Overall Plan of Care [facility]) Patient Details Name: Glen Finley MRN: [id number] DOB: Jan 09, 1935  Admitting Diagnosis: Malignant tumor of neck [facility])  Hospital Problems: Principal Problem:   Malignant tumor of neck ([place]) Active Problems:   Metabolic encephalopathy     Functional Problem List: Nursing Safety  PT Balance, Endurance, Safety  OT Balance, Cognition, Endurance, Motor, Safety  SLP    TR         Basic ADL's: OT Grooming, Bathing, Dressing, Toileting     Advanced  ADL's: OT       Transfers: PT Bed Mobility, Bed to Chair, Car, Manufacturing systems engineer, Metallurgist: PT Ambulation, Stairs     Additional Impairments: OT None  SLP Communication, Social Cognition expression Problem Solving, Memory  TR      Anticipated Outcomes Item Anticipated Outcome  Self Feeding n/a  Swallowing      Basic self-care  supervision  Toileting  supervision   Bathroom Transfers supervision  Bowel/Bladder  Patient will remain continent for remainder of stay  Transfers  S, LRAD  Locomotion  S, LRAD  Communication  Supervision  Cognition  Min A  Pain  Patient will continue to remain free for remainder of stay  Safety/Judgment  Patient will safely move and navigate himself for remainder of stay   Therapy Plan: PT Intensity: Minimum of 1-2 x/day ,45 to 90 minutes PT Frequency: 5 out of 7 days PT Duration Estimated Length of Stay: 7 days OT Intensity: Minimum of 1-2 x/day, 45 to 90 minutes OT Frequency: 5 out of 7 days OT Duration/Estimated Length of Stay: ~7-9 days SLP Intensity: Minumum of 1-2 x/day, 30 to 90 minutes SLP Frequency: 3 to 5 out of 7 days SLP Duration/Estimated Length of Stay: 5-7 days   Due to the current state of emergency, patients may not be receiving their 3-hours of Medicare-mandated therapy.   Team Interventions: Nursing Interventions    PT interventions Ambulation/gait training, Discharge planning, Functional mobility  training, Psychosocial support, Therapeutic Activities, Visual/perceptual remediation/compensation, Balance/vestibular training, Disease management/prevention, Neuromuscular re-education, Skin care/wound management, Therapeutic Exercise, Wheelchair propulsion/positioning, Cognitive remediation/compensation, DME/adaptive equipment instruction, Pain management, Splinting/orthotics, UE/LE Strength taining/ROM, Functional electrical stimulation, Community reintegration, Barrister's clerk education, IT trainer, UE/LE Coordination activities  OT Interventions Training and development officer, Engineer, drilling, Patient/family education, Therapeutic Activities, Cognitive remediation/compensation, Psychosocial support, Therapeutic Exercise, Community reintegration, Functional mobility training, Self Care/advanced ADL retraining, UE/LE Strength taining/ROM, Discharge planning, Neuromuscular re-education, Disease mangement/prevention, UE/LE Coordination activities, Pain management  SLP Interventions Cognitive remediation/compensation, Internal/external aids, Speech/Language facilitation, Therapeutic Activities, Environmental controls, Cueing hierarchy, Functional tasks, Patient/family education  TR Interventions    SW/CM Interventions Psychosocial Support, Patient/Family Education, Discharge Planning   Barriers to Discharge MD  Medical stability  Nursing Pending chemo/radiation    PT Other (comments) cognition- will need 24/7S for safety  OT      SLP      SW       Team Discharge Planning: Destination: PT-Home ,OT- Home , SLP-Home Projected Follow-up: PT-Home health PT, 24 hour supervision/assistance, OT-  Home health OT, SLP-Home Health SLP, 24 hour supervision/assistance Projected Equipment Needs: PT-Rolling walker with 5" wheels, 3 in 1 bedside comode, OT- To be determined, SLP-None recommended by SLP Equipment Details: PT- , OT-  Patient/family involved in discharge planning: PT-  Patient,  OT-Patient, SLP-Patient  MD ELOS: 6-8 days Medical Rehab Prognosis:  Good Assessment: The patient has been admitted for CIR therapies with the diagnosis of malignant tumor to neck with  subsequent debility. The team will be addressing functional mobility, strength, stamina, balance, safety, adaptive techniques and equipment, self-care, bowel and bladder mgt, patient and caregiver education, cognition, communication, dispo planning, palliative care. Goals have been set at supervision for mobility and self-care and supervision to min assist with cognition.   Due to the current state of emergency, patients may not be receiving their 3 hours per day of Medicare-mandated therapy.    Meredith Staggers, MD, FAAPMR      See Team Conference Notes for weekly updates to the plan of care

## 2019-07-20 NOTE — Progress Notes (Signed)
Physical Therapy Session Note  Patient Details  Name: Glen Finley MRN: [id number] Date of Birth: [birth year]  Today's Date: 07/20/2019 PT Individual Time: 1115-1200 PT Individual Time Calculation (min): 45 min   Short Term Goals: Week 1:  PT Short Term Goal 1 (Week 1): STG = LTGs due to ELOS Week 2:    Week 3:     Skilled Therapeutic Interventions/Progress Updates:    PAIN denies pain. STS w/cga to RW.    Gait 337ft w/RW including 8x turning towards L side, cga w/straight path, cga and cues to stay within walker w/turning as L foot tends to progress/remain outside of walker while turning. Significant L toe out gait which pt states is premorbid and relates to childhood injury.  Repeated STS x 18reps from slightly elevated mat for gross LE strengthening and balance w/transition.  Standing balance: Feet together - mild increased sway Feet toether w/cervical nods - cga mod increased sway Feet together w/cervical rotation - cga mod increased sway.  Sidestepping length of mat x 2 each direction w/cga, mild balance loss x 2 w/stepping stratetgy noted and recovers w/cga.  Functional gait: 183ft weaving around cones w/Rw and cga, stays within walker safely during this task.  Gait 58ft w/Rw Stopping at each cone and touching top of cone using RW for balance and cga for safety, unble to pick up cone/feels unsafe beyond touching cone.  At end of session pt returned to room via gait as follows: Gait 153ft, 76ft w/RW and cga w/seated rest break, gait pattern as above. Turns/sits to wc w/RW w/cga and cues for safe use of RW. Pt left oob in wc w/alarm belt set and needs in reach   Therapy Documentation Precautions:  Precautions Precautions: Fall Restrictions Weight Bearing Restrictions: No    Therapy/Group: Individual Therapy  Callie Fielding, [place] 07/20/2019, 12:10 PM

## 2019-07-20 NOTE — Progress Notes (Signed)
Speech Language Pathology Daily Session Note  Patient Details  Name: Glen Finley MRN: [id number] Date of Birth: 06-09-1934  Today's Date: 07/20/2019 SLP Individual Time: 1305-1400 SLP Individual Time Calculation (min): 55 min  Short Term Goals: Week 1: SLP Short Term Goal 1 (Week 1): STGs=LTGs due to ELOS  Skilled Therapeutic Interventions: Skilled treatment session focused on cognitive goals. SLP facilitated session by providing Mod A verbal and visual cues for functional problem solving during a basic money management task. Patient also recalled meeting with NP from palliative care and could give me a vague answers regarding the details of their conversation. Patient requested to return to bed at end of session and required verbal cues to utilize a controlled descent. Patient left supine in bed with alarm on and all needs within reach. Continue with current plan of care.     Pain No/Denies Pain   Therapy/Group: Individual Therapy  Satia Winger 07/20/2019, 3:23 PM

## 2019-07-20 NOTE — Progress Notes (Signed)
Occupational Therapy Session Note  Patient Details  Name: Glen Finley MRN: [id number] Date of Birth: 04-22-1934  Today's Date: 07/20/2019 OT Individual Time: 7342-8768 OT Individual Time Calculation (min): 44 min    Short Term Goals: Week 1:  OT Short Term Goal 1 (Week 1): STG=LTG  Skilled Therapeutic Interventions/Progress Updates:    1:1. Pt received in bed agreeable to OT after increased time to arouse from sleep. No pain reported pt completes ambulatory transfer into bathroom to void bladder and bowel on toilet seat with MIN A to power up from low position. Discussed home DME and pt reports having shower chair but no BSC. Edu re using BSC over toilet to improve independence with sit to stand from commode d/t higher surface. Pt agreeable. Pt completes ambulation to dayroom with CGA and 1 rest break with VC for looking ahead. Pt completes standing balance task at high low table while painting activity occurs standing on compliant surface. Pt requires CGA for no UE support on table and S with 1UE support. Exited session with tp seated in bed, exit alarm on and call light in reach  Therapy Documentation Precautions:  Precautions Precautions: Fall Restrictions Weight Bearing Restrictions: No General:   Vital Signs: Therapy Vitals Temp: 97.9 F (36.6 C) Temp Source: Oral Pulse Rate: 63 Resp: 16 BP: (!) 133/53 Patient Position (if appropriate): Lying Oxygen Therapy SpO2: 100 % O2 Device: Room Air Pain:   ADL: ADL Grooming: Setup, Minimal assistance Upper Body Bathing: Contact guard Where Assessed-Upper Body Bathing: Shower Lower Body Bathing: Moderate assistance Where Assessed-Lower Body Bathing: Shower Upper Body Dressing: Minimal assistance Where Assessed-Upper Body Dressing: Edge of bed Lower Body Dressing: Moderate assistance Where Assessed-Lower Body Dressing: Edge of bed Toileting: Other (Comment)(incontient) Where Assessed-Toileting: Glass blower/designer:  Psychiatric nurse Method: Optometrist: Walk in Facilities manager Transfer: Minimal assistance Vision   Perception    Praxis   Exercises:   Other Treatments:     Therapy/Group: Individual Therapy  Tonny Branch 07/20/2019, 2:40 PM

## 2019-07-20 NOTE — Progress Notes (Signed)
Occupational Therapy Session Note  Patient Details  Name: Glen Finley MRN: [id number] Date of Birth: Aug 25, 1934  Today's Date: 07/20/2019 OT Individual Time: 0915-1010 OT Individual Time Calculation (min): 55 min    Short Term Goals: Week 1:  OT Short Term Goal 1 (Week 1): STG=LTG  Skilled Therapeutic Interventions/Progress Updates:    1:1. Pt received in bed agreeable to OT and bathing/dressing. Pt completes supine>sitting EOB with S using bed rails. Pt completes all transfers at ambulatory level with RW and MIN A with VC for keeping feet inside RW and slowing turns to improve stability. Pt toilets with max VC for pulling down brief. Pt incontnent of urine in brief and voids ontoilet. Pt completes bathing sit to stand with MOD A for sit to stand to prevent slip d/t feet sliding forward when standing to wash buttocks. Pt dresses with improved initiation this date with set up for shirt/socks, S for standing balance to advance pants past hips, and VC for noticing donning shoes onto wrong feet. Pt plays connect 4 in standing with CGA for reaching/bending to grab pieces but requires MOD VC for strategy/game play. Exited session with pt seated in bed, exit alram on and call light in reach  Therapy Documentation Precautions:  Precautions Precautions: Fall Restrictions Weight Bearing Restrictions: No General:   Vital Signs: Therapy Vitals Temp: 98.7 F (37.1 C) Temp Source: Oral Pulse Rate: 68 Resp: 17 BP: (!) 145/61 Patient Position (if appropriate): Lying Oxygen Therapy SpO2: 92 % O2 Device: Room Air Pain:   ADL: ADL Grooming: Setup, Minimal assistance Upper Body Bathing: Contact guard Where Assessed-Upper Body Bathing: Shower Lower Body Bathing: Moderate assistance Where Assessed-Lower Body Bathing: Shower Upper Body Dressing: Minimal assistance Where Assessed-Upper Body Dressing: Edge of bed Lower Body Dressing: Moderate assistance Where Assessed-Lower Body Dressing:  Edge of bed Toileting: Other (Comment)(incontient) Where Assessed-Toileting: Glass blower/designer: Psychiatric nurse Method: Optometrist: Walk in Facilities manager Transfer: Minimal assistance Vision   Perception    Praxis   Exercises:   Other Treatments:     Therapy/Group: Individual Therapy  Tonny Branch 07/20/2019, 9:15 AM

## 2019-07-20 NOTE — Consult Note (Signed)
Consultation Note Date: 07/20/2019   Patient Name: Glen Finley  DOB: 1934-11-13  MRN: [id number]  Age / Sex: 85 y.o., male  PCP: Shon Baton, MD Referring Physician: Meredith Staggers, MD  Reason for Consultation: Establishing goals of care  HPI/Patient Profile: 85 y.o. male  with past medical history of HLD, myasthenia gravis, liver disease w/ recent hospitalization for encephalopathy, CHF, and bipolar disorder admitted on 07/17/2019 with to inpatient rehab following a hospitalization for fever and AMS. During that admission he was treated for sepsis. Further work up revealed a R neck mass. 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 Patient is not a candidate for systemic therapy or surgical resection. He and wife have declined radiation as they do not feel radiation would help with his quality of life after discussion with radiation oncology. PMT consulted for Glen Finley.  Clinical Assessment and Goals of Care: I have reviewed medical records including EPIC notes, labs and imaging, received report from RN, assessed the patient and then met with patient  to discuss diagnosis prognosis, GOC, EOL wishes, disposition and options.  Briefly spoke with patient - he is feeling well today. Eating well. He tells me about his family - wife of 77 years and 2 children, daughter lives very close and son in [place]. Tells me about his decision to avoid radiation; however he does plan to follow up with oncology as he is open to any treatments he is determined eligible for. He speaks at length about his diagnoses and seems to have good understanding. Glen Finley gives me permission to speak to his wife.   I called patient's wife, [name] - we had setup meeting in person to include patient but she is unable to come to the hospital today d/t her own illness.   I introduced Palliative Medicine as specialized medical care for people living  with serious illness. It focuses on providing relief from the symptoms and stress of a serious illness. The goal is to improve quality of life for both the patient and the family.  As far as functional and nutritional status, [name] tells me the patient is doing much better since he has been in rehab than he was doing at home. She tells me about his weakness and confusion at home although this seems to be much better now. She does share about poor appetite at home as well - but now eating good in rehab.    We discussed patient's current illness and what it means in the larger context of patient's on-going co-morbidities.  Natural disease trajectory and expectations at EOL were discussed. She describes Glen Finley medical condition accurately.   I attempted to elicit values and goals of care important to the patient.  [name] tells me that Glen Finley is interested in returning home and does not want to be burdened with excessive appointments or treatments with significant side effects.   We discussed hospice vs palliative services and the type of support provided at home by each service. Discussed limitations of palliative care - they do not come frequently like hospice. Ms. Horsch shares she is very familiar with hospice - she had a family member who passed away a month ago under hospice care. She tells me she is not yet ready for support of hospice. She is considering palliative care but would like to speak to Glen Finley about it.   I did briefly discuss code status  - described differences between full code and DNR. Discussed that Glen.  Finley is listed as full code. Discussed importance of speaking to Glen Finley about his wishes.   Ms Steinert shares she will call me back next week - she would like some time to consider our conversation and speak to Glen Finley about it.   Questions and concerns were addressed. The family was encouraged to call with questions or concerns.   Primary Decision Maker PATIENT joined by  wife as mental status seems to fluctuate  SUMMARY OF RECOMMENDATIONS   - discussed hospice vs palliative outpatient with wife - she is not interested in hospice, considering palliative - discussed code status briefly - wife will call me back next week - would like time to consider   Code Status/Advance Care Planning:  Full code   Symptom Management:   Patient denies any symptoms - tells me constipation is being treated by medical team in [facility]  Discharge Planning: Home with Home Health and palliative?     Primary Diagnoses: Present on Admission: . (Resolved) Malignant poorly differentiated neuroendocrine carcinoma ([place])   I have reviewed the medical record, interviewed the patient and family, and examined the patient. The following aspects are pertinent.  Past Medical History:  Diagnosis Date  . Abnormality of gait   . GERD (gastroesophageal reflux disease)   . Headache(784.0)   . Hyperlipidemia   . Hypertension   . Migraine   . Myasthenia gravis without exacerbation ([place])   . Obesity   . Other extrapyramidal disease and abnormal movement disorder   . REM sleep behavior disorder   . Unspecified psychosis   . Weakness    Social History   Socioeconomic History  . Marital status: Married    Spouse name: [name]  . Number of children: 2  . Years of education: 57  . Highest education level: Not on file  Occupational History  . Occupation: Network engineer.    Comment: Retired  Tobacco Use  . Smoking status: Never Smoker  . Smokeless tobacco: Never Used  Substance and Sexual Activity  . Alcohol use: No    Alcohol/week: 0.0 standard drinks  . Drug use: No  . Sexual activity: Never  Other Topics Concern  . Not on file  Social History Narrative   Patient lives at home with his wife [name]). Patient is retired. High school education.   Left handed.   Caffeine- One cup daily.   Social Determinants of Health   Financial Resource Strain:   . Difficulty of  Paying Living Expenses:   Food Insecurity:   . Worried About Charity fundraiser in the Last Year:   . Arboriculturist in the Last Year:   Transportation Needs:   . Film/video editor (Medical):   Marland Kitchen Lack of Transportation (Non-Medical):   Physical Activity:   . Days of Exercise per Week:   . Minutes of Exercise per Session:   Stress:   . Feeling of Stress :   Social Connections:   . Frequency of Communication with Friends and Family:   . Frequency of Social Gatherings with Friends and Family:   . Attends Religious Services:   . Active Member of Clubs or Organizations:   . Attends Archivist Meetings:   Marland Kitchen Marital Status:    No family history on file. Scheduled Meds: . amitriptyline  25 mg Oral QHS  . atenolol  25 mg Oral q AM  . escitalopram  10 mg Oral q AM  . furosemide  20 mg Oral Daily  .  hydrALAZINE  50 mg Oral Q8H  . lactulose  30 g Oral TID  . lisinopril  20 mg Oral BID   Continuous Infusions: PRN Meds:.sorbitol Allergies  Allergen Reactions  . Amlodipine Swelling and Other (See Comments)    CAUSED SEVERE SWELLING OF THE LEGS THAT REQUIRED A TRIP TO THE MD'S OFFICE  . Cherry Other (See Comments)    Migraines   . Onion Other (See Comments)    Can eat cooked onions ONLY (or, patient develops migraines)  . Codeine Rash   Review of Systems  All other systems reviewed and are negative.   Physical Exam Constitutional:      General: He is not in acute distress. Pulmonary:     Effort: Pulmonary effort is normal. No respiratory distress.  Musculoskeletal:     Right lower leg: No edema.     Left lower leg: No edema.  Skin:    General: Skin is warm and dry.  Neurological:     Mental Status: He is alert and oriented to person, place, and time.  Psychiatric:        Mood and Affect: Mood normal.        Behavior: Behavior normal.     Vital Signs: BP (!) 114/51   Pulse 68   Temp 98.7 F (37.1 C) (Oral)   Resp 17   Wt 84.2 kg   SpO2 92%   BMI  27.42 kg/m  Pain Scale: 0-10   Pain Score: 0-No pain   SpO2: SpO2: 92 % O2 Device:SpO2: 92 % O2 Flow Rate: .   IO: Intake/output summary:   Intake/Output Summary (Last 24 hours) at 07/20/2019 1343 Last data filed at 07/20/2019 0730 Gross per 24 hour  Intake 260 ml  Output --  Net 260 ml    LBM: Last BM Date: 07/18/19 Baseline Weight: Weight: 83.7 kg Most recent weight: Weight: 84.2 kg     Palliative Assessment/Data: PPS 50%   Flowsheet Rows     Most Recent Value  Intake Tab  Referral Department  Oncology  Unit at Time of Referral  Other (Comment) [CIR]  Palliative Care Primary Diagnosis  Cancer  Date Notified  07/18/19  Palliative Care Type  New Palliative care  Reason for referral  Clarify Goals of Care  Date of Admission  07/17/19  Date first seen by Palliative Care  07/20/19  # of days Palliative referral response time  2 Day(s)  # of days IP prior to Palliative referral  1  Clinical Assessment  Psychosocial & Spiritual Assessment  Palliative Care Outcomes      Time Total: 50 minutes Greater than 50%  of this time was spent counseling and coordinating care related to the above assessment and plan.  Juel Burrow, DNP, AGNP-C Palliative Medicine Team [phone] Pager: [phone]

## 2019-07-20 NOTE — Progress Notes (Signed)
Social Work Patient ID: Glen Finley, male   DOB: 1935-03-06, 85 y.o.   MRN: [id number]   SW met with pt in room to provide [facility] list ([email or web address]). Pt states that his wife was not feeling well and would not be in today. SW stated will follow-up with her.   SW called pt wife Glen Finley [phone]) to discuss recommended DME: 3i1n BSC and HHA preference. Wife confirms they do not have 3in1 BSC, SW to order. Preferred [facility]- [facility].   SW sent DME order to [facility] [place] via parachute. SW waiting on follow-up from [facility] on HHPT/OT/ST/CNA referral.   Loralee Pacas, MSW, [place] Office: [phone] Cell: [phone] Fax: [phone]

## 2019-07-21 ENCOUNTER — Inpatient Hospital Stay (HOSPITAL_COMMUNITY): Payer: Medicare Other

## 2019-07-21 ENCOUNTER — Inpatient Hospital Stay (HOSPITAL_COMMUNITY): Payer: Medicare Other | Admitting: Physical Therapy

## 2019-07-21 ENCOUNTER — Inpatient Hospital Stay (HOSPITAL_COMMUNITY): Payer: Medicare Other | Admitting: Speech Pathology

## 2019-07-21 LAB — COMPREHENSIVE METABOLIC PANEL
ALT: 27 U/L (ref 0–44)
AST: 61 U/L — ABNORMAL HIGH (ref 15–41)
Albumin: 2.1 g/dL — ABNORMAL LOW (ref 3.5–5.0)
Alkaline Phosphatase: 75 U/L (ref 38–126)
Anion gap: 8 (ref 5–15)
BUN: 11 mg/dL (ref 8–23)
CO2: 26 mmol/L (ref 22–32)
Calcium: 8.4 mg/dL — ABNORMAL LOW (ref 8.9–10.3)
Chloride: 104 mmol/L (ref 98–111)
Creatinine, Ser: 0.96 mg/dL (ref 0.61–1.24)
GFR calc Af Amer: >60 mL/min (ref >60)
GFR calc non Af Amer: >60 mL/min (ref >60)
Glucose, Bld: 89 mg/dL (ref 70–99)
Potassium: 3.7 mmol/L (ref 3.5–5.1)
Sodium: 138 mmol/L (ref 135–145)
Total Bilirubin: 1.8 mg/dL — ABNORMAL HIGH (ref 0.3–1.2)
Total Protein: 5.5 g/dL — ABNORMAL LOW (ref 6.5–8.1)

## 2019-07-21 LAB — CBC
HCT: 38.6 % — ABNORMAL LOW (ref 39.0–52.0)
Hemoglobin: 13.2 g/dL (ref 13.0–17.0)
MCH: 34.3 pg — ABNORMAL HIGH (ref 26.0–34.0)
MCHC: 34.2 g/dL (ref 30.0–36.0)
MCV: 100.3 fL — ABNORMAL HIGH (ref 80.0–100.0)
Platelets: 105 10*3/uL — ABNORMAL LOW (ref 150–400)
RBC: 3.85 MIL/uL — ABNORMAL LOW (ref 4.22–5.81)
RDW: 16.1 % — ABNORMAL HIGH (ref 11.5–15.5)
WBC: 6.6 10*3/uL (ref 4.0–10.5)
nRBC: 0 % (ref 0.0–0.2)

## 2019-07-21 LAB — AMMONIA
Ammonia: 56 umol/L — ABNORMAL HIGH (ref 9–35)
Ammonia: 27 umol/L (ref 9–35)

## 2019-07-21 MED ORDER — POTASSIUM CHLORIDE CRYS ER 20 MEQ PO TBCR
20.0000 meq | EXTENDED_RELEASE_TABLET | Freq: Every day | ORAL | Status: DC
  Administered 2019-07-21 – 2019-07-26 (×6): 20 meq via ORAL
  Filled 2019-07-21 (×6): qty 1

## 2019-07-21 MED ORDER — FUROSEMIDE 40 MG PO TABS
40.0000 mg | ORAL_TABLET | Freq: Every day | ORAL | Status: DC
  Administered 2019-07-22 – 2019-07-26 (×5): 40 mg via ORAL
  Filled 2019-07-21 (×5): qty 1

## 2019-07-21 MED ORDER — FUROSEMIDE 20 MG PO TABS
20.0000 mg | ORAL_TABLET | Freq: Once | ORAL | Status: AC
  Administered 2019-07-21: 20 mg via ORAL
  Filled 2019-07-21: qty 1

## 2019-07-21 MED ORDER — ENSURE MAX PROTEIN PO LIQD
11.0000 [oz_av] | Freq: Every day | ORAL | Status: DC
  Administered 2019-07-21 – 2019-07-26 (×4): 11 [oz_av] via ORAL
  Filled 2019-07-21 (×6): qty 330

## 2019-07-21 NOTE — Progress Notes (Signed)
Social Work Patient ID: Glen Finley, male   DOB: January 12, 1935, 85 y.o.   MRN: [id number]    HCPOA in patient chart.  Loralee Pacas, MSW, [place] Office: [phone] Cell: [phone] Fax: [phone]

## 2019-07-21 NOTE — Progress Notes (Addendum)
[facility] PHYSICAL MEDICINE & REHABILITATION PROGRESS NOTE   Subjective/Complaints:  pt up in bed. Says he feels ok but not as well as yesterday. SLP was concerned that he appeared more confused this morning.   ROS: Patient denies fever, rash, sore throat, blurred vision, nausea, vomiting, diarrhea, cough, shortness of breath or chest pain, joint or back pain, headache, or mood change.      Objective:   No results found. Recent Labs    07/21/19 0442  WBC 6.6  HGB 13.2  HCT 38.6*  PLT 105*   Recent Labs    07/21/19 0442  NA 138  K 3.7  CL 104  CO2 26  GLUCOSE 89  BUN 11  CREATININE 0.96  CALCIUM 8.4*    Intake/Output Summary (Last 24 hours) at 07/21/2019 0911 Last data filed at 07/21/2019 0800 Gross per 24 hour  Intake 600 ml  Output --  Net 600 ml     Physical Exam: Vital Signs Blood pressure (!) 142/51, pulse 66, temperature 98 F (36.7 C), temperature source Oral, resp. rate 18, weight 85.5 kg, SpO2 92 %. Constitutional: No distress . Vital signs reviewed. HEENT: EOMI, oral membranes moist Neck: supple, bruising around neck Cardiovascular: RRR without murmur. No JVD    Respiratory/Chest: CTA Bilaterally without wheezes or rales. Normal effort    GI/Abdomen: BS +, non-tender, non-distended Ext: no clubbing, cyanosis, tr LE edema Psych: pleasant and cooperative Musculoskeletal:  Cervical back: Normal range of motion.  Neurological: alert. Oriented to person, place. Follows basic commands. Decreased insight and awareness.  Motor 4/5 UE bilaterally. LE: 3+/5 HF, KE and 4/5 ADF/F. No focal sensory findings. DTR's trace to 1+in all 4's Skin: Skin iswarm.  .       Assessment/Plan: 1. 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  Physiatrist is providing close team supervision and 24 hour management of active medical problems  listed below.  Physiatrist and rehab team continue to assess barriers to discharge/monitor patient progress toward functional and medical goals  Care Tool:  Bathing    Body parts bathed by patient: Right arm, Left arm, Chest, Abdomen, Front perineal area, Buttocks, Right upper leg, Left upper leg, Right lower leg, Left lower leg, Face         Bathing assist Assist Level: Minimal Assistance - Patient > 75%     Upper Body Dressing/Undressing Upper body dressing   What is the patient wearing?: Pull over shirt    Upper body assist Assist Level: Minimal Assistance - Patient > 75%    Lower Body Dressing/Undressing Lower body dressing      What is the patient wearing?: Pants, Incontinence brief     Lower body assist Assist for lower body dressing: Moderate Assistance - Patient 50 - 74%     Toileting Toileting Toileting Activity did not occur (Clothing management and hygiene only): N/A (no void or bm)(incontient of BM)  Toileting assist Assist for toileting: Moderate Assistance - Patient 50 - 74%     Transfers Chair/bed transfer  Transfers assist     Chair/bed transfer assist level: Contact Guard/Touching assist     Locomotion Ambulation   Ambulation assist      Assist level: Contact Guard/Touching assist Assistive device: Walker-rolling Max distance: 150   Walk 10 feet activity   Assist     Assist level: Contact Guard/Touching assist Assistive device: Walker-rolling   Walk  50 feet activity   Assist    Assist level: Contact Guard/Touching assist Assistive device: Walker-rolling    Walk 150 feet activity   Assist    Assist level: Contact Guard/Touching assist Assistive device: Walker-rolling    Walk 10 feet on uneven surface  activity   Assist Walk 10 feet on uneven surfaces activity did not occur: Safety/medical concerns(fatigue)   Assist level: Contact Guard/Touching assist Assistive device: Horticulturist, commercial Will patient use wheelchair at discharge?: No   Wheelchair activity did not occur: N/A         Wheelchair 50 feet with 2 turns activity    Assist    Wheelchair 50 feet with 2 turns activity did not occur: N/A       Wheelchair 150 feet activity     Assist  Wheelchair 150 feet activity did not occur: N/A       Blood pressure (!) 142/51, pulse 66, temperature 98 F (36.7 C), temperature source Oral, resp. rate 18, weight 85.5 kg, SpO2 92 %.  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  -patient may shower -Tumor is not resectable. Oncology (Dr. Irene Limbo) following. Rad-Onc spoke to wife and decision was made that he would not want palliative XRT. Palliative care involved. Appreciate their help   -mental status seems to wax and wane a bit. He seemed close to baseline today 4/30  -ELOS 5/5 2. Antithrombotics: -DVT/anticoagulation:SCDs. Right upper extremity Doppler negative. -antiplatelet therapy: N/A 3. Pain Management:Currently on no Tylenol or pain medication 4. Mood:Lexapro 10 mg daily. teamProviding emotional support -antipsychotic agents: N/A 5. Neuropsych: This patientiscapable of making decisions on hisown behalf. 6. Skin/Wound Care:Routine skin checks 7. Fluids/Electrolytes/Nutrition:encourage po  -I personally reviewed the patient's labs today.    -protein supp for low albumin 8. Acute hepatic/metabolic encephalopathy. Continue lactulose 30 mg 3 times daily.   -cognitively waxes and wanes, some improvement. Baseline dysfunction likely  -4/30 follow up ammonia level today 56. Last level was 44   -recheck level Monday or if confusion worsens this weekend 9. Chronic diastolic congestive heart failure. Lasix 20 mg daily. Monitor for signs of  fluid overload. D  4/30 Daily weights trending up slightly, some edema LE's   -increase lasix to 40mg  daily   Filed Weights   07/18/19 0536 07/20/19 0615 07/21/19 0500  Weight: 83.7 kg 84.2 kg 85.5 kg    10. Hypertension. Hydralazine 50 mg every 8 hours, Tenormin 25 mg daily, lisinopril 20 mg twice daily. borderline to fair control 4/30 11. Myasthenia gravis/progressive gait disorder. Patient on no medications. Follow-up outpatient neurologyDr. Krista Blue 12. Thrombocytopenia/elevated AST and total bilirubin. Likely due to cirrhosis/liver disease.    4/27 platelets steady at 116k, AST/bili about the same  4/30 platelets holding in 105-115 range 13. 4.6 cm ascending thoracic aortic aneurysm. Recommend semiannual imaging followed by CTA or MRA  (? referral to cardiothoracic surgery given above prognosis).    LOS: 4 days A FACE TO FACE EVALUATION WAS PERFORMED  Meredith Staggers 07/21/2019, 9:11 AM

## 2019-07-21 NOTE — Progress Notes (Signed)
Physical Therapy Session Note  Patient Details  Name: Glen Finley MRN: [id number] Date of Birth: 11-Feb-1935  Today's Date: 07/21/2019 PT Individual Time: 1103-1594 PT Individual Time Calculation (min): 45 min   Short Term Goals: Week 1:  PT Short Term Goal 1 (Week 1): STG = LTGs due to ELOS  Skilled Therapeutic Interventions/Progress Updates:   Pt received supine in bed and agreeable to PT. Supine>sit transfer with supervision assist and min cues to reduce use of bed features. Pt reports need for BM. Ambulatory transfer to toilet with RW and supervision assist. Pt able to void bowel and bladder and perform pericare with supervision assist for safety. Hand hygiene at sink with supervision assist. Pt perform gait training 2 x 130f with supervision assist and RW. UBE, random resistance setting, x 5 min forward 5 min backward, cues to maintain constant speed to maximize  cardio respiratory benefits. Dynavision dynamic balance training Program A, 4 rings x 2(23, 27). Program B, R hand R, L hand green (no in correct selection, 24, 22). Pt returned to room and performed ambulatory transfer to bed with supervision A and RW. Sit>supine completed without assist, and left supine in bed with call bell in reach and all needs met.        Therapy Documentation Precautions:  Precautions Precautions: Fall Restrictions Weight Bearing Restrictions: No Vital Signs: Therapy Vitals Temp: 98.3 F (36.8 C) Temp Source: Oral Pulse Rate: 67 Resp: 17 BP: (!) 135/56 Patient Position (if appropriate): Lying Oxygen Therapy SpO2: 96 % O2 Device: Room Air Pain: denies   Therapy/Group: Individual Therapy  ALorie Phenix4/30/2021, 5:22 PM

## 2019-07-21 NOTE — Progress Notes (Signed)
Occupational Therapy Session Note  Patient Details  Name: Glen Finley MRN: [id number] Date of Birth: 09-Aug-1934  Today's Date: 07/21/2019 OT Individual Time: 2820-6015 OT Individual Time Calculation (min): 72 min    Short Term Goals: Week 1:  OT Short Term Goal 1 (Week 1): STG=LTG  Skilled Therapeutic Interventions/Progress Updates:    1;1. Pt received in bed with wife present. Pt with no pain and declines bathing and dressing this date. Pt agreeable to toileting. Pt supine>sitting EOB with S to don shoes and completes funtional mobility around room with CGA for initial sit to stand fading to S for mobility with VC for slow turning. Pt able to power up from low toilet with grab bar. tp changes shirt in standing with mod A d/t anterior LOB when pulling shirt overhead. Pt able to identify sitting as safer strategy to don shirt and completes with set up. S provided for standing grooming at sink with bleeding at gums noted today. Pt states this is typical occurrence per pt report. Pt completes all functional mobility to/from dayroom with CGA fading to S overall. Pt completes standing with no AD to match cards on vertical board reaching laterally in B directions with mod VC for locating 17/18 cards correctly. Pt completes item retrieval from the floor with reacher while amublating with RW to retrieve 10 bean bags and place in walker bag. Pt with improved RW positioning/proximity to object with cues for safety. Exite session with pt seated in bed, exit alarm on and call light in reach  Therapy Documentation Precautions:  Precautions Precautions: Fall Restrictions Weight Bearing Restrictions: No General:   Vital Signs:   Pain:   ADL: ADL Grooming: Setup, Minimal assistance Upper Body Bathing: Contact guard Where Assessed-Upper Body Bathing: Shower Lower Body Bathing: Moderate assistance Where Assessed-Lower Body Bathing: Shower Upper Body Dressing: Minimal assistance Where  Assessed-Upper Body Dressing: Edge of bed Lower Body Dressing: Moderate assistance Where Assessed-Lower Body Dressing: Edge of bed Toileting: Other (Comment)(incontient) Where Assessed-Toileting: Glass blower/designer: Psychiatric nurse Method: Optometrist: Walk in Facilities manager Transfer: Minimal assistance Vision   Perception    Praxis   Exercises:   Other Treatments:     Therapy/Group: Individual Therapy  Tonny Branch 07/21/2019, 1:00 PM

## 2019-07-21 NOTE — Plan of Care (Signed)
  Problem: RH BOWEL ELIMINATION Goal: RH STG MANAGE BOWEL WITH ASSISTANCE Description: STG Manage Bowel independently  Outcome: Progressing   Problem: RH SKIN INTEGRITY Goal: RH STG SKIN FREE OF INFECTION/BREAKDOWN Outcome: Progressing Goal: RH STG MAINTAIN SKIN INTEGRITY WITH ASSISTANCE Description: STG Maintain Skin Integrity with mod I assist  Outcome: Progressing   Problem: RH SAFETY Goal: RH STG ADHERE TO SAFETY PRECAUTIONS W/ASSISTANCE/DEVICE Description: STG Adhere to Safety Precautions With mod I assist and a walker  Outcome: Progressing Goal: RH STG DECREASED RISK OF FALL WITH ASSISTANCE Description: STG Decreased Risk of Fall With mod I assist Outcome: Progressing   Problem: RH KNOWLEDGE DEFICIT GENERAL Goal: RH STG INCREASE KNOWLEDGE OF SELF CARE AFTER HOSPITALIZATION Description: Patient will have knowledge of how to safely take care of himself after discharge  Outcome: Progressing   Problem: Consults Goal: RH GENERAL PATIENT EDUCATION Description: See Patient Education module for education specifics. Outcome: Progressing

## 2019-07-21 NOTE — Progress Notes (Signed)
Speech Language Pathology Daily Session Note  Patient Details  Name: Glen Finley MRN: [id number] Date of Birth: January 26, 1935  Today's Date: 07/21/2019 SLP Individual Time: 0820-0900 SLP Individual Time Calculation (min): 40 min  Short Term Goals: Week 1: SLP Short Term Goal 1 (Week 1): STGs=LTGs due to ELOS  Skilled Therapeutic Interventions: Skilled treatment session focused on cognitive goals. SLP facilitated session by providing supervision level verbal cues for recall of his current medications and their functions. However, Max verbal and visual cues were needed for problem solving while organizing a QD pill box. Patient with intermittent language of confusion regarding walking on the moon, roaches, etc. Patient intermittent aware of "saying weird things." MD aware. Patient left upright in bed with alarm on and all needs within reach. Continue with current plan of care.      Pain No/Denies Pain   Therapy/Group: Individual Therapy  Keilynn Marano 07/21/2019, 12:49 PM

## 2019-07-21 NOTE — Progress Notes (Signed)
Physical Therapy Session Note  Patient Details  Name: Glen Finley MRN: [id number] Date of Birth: 12-23-1934  Today's Date: 07/21/2019 PT Individual Time: 0900-1000 PT Individual Time Calculation (min): 60 min   Short Term Goals: Week 1:  PT Short Term Goal 1 (Week 1): STG = LTGs due to ELOS  Skilled Therapeutic Interventions/Progress Updates:   Pt received in supine and agreeable to therapy, denies pain. Supine>sit w/ supervision w/ increased time. Pt threaded pants and donned shoes w/ supervision and verbal cues for technique. Sit>stand w/ CGA and pt pulled pants over hips w/o assist. Ambulated to/from toilet w/ CGA using RW, toilet transfer w/ CGA as well. Ambulated to/from therapy gym and around unit w/ CGA w/ RW, 150-250' at a time. Verbal and tactile cues for safe RW management. Worked on gait training w/o AD and w/ quad cane (he used quad cane PTA), pt's gait pattern slightly improved w/o AD, however needed min assist for upright balance. Pt unsafe w/ quad cane as he leans to R (holds cane on L) and is unable to correct. Plus, his excessive L hip ER prevents safe quad cane use on L. Worked on gait training w/ RW while performing dynamic tasks including ambulating to pick up cups from floor w/ min assist and weaving around cones w/ CGA. Verbal and visual cues for safe RW management and how to squat to reach safely. Performed NuStep 5 min @ level 4 w/ all extremities to work on endurance and global strengthening. Pt ambulated back to room w/ CGA. Ended session in w/c, all needs in reach.   Therapy Documentation Precautions:  Precautions Precautions: Fall Restrictions Weight Bearing Restrictions: No  Therapy/Group: Individual Therapy  Porchea Charrier K Kymiah Araiza 07/21/2019, 10:05 AM

## 2019-07-22 DIAGNOSIS — K7290 Hepatic failure, unspecified without coma: Secondary | ICD-10-CM

## 2019-07-22 DIAGNOSIS — R7401 Elevation of levels of liver transaminase levels: Secondary | ICD-10-CM

## 2019-07-22 DIAGNOSIS — C799 Secondary malignant neoplasm of unspecified site: Secondary | ICD-10-CM

## 2019-07-22 DIAGNOSIS — D696 Thrombocytopenia, unspecified: Secondary | ICD-10-CM

## 2019-07-22 DIAGNOSIS — R0989 Other specified symptoms and signs involving the circulatory and respiratory systems: Secondary | ICD-10-CM

## 2019-07-22 DIAGNOSIS — K7682 Hepatic encephalopathy: Secondary | ICD-10-CM

## 2019-07-22 DIAGNOSIS — C7A1 Malignant poorly differentiated neuroendocrine tumors: Principal | ICD-10-CM

## 2019-07-22 DIAGNOSIS — I5032 Chronic diastolic (congestive) heart failure: Secondary | ICD-10-CM

## 2019-07-22 NOTE — Progress Notes (Signed)
[facility] PHYSICAL MEDICINE & REHABILITATION PROGRESS NOTE   Subjective/Complaints: Patient seen sitting up in bed this morning.  He states he slept well overnight.  He denies complaints.  No reported issues overnight.  ROS: Denies CP, SOB, N/V/D  Objective:   No results found. Recent Labs    07/21/19 0442  WBC 6.6  HGB 13.2  HCT 38.6*  PLT 105*   Recent Labs    07/21/19 0442  NA 138  K 3.7  CL 104  CO2 26  GLUCOSE 89  BUN 11  CREATININE 0.96  CALCIUM 8.4*    Intake/Output Summary (Last 24 hours) at 07/22/2019 0940 Last data filed at 07/22/2019 0700 Gross per 24 hour  Intake 830 ml  Output 125 ml  Net 705 ml     Physical Exam: Vital Signs Blood pressure (!) 163/65, pulse 67, temperature 98.2 F (36.8 C), temperature source Oral, resp. rate 16, weight 86 kg, SpO2 95 %. Constitutional: No distress . Vital signs reviewed. HENT: Normocephalic.  Atraumatic. Eyes: EOMI. No discharge. Cardiovascular: No JVD. Respiratory: Normal effort.  No stridor. GI: Non-distended. Skin: Warm and dry.  Intact. Psych: Normal mood.  Normal behavior. Musc: No edema in extremities.  No tenderness in extremities. Neuro: Alert Motor: 4+/5 throughout  Assessment/Plan: 1. 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  Physiatrist is providing close team supervision and 24 hour management of active medical problems listed below.  Physiatrist and rehab team continue to assess barriers to discharge/monitor patient progress toward functional and medical goals  Care Tool:  Bathing    Body parts bathed by patient: Right arm, Left arm, Chest, Abdomen, Front perineal area, Buttocks, Right upper leg, Left upper leg, Right lower leg, Left lower leg, Face         Bathing assist Assist Level: Minimal Assistance - Patient > 75%     Upper Body  Dressing/Undressing Upper body dressing   What is the patient wearing?: Pull over shirt    Upper body assist Assist Level: Minimal Assistance - Patient > 75%    Lower Body Dressing/Undressing Lower body dressing      What is the patient wearing?: Pants, Incontinence brief     Lower body assist Assist for lower body dressing: Moderate Assistance - Patient 50 - 74%     Toileting Toileting Toileting Activity did not occur (Clothing management and hygiene only): N/A (no void or bm)(incontient of BM)  Toileting assist Assist for toileting: Moderate Assistance - Patient 50 - 74%     Transfers Chair/bed transfer  Transfers assist     Chair/bed transfer assist level: Contact Guard/Touching assist     Locomotion Ambulation   Ambulation assist      Assist level: Contact Guard/Touching assist Assistive device: Walker-rolling Max distance: 250'   Walk 10 feet activity   Assist     Assist level: Contact Guard/Touching assist Assistive device: Walker-rolling   Walk 50 feet activity   Assist    Assist level: Contact Guard/Touching assist Assistive device: Walker-rolling    Walk 150 feet activity   Assist    Assist level: Contact Guard/Touching assist Assistive device: Walker-rolling    Walk 10 feet on uneven surface  activity   Assist Walk 10 feet on uneven surfaces activity did not occur: Safety/medical concerns(fatigue)   Assist level: Contact Guard/Touching assist Assistive device: Aeronautical engineer  Will patient use wheelchair at discharge?: No   Wheelchair activity did not occur: N/A         Wheelchair 50 feet with 2 turns activity    Assist    Wheelchair 50 feet with 2 turns activity did not occur: N/A       Wheelchair 150 feet activity     Assist  Wheelchair 150 feet activity did not occur: N/A       Blood pressure (!) 163/65, pulse 67, temperature 98.2 F (36.8 C), temperature source  Oral, resp. rate 16, weight 86 kg, SpO2 95 %.  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  Tumor is not resectable. Oncology (Dr. Irene Limbo) following. Rad-Onc spoke to wife and decision was made that he would not want palliative XRT. Palliative care involved. Appreciate recs  Continue CIR  2. Antithrombotics: -DVT/anticoagulation:SCDs. Right upper extremity Doppler negative. -antiplatelet therapy: N/A 3. Pain Management:Currently on no Tylenol or pain medication 4. Mood:Lexapro 10 mg daily. teamProviding emotional support -antipsychotic agents: N/A 5. Neuropsych: This patientis ?capable of making decisions on hisown behalf. 6. Skin/Wound Care:Routine skin checks 7. Fluids/Electrolytes/Nutrition:encourage po  -protein supp for low albumin 8. Acute hepatic/metabolic encephalopathy. Continue lactulose 30 mg 3 times daily.   -cognitively waxes and wanes, some improvement. Baseline dysfunction likely  Ammonia 27 on 4/30 9. Chronic diastolic congestive heart failure. Lasix 20 mg daily. Monitor for signs of fluid overload. D Filed Weights   07/20/19 0615 07/21/19 0500 07/22/19 0600  Weight: 84.2 kg 85.5 kg 86 kg    Trending up on 5/1, Lasix increased on 5/1 10. Hypertension. Hydralazine 50 mg every 8 hours, Tenormin 25 mg daily, lisinopril 20 mg twice daily.   Labile on 5/1, continue to monitor 11. Myasthenia gravis/progressive gait disorder. Patient on no medications. Follow-up outpatient neurologyDr. Krista Blue 12. Thrombocytopenia/elevated AST and total bilirubin. Likely due to cirrhosis/liver disease.    Platelets 105 on 4/30, continue to monitor  LFT slightly elevated on 4/30, continue to monitor 13. 4.6 cm ascending thoracic aortic aneurysm. Recommend semiannual imaging followed by CTA or MRA  (? referral to  cardiothoracic surgery given above prognosis).    LOS: 5 days A FACE TO FACE EVALUATION WAS PERFORMED  Avishai Reihl Lorie Phenix 07/22/2019, 9:40 AM

## 2019-07-23 ENCOUNTER — Inpatient Hospital Stay (HOSPITAL_COMMUNITY): Payer: Medicare Other

## 2019-07-23 ENCOUNTER — Inpatient Hospital Stay (HOSPITAL_COMMUNITY): Payer: Medicare Other | Admitting: Speech Pathology

## 2019-07-23 NOTE — Progress Notes (Signed)
Physical Therapy Session Note  Patient Details  Name: Glen Finley MRN: [id number] Date of Birth: 10-16-1934  Today's Date: 07/23/2019 PT Individual Time: 9381-0175; 1025-8527 PT Individual Time Calculation (min): 60 min , 74 min  Short Term Goals: Week 1:  PT Short Term Goal 1 (Week 1): STG = LTGs due to ELOS  Skilled Therapeutic Interventions/Progress Updates:  tx 1:  Pt resting in w/c; he denied pain.  Toilet transfer with min assist. Pt continent of bladder on toilet.  Hand washing with supervision from w/c level.  Sit> stand with immediate LOB backwards, limited by LEs against firm mat table and min assist from PT; bil HCs noted to be tight.  Pt described a childhool incident which damaged R knee and ankle, causing R knee to buckle occasionally throughout his life.  Standing balance retraining standing with forefeet on wedge with R UE support x 3 minutes during L hand activity on table in front of him.  Sit>< stand training for transferring wt forward and reaching back for seat with 1UE, x 3.  Standing x 3 minutes during R/L hand activities requiring trunk flexion and rotation with contralateral UE support; no LOB.  Gait training with RW through obstacle course of cones, min assist for balance, VCs throughout for safe foot placement as he turned RW.  Pt noted to have poor foot mobility bil, and excessive L hip external rotation.  Gait x 150' with RW, min /CGA to return to room, multiple turns; cues as above for safety of feet during turns with RW.  At end of session, pt seated in w/c with seat belt alarm set and needs at hand.  tx 2:  Wife present.  She had bought pt shoes.  She confirmed that pt had a hx of R knee buckling for many years.  Pt seated in w/c and denied pain.  Pt and wife wanted him to wear street clothes.  Pt doffed gown and donned shirt independently.  He needed extra time and min assist for problem- solving pants, which had 1 leg inside the other.  He donned socks  and shoes with min assist in sitting.  Sit> stand iwht close supervision and cues for safe use of RW; pt pulled up pants with supervision.  neuromuscular re-education via forced use, mirror feedback and VCs for trunk symmetry during use of Kinetron in sitting with bil UE support> 0UE support at level 50 cm/sec x 4 minutes; in standing with bil UE support > 1UE> 0UE support at level 50 cm/sec x 3 min x 2.  Pt has limited wt shift to L unless cued.  PT raised RW height 1 inch to facilitate upright trunk; pt stated that this felt better.  Gait training iwht RW on level tile x 250' with turns to L, close supervision except for stand> sit with min assist due to LOB R.  Pt appears to have scoliosis, thoracic C curve, convexity L. He may benefit from a heel lift in R shoe to address over-shifting to RLE in standing.   At end of session, pt resting in w/c with needs at hand and seat belt alarm set; wife present.     Therapy Documentation Precautions:  Precautions Precautions: Fall Restrictions Weight Bearing Restrictions: No        Therapy/Group: Individual Therapy  Junious Ragone 07/23/2019, 2:12 PM

## 2019-07-23 NOTE — Progress Notes (Signed)
Glen Finley PHYSICAL MEDICINE & REHABILITATION PROGRESS NOTE   Subjective/Complaints: Patient seen sitting up in bed this morning, having eaten breakfast.  He states he slept well overnight.  No reported issues overnight.  He denies complaints.  ROS: Denies CP, SOB, N/V/D  Objective:   No results found. Recent Labs    07/21/19 0442  WBC 6.6  HGB 13.2  HCT 38.6*  PLT 105*   Recent Labs    07/21/19 0442  NA 138  K 3.7  CL 104  CO2 26  GLUCOSE 89  BUN 11  CREATININE 0.96  CALCIUM 8.4*    Intake/Output Summary (Last 24 hours) at 07/23/2019 0744 Last data filed at 07/22/2019 2100 Gross per 24 hour  Intake 600 ml  Output 100 ml  Net 500 ml     Physical Exam: Vital Signs Blood pressure 133/61, pulse 63, temperature 97.8 F (36.6 C), temperature source Oral, resp. rate 18, weight 83.9 kg, SpO2 93 %. Constitutional: No distress . Vital signs reviewed. HENT: Normocephalic.  Atraumatic. Eyes: EOMI. No discharge. Cardiovascular: No JVD. Respiratory: Normal effort.  No stridor. GI: Non-distended. Skin: Warm and dry.  Intact. Psych: Normal mood.  Normal behavior. Musc: No edema in extremities.  No tenderness in extremities. Neuro: Alert Motor: 4+/5 throughout, unchanged  Assessment/Plan: 1. 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  Physiatrist is providing close team supervision and 24 hour management of active medical problems listed below.  Physiatrist and rehab team continue to assess barriers to discharge/monitor patient progress toward functional and medical goals  Care Tool:  Bathing    Body parts bathed by patient: Right arm, Left arm, Chest, Abdomen, Front perineal area, Buttocks, Right upper leg, Left upper leg, Right lower leg, Left lower leg, Face         Bathing assist Assist Level: Minimal Assistance - Patient > 75%      Upper Body Dressing/Undressing Upper body dressing   What is the patient wearing?: Pull over shirt    Upper body assist Assist Level: Minimal Assistance - Patient > 75%    Lower Body Dressing/Undressing Lower body dressing      What is the patient wearing?: Pants, Incontinence brief     Lower body assist Assist for lower body dressing: Moderate Assistance - Patient 50 - 74%     Toileting Toileting Toileting Activity did not occur (Clothing management and hygiene only): N/A (no void or bm)(incontient of BM)  Toileting assist Assist for toileting: Moderate Assistance - Patient 50 - 74%     Transfers Chair/bed transfer  Transfers assist     Chair/bed transfer assist level: Contact Guard/Touching assist     Locomotion Ambulation   Ambulation assist      Assist level: Contact Guard/Touching assist Assistive device: Walker-rolling Max distance: 250'   Walk 10 feet activity   Assist     Assist level: Contact Guard/Touching assist Assistive device: Walker-rolling   Walk 50 feet activity   Assist    Assist level: Contact Guard/Touching assist Assistive device: Walker-rolling    Walk 150 feet activity   Assist    Assist level: Contact Guard/Touching assist Assistive device: Walker-rolling    Walk 10 feet on uneven surface  activity   Assist Walk 10 feet on uneven surfaces activity did not occur: Safety/medical concerns(fatigue)   Assist level: Contact Guard/Touching assist Assistive device: Chemical engineer  Assist Will patient use wheelchair at discharge?: No   Wheelchair activity did not occur: N/A         Wheelchair 50 feet with 2 turns activity    Assist    Wheelchair 50 feet with 2 turns activity did not occur: N/A       Wheelchair 150 feet activity     Assist  Wheelchair 150 feet activity did not occur: N/A       Blood pressure 133/61, pulse 63, temperature 97.8 F (36.6 C), temperature  source Oral, resp. rate 18, weight 83.9 kg, SpO2 93 %.  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  Tumor is not resectable. Oncology (Dr. Irene Limbo) following. Rad-Onc spoke to wife and decision was made that he would not want palliative XRT. Palliative care involved. Appreciate recs  Continue CIR  2. Antithrombotics: -DVT/anticoagulation:SCDs. Right upper extremity Doppler negative. -antiplatelet therapy: N/A 3. Pain Management:Currently on no Tylenol or pain medication 4. Mood:Lexapro 10 mg daily. teamProviding emotional support -antipsychotic agents: N/A 5. Neuropsych: This patientis ?capable of making decisions on hisown behalf. 6. Skin/Wound Care:Routine skin checks 7. Fluids/Electrolytes/Nutrition:encourage po  -protein supp for low albumin 8. Acute hepatic/metabolic encephalopathy. Continue lactulose 30 mg 3 times daily.   -cognitively waxes and wanes, some improvement. Baseline dysfunction likely  Ammonia 27 on 4/30 9. Chronic diastolic congestive heart failure.  Lasix 20 mg daily. Monitor for signs of fluid overload.  Filed Weights   07/21/19 0500 07/22/19 0600 07/23/19 0441  Weight: 85.5 kg 86 kg 83.9 kg    ?  Reliability on 5/2 10. Hypertension. Hydralazine 50 mg every 8 hours, Tenormin 25 mg daily, lisinopril 20 mg twice daily.   Controlled on 5/2 11. Myasthenia gravis/progressive gait disorder. Patient on no medications. Follow-up outpatient neurologyDr. Krista Blue 12. Thrombocytopenia/elevated AST and total bilirubin. Likely due to cirrhosis/liver disease.    Platelets 105 on 4/30, continue to monitor  LFT slightly elevated on 4/30, continue to monitor 13. 4.6 cm ascending thoracic aortic aneurysm. Recommend semiannual imaging followed by CTA or MRA  (? referral to cardiothoracic surgery given  above prognosis).    LOS: 6 days A FACE TO FACE EVALUATION WAS PERFORMED  Lachlan Pelto Lorie Phenix 07/23/2019, 7:44 AM

## 2019-07-23 NOTE — Progress Notes (Signed)
Speech Language Pathology Daily Session Note  Patient Details  Name: Glen Finley MRN: [id number] Date of Birth: 12-21-1934  Today's Date: 07/23/2019 SLP Individual Time: 0937-1020 SLP Individual Time Calculation (min): 43 min  Short Term Goals: Week 1: SLP Short Term Goal 1 (Week 1): STGs=LTGs due to ELOS  Skilled Therapeutic Interventions:  Pt was seen for skilled ST targeting cognitive goals.  Upon arrival, pt was resting in bed but was agreeable to participating in therapy and requested to use the bathroom when asked in anticipation of needing to void despite not having an acute urge.  Pt continent while seated on the commode and ambulated back to wheelchair with min assist for safety.  Pt recalled his specific discharge date with supervision question cues and was able to generate one personal goal for his remaining time on CIR with min question cues.  Pt was overall appropriate during conversations with therapist; however, he had 2-3 moments where his output became slightly confused and tangential and needed min cues to return to topic at hand.  Pt was left in wheelchair with chair alarm set and call bell within reach.  Continue per current plan of care.    Pain Pain Assessment Pain Scale: 0-10 Pain Score: 0-No pain  Therapy/Group: Individual Therapy  Sinead Hockman, Selinda Orion 07/23/2019, 12:12 PM

## 2019-07-24 ENCOUNTER — Inpatient Hospital Stay (HOSPITAL_COMMUNITY): Payer: Medicare Other

## 2019-07-24 ENCOUNTER — Inpatient Hospital Stay (HOSPITAL_COMMUNITY): Payer: Medicare Other | Admitting: Occupational Therapy

## 2019-07-24 ENCOUNTER — Inpatient Hospital Stay (HOSPITAL_COMMUNITY): Payer: Medicare Other | Admitting: Speech Pathology

## 2019-07-24 LAB — BASIC METABOLIC PANEL
Anion gap: 7 (ref 5–15)
BUN: 14 mg/dL (ref 8–23)
CO2: 29 mmol/L (ref 22–32)
Calcium: 8.8 mg/dL — ABNORMAL LOW (ref 8.9–10.3)
Chloride: 104 mmol/L (ref 98–111)
Creatinine, Ser: 1.15 mg/dL (ref 0.61–1.24)
GFR calc Af Amer: >60 mL/min (ref >60)
GFR calc non Af Amer: 59 mL/min — ABNORMAL LOW (ref >60)
Glucose, Bld: 93 mg/dL (ref 70–99)
Potassium: 3.5 mmol/L (ref 3.5–5.1)
Sodium: 140 mmol/L (ref 135–145)

## 2019-07-24 LAB — CBC
HCT: 41.1 % (ref 39.0–52.0)
Hemoglobin: 14.2 g/dL (ref 13.0–17.0)
MCH: 34.1 pg — ABNORMAL HIGH (ref 26.0–34.0)
MCHC: 34.5 g/dL (ref 30.0–36.0)
MCV: 98.8 fL (ref 80.0–100.0)
Platelets: 91 10*3/uL — ABNORMAL LOW (ref 150–400)
RBC: 4.16 MIL/uL — ABNORMAL LOW (ref 4.22–5.81)
RDW: 15.6 % — ABNORMAL HIGH (ref 11.5–15.5)
WBC: 6.5 10*3/uL (ref 4.0–10.5)
nRBC: 0 % (ref 0.0–0.2)

## 2019-07-24 LAB — AMMONIA: Ammonia: 33 umol/L (ref 9–35)

## 2019-07-24 NOTE — Progress Notes (Signed)
Speech Language Pathology Daily Session Note  Patient Details  Name: Glen Finley MRN: [id number] Date of Birth: [birth year]  Today's Date: 07/24/2019 SLP Individual Time: 44-1430 SLP Individual Time Calculation (min): 45 min  Short Term Goals: Week 1: SLP Short Term Goal 1 (Week 1): STGs=LTGs due to ELOS  Skilled Therapeutic Interventions: Skilled treatment session focused on cognitive goals. SLP facilitated session by re-administering the Cognistat. Patient scored WFL on all subtests with the exception of mild impairments on the visual-construction subtest. Patient left upright in the wheelchair with the alarm on and all needs within reach. Continue with current plan of care.      Pain No/Denies Pain   Therapy/Group: Individual Therapy  Taquilla Downum 07/24/2019, 3:20 PM

## 2019-07-24 NOTE — Progress Notes (Signed)
Physical Therapy Session Note  Patient Details  Name: LONI ABDON MRN: [id number] Date of Birth: 1934/07/24  Today's Date: 07/24/2019 PT Individual Time: 0916-1000 PT Individual Time Calculation (min): 44 min   Short Term Goals: Week 1:  PT Short Term Goal 1 (Week 1): STG = LTGs due to ELOS  Skilled Therapeutic Interventions/Progress Updates:     Patient in w/c in room upon PT arrival. Patient alert and agreeable to PT session. Patient denied pain during session. Asked patient about concerns about upcoming d/c, patient stated that he wants to make sure his wife can "handle" him. Discussed scheduling family education session with patient and rehab team. Plan for family edu tomorrow morning.   Therapeutic Activity: Transfers: Patient performed sit to/from stand x5 using a RW with close supervision-CGA for safety due to mild posterior lean in standing. Provided verbal cues for forward weight shift into his toes to reduce posterior lean and reaching back to sit for safety.  Gait Training:  Patient ambulated 160 feet x2 using RW with close supervision and CGA on turns for safety due to intermittent posterior LOB while turning. Ambulated with decreased gait speed, decreased step length and height, increased B hip and knee flexion in stance, forward trunk lean, and downward head gaze. Provided verbal cues for erect posture, staying close to the RW with demonstration for "hips between hands," and increased step height for safety. Patient ascended/descended 4 steps using R rail with B UE support with CGA and increased time. Performed step-to gait pattern leading with R while ascending and L while descend per patient's prefrence. Provided cues for technique and sequencing.   Patient with limited activity tolerance during session, required seated rest breaks between activities. Provided education on home safety, 24/7 supervision for safety with mobility, fall risk/prevention, and energy conservation  techniques during rest breaks.  Patient in w/c in the room at end of session with breaks locked, seat belt alarm set, and all needs within reach.    Therapy Documentation Precautions:  Precautions Precautions: Fall Restrictions Weight Bearing Restrictions: No    Therapy/Group: Individual Therapy  Amyjo Mizrachi L Jamieson Hetland PT, DPT  07/24/2019, 3:54 PM

## 2019-07-24 NOTE — Progress Notes (Addendum)
Social Work Patient ID: Glen Finley, male   DOB: March 25, 1934, 85 y.o.   MRN: [id number]   HHPT/OT/ST/CNA referral declined by Docia Chuck. SW waiting on follow-up with Tiffany/[facility].  *Referral declined.   SW waiting on follow up form Britney Barnette/[facility], Carolyn Caldwell/[facility], Cheryl Rose/[facility], and Cassie/[facility] about HH erferral.  *Referrals declined by [facility] and [facility]. [facility] unable to accept due to insurance.   SW waiting on follow-up from Sarah/[facility] [phone]).   Loralee Pacas, MSW, [place] Office: [phone] Cell: [phone] Fax: [phone]

## 2019-07-24 NOTE — Progress Notes (Signed)
[place] PHYSICAL MEDICINE & REHABILITATION PROGRESS NOTE   Subjective/Complaints: No new complaints today. Denies pain. Seems to have had a good night  ROS: Patient denies fever, rash, sore throat, blurred vision, nausea, vomiting, diarrhea, cough, shortness of breath or chest pain, joint or back pain, headache, or mood change.    Objective:   No results found. Recent Labs    07/24/19 0545  WBC 6.5  HGB 14.2  HCT 41.1  PLT 91*   Recent Labs    07/24/19 0545  NA 140  K 3.5  CL 104  CO2 29  GLUCOSE 93  BUN 14  CREATININE 1.15  CALCIUM 8.8*    Intake/Output Summary (Last 24 hours) at 07/24/2019 1014 Last data filed at 07/23/2019 2028 Gross per 24 hour  Intake 240 ml  Output 125 ml  Net 115 ml     Physical Exam: Vital Signs Blood pressure 131/65, pulse 64, temperature 98.8 F (37.1 C), temperature source Oral, resp. rate 18, weight 84.1 kg, SpO2 90 %. Constitutional: No distress . Vital signs reviewed. HEENT: EOMI, oral membranes moist Neck: supple Cardiovascular: RRR without murmur. No JVD    Respiratory/Chest: CTA Bilaterally without wheezes or rales. Normal effort    GI/Abdomen: BS +, non-tender, non-distended Ext: no clubbing, cyanosis, or edema Psych: pleasant and cooperative Skin: Warm and dry.  Intact. Musc: No edema in extremities.  No tenderness in extremities. Neuro: Alert Motor: 4+/5 throughout, unchanged  Assessment/Plan: 1. 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  Physiatrist is providing close team supervision and 24 hour management of active medical problems listed below.  Physiatrist and rehab team continue to assess barriers to discharge/monitor patient progress toward functional and medical goals  Care Tool:  Bathing    Body parts bathed by patient: Right arm, Left arm, Chest, Abdomen, Front perineal  area, Buttocks, Right upper leg, Left upper leg, Right lower leg, Left lower leg, Face         Bathing assist Assist Level: Minimal Assistance - Patient > 75%     Upper Body Dressing/Undressing Upper body dressing   What is the patient wearing?: Pull over shirt    Upper body assist Assist Level: Minimal Assistance - Patient > 75%    Lower Body Dressing/Undressing Lower body dressing      What is the patient wearing?: Pants, Incontinence brief     Lower body assist Assist for lower body dressing: Moderate Assistance - Patient 50 - 74%     Toileting Toileting Toileting Activity did not occur (Clothing management and hygiene only): N/A (no void or bm)(incontient of BM)  Toileting assist Assist for toileting: Moderate Assistance - Patient 50 - 74%     Transfers Chair/bed transfer  Transfers assist     Chair/bed transfer assist level: Contact Guard/Touching assist     Locomotion Ambulation   Ambulation assist      Assist level: Minimal Assistance - Patient > 75% Assistive device: Walker-rolling Max distance: 150   Walk 10 feet activity   Assist     Assist level: Contact Guard/Touching assist Assistive device: Walker-rolling   Walk 50 feet activity   Assist    Assist level: Contact Guard/Touching assist Assistive device: Walker-rolling    Walk 150 feet activity   Assist    Assist level: Contact Guard/Touching assist Assistive device: Walker-rolling    Walk 10 feet on uneven surface  activity   Assist Walk 10 feet on uneven surfaces activity did not occur: Safety/medical concerns(fatigue)   Assist level: Contact Guard/Touching assist Assistive device: Aeronautical engineer Will patient use wheelchair at discharge?: No   Wheelchair activity did not occur: N/A         Wheelchair 50 feet with 2 turns activity    Assist    Wheelchair 50 feet with 2 turns activity did not occur: N/A       Wheelchair  150 feet activity     Assist  Wheelchair 150 feet activity did not occur: N/A       Blood pressure 131/65, pulse 64, temperature 98.8 F (37.1 C), temperature source Oral, resp. rate 18, weight 84.1 kg, SpO2 90 %.  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  Tumor is not resectable. Oncology (Dr. Irene Limbo) following. Rad-Onc spoke to wife and decision was made that he would not want palliative XRT. Palliative care involved. Appreciate recs  Continue CIR  2. Antithrombotics: -DVT/anticoagulation:SCDs. Right upper extremity Doppler negative. -antiplatelet therapy: N/A 3. Pain Management:Currently on no Tylenol or pain medication 4. Mood:Lexapro 10 mg daily. teamProviding emotional support -antipsychotic agents: N/A 5. Neuropsych: This patientis ?capable of making decisions on hisown behalf. 6. Skin/Wound Care:Routine skin checks 7. Fluids/Electrolytes/Nutrition:encourage po  -protein supp for low albumin 8. Acute hepatic/metabolic encephalopathy. Continue lactulose 30 mg 3 times daily.   -cognitively waxes and wanes, some improvement. Baseline dysfunction likely  Ammonia holding at 33 5/3---seems to range from 25-60 9. Chronic diastolic congestive heart failure.  Lasix 20 mg daily. Monitor for signs of fluid overload.  Filed Weights   07/22/19 0600 07/23/19 0441 07/24/19 0538  Weight: 86 kg 83.9 kg 84.1 kg    ?stable 10. Hypertension. Hydralazine 50 mg every 8 hours, Tenormin 25 mg daily, lisinopril 20 mg twice daily.   Controlled on 5/3 11. Myasthenia gravis/progressive gait disorder. Patient on no medications. Follow-up outpatient neurologyDr. Krista Blue 12. Thrombocytopenia/elevated AST and total bilirubin. Likely due to cirrhosis/liver disease.    Platelets  - stable at 90k today 5/3 13. 4.6 cm  ascending thoracic aortic aneurysm. Recommend semiannual imaging followed by CTA or MRA  (? referral to cardiothoracic surgery given above prognosis).    LOS: 7 days A FACE TO [place] 07/24/2019, 10:14 AM

## 2019-07-24 NOTE — Progress Notes (Signed)
Occupational Therapy Session Note  Patient Details  Name: Glen Finley MRN: [id number] Date of Birth: Sep 02, 1934  Today's Date: 07/24/2019 OT Individual Time: 2774-1287 and 1300- 1345 OT Individual Time Calculation (min): 54 min and 45 mins   Short Term Goals: Week 1:  OT Short Term Goal 1 (Week 1): STG=LTG  Skilled Therapeutic Interventions/Progress Updates:    Session 1: Upon entering the room, pt supine in bed with nose bleed taking several minutes for it to stop and then set up A for pt to clean hands and face with cloth. Pt performing supine >sit with supervision to EOB. Pt transferred from bed >wheelchair with CGA. Pt set up with breakfast tray and while eating OT began energy conservation education. Plans to bring him paper handout in next session. OT also discussing home set up with equipment needs and plans to go to ADL apartment next session as well. Pt remains up in wheelchair with chair alarm belt donned and call bell within reach.   Session 2: Upon entering the room, pt seated in wheelchair and finished with lunch. Pt requesting to use bathroom and standing with min guard to ambulate with RW into bathroom for toileting needs. Min guard for balance with clothing management and hygiene. Pt standing at sink for hand hygiene at [place]. Pt returning to wheelchair and propelling self with B LEs and UEs to ADL apartment with supervision. Pt practicing simulated transfer onto sofa with RW on carpeted surface with min guard and then min A as pt with posterior LOB. OT providing using to keep RW on ground as he keeps trying to pick it up with each step vs rolling. Pt ambulating to shower for transfer and needing min A and use of grab bar to step over tub ledge and onto shower seat like home environment. Pt felt family would be able to assist with this. Pt returning to wheelchair at end of session and returning back to room. Chair alarm belt donned and call bell within reach upon exiting the room.    Therapy Documentation Precautions:  Precautions Precautions: Fall Restrictions Weight Bearing Restrictions: No General:   Vital Signs: Therapy Vitals Temp: 98.8 F (37.1 C) Temp Source: Oral Pulse Rate: 64 Resp: 18 BP: 131/65 Patient Position (if appropriate): Lying Oxygen Therapy SpO2: 90 % O2 Device: Room Air Pain:   ADL: ADL Grooming: Setup, Minimal assistance Upper Body Bathing: Contact guard Where Assessed-Upper Body Bathing: Shower Lower Body Bathing: Moderate assistance Where Assessed-Lower Body Bathing: Shower Upper Body Dressing: Minimal assistance Where Assessed-Upper Body Dressing: Edge of bed Lower Body Dressing: Moderate assistance Where Assessed-Lower Body Dressing: Edge of bed Toileting: Other (Comment)(incontient) Where Assessed-Toileting: Glass blower/designer: Psychiatric nurse Method: Optometrist: Walk in Facilities manager Transfer: Minimal assistance Vision   Perception    Praxis   Exercises:   Other Treatments:     Therapy/Group: Individual Therapy  Gypsy Decant 07/24/2019, 8:00 AM

## 2019-07-24 NOTE — Discharge Summary (Signed)
Physician Discharge Summary  Patient ID: Glen Finley MRN: [id number] DOB/AGE: 1935/02/25 85 y.o.  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 No focal consolidation or pleural effusion. Aortic atherosclerosis. No pneumothorax IMPRESSION: 1. No acute focal airspace disease 2. Mild cardiomegaly Electronically Signed   By: Donavan Foil M.D.   On: 07/01/2019 21:29   CT Head Wo Contrast  Result Date: 06/28/2019 CLINICAL DATA:  Confusion. Increased confusion over the past 2 days. EXAM: CT HEAD WITHOUT CONTRAST TECHNIQUE: Contiguous axial images were obtained from the base of the skull through the vertex without intravenous contrast. COMPARISON:  Head CT and brain MRI May 2013 FINDINGS: Brain: No intracranial hemorrhage, mass effect, or midline shift. Age related atrophy. No hydrocephalus. The basilar cisterns are patent. Moderate to advanced chronic small vessel ischemia. No evidence of territorial infarct or acute ischemia. No extra-axial or intracranial fluid collection. Vascular: Atherosclerosis of skullbase vasculature without hyperdense vessel or abnormal calcification. Skull: No fracture or focal lesion. Sinuses/Orbits: Complete opacification of left side of sphenoid sinus with high-density material. Opacification of posterior  left ethmoid air cells. There is some surrounding bony sclerosis. Mild mucosal thickening of right side of sphenoid sinus. Mastoid air cells are clear. Bilateral cataract resection. Other: None. IMPRESSION: 1. No acute intracranial abnormality. 2. Age related atrophy. Moderate to advanced chronic small vessel ischemia. 3. Opacification of left sphenoid sinus and posterior ethmoid air cells with high density material. There is adjacent bony sclerosis suggesting this is chronic sinusitis, however was not seen on 2013 exam. Electronically Signed   By: Keith Rake M.D.   On: 06/28/2019 17:47   CT SOFT TISSUE NECK W CONTRAST  Result Date: 07/03/2019 CLINICAL DATA:  Provided INDICATION: Retropharyngeal/prevertebral edema, abscess, sepsis. EXAM: CT NECK WITH CONTRAST TECHNIQUE: Multidetector CT imaging of the neck was performed using the standard protocol following the bolus administration of intravenous contrast. CONTRAST:  74mL OMNIPAQUE IOHEXOL 300 MG/ML  SOLN COMPARISON:  Cervical spine MRI 07/02/2019, maxillofacial CT 07/02/2019. FINDINGS: Pharynx and larynx: Streak artifact from dental restoration limits evaluation of the oral cavity. There is no appreciable discrete mass or swelling within the oral cavity or nasopharynx. Again demonstrated is abnormal prevertebral/retropharyngeal fluid or edema extending from the C1-C2 level inferiorly to the C7 level. Centered at the C5-C6 level there is prominent inflammatory stranding and phlegmon extending into the right neck soft tissues measuring 4.0 x 4.6 x 7.5 cm (for instance as seen on series 10, image 88 and series 8, image 44). Within this region at the C5 level, there is a somewhat circumscribed region of hypoattenuation along the right posterior aspect of the thyroid cartilage measuring 3.0 x 2.0 cm in transaxial dimensions (series 10, image 74). Although no well-defined peripheral enhancement is demonstrated, findings are suspicious for early abscess  formation. There is associated leftward deviation of the laryngeal airway and subglottic trachea without significant airway narrowing. There is rightward  displacement of the right internal jugular vein and common carotid artery. There is heterogeneous enhancement of the right thyroid lobe suggestive of thyroiditis. Salivary glands: Unremarkable. Thyroid: Heterogeneous appearance of the right thyroid lobe with extensive surrounding phlegmonous change/abscess as described below. There are few coarse calcifications within the left thyroid lobe. Lymph nodes: Right cervical chain lymphadenopathy is likely reactive. Most notably a right level III lymph node measures 13 mm in short axis (series 8, image 30). Vascular: The major vascular structures of the neck appear patent. The right internal jugular vein is significantly narrowed at the level of the mid neck due to phlegmonous change/abscess. Limited intracranial: No abnormality identified. Visualized orbits: Excluded from the field of view. Mastoids and visualized paranasal sinuses: The paranasal sinuses are incompletely imaged. Redemonstrated complete opacification of the left sphenoid sinus with associated reactive osteitis. No significant mastoid effusion. Skeleton: No acute bony abnormality. Redemonstrated cervical spondylosis with multilevel disc space narrowing, posterior disc osteophytes as well as uncovertebral hypertrophy. Upper chest: No consolidation within the imaged lung apices. IMPRESSION: Redemonstrated abnormal prevertebral/retropharyngeal fluid or edema extending from the C1-C2 level inferiorly to the C7 level. Extensive inflammatory stranding and phlegmonous change extending into the right neck soft tissues centered at the C5 level. Findings suspicious for an associated 3.0 x 2.0 abscess along the right posterior aspect of the thyroid cartilage at the C5 level. Heterogeneous enhancement of the right thyroid lobe suspicious for secondary thyroiditis.  Findings likely reflect sequela of an infectious process. Close clinical follow-up is recommended with repeat imaging as warranted to exclude alternative etiologies (i.e. Neoplasm). Right cervical lymphadenopathy, likely reactive. Chronic left sphenoid sinusitis. Electronically Signed   By: Kellie Simmering DO   On: 07/03/2019 14:35   CT CHEST W CONTRAST  Result Date: 07/12/2019 CLINICAL DATA:  Abnormal right neck mass, concern for malignancy EXAM: CT CHEST WITH CONTRAST TECHNIQUE: Multidetector CT imaging of the chest was performed during intravenous contrast administration. CONTRAST:  91mL OMNIPAQUE IOHEXOL 300 MG/ML  SOLN COMPARISON:  07/03/2019, 09/28/2011 FINDINGS: Cardiovascular: Heart is not enlarged. No pericardial effusion. There is extensive atherosclerosis of the thoracic aorta and coronary vessels. There is a 4.6 cm ascending thoracic aortic aneurysm. No dissection. Mediastinum/Nodes: Numerous subcentimeter lymph nodes are seen within the mediastinum. Largest lymph node in the right paratracheal region image 30 measures 9 mm in short axis and in the precarinal region image 58 measures 10 mm in short axis. The esophagus and trachea are unremarkable. The infiltrating soft tissue mass involving the right lobe thyroid is again seen, measuring approximately 3.5 x 3.5 cm on image 8, previously measuring approximately 3.9 x 4.0 cm at a similar location. There is continued retropharyngeal soft tissue edema. Lungs/Pleura: There are small bilateral pleural effusions, volume estimated less than 500 cc each. Minimal dependent atelectasis greatest in the left lower lobe. No airspace disease. No pneumothorax. Central airways are patent. Upper Abdomen: Shrunken nodular liver compatible cirrhosis. Extensive upper abdominal varices are seen. Borderline splenomegaly measuring 13 cm. Musculoskeletal: No acute displaced fractures. Chronic T12 compression deformity. Reconstructed images demonstrate no additional findings.  IMPRESSION: 1. Slight decrease in the size of the infiltrating soft tissue right neck mass. Continued involvement of the right lobe thyroid with retropharyngeal and prevertebral soft tissue edema. Please refer to recent biopsy results. 2. Small bilateral pleural effusions with dependent lower lobe atelectasis. 3. Nonspecific borderline enlarged mediastinal lymph nodes. 4. Cirrhosis, with evidence of portal venous hypertension. 5.  Aortic Atherosclerosis (ICD10-I70.0). 6. 4.6 cm ascending thoracic aortic aneurysm. Recommend  semi-annual imaging followup by CTA or MRA and referral to cardiothoracic surgery if not already obtained. This recommendation follows 2010 ACCF/AHA/AATS/ACR/ASA/SCA/SCAI/SIR/STS/SVM Guidelines for the Diagnosis and Management of Patients With Thoracic Aortic Disease. Circulation. 2010; 121: G254-Y706. Aortic aneurysm NOS (ICD10-I71.9) Electronically Signed   By: Randa Ngo M.D.   On: 07/12/2019 20:34   MR CERVICAL SPINE W WO CONTRAST  Result Date: 07/03/2019 CLINICAL DATA:  Initial evaluation for acute sepsis, unknown source, concern for discitis. EXAM: MRI CERVICAL SPINE WITHOUT AND WITH CONTRAST TECHNIQUE: Multiplanar and multiecho pulse sequences of the cervical spine, to include the craniocervical junction and cervicothoracic junction, were obtained without and with intravenous contrast. CONTRAST:  51mL GADAVIST GADOBUTROL 1 MMOL/ML IV SOLN COMPARISON:  Prior maxillofacial CT from earlier the same day. FINDINGS: Alignment: Mild straightening of the normal cervical lordosis. No listhesis. Vertebrae: Vertebral body height maintained without evidence for acute or chronic fracture. Bone marrow signal intensity somewhat diffusely decreased on T1 weighted imaging, nonspecific, but most commonly related to anemia, smoking, or obesity. No discrete or worrisome osseous lesions. Mild reactive endplate changes present about the C4-5 through C6-7 interspaces, degenerative in nature. No findings to  suggest osteomyelitis discitis or septic arthritis. No abnormal marrow edema or enhancement. Cord: Signal intensity within the cervical spinal cord is normal. No abnormal enhancement. No epidural abscess or other collection. Posterior Fossa, vertebral arteries, paraspinal tissues: Age-related cerebral atrophy noted within the visualized brain. Craniocervical junction normal. There is abnormal prevertebral/retropharyngeal edema extending from the nasopharynx through the upper thoracic spine. Abnormal edema with phlegmonous change seen involving the right parapharyngeal space, most pronounced at the level of the thyroid cartilage at approximately level III/IV (series 9, image 29). Finding concerning for acute infection given provided history. There is suggestion of a superimposed abscess at this level measuring up to approximately 2 cm (series 12, image 24), incompletely visualized. No frank loculated retropharyngeal abscess or collection. Prominent right level II/III lymph node measures up to 12 mm, likely reactive (series 8, image 20). Normal flow void seen within the vertebral arteries bilaterally. Disc levels: C2-C3: Small central disc protrusion minimally indents the ventral thecal sac. No canal or foraminal stenosis. C3-C4: Moderate-sized central disc protrusion indents the ventral thecal sac, contacting and mildly flattening the ventral spinal cord. Resultant moderate spinal stenosis. Superimposed bilateral uncovertebral and facet hypertrophy. Resultant mild left greater than right C4 foraminal stenosis. C4-C5: Chronic intervertebral disc space narrowing with mild diffuse disc bulge. Right greater than left uncovertebral hypertrophy. Broad posterior disc osteophyte flattens and partially faces the ventral thecal sac, greater on the right. Resultant mild-to-moderate spinal stenosis with mild flattening of the right hemi cord. Severe right with moderate left C5 foraminal stenosis. C5-C6: Diffuse disc bulge with  bilateral uncovertebral hypertrophy. Broad posterior component effaces the ventral thecal sac and resultant moderate spinal stenosis. Severe right with moderate left C6 foraminal narrowing. C6-C7: Diffuse disc bulge with bilateral uncovertebral hypertrophy. Mild ligament flavum thickening. Resultant mild spinal stenosis without cord impingement. Moderate left worse than right C7 foraminal narrowing. C7-T1:  Negative interspace.  Mild facet hypertrophy.  No stenosis. Visualized upper thoracic spine demonstrates no significant finding. IMPRESSION: 1. Abnormal retropharyngeal/prevertebral edema with associated phlegmonous change within the lower right neck as above, concerning for acute infection. Probable superimposed abscess measuring up to approximately 2 cm, incompletely visualized on this exam. Further evaluation with dedicated soft tissue neck CT with contrast recommended for further evaluation. 2. No evidence for osteomyelitis discitis within the cervical spine. 3. Multilevel cervical spondylosis with  resultant mild to moderate diffuse spinal stenosis at C3-4 through C6-7. Associated moderate to severe bilateral C5 through C7 foraminal narrowing as above. Electronically Signed   By: Jeannine Boga M.D.   On: 07/03/2019 03:29   CT Abdomen Pelvis W Contrast  Result Date: 07/01/2019 CLINICAL DATA:  Cirrhosis EXAM: CT ABDOMEN AND PELVIS WITH CONTRAST TECHNIQUE: Multidetector CT imaging of the abdomen and pelvis was performed using the standard protocol following bolus administration of intravenous contrast. CONTRAST:  124mL OMNIPAQUE IOHEXOL 300 MG/ML  SOLN COMPARISON:  Ultrasound 06/28/2019, CT 06/27/2014 FINDINGS: Lower chest: Lung bases demonstrate streaky atelectasis or scarring within the bases. No acute consolidation or pleural effusion. Borderline to mild cardiomegaly. Hepatobiliary: Cirrhotic morphology of the liver. Subcentimeter hypodensities too small to further characterize. No calcified  gallstone or biliary dilatation Pancreas: Unremarkable. No pancreatic ductal dilatation or surrounding inflammatory changes. Spleen: Normal in size without focal abnormality. Adrenals/Urinary Tract: Adrenal glands are normal. Cortical scarring in the mid left kidney. Negative for hydronephrosis. The bladder is normal Stomach/Bowel: Stomach is within normal limits. Appendix appears normal. No evidence of bowel wall thickening, distention, or inflammatory changes. Diverticular disease of the left colon without acute inflammatory change. Vascular/Lymphatic: Moderate aortic atherosclerosis without aneurysm. Subcentimeter retroperitoneal nodes. Cavernous transformation of the portal vein. Numerous varices and collateral vessels within the abdomen. Reproductive: Slightly enlarged prostate Other: Negative for free air or free fluid. Musculoskeletal: Chronic compression deformity of T12. IMPRESSION: 1. Liver cirrhosis with evidence for portal hypertension. 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 2. Left colon diverticular disease without acute inflammatory process. Electronically Signed   By: Donavan Foil M.D.   On: 07/01/2019 23:26   DG Chest Port 1 View  Result Date: 06/28/2019 CLINICAL DATA:  Increased confusion, cirrhosis EXAM: PORTABLE CHEST 1 VIEW COMPARISON:  None. FINDINGS: No consolidation or edema. No pleural effusion or pneumothorax. Cardiomediastinal contours are within normal limits for portable technique. There is calcified plaque along the aortic arch. IMPRESSION: No acute process in the chest. Electronically Signed   By: Macy Mis M.D.   On: 06/28/2019 15:49   [place] CORE BIOPSY (LYMPH NODES)  Result Date: 07/10/2019 INDICATION: Indeterminate right-sided neck mass, potentially associated with the right lobe of the thyroid with associated right cervical lymphadenopathy. Patient was admitted with concern for infection, however FNA  biopsy of the dominant right-sided neck mass demonstrated atypia. As such, patient returns today for ultrasound-guided core needle biopsy of right-sided neck mass as well as ultrasound-guided core needle biopsy of right cervical lymph node for tissue diagnostic purposes. EXAM: 1. ULTRASOUND-GUIDED RIGHT NECK MASS CORE NEEDLE BIOPSY 2. ULTRASOUND-GUIDED RIGHT CERVICAL LYMPH NODE CORE NEEDLE BIOPSY COMPARISON:  Neck CT-07/03/2019; ultrasound-guided right neck mass fine-needle aspiration-07/04/2019 MEDICATIONS: None ANESTHESIA/SEDATION: Moderate (conscious) sedation was employed during this procedure. A total of Versed 0.5 mg and Fentanyl 25 mcg was administered intravenously. Moderate Sedation Time: 14 minutes. The patient's level of consciousness and vital signs were monitored continuously by radiology nursing throughout the procedure under my direct supervision. COMPLICATIONS: None immediate. TECHNIQUE: Informed written consent was obtained from the patient after a discussion of the risks, benefits and alternatives to treatment. Questions regarding the procedure were encouraged and answered. Initial ultrasound scanning demonstrated grossly unchanged size and appearance of the at least 3.2 x 3.0 cm right sided neck mass, potentially associated with the right lobe of the thyroid (image 4). Additionally, note is made of an approximately 1.3 x 1.0 cm right cervical lymph node  likely correlating with the dominant cervical lymph node seen on preceding neck CT image 58, series 3. Multiple ultrasound images were saved procedural documentation purposes. The procedure was planned. A timeout was performed prior to the initiation of the procedure. The operative sites were was prepped and draped in the usual sterile fashion, and a sterile drape was applied covering the operative field. A timeout was performed prior to the initiation of the procedure. Local anesthesia was provided with 1% lidocaine with epinephrine. Initially  beginning with the dominant right cervical lymph node, an 18 gauge core needle biopsy device was utilized to obtain a 4 core needle biopsies under direct ultrasound guidance. Next, a separate 18 gauge core needle biopsy device was utilized to obtain 5 core needle biopsies of the dominant right neck mass. The samples were placed in saline and submitted separate to pathology. Superficial hemostasis was achieved with manual compression. Post procedure scan was negative for significant hematoma. Dressings were applied. The patient tolerated the procedure well without immediate postprocedural complication. IMPRESSION: 1. Technically successful ultrasound guided biopsy of dominant right cervical lymph node. 2. Technically successful ultrasound-guided biopsy of right-sided neck mass, potentially associated with the right lobe of the thyroid. Electronically Signed   By: Sandi Mariscal M.D.   On: 07/10/2019 16:38   [place] FNA SOFT TISSUE  Result Date: 07/04/2019 INDICATION: 85 year old with a right neck mass. Request for ultrasound-guided aspiration. EXAM: ULTRASOUND-GUIDED FINE NEEDLE ASPIRATION OF RIGHT NECK MASS MEDICATIONS: None. ANESTHESIA/SEDATION: None FLUOROSCOPY TIME:  None COMPLICATIONS: None immediate. PROCEDURE: Informed written consent was obtained from the patient after a thorough discussion of the procedural risks, benefits and alternatives. All questions were addressed. A timeout was performed prior to the initiation of the procedure. Right side of the neck was evaluated with ultrasound. Right neck lesion was identified. The lesion appeared to be mostly solid, therefore, many of the specimens were collected for cytology. Four fine-needle aspirations were performed with 25 gauge needles. Two fine-needle aspirations were obtained with 18 gauge needles. Bandage placed over the puncture site. FINDINGS: Heterogeneous hypoechoic lesion that appears to be inseparable from the thyroid tissue. This soft tissue causes  lateral displacement of the right common carotid artery and right internal jugular vein. No discrete fluid collections were identified. No significant fluid could be aspirated from this right neck lesion/mass. One of the 18 gauge fine-needle aspirations was collected for culture. IMPRESSION: Heterogeneous solid right neck mass. This lesion appears to be indistinguishable from the right thyroid tissue. No significant fluid could be aspirated from this right neck lesion. Ultrasound-guided fine-needle aspirations were obtained from the lesion. Electronically Signed   By: Markus Daft M.D.   On: 07/04/2019 17:42   [place] CORE BIOPSY (SOFT TISSUE)  Result Date: 07/10/2019 INDICATION: Indeterminate right-sided neck mass, potentially associated with the right lobe of the thyroid with associated right cervical lymphadenopathy. Patient was admitted with concern for infection, however FNA biopsy of the dominant right-sided neck mass demonstrated atypia. As such, patient returns today for ultrasound-guided core needle biopsy of right-sided neck mass as well as ultrasound-guided core needle biopsy of right cervical lymph node for tissue diagnostic purposes. EXAM: 1. ULTRASOUND-GUIDED RIGHT NECK MASS CORE NEEDLE BIOPSY 2. ULTRASOUND-GUIDED RIGHT CERVICAL LYMPH NODE CORE NEEDLE BIOPSY COMPARISON:  Neck CT-07/03/2019; ultrasound-guided right neck mass fine-needle aspiration-07/04/2019 MEDICATIONS: None ANESTHESIA/SEDATION: Moderate (conscious) sedation was employed during this procedure. A total of Versed 0.5 mg and Fentanyl 25 mcg was administered intravenously. Moderate Sedation Time: 14 minutes. The patient's level of consciousness and  vital signs were monitored continuously by radiology nursing throughout the procedure under my direct supervision. COMPLICATIONS: None immediate. TECHNIQUE: Informed written consent was obtained from the patient after a discussion of the risks, benefits and alternatives to treatment. Questions  regarding the procedure were encouraged and answered. Initial ultrasound scanning demonstrated grossly unchanged size and appearance of the at least 3.2 x 3.0 cm right sided neck mass, potentially associated with the right lobe of the thyroid (image 4). Additionally, note is made of an approximately 1.3 x 1.0 cm right cervical lymph node likely correlating with the dominant cervical lymph node seen on preceding neck CT image 58, series 3. Multiple ultrasound images were saved procedural documentation purposes. The procedure was planned. A timeout was performed prior to the initiation of the procedure. The operative sites were was prepped and draped in the usual sterile fashion, and a sterile drape was applied covering the operative field. A timeout was performed prior to the initiation of the procedure. Local anesthesia was provided with 1% lidocaine with epinephrine. Initially beginning with the dominant right cervical lymph node, an 18 gauge core needle biopsy device was utilized to obtain a 4 core needle biopsies under direct ultrasound guidance. Next, a separate 18 gauge core needle biopsy device was utilized to obtain 5 core needle biopsies of the dominant right neck mass. The samples were placed in saline and submitted separate to pathology. Superficial hemostasis was achieved with manual compression. Post procedure scan was negative for significant hematoma. Dressings were applied. The patient tolerated the procedure well without immediate postprocedural complication. IMPRESSION: 1. Technically successful ultrasound guided biopsy of dominant right cervical lymph node. 2. Technically successful ultrasound-guided biopsy of right-sided neck mass, potentially associated with the right lobe of the thyroid. Electronically Signed   By: Sandi Mariscal M.D.   On: 07/10/2019 16:38   VAS [place] UPPER EXTREMITY VENOUS DUPLEX  Result Date: 07/13/2019 UPPER VENOUS STUDY  Indications: Swelling Risk Factors: None identified.  Limitations: Poor ultrasound/tissue interface. Comparison Study: No prior studies. Performing Technologist: Oliver Hum RVT  Examination Guidelines: A complete evaluation includes B-mode imaging, spectral Doppler, color Doppler, and power Doppler as needed of all accessible portions of each vessel. Bilateral testing is considered an integral part of a complete examination. Limited examinations for reoccurring indications may be performed as noted.  Right Findings: +----------+------------+---------+-----------+----------+-------+ RIGHT     CompressiblePhasicitySpontaneousPropertiesSummary +----------+------------+---------+-----------+----------+-------+ IJV           Full       Yes       Yes                      +----------+------------+---------+-----------+----------+-------+ Subclavian    Full       Yes       Yes                      +----------+------------+---------+-----------+----------+-------+ Axillary      Full       Yes       Yes                      +----------+------------+---------+-----------+----------+-------+ Brachial      Full       Yes       Yes                      +----------+------------+---------+-----------+----------+-------+ Radial        Full                                          +----------+------------+---------+-----------+----------+-------+  Ulnar         Full                                          +----------+------------+---------+-----------+----------+-------+ Cephalic      Full                                          +----------+------------+---------+-----------+----------+-------+ Basilic       Full                                          +----------+------------+---------+-----------+----------+-------+  Left Findings: +----------+------------+---------+-----------+----------+-------+ LEFT      CompressiblePhasicitySpontaneousPropertiesSummary  +----------+------------+---------+-----------+----------+-------+ Subclavian    Full       Yes       Yes                      +----------+------------+---------+-----------+----------+-------+  Summary:  Right: No evidence of deep vein thrombosis in the upper extremity. No evidence of superficial vein thrombosis in the upper extremity.  Left: No evidence of thrombosis in the subclavian.  *See table(s) above for measurements and observations.  Diagnosing physician: Monica Martinez MD Electronically signed by Monica Martinez MD on 07/13/2019 at 8:10:02 PM.    Final    CT MAXILLOFACIAL WO CONTRAST  Result Date: 07/02/2019 CLINICAL DATA:  85 year old male. Sepsis. Sinus congestion. EXAM: CT MAXILLOFACIAL WITHOUT CONTRAST TECHNIQUE: Multidetector CT imaging of the maxillofacial structures was performed. Multiplanar CT image reconstructions were also generated. COMPARISON:  Head CT 06/28/2019, brain MRI 08/11/2011. FINDINGS: Osseous: The visible cervical vertebrae appear intact (through C5) but there is abnormal prevertebral soft tissue swelling and retropharyngeal or prevertebral fluid/edema (series 3, image 16). This seems to progress caudally in the neck, and becomes eccentric to the right at the level of the thyroid cartilage on series 3, image 87. Mandible intact. No acute dental abnormality identified. Maxilla, zygoma, nasal bones, central skull base and visible calvarium appear intact. Orbits: Intact orbital walls. Postoperative changes to both globes, otherwise negative noncontrast orbits soft tissues. Sinuses: Posterior left ethmoid and left sphenoid sinus opacification with periosteal thickening. But no complicating features are identified, and the remaining paranasal sinuses are well pneumatized. Tympanic cavities and mastoids are clear. Soft tissues: Abnormal prevertebral/retropharyngeal fluid or edema tracking inferiorly in the neck as stated above. There is also a partially retropharyngeal  course of the right ICA. There are reactive appearing right level 3 lymph nodes on series 3, image 85. But elsewhere the noncontrast deep soft tissue spaces of the face and neck are within normal limits. Limited intracranial: Stable, No acute intracranial abnormality. Calcified atherosclerosis at the skull base. IMPRESSION: 1. The dominant abnormality is prevertebral/retropharyngeal fluid or edema tracking which seems to increase towards the lower neck. Reactive appearing right level 3 lymph nodes. Visible cervical vertebrae appear intact, but in the setting of sepsis lower Cervical Discitis-Osteomyelitis is a leading consideration. Retropharyngeal infection is in the differential. Cervical Spine MRI without and with contrast would be most valuable. Failing that Neck CT with IV contrast would be recommended. 2. Posterior left ethmoid and left sphenoid sinusitis, but has a chronic appearance with no complicating features. Electronically Signed   By: Lemmie Evens  [place] Crane M.D.   On: 07/02/2019 14:21   US Abdomen Limited RUQ  Result Date: 06/28/2019 CLINICAL DATA:  Altered mental status.  LFT elevation. EXAM: ULTRASOUND ABDOMEN LIMITED RIGHT UPPER QUADRANT COMPARISON:  None. FINDINGS: Gallbladder: No gallstones or wall thickening visualized. No sonographic Murphy sign noted by sonographer. Common bile duct: Diameter: The common bile duct was poorly evaluated. Liver: Diffuse increased echogenicity with slightly heterogeneous liver. Appearance typically secondary to fatty infiltration. Fibrosis secondary consideration. No secondary findings of cirrhosis noted. No focal hepatic lesion or intrahepatic biliary duct dilatation. The liver surface appears nodular. Portal vein is patent on color Doppler imaging with normal direction of blood flow towards the liver. Other: None. IMPRESSION: 1. No evidence for cholelithiasis. 2. The common bile duct was not visualized. 3. Cirrhosis. Electronically Signed   By: Constance Holster M.D.   On:  06/28/2019 21:00    Labs:  Basic Metabolic Panel: Recent Labs  Lab 07/21/19 0442 07/24/19 0545  NA 138 140  K 3.7 3.5  CL 104 104  CO2 26 29  GLUCOSE 89 93  BUN 11 14  CREATININE 0.96 1.15  CALCIUM 8.4* 8.8*    CBC: Recent Labs  Lab 07/21/19 0442 07/24/19 0545  WBC 6.6 6.5  HGB 13.2 14.2  HCT 38.6* 41.1  MCV 100.3* 98.8  PLT 105* 91*    CBG: Recent Labs  Lab 07/19/19 1155  GLUCAP 98   Family history.  Mother and father hypertension as well as hyperlipidemia.  Denies any colon cancer esophageal cancer rectal cancer  Brief HPI:   Prabhjot Piscitello Zylka is a 85 y.o. right-handed male with history of hyperlipidemia, diastolic congestive heart failure, hypertension, myasthenia gravis gait disorder on no medications without exacerbation followed by neurology services Dr. Krista Blue, obesity with BMI 29.43.  Patient recently discharged 06/30/2019 after being treated for acute hepatic encephalopathy in the setting of lactulose noncompliance.  Per chart review lives with spouse 1 level home reportedly independent but sedentary.  Presented 07/01/2019 with fever and altered mental status.  Admission chemistries BUN 23, creatinine 1.12, total bilirubin 3.2, blood cultures no growth to date lactic acid 2.4 hemoglobin 14.8 platelets 94,000 urinalysis negative nitrite ammonia 92 SARS coronavirus negative.  CT of abdomen pelvis showed liver cirrhosis with evidence for portal hypertension.  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  Left colon diverticular disease without acute inflammatory process.  He was placed on broad-spectrum antibiotics.  CT maxillofacial as well as MRI cervical spine soft tissue of the neck CT showed abnormal prevertebral retropharyngeal fluid or edema extending from C1-C2 level inferiorly to the C7 level.  Extensive inflammatory stranding and phlegmonous change extending into the right neck soft tissue centered at the C5  level.  Findings suspicious for associated 3.0 x 2.0 abscess along the right posterior aspect of the thyroid cartilage at the C5 level.  Patient had ultrasound-guided FNA of right neck mass by interventional radiology 07/04/2019 culture rare GPC.  Blood cultures negative so far.  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  Oncology and ENT Dr. 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  Medical oncology consulted current plan for supportive care potential XRT.  All antibiotics have since been discontinued.  CT of the chest 07/12/2019 showed slight decrease in size of infiltrating soft tissue right neck mass.  Small bilateral pleural effusions with dependent lower lobe  atelectasis.  4.6 cm ascending thoracic aortic aneurysm with recommendations of semiannual imaging followed by CTA or MRA.  In regards to patient's acute hepatic metabolic encephalopathy continues on lactulose 3 times a day ammonia levels improved to 44.  He was admitted for a comprehensive rehab program   Hospital Course: Ka[facility] Callaway Wist was admitted to rehab 07/17/2019 for inpatient therapies to consist of PT, ST and OT at least three hours five days a week. Past admission physiatrist, therapy team and rehab RN have worked together to provide customized collaborative inpatient rehab.  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 Irene Limbo following.  Radiation oncology to follow-up.  Palliative care involved for goals of care.  SCDs for DVT prophylaxis right upper extremity Doppler is negative.  Mood stabilization with Lexapro as well as followed by neuropsychology.  In regards to patient's acute hepatic metabolic encephalopathy lactulose as advised latest ammonia levels 27 improved.  He  exhibited no signs of fluid overload low-dose Lasix as directed.  Blood pressures controlled on a regimen of hydralazine Tenormin lisinopril no orthostasis he would follow-up with primary MD.  Patient with documented history of myasthenia gravis no exacerbation noted no current medications he followed by neurology services Dr. Krista Blue.  Thrombocytopenia elevated AST and total bilirubin likely due to cirrhosis liver disease latest platelet count stable.  Noted findings of 4.6 cm ascending thoracic aortic aneurysm recommendations for semiannual imaging.   Blood pressures were monitored on TID basis and controlled  He/ is continent of bowel and bladder.  He/ has made gains during rehab stay and is attending therapies  He/ will continue to receive follow up therapies   after discharge  Rehab course: During patient's stay in rehab weekly team conferences were held to monitor patient's progress, set goals and discuss barriers to discharge. At admission, patient required minimal guard 250 feet rolling walker, minimal assist stand pivot transfers minimal assist sit to supine.  Set up upper body bathing minimal guard lower body bathing set up upper body dressing minimal guard lower body dressing minimal assist toilet transfers  Physical exam.  Blood pressure 152/67 pulse 68 temperature 98 respiration 22 oxygen saturation 97% room air Constitutional.  Alert and oriented no distress HEENT Head.  Normocephalic and atraumatic Eyes.  Pupils round and reactive to light no discharge.nystagmus Mouth/throat oropharynx clear and moist multiple bruises about anterior lateral neck shoulder girdle area Neck.  Supple nontender no JVD without thyromegaly Cardiac regular rate rhythm without any extra sounds or murmur heard Respiratory effort normal no respiratory distress without wheeze Abdomen.  Soft nontender positive bowel sounds without rebound Neurological alert and oriented follows commands reasonable insight and  awareness some delay in processing.  Motor 4/5 upper extremities bilaterally.  Lower extremities 3+/5 hip flexors knee extension and 4/5 ankle dorsi plantar flexion.  No focal sensory findings.  He/  has had improvement in activity tolerance, balance, postural control as well as ability to compensate for deficits. He/ has had improvement in functional use RUE/LUE  and RLE/LLE as well as improvement in awareness.  Working with energy conservation techniques.  Sit to stand supervision.  Gait training 150 feet rolling walker contact-guard assist.  Needs some cues for safety.  Patient supine to sitting edge of bed with supervision to don shoes and completes functional mobility around room contact-guard assist.  He is able to identify sitting a safer strategy to don shirt overhead.  Completes all functional ability to and  from day room with contact-guard assist.  Full family teaching completed plan discharge to home       Disposition: Discharge to home    Diet: Regular  Special Instructions: No driving smoking or alcohol  Recommend semiannual imaging CTA or MRA for monitoring of 4.6 cm ascending thoracic aortic aneurysm  Medications at discharge 1.  Elavil 25 mg p.o. nightly 2.  Tenormin 25 mg daily 3.  Lexapro 10 mg p.o. every morning 4.  Lasix 20 mg p.o. daily 5.  Hydralazine 50 mg p.o. every 8 hours 6.  Chronulac 30 mg p.o. 3 times daily 7.  Lisinopril 20 mg p.o. twice daily 8.  Potassium chloride 20 mEq p.o. daily 9.  Aspirin 81 mg Monday Wednesday Friday only  30/35 minutes spent completing discharge summary and discharge planning     Follow-up Information    Meredith Staggers, MD Follow up.   Specialty: Physical Medicine and Rehabilitation Why: As directed Contact information: [address] [place] [address] [phone]        Marcial Pacas, MD Follow up.   Specialty: Neurology Why: Call for appointment as needed Contact information: [place] [address] [phone]        Helayne Seminole, MD Follow up.   Specialty: Otolaryngology Why: Call for appointment Contact information: [place] [address] [phone]        Brunetta Genera, MD Follow up.   Specialties: Hematology, Oncology Why: Call for appointment Contact information: [place] [address] [phone]           Signed: Lavon Paganini [place] 07/26/2019, 5:08 AM

## 2019-07-24 NOTE — Plan of Care (Signed)
  Problem: RH SAFETY Goal: RH STG ADHERE TO SAFETY PRECAUTIONS W/ASSISTANCE/DEVICE Description: STG Adhere to Safety Precautions With mod I assist and a walker  Outcome: Progressing Goal: RH STG DECREASED RISK OF FALL WITH ASSISTANCE Description: STG Decreased Risk of Fall With mod I assist Outcome: Progressing   Problem: RH SKIN INTEGRITY Goal: RH STG SKIN FREE OF INFECTION/BREAKDOWN Outcome: Progressing Goal: RH STG MAINTAIN SKIN INTEGRITY WITH ASSISTANCE Description: STG Maintain Skin Integrity with mod I assist  Outcome: Progressing

## 2019-07-24 NOTE — Progress Notes (Signed)
Physical Therapy Discharge Summary  Patient Details  Name: Glen Finley MRN: [id number] Date of Birth: [birth year]  Today's Date: 07/25/2019 PT Individual Time: 0901-1005 PT Individual Time Calculation (min): 64 min    Patient has met 1 of 10 long term goals due to improved activity tolerance, improved balance, improved postural control, increased strength, improved attention, improved awareness and improved coordination.  Patient to discharge at an ambulatory level Supervision.   Patient's care partner is independent to provide the necessary physical and cognitive assistance at discharge.  Reasons goals not met: Patient requires CGA for increased safety with all mobility do to intermittent LOB, patient's family can provide this at d/c and demonstrated safe guarding technique during family education session.   Recommendation:  Patient will benefit from ongoing skilled PT services in home health setting to continue to advance safe functional mobility, address ongoing impairments in balance, endurance and global strength, and minimize fall risk.  Equipment: has RW and w/c already   Reasons for discharge: treatment goals met and discharge from hospital  Patient/family agrees with progress made and goals achieved: Yes  Skilled Therapeutic Intervention: Patient sitting in a chair in the room with NT upon PT arrival. Patient alert and agreeable to PT session. Patient denied pain during session. Noted patient was incontinent of bladder upon PT arrival. After patient cleaned up and changed, his wife and son arrived for family education session ad participated in hands-on training throughout session.  Therapeutic Activity: Bed Mobility: Patient performed rolling R/L independently in a flat bed without use of rails, and supine to/from sit with supervision for safety and cues to scoot away from the edge of the bed in lying. Transfers: Patient performed sit to/from stand from an arm chair, BSC,  elevated bed, ADL couch, mat table, and w/c with CGA for safety. Provided verbal cues for reaching back to sit, hand placement on RW, and demonstration of safe guarding technique. Required max A, for time management, for peri-care and LB doffing/donning incontinence brief and pants during transfers to [facility]. Patient performed a simulated sedan height car transfer with CGA using RW. Provided cues for safe technique.  Gait Training:  Patient ambulated ~250 feet, 100 feet, and 120 feet using RW with CGA for safety/balance. Ambulated as described below. Provided verbal cues for forward weight shift to reduce posterior LOB, erect posture, and staying close to the RW for safety. Patient ambulated up/down a ramp x2 to simulate home entry with CGA using RW. Provided cues for technique and use of AD.  Therapeutic Exercise: Patient performed 1 set the following exercises provided as HEP with a handout with verbal and tactile cues for proper technique. Introduced HEP for [employer] exercises to address strength and balance. Pt performed 8-10 reps x 1 sets for BLE including LAQ with 5 second hold, standing hip abduction, standing hamstring curls, mini squats, sit to stands, and standing heel/toe raises.  Patient's wife demonstrated safe guarding following demonstration for all mobility performed during session. Educated patient and his family on fall risk/prevention, home modifications to prevent falls, and activation of emergency services in the event of a fall during session.   Patient in w/c with his family in the room at end of session with breaks locked, seat belt alarm set, and all needs within reach.    PT Discharge Precautions/Restrictions Precautions Precautions: Fall Restrictions Weight Bearing Restrictions: No Vision/Perception  Perception Perception: Within Functional Limits Praxis Praxis: Intact  Cognition Overall Cognitive Status: History of cognitive impairments - at  baseline  Arousal/Alertness: Awake/alert Orientation Level: Oriented X4 Attention: Selective Sustained Attention: Appears intact Selective Attention: Appears intact Memory: Impaired Memory Impairment: Decreased recall of new information;Decreased short term memory Awareness: Appears intact Problem Solving: Impaired Problem Solving Impairment: Functional complex Safety/Judgment: Appears intact Sensation Sensation Light Touch: Appears Intact Proprioception: Appears Intact Coordination Gross Motor Movements are Fluid and Coordinated: No Fine Motor Movements are Fluid and Coordinated: Yes Coordination and Movement Description: Decreased R LE strength and balance strategies limiting safety with functional mobility Heel Shin Test: [facility] Motor  Motor Motor: Other (comment) Motor - Skilled Clinical Observations: generalized weakness, intermittent posterior LOB with mobility Motor - Discharge Observations: improved strength, functional mobility, and activity tolerance, continues to be high fall risk, recomment CGA at d/c with standing or gait for safety  Mobility Bed Mobility Bed Mobility: Rolling Right;Rolling Left;Supine to Sit;Sit to Supine Rolling Right: Independent Rolling Left: Independent Supine to Sit: Supervision/Verbal cueing Sit to Supine: Supervision/Verbal cueing Transfers Transfers: Sit to Stand;Stand to Sit;Stand Pivot Transfers Sit to Stand: Contact Guard/Touching assist Stand to Sit: Contact Guard/Touching assist Stand Pivot Transfers: Contact Guard/Touching assist Transfer (Assistive device): Rolling walker Locomotion  Gait Ambulation: Yes Gait Assistance: Contact Guard/Touching assist Gait Distance (Feet): 250 Feet(greater than 250 ft intermittently, limited by decreased endurance at times) Assistive device: Rolling walker Gait Gait: Yes Gait Pattern: Step-through pattern;Decreased stride length;Decreased hip/knee flexion - left;Decreased hip/knee flexion -  right;Right flexed knee in stance;Left flexed knee in stance;Decreased trunk rotation;Trunk flexed;Wide base of support(minor LOB due to posterior lean intermittently with CGA-min A to correct, improving) Gait velocity: decreased Stairs / Additional Locomotion Stairs: Yes Stairs Assistance: Contact Guard/Touching assist Stair Management Technique: One rail Right Number of Stairs: 4 Height of Stairs: 6 Ramp: Contact Guard/touching assist Wheelchair Mobility Wheelchair Mobility: No(patient is a Engineer, petroleum)  Trunk/Postural Assessment  Cervical Assessment Cervical Assessment: Exceptions to WFL(forward head) Thoracic Assessment Thoracic Assessment: Exceptions to WFL(kyphotic) Lumbar Assessment Lumbar Assessment: Exceptions to WFL(increased lordosis) Postural Control Postural Control: decreased/delayed, inadequate posterior stepping strategies Balance Static Sitting Balance Static Sitting - Level of Assistance: 7: Independent Dynamic Sitting Balance Dynamic Sitting - Level of Assistance: 5: Stand by assistance Static Standing Balance Static Standing - Level of Assistance: 5: Stand by assistance Dynamic Standing Balance Dynamic Standing - Level of Assistance: 5: Stand by assistance Extremity Assessment  RLE Assessment RLE Assessment: Exceptions to [facility] Active Range of Motion (AROM) Comments: functionally WNL, hx of R knee pain slightly limiting R knee ROM General Strength Comments: ankle dorsiflexors 4+/5, ankle plantarflexors 4/5, quad 4+/5, hamstring 5/5, hip flexor 4+/5, sitting hip ABD 4/5 LLE Assessment LLE Assessment: WFL Active Range of Motion (AROM) Comments: functionally WNL General Strength Comments: Grossly 5/5 throughout in sitting    Cherie L Grunenberg PT, DPT  07/25/2019, 12:13 PM

## 2019-07-25 ENCOUNTER — Encounter (HOSPITAL_COMMUNITY): Payer: Medicare Other | Admitting: Occupational Therapy

## 2019-07-25 ENCOUNTER — Encounter (HOSPITAL_COMMUNITY): Payer: Medicare Other | Admitting: Speech Pathology

## 2019-07-25 ENCOUNTER — Ambulatory Visit (HOSPITAL_COMMUNITY): Payer: Medicare Other

## 2019-07-25 ENCOUNTER — Inpatient Hospital Stay (HOSPITAL_COMMUNITY): Payer: Medicare Other | Admitting: Occupational Therapy

## 2019-07-25 MED ORDER — ATENOLOL 25 MG PO TABS: 25.0000 mg | ORAL_TABLET | Freq: Every morning | ORAL | 0 refills | Status: AC

## 2019-07-25 MED ORDER — FUROSEMIDE 20 MG PO TABS
20.0000 mg | ORAL_TABLET | Freq: Once | ORAL | Status: AC
  Administered 2019-07-25: 20 mg via ORAL
  Filled 2019-07-25: qty 1

## 2019-07-25 MED ORDER — LACTULOSE 10 GM/15ML PO SOLN: 30.0000 g | Freq: Three times a day (TID) | ORAL | 0 refills | Status: DC

## 2019-07-25 MED ORDER — AMITRIPTYLINE HCL 25 MG PO TABS: 25.0000 mg | ORAL_TABLET | Freq: Every day | ORAL | 0 refills | Status: DC

## 2019-07-25 MED ORDER — POTASSIUM CHLORIDE CRYS ER 20 MEQ PO TBCR: 20.0000 meq | EXTENDED_RELEASE_TABLET | Freq: Every day | ORAL | 0 refills | Status: AC

## 2019-07-25 MED ORDER — HYDRALAZINE HCL 50 MG PO TABS: 50.0000 mg | ORAL_TABLET | Freq: Three times a day (TID) | ORAL | 0 refills | Status: AC

## 2019-07-25 MED ORDER — ESCITALOPRAM OXALATE 10 MG PO TABS: 10.0000 mg | ORAL_TABLET | Freq: Every morning | ORAL | 5 refills | Status: AC

## 2019-07-25 MED ORDER — LISINOPRIL 20 MG PO TABS: 20.0000 mg | ORAL_TABLET | Freq: Two times a day (BID) | ORAL | 0 refills | Status: AC

## 2019-07-25 MED ORDER — FUROSEMIDE 40 MG PO TABS: 40.0000 mg | ORAL_TABLET | Freq: Every day | ORAL | 1 refills | Status: DC

## 2019-07-25 NOTE — Progress Notes (Signed)
[facility] PHYSICAL MEDICINE & REHABILITATION PROGRESS NOTE   Subjective/Complaints: Up in bed trying to use urinal. Struggling due to position. No new complaints otherwise  ROS: Patient denies fever, rash, sore throat, blurred vision, nausea, vomiting, diarrhea, cough, shortness of breath or chest pain, joint or back pain, headache, or mood change. .    Objective:   No results found. Recent Labs    07/24/19 0545  WBC 6.5  HGB 14.2  HCT 41.1  PLT 91*   Recent Labs    07/24/19 0545  NA 140  K 3.5  CL 104  CO2 29  GLUCOSE 93  BUN 14  CREATININE 1.15  CALCIUM 8.8*    Intake/Output Summary (Last 24 hours) at 07/25/2019 1118 Last data filed at 07/25/2019 0900 Gross per 24 hour  Intake 118 ml  Output --  Net 118 ml     Physical Exam: Vital Signs Blood pressure (!) 135/57, pulse 67, temperature 98.2 F (36.8 C), temperature source Oral, resp. rate 16, weight 86.1 kg, SpO2 94 %. Constitutional: No distress . Vital signs reviewed. HEENT: EOMI, oral membranes moist Neck: supple Cardiovascular: RRR without murmur. No JVD    Respiratory/Chest: CTA Bilaterally without wheezes or rales. Normal effort    GI/Abdomen: BS +, non-tender, non-distended Ext: no clubbing, cyanosis, or edema Psych: pleasant and cooperative Skin: Warm and dry.  Intact. Musc: No edema in extremities.  No tenderness in extremities. Neuro: Alert. Follows basic commands. Fair insight and awareness Motor: 4+/5 throughout, unchanged  Assessment/Plan: 1. 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  Physiatrist is providing close team supervision and 24 hour management of active medical problems listed below.  Physiatrist and rehab team continue to assess barriers to discharge/monitor patient progress toward functional and medical goals  Care Tool:  Bathing    Body parts  bathed by patient: Right arm, Left arm, Chest, Abdomen, Front perineal area, Buttocks, Right upper leg, Left upper leg, Right lower leg, Left lower leg, Face         Bathing assist Assist Level: Minimal Assistance - Patient > 75%     Upper Body Dressing/Undressing Upper body dressing   What is the patient wearing?: Pull over shirt    Upper body assist Assist Level: Minimal Assistance - Patient > 75%    Lower Body Dressing/Undressing Lower body dressing      What is the patient wearing?: Pants, Incontinence brief     Lower body assist Assist for lower body dressing: Moderate Assistance - Patient 50 - 74%     Toileting Toileting Toileting Activity did not occur Landscape architect and hygiene only): N/A (no void or bm)(incontient of BM)  Toileting assist Assist for toileting: Contact Guard/Touching assist     Transfers Chair/bed transfer  Transfers assist     Chair/bed transfer assist level: Contact Guard/Touching assist     Locomotion Ambulation   Ambulation assist      Assist level: Contact Guard/Touching assist Assistive device: Walker-rolling Max distance: 160   Walk 10 feet activity   Assist     Assist level: Supervision/Verbal cueing Assistive device: Walker-rolling   Walk 50 feet activity   Assist    Assist level: Contact Guard/Touching assist Assistive device: Walker-rolling    Walk 150 feet activity   Assist    Assist level: Contact Guard/Touching assist Assistive device: Walker-rolling    Walk 10 feet on uneven  surface  activity   Assist Walk 10 feet on uneven surfaces activity did not occur: Safety/medical concerns(fatigue)   Assist level: Contact Guard/Touching assist Assistive device: Aeronautical engineer Will patient use wheelchair at discharge?: No   Wheelchair activity did not occur: N/A         Wheelchair 50 feet with 2 turns activity    Assist    Wheelchair 50 feet with 2  turns activity did not occur: N/A       Wheelchair 150 feet activity     Assist  Wheelchair 150 feet activity did not occur: N/A       Blood pressure (!) 135/57, pulse 67, temperature 98.2 F (36.8 C), temperature source Oral, resp. rate 16, weight 86.1 kg, SpO2 94 %.  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  Tumor is not resectable. Oncology (Dr. Irene Limbo) following. Rad-Onc spoke to wife and decision was made that he would not want palliative XRT. Palliative care involved. Appreciate recs  Team conference today. On track for dc tomorrow 2. Antithrombotics: -DVT/anticoagulation:SCDs. Right upper extremity Doppler negative. -antiplatelet therapy: N/A 3. Pain Management:Currently on no Tylenol or pain medication 4. Mood:Lexapro 10 mg daily. teamProviding emotional support -antipsychotic agents: N/A 5. Neuropsych: This patientis ?capable of making decisions on hisown behalf. 6. Skin/Wound Care:Routine skin checks 7. Fluids/Electrolytes/Nutrition:encourage po  -protein supp for low albumin 8. Acute hepatic/metabolic encephalopathy. Continue lactulose 30 mg 3 times daily.   -cognitively waxes and wanes, some improvement. Baseline dysfunction likely  Ammonia holding at 33 5/3---seems to range from 25-60  -needs follow up as outpt 9. Chronic diastolic congestive heart failure.  Lasix 40 mg daily.    Filed Weights   07/23/19 0441 07/24/19 0538 07/25/19 0500  Weight: 83.9 kg 84.1 kg 86.1 kg    Weight sl up today 5/4. Will give addnl 20mg  lasix this afternoon 10. Hypertension. Hydralazine 50 mg every 8 hours, Tenormin 25 mg daily, lisinopril 20 mg twice daily.   Controlled on 5/4 11. Myasthenia gravis/progressive gait disorder. Patient on no medications. Follow-up outpatient neurologyDr. Krista Blue 12.  Thrombocytopenia/elevated AST and total bilirubin. Likely due to cirrhosis/liver disease.    Platelets  - stable at 90k   5/3 13. 4.6 cm ascending thoracic aortic aneurysm. Recommend semiannual imaging followed by CTA or MRA  (? referral to cardiothoracic surgery given above prognosis).    LOS: 8 days A FACE TO FACE EVALUATION WAS PERFORMED  Meredith Staggers 07/25/2019, 11:18 AM

## 2019-07-25 NOTE — Progress Notes (Signed)
Social Work Patient ID: Glen Finley, male   DOB: [birth year], 85 y.o.   MRN: [id number]    SW spoke with Sarah/[facility] [phone]) to follow-up about referral for HHPT/OT/ST/CNA/SW (if available). Referral declined.   SW faxed referral to [facility] ([phone]/Q:[phone]). Referral declined due to staffing.   SW spoke with Drew/[facility] [phone]) who will explore referral and will follow-up if able to accept.   SW faxed referral to Audrey/[facility] ([phone]/[phone]) and waiting on if referral can be accepted.   SW spoke with pt wife [name] [phone]) to inform on challenges with obtaining HH. SW informed if unable to secure at time of discharge, SW will continue to work on until established. No questions/concerns reported. Confirms 3in1 BSC was received and in pt room.   Loralee Pacas, MSW, [place] Office: [phone] Cell: [phone] Fax: [phone]

## 2019-07-25 NOTE — Progress Notes (Signed)
Occupational Therapy Discharge Summary  Patient Details  Name: Glen Finley MRN: [id number] Date of Birth: [birth year]  Today's Date: 07/25/2019 OT Individual Time: 1032-1100 and 1412- 1520 OT Individual Time Calculation (min): 28 min and 68 mins   Patient has met 5 of 10 long term goals due to improved activity tolerance, improved balance, postural control, ability to compensate for deficits, improved awareness and improved coordination.  Patient to discharge at overall S- CGA level.  Patient's care partner is independent to provide the necessary physical and cognitive assistance at discharge.    Reasons goals not met: goals not met secondary to pt needing CGA for balance and posterior bias for safety  Recommendation:  Patient will benefit from ongoing skilled OT services in home health setting to continue to advance functional skills in the area of BADL and iADL.  Equipment: BSC  Reasons for discharge: pt has reached rehab potential in this setting  Patient/family agrees with progress made and goals achieved: Yes   OT Intervention:  Session 1: Upon entering the room, pt seated in wheelchair with family present for hands on family education in preparation for upcoming discharge. OT reviewed OT purpose, POC, and goals with pt and caregivers and dicussed pt's goal level. His wife provided supervision- CGA for balance with ambulating to ADL apartment with use of RW. Pt taking seated rest break on sofa. OT demonstrated functional step over into shower based on pt's home set up.  Pt standing from low sofa with min guard and Wife assisted pt into bathroom. Caregiver demonstrated with pt with min A overall when stepping over tub ledge while holding gar bar. Pt returning to wheelchair and caregiver propelled pt to room. OT verbalizing HHOT recommendation and answering further questions. Pt transitioning to SLP family education as therapist exits the room.   Session 2: Upon entering the room, pt  seated in wheelchair awaiting OT arrival with no c/o pain this session. Pt agreeable to shower. Pt ambulating with CGA and use of RW into bathroom. Pt doffing clothing items while seated on edge of TTB. Pt bathing self while seated with supervision overall and performing lateral leans to wash buttocks as needed. Pt drying and then exiting the shower to return to wheelchair for dressing tasks. Pt donning pull over shirt with supervision. Pt able to thread pants and socks on B feet with figure four position. Pt needing CGA for balance when standing to pull over B hips. Pt taking seated rest break and propelling wheelchair to sink for grooming tasks while seated with supervision overall. Pt requesting to return to bed at end of session with CGA stand pivot transfer into bed. Bed alarm activated and call bell within reach.   OT Discharge Precautions/Restrictions  Precautions Precautions: Fall  Pain Pain Assessment Pain Scale: 0-10 Pain Score: 0-No pain ADL ADL Grooming: Setup, Minimal assistance Upper Body Bathing: Contact guard Where Assessed-Upper Body Bathing: Shower Lower Body Bathing: Moderate assistance Where Assessed-Lower Body Bathing: Shower Upper Body Dressing: Minimal assistance Where Assessed-Upper Body Dressing: Edge of bed Lower Body Dressing: Moderate assistance Where Assessed-Lower Body Dressing: Edge of bed Toileting: Other (Comment)(incontient) Where Assessed-Toileting: Glass blower/designer: Psychiatric nurse Method: Optometrist: Walk in Retail buyer: Minimal assistance Vision Baseline Vision/History: Wears glasses Wears Glasses: Reading only Patient Visual Report: No change from baseline Vision Assessment?: No apparent visual deficits Perception    Praxis Praxis: Intact Cognition Overall Cognitive Status: History of cognitive impairments - at baseline Arousal/Alertness: Awake/alert Orientation  Level:  Oriented X4 Attention: Selective Sustained Attention: Appears intact Selective Attention: Appears intact Memory: Impaired Memory Impairment: Decreased recall of new information;Decreased short term memory Awareness: Appears intact Problem Solving: Impaired Problem Solving Impairment: Functional complex Safety/Judgment: Appears intact Sensation Sensation Light Touch: Appears Intact Hot/Cold: Appears Intact Proprioception: Appears Intact Stereognosis: Not tested Coordination Gross Motor Movements are Fluid and Coordinated: No Fine Motor Movements are Fluid and Coordinated: Yes Coordination and Movement Description: Decreased R LE strength and balance strategies limiting safety with functional mobility Heel Shin Test: [facility] Motor  Motor Motor: Other (comment) Motor - Discharge Observations: improved strength, functional mobility, and activity tolerance, continues to be high fall risk, recomment CGA at d/c with standing or gait for safety Mobility  Bed Mobility Bed Mobility: Rolling Right;Rolling Left;Supine to Sit;Sit to Supine Rolling Right: Independent Rolling Left: Independent Supine to Sit: Supervision/Verbal cueing Sit to Supine: Supervision/Verbal cueing Transfers Sit to Stand: Contact Guard/Touching assist Stand to Sit: Contact Guard/Touching assist  Trunk/Postural Assessment  Cervical Assessment Cervical Assessment: Exceptions to WFL(forward head) Thoracic Assessment Thoracic Assessment: Exceptions to WFL(kyphotic) Lumbar Assessment Lumbar Assessment: Exceptions to WFL(lordosis) Postural Control Postural Control: Within Functional Limits  Balance Balance Balance Assessed: Yes Static Sitting Balance Static Sitting - Level of Assistance: 7: Independent Dynamic Sitting Balance Dynamic Sitting - Level of Assistance: 5: Stand by assistance Static Standing Balance Static Standing - Level of Assistance: 5: Stand by assistance Dynamic Standing Balance Dynamic Standing  - Level of Assistance: 5: Stand by assistance Extremity/Trunk Assessment RUE Assessment RUE Assessment: Within Functional Limits LUE Assessment LUE Assessment: Within Functional Limits   Gypsy Decant 07/25/2019, 12:41 PM

## 2019-07-25 NOTE — Plan of Care (Signed)
  Problem: RH BOWEL ELIMINATION Goal: RH STG MANAGE BOWEL WITH ASSISTANCE Description: STG Manage Bowel independently  Outcome: Progressing   Problem: RH SKIN INTEGRITY Goal: RH STG SKIN FREE OF INFECTION/BREAKDOWN Outcome: Progressing Goal: RH STG MAINTAIN SKIN INTEGRITY WITH ASSISTANCE Description: STG Maintain Skin Integrity with mod I assist  Outcome: Progressing   Problem: RH SAFETY Goal: RH STG ADHERE TO SAFETY PRECAUTIONS W/ASSISTANCE/DEVICE Description: STG Adhere to Safety Precautions With mod I assist and a walker  Outcome: Progressing Goal: RH STG DECREASED RISK OF FALL WITH ASSISTANCE Description: STG Decreased Risk of Fall With mod I assist Outcome: Progressing   Problem: RH KNOWLEDGE DEFICIT GENERAL Goal: RH STG INCREASE KNOWLEDGE OF SELF CARE AFTER HOSPITALIZATION Description: Patient will have knowledge of how to safely take care of himself after discharge  Outcome: Progressing   Problem: Consults Goal: RH GENERAL PATIENT EDUCATION Description: See Patient Education module for education specifics. Outcome: Progressing

## 2019-07-25 NOTE — Patient Care Conference (Signed)
Inpatient RehabilitationTeam Conference and Plan of Care Update Date: 07/25/2019   Time: 10:25 AM    Patient Name: Glen Finley      Medical Record Number: [id number]  Date of Birth: April 15, 1934 Sex: Male         Room/Bed: 4M04C/4M04C-01 Payor Info: Payor: Marine scientist / Plan: [place] MEDICARE / Product Type: *No Product type* /    Admit Date/Time:  07/17/2019  5:53 PM  Primary Diagnosis:  Malignant tumor of neck [facility])  Patient Active Problem List   Diagnosis Date Noted  . Thrombocytopenia ([place])   . Transaminitis   . Labile blood pressure   . Chronic diastolic congestive heart failure ([place])   . Encephalopathy, hepatic ([place])   . Goals of care, counseling/discussion   . Palliative care by specialist   . Metabolic encephalopathy   . Metastatic carcinoma ([place])   . Malignant tumor of neck ([facility])   . Retropharyngeal abscess 07/03/2019  . Liver cirrhosis ([place]) 07/02/2019  . Sepsis ([place]) 07/01/2019  . Acute hepatic encephalopathy 06/28/2019  . Encephalopathy 06/28/2019  . Transient alteration of awareness   . Sleep apnea 03/07/2015  . Headache(784.0)   . GERD (gastroesophageal reflux disease)   . Obesity   . REM sleep behavior disorder   . Other extrapyramidal disease and abnormal movement disorder   . Myasthenia gravis without exacerbation ([place])   . Abnormality of gait   . Unspecified psychosis   . Weakness   . Dysarthria 08/10/2011  . Gait instability 08/10/2011  . Hypertension 08/10/2011  . Hyperlipidemia 08/10/2011    Expected Discharge Date: Expected Discharge Date: 07/26/19  Team Members Present: Physician leading conference: Dr. Alger Simons Care Coodinator Present: Erlene Quan, BSW;Genie Caylan Chenard, RN, MSN Nurse Present: Other (comment)(Dionne Rulon Eisenmenger, RN) PT Present: Burnard Bunting, PT OT Present: Darleen Crocker, OT SLP Present: Weston Anna, SLP PPS Coordinator present : Gunnar Fusi, SLP     Current Status/Progress Goal Weekly Team Focus   Bowel/Bladder   Patient continent of Bowel and Bladder  Patient to remain continent of B &b  Assess toileting Q shift and as needed   Swallow/Nutrition/ Hydration             ADL's   CGA - supervision overall for self care tasks with use of RW  supervision overall  d/c planning, family edu, self care retraining, balance, endurance   Mobility   CGA to close supervision, gait >150' w/ RW  supervision  d/c plan, caregiver education, balance   Communication   Supervision  Supervision  family education   Safety/Cognition/ Behavioral Observations  Supervision-Min A  Min A  Family Education   Pain   Patient has no c/o of pain, prn medications as needed  Pain<or =2  Assess pain Q shift and as needed   Skin   Generalized skin bruising on bilateral arms  Patient to remain free further skin breakdown, bruising or impairments  Assess skin q shift and as needed    Rehab Goals Patient on target to meet rehab goals: Yes *See Care Plan and progress notes for long and short-term goals.     Barriers to Discharge  Current Status/Progress Possible Resolutions Date Resolved   Nursing                  PT  Other (comments)  Pt w/ new cancer diagnosis              OT  SLP                SW                Discharge Planning/Teaching Needs:  24/7 care from pt wife (supervision); wife reports pt will have support from family/friends to assist  Family education as recommended by therapy   Team Discussion: MD monitoring labs, palliative care to follow.  RN inc bowel, MASD groin.  PT/OT family ed today and then ready for DC tomorrow.   Revisions to Treatment Plan: DC date changed to 5/5     Medical Summary Current Status: ammonia level stable. cognitively stable to improved. counts stable. onc plan for palliative care Weekly Focus/Goal: finalize medical plan for discharge         Continued Need for Acute Rehabilitation Level of Care: The patient requires daily medical  management by a physician with specialized training in physical medicine and rehabilitation for the following reasons: Direction of a multidisciplinary physical rehabilitation program to maximize functional independence : Yes Medical management of patient stability for increased activity during participation in an intensive rehabilitation regime.: Yes Analysis of laboratory values and/or radiology reports with any subsequent need for medication adjustment and/or medical intervention. : Yes   I attest that I was present, lead the team conference, and concur with the assessment and plan of the team.   Jodell Cipro M 07/26/2019, 12:08 PM   Team conference was held via web/ teleconference due to [place] - 19

## 2019-07-25 NOTE — Progress Notes (Signed)
Speech Language Pathology Discharge Summary  Patient Details  Name: Glen Finley MRN: [id number] Date of Birth: 30-Jul-1934  Today's Date: 07/25/2019 SLP Individual Time: 1100-1125 SLP Individual Time Calculation (min): 25 min   Skilled Therapeutic Interventions:  Skilled treatment session focused on completion of family education with the patient's wife and son. Both were educated regarding patient's current cognitive functioning and strategies to utilize at home to maximize recall, independence and overall safety. They were also educated on s/s of aspiration to look out for since swallowing deficits may be a possibility in the future. Both verbalized understanding and agreement and handouts were also given to reinforce information. Patient left upright in wheelchair with alarm on and all needs within reach.    Patient has met 3 of 3 long term goals.  Patient to discharge at overall Supervision;Min level.   Reasons goals not met: N/A   Clinical Impression/Discharge Summary: Patient has made functional gains and has met 3 of 3 LTGs this admission. Currently, patient requires overall supervision-Min A verbal cues to complete functional and familiar tasks safely in regards to recall, attention and problem solving. Patient is also 100% intelligible at the conversation level with overall Mod I for use of compensatory strategies. Patient and family education is complete and patient will discharge home with 24 hour supervision from family. Patient would benefit from f/u home health SLP services to maximize his cognitive functioning and reduce caregiver burden.   Care Partner:  Caregiver Able to Provide Assistance: Yes  Type of Caregiver Assistance: Physical;Cognitive  Recommendation:  Home Health SLP;24 hour supervision/assistance  Rationale for SLP Follow Up: Maximize cognitive function and independence;Reduce caregiver burden   Equipment: N/A   Reasons for discharge: Discharged from  hospital;Treatment goals met   Patient/Family Agrees with Progress Made and Goals Achieved: Yes    [place], [place] 07/25/2019, 11:36 AM

## 2019-07-26 LAB — BASIC METABOLIC PANEL
Anion gap: 10 (ref 5–15)
BUN: 18 mg/dL (ref 8–23)
CO2: 23 mmol/L (ref 22–32)
Calcium: 8.7 mg/dL — ABNORMAL LOW (ref 8.9–10.3)
Chloride: 104 mmol/L (ref 98–111)
Creatinine, Ser: 1.28 mg/dL — ABNORMAL HIGH (ref 0.61–1.24)
GFR calc Af Amer: 60 mL/min — ABNORMAL LOW (ref >60)
GFR calc non Af Amer: 51 mL/min — ABNORMAL LOW (ref >60)
Glucose, Bld: 102 mg/dL — ABNORMAL HIGH (ref 70–99)
Potassium: 3.7 mmol/L (ref 3.5–5.1)
Sodium: 137 mmol/L (ref 135–145)

## 2019-07-26 MED ORDER — FUROSEMIDE 20 MG PO TABS: 20.0000 mg | ORAL_TABLET | Freq: Every day | ORAL | Status: DC

## 2019-07-26 MED ORDER — FUROSEMIDE 20 MG PO TABS: 20.0000 mg | ORAL_TABLET | Freq: Every day | ORAL | 2 refills | Status: AC

## 2019-07-26 MED ORDER — FUROSEMIDE 20 MG PO TABS: 20.0000 mg | ORAL_TABLET | Freq: Every day | ORAL | 2 refills | Status: DC

## 2019-07-26 NOTE — Progress Notes (Signed)
[place] PHYSICAL MEDICINE & REHABILITATION PROGRESS NOTE   Subjective/Complaints: No new issues. Today. Denies pain  ROS: Patient denies fever, rash, sore throat, blurred vision, nausea, vomiting, diarrhea, cough, shortness of breath or chest pain, joint or back pain, headache, or mood change.  .    Objective:   No results found. Recent Labs    07/24/19 0545  WBC 6.5  HGB 14.2  HCT 41.1  PLT 91*   Recent Labs    07/24/19 0545  NA 140  K 3.5  CL 104  CO2 29  GLUCOSE 93  BUN 14  CREATININE 1.15  CALCIUM 8.8*    Intake/Output Summary (Last 24 hours) at 07/26/2019 0806 Last data filed at 07/25/2019 1300 Gross per 24 hour  Intake 358 ml  Output --  Net 358 ml     Physical Exam: Vital Signs Blood pressure (!) 106/56, pulse 66, temperature 98.4 F (36.9 C), temperature source Oral, resp. rate 16, weight 83.7 kg, SpO2 94 %. Constitutional: No distress . Vital signs reviewed. frail HEENT: EOMI, oral membranes moist Neck: supple Cardiovascular: RRR without murmur. No JVD    Respiratory/Chest: CTA Bilaterally without wheezes or rales. Normal effort    GI/Abdomen: BS +, non-tender, non-distended Ext: no clubbing, cyanosis, or edema Psych: pleasant and cooperative Skin: Warm and dry.  Intact. Musc: No edema in extremities.  No tenderness in extremities. Neuro: Alert. Follows basic commands. Fair insight and awareness Motor: 4+/5 throughout, unchanged  Assessment/Plan: 1. 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  Physiatrist is providing close team supervision and 24 hour management of active medical problems listed below.  Physiatrist and rehab team continue to assess barriers to discharge/monitor patient progress toward functional and medical goals  Care Tool:  Bathing    Body parts bathed by patient: Right arm, Left arm, Chest,  Abdomen, Front perineal area, Buttocks, Right upper leg, Left upper leg, Right lower leg, Left lower leg, Face         Bathing assist Assist Level: Supervision/Verbal cueing     Upper Body Dressing/Undressing Upper body dressing   What is the patient wearing?: Pull over shirt    Upper body assist Assist Level: Supervision/Verbal cueing    Lower Body Dressing/Undressing Lower body dressing      What is the patient wearing?: Pants, Incontinence brief     Lower body assist Assist for lower body dressing: Contact Guard/Touching assist     Toileting Toileting Toileting Activity did not occur (Clothing management and hygiene only): N/A (no void or bm)(incontient of BM)  Toileting assist Assist for toileting: Contact Guard/Touching assist     Transfers Chair/bed transfer  Transfers assist     Chair/bed transfer assist level: Contact Guard/Touching assist Chair/bed transfer assistive device: Programmer, multimedia   Ambulation assist      Assist level: Contact Guard/Touching assist Assistive device: Walker-rolling Max distance: >250'   Walk 10 feet activity   Assist     Assist level: Contact Guard/Touching assist Assistive device: Walker-rolling   Walk 50 feet activity   Assist    Assist level: Contact Guard/Touching assist Assistive device: Walker-rolling    Walk 150 feet activity   Assist    Assist level: Contact Guard/Touching assist Assistive device: Walker-rolling    Walk 10 feet on uneven surface  activity   Assist Walk 10 feet on uneven surfaces activity did not  occur: Safety/medical concerns(fatigue)   Assist level: Contact Guard/Touching assist Assistive device: Aeronautical engineer Will patient 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 rate 16, weight 83.7 kg, SpO2 94 %.  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  Tumor is not resectable. Oncology (Dr. Irene Limbo) following. Rad-Onc spoke to wife and decision was made that he would not want palliative XRT. Palliative care involved. They will follow as needed/wanted by pt/wife  -Patient to see Dr. Ranell Patrick in the office for transitional care encounter in 1-2 weeks.   Dc home today 2. Antithrombotics: -DVT/anticoagulation:SCDs. Right upper extremity Doppler negative. -antiplatelet therapy: N/A 3. Pain Management:Currently on no Tylenol or pain medication 4. Mood:Lexapro 10 mg daily. teamProviding emotional support -antipsychotic agents: N/A 5. Neuropsych: This patientis ?capable of making decisions on hisown behalf. 6. Skin/Wound Care:Routine skin checks 7. Fluids/Electrolytes/Nutrition:encourage po  -protein supp for low albumin 8. Acute hepatic/metabolic encephalopathy. Continue lactulose 30 mg 3 times daily.   -cognitively waxes and wanes, some improvement. Baseline dysfunction likely  Ammonia holding at 33 5/3---seems to range from 25-60  -needs follow up as outpt 9. Chronic diastolic congestive heart failure.  Lasix 40 mg daily.    Filed Weights   07/24/19 0538 07/25/19 0500 07/26/19 0500  Weight: 84.1 kg 86.1 kg 83.7 kg    Weight has leveled out with lasix 40mg  daily  -continue kdur 46meq daily  -reduce lasix to 20mg   -labs reviewed personally this morning 10. Hypertension. Hydralazine 50 mg every 8 hours, Tenormin 25 mg daily, lisinopril 20  mg twice daily.   Controlled on 5/5 11. Myasthenia gravis/progressive gait disorder. Patient on no medications. Follow-up outpatient neurologyDr. Krista Blue 12. Thrombocytopenia/elevated AST and total bilirubin. Likely due to cirrhosis/liver disease.    Platelets  - stable at 90k   5/3 13. 4.6 cm ascending thoracic aortic aneurysm. Recommend semiannual imaging followed by CTA or MRA  (? referral to cardiothoracic surgery given above prognosis).    LOS: 9 days A FACE TO FACE EVALUATION WAS PERFORMED  Meredith Staggers 07/26/2019, 8:06 AM

## 2019-07-26 NOTE — Progress Notes (Signed)
Patient discharged home, stable and appropriate, with wife Glen Finley present to accompany.  Clear speech with demonstrated ability to articulate needs.  No reported discomfort, distress or disturbance.  Nursing transport via wheelchair to personal vehicle.  Personal belongings and medical equipment acknowledged.

## 2019-07-26 NOTE — Progress Notes (Signed)
Palliative:  Note that patient is being discharged today. Spoke with wife briefly about outpatient palliative following at home - she is agreeable. Will discuss with outpatient palliative and TOC team.   Juel Burrow, DNP, [facility] Palliative Medicine Team Team Phone # [phone]  Pager # [phone]  NO CHARGE

## 2019-07-26 NOTE — Plan of Care (Signed)
  Problem: RH BOWEL ELIMINATION Goal: RH STG MANAGE BOWEL WITH ASSISTANCE Description: STG Manage Bowel independently  Outcome: Completed/Met   Problem: RH SKIN INTEGRITY Goal: RH STG SKIN FREE OF INFECTION/BREAKDOWN Outcome: Completed/Met Goal: RH STG MAINTAIN SKIN INTEGRITY WITH ASSISTANCE Description: STG Maintain Skin Integrity with mod I assist  Outcome: Completed/Met   Problem: RH SAFETY Goal: RH STG ADHERE TO SAFETY PRECAUTIONS W/ASSISTANCE/DEVICE Description: STG Adhere to Safety Precautions With mod I assist and a walker  Outcome: Completed/Met Goal: RH STG DECREASED RISK OF FALL WITH ASSISTANCE Description: STG Decreased Risk of Fall With mod I assist Outcome: Completed/Met   Problem: RH KNOWLEDGE DEFICIT GENERAL Goal: RH STG INCREASE KNOWLEDGE OF SELF CARE AFTER HOSPITALIZATION Description: Patient will have knowledge of how to safely take care of himself after discharge  Outcome: Completed/Met   Problem: Consults Goal: RH GENERAL PATIENT EDUCATION Description: See Patient Education module for education specifics. Outcome: Completed/Met

## 2019-07-26 NOTE — Discharge Instructions (Signed)
Inpatient Rehab Discharge Instructions  Glen Finley Discharge date and time: No discharge date for patient encounter.   Activities/Precautions/ Functional Status: Activity: activity as tolerated Diet: regular diet Wound Care: none needed Functional status:  ___ No restrictions     ___ Walk up steps independently ___ 24/7 supervision/assistance   ___ Walk up steps with assistance ___ Intermittent supervision/assistance  ___ Bathe/dress independently ___ Walk with walker     _x__ Bathe/dress with assistance ___ Walk Independently    ___ Shower independently ___ Walk with assistance    ___ Shower with assistance ___ No alcohol     ___ Return to work/school ________   ,COMMUNITY REFERRALS UPON DISCHARGE:    Home Health:   PT   OT     ST     SNA     SW                   Agency: [facility] Phone: [phone] *Please expect follow-up within two days of your discharge to schedule your home visit. If you have not received follow-up, be sure to contact the agency directly.*   Medical Equipment/Items Ordered: 3in1 BSC                                                 Agency/Supplier: [facility] [phone]   Special Instructions: No driving smoking or alcohol   My questions have been answered and I understand these instructions. I will adhere to these goals and the provided educational materials after my discharge from the hospital.  Patient/Caregiver Signature _______________________________ Date __________  Clinician Signature _______________________________________ Date __________  Please bring this form and your medication list with you to all your follow-up doctor's appointments.

## 2019-07-26 NOTE — Progress Notes (Signed)
Social Work Discharge Note   The overall goal for the admission was met for:   Discharge location: Yes. D/c to home with his wife. Pt to have support from various family members.   Length of Stay: Yes. 9 days  Discharge activity level: Yes. PT/OT- CGA; ST-supervision (24/7)  Home/community participation: Yes. Limited.  Services provided included: MD, RD, PT, OT, SLP, RN, CM, TR, Pharmacy, Neuropsych and SW  Financial Services: Private Insurance: [facility] Medicare  Follow-up services arranged: Home Health: [facility] for PT/OT/ST/CNA/SW, DME: [place] for 3in1 [facility] and Patient/Family request agency HH: [facility], DME: N/A  Comments (or additional information): contact pt wife [name] [phone]  Patient/Family verbalized understanding of follow-up arrangements: Yes  Individual responsible for coordination of the follow-up plan: Pt will have assistance with family to help coordinate care needs.   Confirmed correct DME delivered: Rana Snare 07/26/2019    Rana Snare

## 2019-07-26 NOTE — Progress Notes (Addendum)
Social Work Patient ID: Glen Finley, male   DOB: 1934/09/21, 85 y.o.   MRN: [id number]   SW returned phone call to [facility] [phone]) who accepted HHPT/OT/ST/CNA/SW referral. States will call family to schedule the home visit.   SW received updates from palliative care RN Kathie Rhodes who reported after speaking with family they would like to have outpatient palliative care. [facility] liaison [name] confirms there wil be follow-up with the pt and wife.    SW met with pt and pt wife in room to provide updates on above, and discuss [place] process. Confirms palliative care as outpatient. SW confirms 3in1 BSC in room. No further SW interventions.   SW faxed d/c summary to [facility].   Loralee Pacas, MSW, [place] Office: [phone] Cell: [phone] Fax: [phone]

## 2019-07-27 ENCOUNTER — Telehealth: Payer: Self-pay | Admitting: Internal Medicine

## 2019-07-27 ENCOUNTER — Telehealth: Payer: Self-pay | Admitting: Registered Nurse

## 2019-07-27 NOTE — Telephone Encounter (Signed)
Scheduled [facility] Palliative visit for 08-03-19 at 1:30.

## 2019-07-27 NOTE — Telephone Encounter (Signed)
PT wife is calling with questions about home health and appointments here. Please call

## 2019-07-28 NOTE — Telephone Encounter (Signed)
Transitional Care call  Patient name: Glen Finley DOB: 06/27/1934 1. Are you/is patient experiencing any problems since coming home? No a. Are there any questions regarding any aspect of care? No 2. Are there any questions regarding medications administration/dosing? No a. Are meds being taken as prescribed? Yes b. "Patient should review meds with caller to confirm" Medication List Reviewed.  3. Have there been any falls? No 4. Has Home Health been to the house and/or have they contacted you? Yes: [facility]  a. If not, have you tried to contact them? NA b. Can we help you contact them? NA 5. Are bowels and bladder emptying properly? Yes a. Are there any unexpected incontinence issues? No b. If applicable, is patient following bowel/bladder programs? NA 6. Any fevers, problems with breathing, unexpected pain? No 7. Are there any skin problems or new areas of breakdown? No 8. Has the patient/family member arranged specialty MD follow up (ie cardiology/neurology/renal/surgical/etc.)?  [name] has called to schedule HFU appointments except for Dr. Blenda Nicely, she will call she states.  a. Can we help arrange? No 9. Does the patient need any other services or support that we can help arrange? No 10. Are caregivers following through as expected in assisting the patient? Yes 11. Has the patient quit smoking, drinking alcohol, or using drugs as recommended? (                        )  Appointment date/time 08/04/2019  arrival time 10:40 for 11:00 appointment with Dr. Ranell Patrick. At  [place]

## 2019-07-28 NOTE — Telephone Encounter (Signed)
Return Ms. Wars call, no answer. Left message to return the call.

## 2019-08-03 ENCOUNTER — Other Ambulatory Visit: Payer: Self-pay

## 2019-08-03 ENCOUNTER — Other Ambulatory Visit: Payer: Medicare Other | Admitting: Internal Medicine

## 2019-08-03 DIAGNOSIS — Z515 Encounter for palliative care: Secondary | ICD-10-CM

## 2019-08-03 DIAGNOSIS — C760 Malignant neoplasm of head, face and neck: Secondary | ICD-10-CM

## 2019-08-04 ENCOUNTER — Encounter
Payer: Medicare Other | Attending: Physical Medicine and Rehabilitation | Admitting: Physical Medicine and Rehabilitation

## 2019-08-04 ENCOUNTER — Other Ambulatory Visit: Payer: Self-pay

## 2019-08-04 VITALS — BP 97/51 | HR 51 | Temp 98.4°F | Ht 69.0 in | Wt 184.0 lb

## 2019-08-04 DIAGNOSIS — R197 Diarrhea, unspecified: Secondary | ICD-10-CM | POA: Insufficient documentation

## 2019-08-04 DIAGNOSIS — C799 Secondary malignant neoplasm of unspecified site: Secondary | ICD-10-CM | POA: Insufficient documentation

## 2019-08-04 DIAGNOSIS — I951 Orthostatic hypotension: Secondary | ICD-10-CM | POA: Diagnosis present

## 2019-08-04 DIAGNOSIS — K7290 Hepatic failure, unspecified without coma: Secondary | ICD-10-CM | POA: Diagnosis not present

## 2019-08-04 DIAGNOSIS — K7682 Hepatic encephalopathy: Secondary | ICD-10-CM

## 2019-08-04 MED ORDER — AMITRIPTYLINE HCL 10 MG PO TABS: 10.0000 mg | ORAL_TABLET | Freq: Every day | ORAL | 0 refills | Status: DC

## 2019-08-04 NOTE — Progress Notes (Signed)
Subjective:    Patient ID: Glen Finley, male    DOB: [birth year], 85 y.o.   MRN: [id number]  HPI  Mr. Carrier presents today for his transitional care appointment following CIR admission for acute hepatic encephalopathy.   Receiving home therapies, performing HEP regularly, and ambulating within home. Cognition improved.  Pain is 0/10.  BP 97/51 and HR 51.  They have had f/u by phone with PCP. No lab testing since hospitalization. Has not had recent vitamin D level checked. Would like to know how ammonia level has changed. He is still taking Lactulose three times daily and has about three BM per day which is uncomfortably for him.    Plan to defer treatment of cancer at this time.   Sleeping well at night. No depression or anxiety.   Pain Inventory Average Pain 0 Pain Right Now 0 My pain is na  In the last 24 hours, has pain interfered with the following? General activity 0 Relation with others 0 Enjoyment of life 0 What TIME of day is your pain at its worst? evening Sleep (in general) Good  Pain is worse with: na Pain improves with: na Relief from Meds: na  Mobility walk with assistance use a walker how many minutes can you walk? 5 ability to climb steps?  yes do you drive?  no use a wheelchair needs help with transfers  Function retired I need assistance with the following:  meal prep, household duties and shopping  Neuro/Psych bladder control problems bowel control problems weakness trouble walking dizziness  Prior Studies TC visit  Physicians involved in your care TC visit   No family history on file. Social History   Socioeconomic History  . Marital status: Married    Spouse name: Glen Finley  . Number of children: 2  . Years of education: 69  . Highest education level: Not on file  Occupational History  . Occupation: Network engineer.    Comment: Retired  Tobacco Use  . Smoking status: Never Smoker  . Smokeless tobacco: Never Used    Substance and Sexual Activity  . Alcohol use: No    Alcohol/week: 0.0 standard drinks  . Drug use: No  . Sexual activity: Never  Other Topics Concern  . Not on file  Social History Narrative   Patient lives at home with his wife Glen Finley). Patient is retired. High school education.   Left handed.   Caffeine- One cup daily.   Social Determinants of Health   Financial Resource Strain:   . Difficulty of Paying Living Expenses:   Food Insecurity:   . Worried About Charity fundraiser in the Last Year:   . Arboriculturist in the Last Year:   Transportation Needs:   . Film/video editor (Medical):   Marland Kitchen Lack of Transportation (Non-Medical):   Physical Activity:   . Days of Exercise per Week:   . Minutes of Exercise per Session:   Stress:   . Feeling of Stress :   Social Connections:   . Frequency of Communication with Friends and Family:   . Frequency of Social Gatherings with Friends and Family:   . Attends Religious Services:   . Active Member of Clubs or Organizations:   . Attends Archivist Meetings:   Marland Kitchen Marital Status:    Past Surgical History:  Procedure Laterality Date  . CATARACT EXTRACTION Bilateral    Past Medical History:  Diagnosis Date  . Abnormality of gait   . GERD (  gastroesophageal reflux disease)   . Headache(784.0)   . Hyperlipidemia   . Hypertension   . Migraine   . Myasthenia gravis without exacerbation ([place])   . Obesity   . Other extrapyramidal disease and abnormal movement disorder   . REM sleep behavior disorder   . Unspecified psychosis   . Weakness    BP (!) 97/51   Pulse (!) 51   Temp 98.4 F (36.9 C)   Ht 5\' 9"  (1.753 m)   Wt 184 lb (83.5 kg)   SpO2 95%   BMI 27.17 kg/m   Opioid Risk Score:   Fall Risk Score:  `1  Depression screen PHQ 2/9  Depression screen PHQ 2/9 08/04/2019  Decreased Interest 0  Down, Depressed, Hopeless 0  PHQ - 2 Score 0    Review of Systems  Gastrointestinal: Positive for diarrhea.   Musculoskeletal: Positive for gait problem.  Neurological: Positive for dizziness and weakness.  All other systems reviewed and are negative.      Objective:   Physical Exam Constitutional: No distress . Vital signs reviewed. frail HEENT: EOMI, oral membranes moist Neck: supple Cardiovascular: RRR without murmur. No JVD    Respiratory/Chest: CTA Bilaterally without wheezes or rales. Normal effort    GI/Abdomen: BS +, non-tender, non-distended Ext: no clubbing, cyanosis, or edema Psych: pleasant and cooperative Skin: Warm and dry.  Intact. Musc: No edema in extremities.  No tenderness in extremities. Neuro: Alert. Follows basic commands. Fair insight and awareness Motor: 4+/5 throughout     Assessment & Plan:  1.  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                Tumor is not resectable. Oncology (Dr. Irene Limbo) following. Rad-Onc spoke to wife and decision was made that he would not want palliative XRT. Discussed with patient and wife today and provided support for their decision.              Continue home PT, OT, and SLP  Patient requires the need for transitional care appointment given his multiple comorbidities and residual cognitive deficits following acute hepatic.metabolic encephalopathy.  2. Pain management/Mood/Insomnia: Patient denies pain, depression, anxiety, or insomnia. Wean Amitriptyline to 10mg  HS.   3.  Acute hepatic/metabolic encephalopathy.  Ammonia level repeated and called patient and wife with result- increased from 45s to 15s. Continue lactulose 30 mg 3 times daily. Will repeat again in 2 weeks post-follow-up.   4.  Chronic diastolic congestive heart failure.  Continue Lasix 40 mg daily.     5. Elevated liver enzymes and uptrending ammonia: Provided GI referral.   CBC, CMP, Vitamin D, and ammonia levels ordered and results discussed with patient and wife.   All questions  were answered.  Please return to clinic in 2 weeks to assess progress with the above interventions.

## 2019-08-05 ENCOUNTER — Encounter: Payer: Self-pay | Admitting: Physical Medicine and Rehabilitation

## 2019-08-05 LAB — COMPREHENSIVE METABOLIC PANEL
ALT: 34 IU/L (ref 0–44)
AST: 70 IU/L — ABNORMAL HIGH (ref 0–40)
Albumin/Globulin Ratio: 0.9 — ABNORMAL LOW (ref 1.2–2.2)
Albumin: 3 g/dL — ABNORMAL LOW (ref 3.6–4.6)
Alkaline Phosphatase: 122 IU/L — ABNORMAL HIGH (ref 39–117)
BUN/Creatinine Ratio: 12 (ref 10–24)
BUN: 15 mg/dL (ref 8–27)
Bilirubin Total: 1.8 mg/dL — ABNORMAL HIGH (ref 0.0–1.2)
CO2: 26 mmol/L (ref 20–29)
Calcium: 10.3 mg/dL — ABNORMAL HIGH (ref 8.6–10.2)
Chloride: 103 mmol/L (ref 96–106)
Creatinine, Ser: 1.23 mg/dL (ref 0.76–1.27)
GFR calc Af Amer: 62 mL/min/{1.73_m2} (ref >59)
GFR calc non Af Amer: 54 mL/min/{1.73_m2} — ABNORMAL LOW (ref >59)
Globulin, Total: 3.4 g/dL (ref 1.5–4.5)
Glucose: 96 mg/dL (ref 65–99)
Potassium: 4.7 mmol/L (ref 3.5–5.2)
Sodium: 142 mmol/L (ref 134–144)
Total Protein: 6.4 g/dL (ref 6.0–8.5)

## 2019-08-05 LAB — CBC WITH DIFFERENTIAL/PLATELET
Basophils Absolute: 0.1 10*3/uL (ref 0.0–0.2)
Basos: 1 %
EOS (ABSOLUTE): 0.5 10*3/uL — ABNORMAL HIGH (ref 0.0–0.4)
Eos: 7 %
Hematocrit: 41.8 % (ref 37.5–51.0)
Hemoglobin: 14.1 g/dL (ref 13.0–17.7)
Immature Grans (Abs): 0 10*3/uL (ref 0.0–0.1)
Immature Granulocytes: 0 %
Lymphocytes Absolute: 2.3 10*3/uL (ref 0.7–3.1)
Lymphs: 31 %
MCH: 33.3 pg — ABNORMAL HIGH (ref 26.6–33.0)
MCHC: 33.7 g/dL (ref 31.5–35.7)
MCV: 99 fL — ABNORMAL HIGH (ref 79–97)
Monocytes Absolute: 0.8 10*3/uL (ref 0.1–0.9)
Monocytes: 11 %
Neutrophils Absolute: 3.8 10*3/uL (ref 1.4–7.0)
Neutrophils: 50 %
Platelets: 101 10*3/uL — ABNORMAL LOW (ref 150–450)
RBC: 4.23 x10E6/uL (ref 4.14–5.80)
RDW: 13.6 % (ref 11.6–15.4)
WBC: 7.5 10*3/uL (ref 3.4–10.8)

## 2019-08-05 LAB — AMMONIA: Ammonia: 66 ug/dL (ref 28–135)

## 2019-08-05 LAB — VITAMIN D 25 HYDROXY (VIT D DEFICIENCY, FRACTURES): Vit D, 25-Hydroxy: 45.2 ng/mL (ref 30.0–100.0)

## 2019-08-05 NOTE — Progress Notes (Signed)
[place] Consult Note Telephone: [phone]  Fax: [phone]  PATIENT NAME: Glen Finley DOB: April 19, 1934 MRN: [id number]  PRIMARY CARE PROVIDER:   Shon Baton, MD  REFERRING PROVIDER:  Shon Baton, MD [place],  [place] [address]  RESPONSIBLE PARTY:    Self and wife Boen Sterbenz [phone]   Bolt,Margel Joens Spouse [phone]  [phone]   RECOMMENDATIONS and PLAN:  Palliative care encounter  Z51.5   1.Advance care planning:  Living will in place.  Reiewed MOST form and confirmed pt's selections with he and his wif.  His advanced directives are indecisive on code status. He and his wife willn, comfort measures, trial IV fluids,  determine use of antibiotics, no tube feedings. Pt and his wife will have additional discussion related to code status selection.  He expressed desire for DNAR but she desires full code status for him.  His goals are to continue OT and PT at home to improve strength and maintain a normal ammonia level.  Palliative care will continue to f/u with patient for formal declaration.  2.  Unsteady gait: Improving.  Multifactorial.  Fall risk prevention discussed.  Continue physical and occupational therapy.  Monitor for any signs of functional decline.  3.  Decreased appetite and weight loss: Improving since return to home.  Continue small, frequent meals and hydration.  Monitor weight weekly.  4.  Metastatic poorly differentiated carcinoma of neck:  Not a surgical candidate.  Pt and wife decline radiation therapy.  No desire for transition to hospice at this time.  We will continue to monitor patient status and will again discuss hospice services in the future with noted additional decline.  5.  Hepatic cirrhosis: Encouraged administration of lactulose as prescribed.  Remain hydrated.  Monitor for signs of altered mental status with possible elevation of ammonia level.  I spent 60 minutes providing this  consultation,  from 1330 to 1430. More than 50% of the time in this consultation was spent coordinating communication patient and wife/POA.   HISTORY OF PRESENT ILLNESS:  Glen Finley is a 85 y.o. year old male with multiple medical problems including cirrhosis with elevated ammonia, encephalopathy, myasthenia gravis.  He was hospitalized April 7 through April 9 due to increased confusion and falls.  Related to increased CHF, ammonia levels.  He was hospitalized again later in April due to altered mental status and fever with all a retropharyngeal abscess.  Biopsy results of neck mass noted poorly differentiated metastatic carcinoma.  Medical records note possible primary site of cancer is thyroid or lung.  Patient is not a surgical candidate and couple have declined palliative radiation therapy.  He denies pain and is sleeping well.  Require some assistance with ADLs.  Palliative Care was asked to help address goals of care.   CODE STATUS: Full Code  PPS: 40% with assistance of a walker  HOSPICE ELIGIBILITY/DIAGNOSIS: YES/ poorly differentiated metastatic carcinoma of the neck  PAST MEDICAL HISTORY:  Past Medical History:  Diagnosis Date  . Abnormality of gait   . GERD (gastroesophageal reflux disease)   . Headache(784.0)   . Hyperlipidemia   . Hypertension   . Migraine   . Myasthenia gravis without exacerbation ([place])   . Obesity   . Other extrapyramidal disease and abnormal movement disorder   . REM sleep behavior disorder   . Unspecified psychosis   . Weakness     SOCIAL HX: Lives at home with wife.  PERTINENT MEDICATIONS:  Outpatient Encounter Medications as of 08/03/2019  Medication Sig  . alendronate (FOSAMAX) 70 MG tablet Take 70 mg by mouth every Sunday.   Marland Kitchen aspirin EC 81 MG tablet Take 81 mg by mouth See admin instructions. Take 81 mg by mouth at bedtime on Mon/Wed/Fri (only)  . atenolol (TENORMIN) 25 MG tablet Take 1 tablet (25 mg total) by mouth in the morning.  .  Cholecalciferol (VITAMIN D-3) 25 MCG (1000 UT) CAPS Take 1,000 Units by mouth daily with breakfast.  . escitalopram (LEXAPRO) 10 MG tablet Take 1 tablet (10 mg total) by mouth in the morning.  . fish oil-omega-3 fatty acids 1000 MG capsule Take 1 g by mouth 2 (two) times daily.  . furosemide (LASIX) 20 MG tablet Take 1 tablet (20 mg total) by mouth daily.  . hydrALAZINE (APRESOLINE) 50 MG tablet Take 1 tablet (50 mg total) by mouth every 8 (eight) hours. (Patient not taking: Reported on 08/04/2019)  . lactulose (CHRONULAC) 10 GM/15ML solution Take 45 mLs (30 g total) by mouth 3 (three) times daily.  Marland Kitchen lisinopril (ZESTRIL) 20 MG tablet Take 1 tablet (20 mg total) by mouth 2 (two) times daily.  . Multiple Vitamins-Minerals (ONE-A-DAY MENS 50+) TABS Take 1 tablet by mouth daily with breakfast.  . Polyethyl Glyc-Propyl Glyc PF (SYSTANE ULTRA PF) 0.4-0.3 % SOLN Place 1 drop into both eyes 2 (two) times daily as needed (for eye redness or burning).  . potassium chloride SA (KLOR-CON) 20 MEQ tablet Take 1 tablet (20 mEq total) by mouth daily.  . [DISCONTINUED] amitriptyline (ELAVIL) 25 MG tablet Take 1 tablet (25 mg total) by mouth at bedtime.   No facility-administered encounter medications on file as of 08/03/2019.    PHYSICAL EXAM:   General: Chronically ill, and  frail appearing elderly male sitting on sofa EENT:  Fullness of R neck Cardiovascular: regular rate and rhythm Pulmonary: clear all fields  Sats= 96% Abdomen: soft, nontender, + bowel sounds GU: no suprapubic tenderness Extremities: no edema Skin: exposed skin is intact, pale in color Neurological: A&O x3, Weakness but otherwise nonfocal  Gonzella Lex, NP-C

## 2019-08-17 ENCOUNTER — Other Ambulatory Visit: Payer: Self-pay

## 2019-08-17 ENCOUNTER — Encounter: Payer: Medicare Other | Admitting: Physical Medicine and Rehabilitation

## 2019-08-17 ENCOUNTER — Encounter: Payer: Self-pay | Admitting: Physical Medicine and Rehabilitation

## 2019-08-17 VITALS — BP 113/53 | HR 53 | Temp 97.5°F | Ht 66.0 in | Wt 189.0 lb

## 2019-08-17 DIAGNOSIS — K7290 Hepatic failure, unspecified without coma: Secondary | ICD-10-CM | POA: Diagnosis not present

## 2019-08-17 DIAGNOSIS — C799 Secondary malignant neoplasm of unspecified site: Secondary | ICD-10-CM

## 2019-08-17 DIAGNOSIS — R197 Diarrhea, unspecified: Secondary | ICD-10-CM | POA: Diagnosis not present

## 2019-08-17 DIAGNOSIS — I951 Orthostatic hypotension: Secondary | ICD-10-CM

## 2019-08-17 DIAGNOSIS — K7682 Hepatic encephalopathy: Secondary | ICD-10-CM

## 2019-08-17 MED ORDER — LACTULOSE 10 GM/15ML PO SOLN: 30.0000 g | Freq: Three times a day (TID) | ORAL | 3 refills | Status: AC

## 2019-08-17 NOTE — Progress Notes (Signed)
Subjective:    Patient ID: Ames Coupe, male    DOB: Jul 23, 1934, 85 y.o.   MRN: [id number]  HPI Mr. Montano presents today for follow-up of hepatic encephalopathy.   Receiving home therapies, performing HEP regularly, and ambulating within home. Cognition improved. Wife notes no memory or cognitive deficits at this time. Denies falls. Has been using RW with all ambulation. Has noted drop in BP with PT upon rise to stand, often accompanied by dizziness. Wife states he has been well hydrated.   Pain is 0/10.  BP improved to SBP 113. Did drop to 88 SBP on sit to stand.  Requires refill of Lactulose. Has diarrhea from this. Wears pull-ups. Has been limited socially by diarrhea. Repeat lactulose 66. Has PCP f/u with Dr. Virgina Jock scheduled soon and it will be determined whether he can stop this medication.  Plan to defer treatment of cancer at this time.   Sleeping well at night. No depression or anxiety.    Pain Inventory Average Pain 0 Pain Right Now 0 My pain is no pain  In the last 24 hours, has pain interfered with the following? General activity 0 Relation with others 0 Enjoyment of life 0 What TIME of day is your pain at its worst? no pain Sleep (in general) Good  Pain is worse with: no pain Pain improves with: no pain Relief from Meds: no pain  Mobility walk with assistance use a cane  Function retired  Neuro/Psych bladder control problems bowel control problems weakness trouble walking dizziness  Prior Studies Any changes since last visit?  no  Physicians involved in your care Any changes since last visit?  no   History reviewed. No pertinent family history. Social History   Socioeconomic History  . Marital status: Married    Spouse name: [name]  . Number of children: 2  . Years of education: 34  . Highest education level: Not on file  Occupational History  . Occupation: Network engineer.    Comment: Retired  Tobacco Use  . Smoking status:  Never Smoker  . Smokeless tobacco: Never Used  Substance and Sexual Activity  . Alcohol use: No    Alcohol/week: 0.0 standard drinks  . Drug use: No  . Sexual activity: Never  Other Topics Concern  . Not on file  Social History Narrative   Patient lives at home with his wife [name]). Patient is retired. High school education.   Left handed.   Caffeine- One cup daily.   Social Determinants of Health   Financial Resource Strain:   . Difficulty of Paying Living Expenses:   Food Insecurity:   . Worried About Charity fundraiser in the Last Year:   . Arboriculturist in the Last Year:   Transportation Needs:   . Film/video editor (Medical):   Marland Kitchen Lack of Transportation (Non-Medical):   Physical Activity:   . Days of Exercise per Week:   . Minutes of Exercise per Session:   Stress:   . Feeling of Stress :   Social Connections:   . Frequency of Communication with Friends and Family:   . Frequency of Social Gatherings with Friends and Family:   . Attends Religious Services:   . Active Member of Clubs or Organizations:   . Attends Archivist Meetings:   Marland Kitchen Marital Status:    Past Surgical History:  Procedure Laterality Date  . CATARACT EXTRACTION Bilateral    Past Medical History:  Diagnosis Date  .  Abnormality of gait   . GERD (gastroesophageal reflux disease)   . Headache(784.0)   . Hyperlipidemia   . Hypertension   . Migraine   . Myasthenia gravis without exacerbation ([place])   . Obesity   . Other extrapyramidal disease and abnormal movement disorder   . REM sleep behavior disorder   . Unspecified psychosis   . Weakness    There were no vitals taken for this visit.  Opioid Risk Score:   Fall Risk Score:  `1  Depression screen PHQ 2/9  Depression screen PHQ 2/9 08/04/2019  Decreased Interest 0  Down, Depressed, Hopeless 0  PHQ - 2 Score 0    Review of Systems  Constitutional: Negative.   HENT: Negative.   Eyes: Negative.   Respiratory:  Negative.   Gastrointestinal: Positive for diarrhea.  Endocrine: Negative.   Genitourinary: Positive for difficulty urinating.  Musculoskeletal: Positive for gait problem.  Allergic/Immunologic: Negative.   Neurological: Positive for dizziness and weakness.  Hematological: Bruises/bleeds easily.       Objective:   Physical Exam Constitutional: No distress . Vital signs reviewed.frail HEENT: EOMI, oral membranes moist Neck: supple Cardiovascular: RRR without murmur. No JVD  Respiratory/Chest: CTA Bilaterally without wheezes or rales. Normal effort  GI/Abdomen: BS +, non-tender, non-distended Ext: no clubbing, cyanosis, or edema Psych: pleasant and cooperative Skin: Warm and dry. Intact. Musc: No edema in extremities. No tenderness in extremities. Neuro: Alert. Follows basic commands. Fair insight and awareness. No significant cognitive deficits noted.  Motor: 4+/5 throughout Ambulates with steady, slow gait with RW.  Psych: Positive. Good sense of humor. Denies depression or anxiety.     Assessment & Plan:  1.Decreased functional mobilitysecondary to retropharyngeal right neck abscess/metastatic poorly differentiated carcinoma.  Tumor is not resectable. Oncology (Dr. Irene Limbo) following. Rad-Onc spoke to wife and decision was made that he would not want palliative XRT. Discussed with patient and wife and provided support for their decision.  Continue home PT, OT, and SLP  2. Pain management/Mood/Insomnia: Patient denies pain, depression, anxiety, or insomnia. May stop Amitriptyline.   3. Hepatic/metabolic encephalopathy. Ammonia level repeated and called patient and wife with result- increased from 30s to 74s. Continue lactulose 45 mg 3 times daily. Provided refill. Will be reassessed at PCP follow-up appointment.   4. Chronic diastolic congestive heart failure. Continue Lasix 40 mg daily.    5. Elevated liver enzymes and uptrending  ammonia: Provided GI referral. Wife will assess if still needed after next PCP appointment.   6. Orthostasis: orthostatic in office. Encouraged hydration and use of teds or abdominal binder during therapy if symptomatic. Can consider midodrine if fails to improve with more conservative treatments.   CBC, CMP, Vitamin D, and ammonia levels ordered and results discussed with patient and wife.   All questions were answered.  Please return to clinic PRN.

## 2019-08-17 NOTE — Patient Instructions (Signed)
If orthostatic, drink fluids, eat salty foods, compression garments, and abdominal binder.

## 2019-09-04 ENCOUNTER — Other Ambulatory Visit (HOSPITAL_COMMUNITY): Payer: Self-pay | Admitting: Otolaryngology

## 2019-09-04 ENCOUNTER — Other Ambulatory Visit: Payer: Self-pay | Admitting: Otolaryngology

## 2019-09-04 DIAGNOSIS — C770 Secondary and unspecified malignant neoplasm of lymph nodes of head, face and neck: Secondary | ICD-10-CM

## 2019-09-04 DIAGNOSIS — R221 Localized swelling, mass and lump, neck: Secondary | ICD-10-CM

## 2019-09-14 ENCOUNTER — Other Ambulatory Visit: Payer: Medicare Other | Admitting: Internal Medicine

## 2019-09-15 ENCOUNTER — Other Ambulatory Visit: Payer: Self-pay

## 2019-09-18 IMAGING — MR MR ABDOMEN WO/W CM
11 of 17 series · 28 of 48 positions shown · IV contrast (17ml multihance)
Comparison: 06/27/2014 and ultrasound from 12/14/2016

CLINICAL DATA: Evaluate liver mass.  Cirrhosis.

EXAM:
MRI ABDOMEN WITHOUT AND WITH CONTRAST
TECHNIQUE: Multiplanar multisequence MR imaging of the abdomen was performed
both before and after the administration of intravenous contrast.
CONTRAST:  17mL MULTIHANCE GADOBENATE DIMEGLUMINE 529 MG/ML IV SOLN

[Series 4: cor haste · coronal · 5.0mm · 0.74mm/px · 2 of 32 slices shown]
[im 1/32]
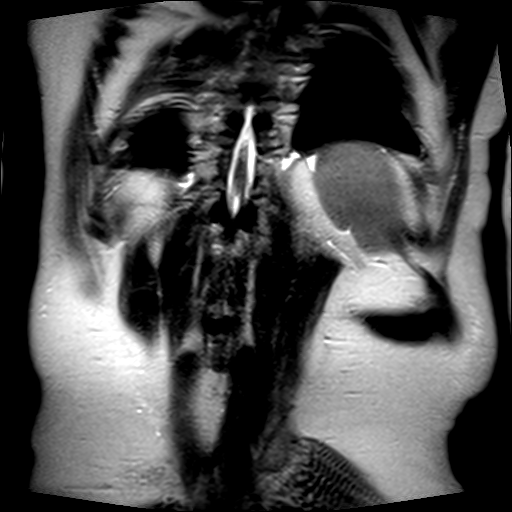
[im 32/32]
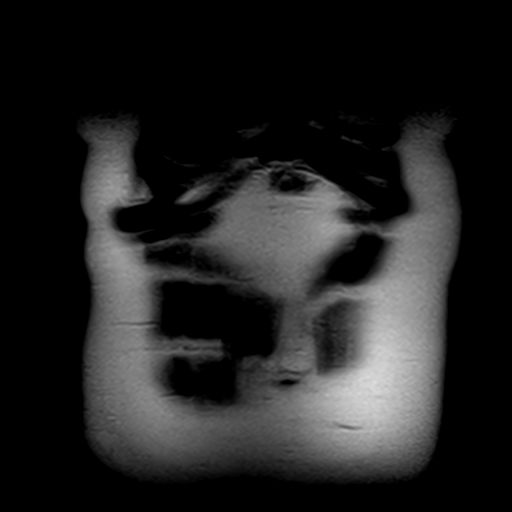

[Series 6: axial haste · axial · 6.0mm · 0.74mm/px · z∈[-15,+196]mm · 2 of 33 slices shown]
[im 1/33]
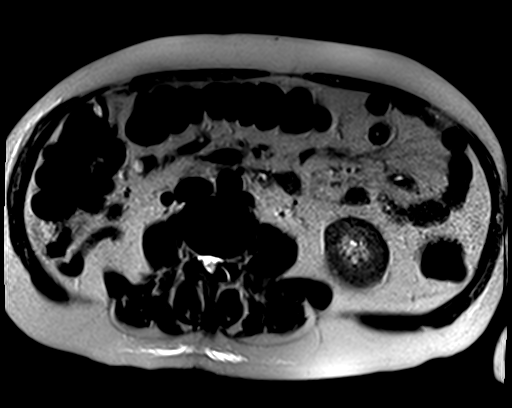
[im 33/33]
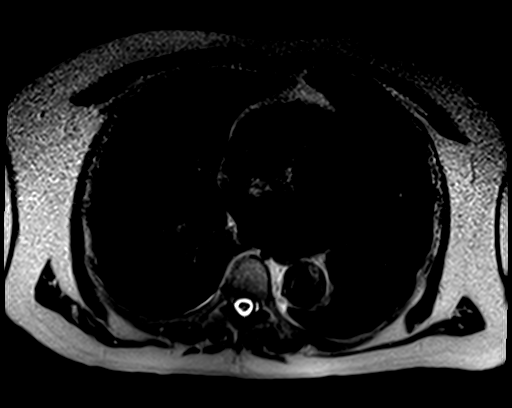

[Series 7: T1 · axial · 6.0mm · 0.74mm/px · z∈[-15,+196]mm · 4 of 66 slices shown]
[im 1/66]
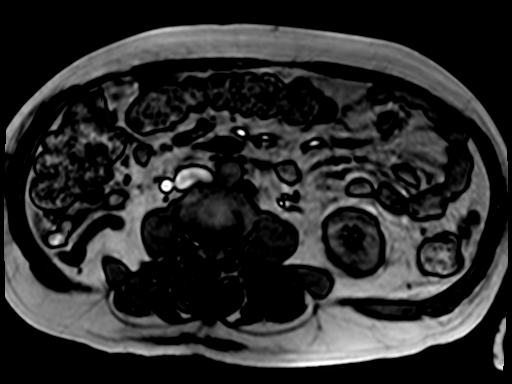
[im 22/66]
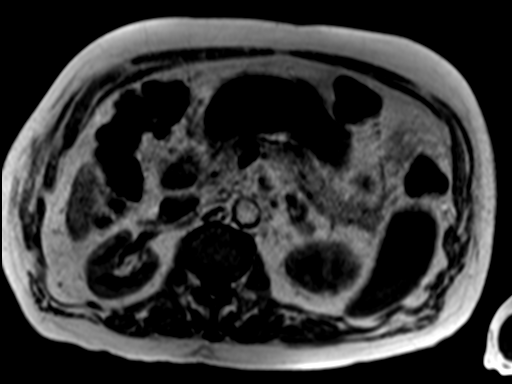
[im 44/66]
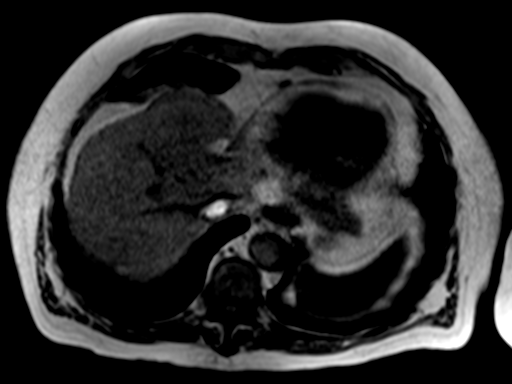
[im 66/66]
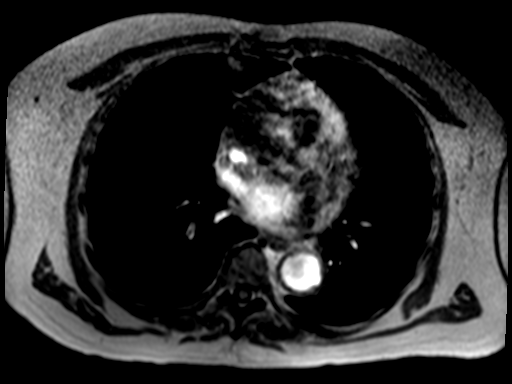

[Series 9: ep2d_[id number]_500_800_p2_trig · axial · 6.0mm · 1.98mm/px · z∈[-1,+208]mm · 5 of 90 slices shown]
[im 1/90]
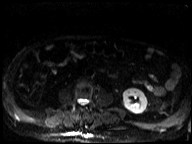
[im 23/90]
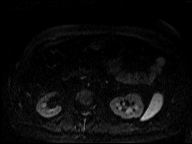
[im 45/90]
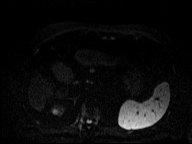
[im 67/90]
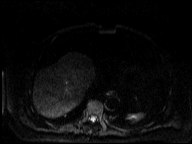
[im 90/90]
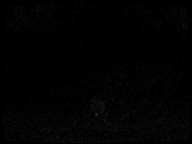

[Series 10: ep2d_[id number]_500_800_p2_trig_adc · axial · 6.0mm · 1.98mm/px · z∈[-1,+208]mm · 2 of 30 slices shown]
[im 1/30]
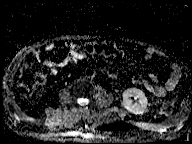
[im 30/30]
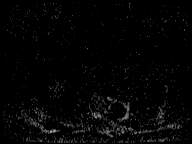

[Series 11: T2 · axial · 6.0mm · 1.12mm/px · 1 of 30 slices shown]
[im 1/30]
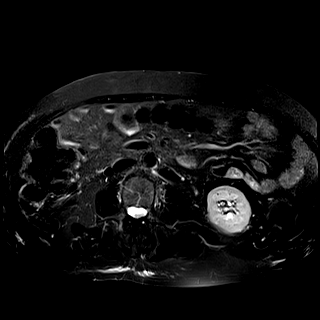

[Series 12: bSSFP · axial · 4.0mm · 0.74mm/px · z∈[+25,+189]mm · 2 of 42 slices shown]
[im 1/42]
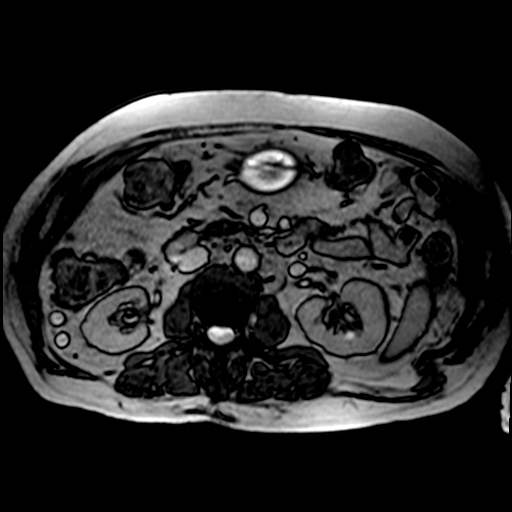
[im 42/42]
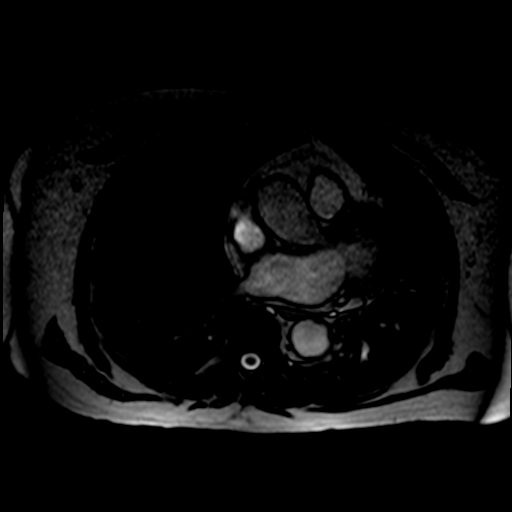

[Series 14: T1 dynamic · axial · non-contrast · 2.5mm · 0.74mm/px · z∈[+2,+200]mm · 3 of 80 slices shown]
[im 1/80]
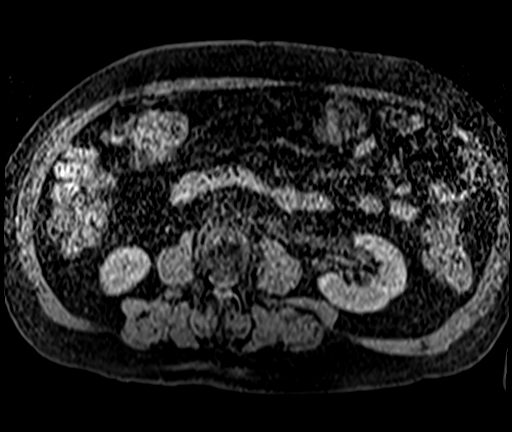
[im 40/80]
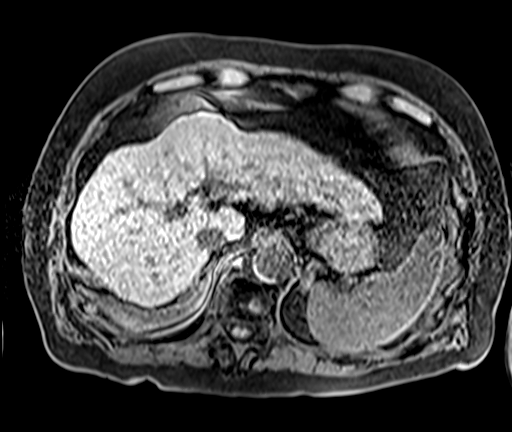
[im 80/80]
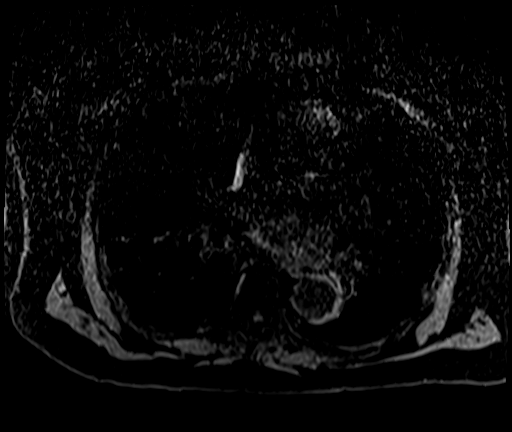

[Series 15: T1 dynamic post-contrast · axial · 2.5mm · 0.74mm/px · z∈[+2,+200]mm · 3 of 80 slices shown (1 of 3)]
[im 1/80]
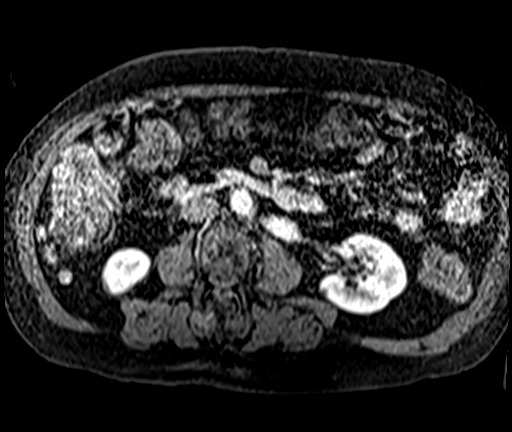
[im 40/80]
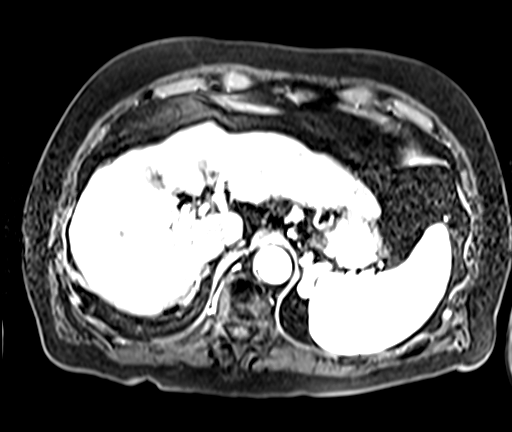
[im 80/80]
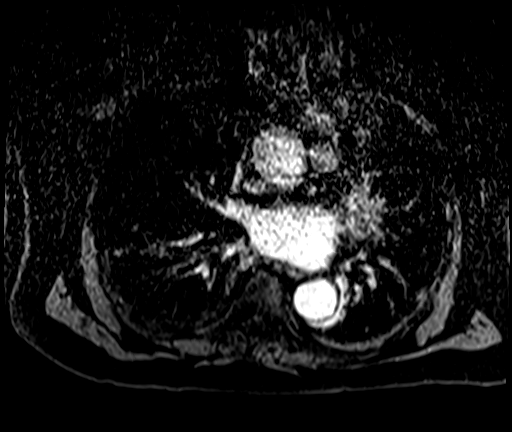

[Series 16: T1 dynamic post-contrast · axial · 2.5mm · 0.74mm/px · z∈[+2,+200]mm · 3 of 80 slices shown (2 of 3)]
[im 1/80]
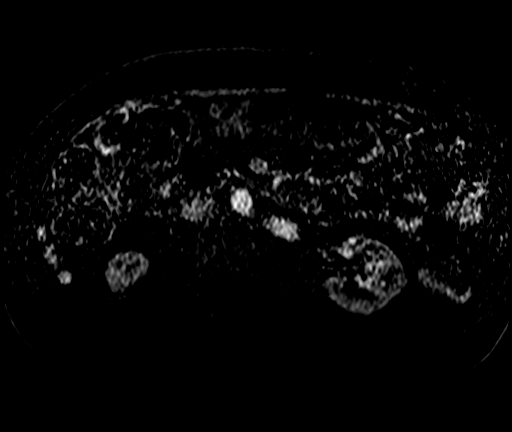
[im 40/80]
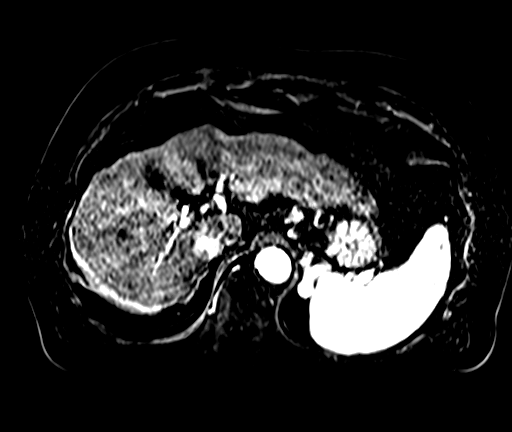
[im 80/80]
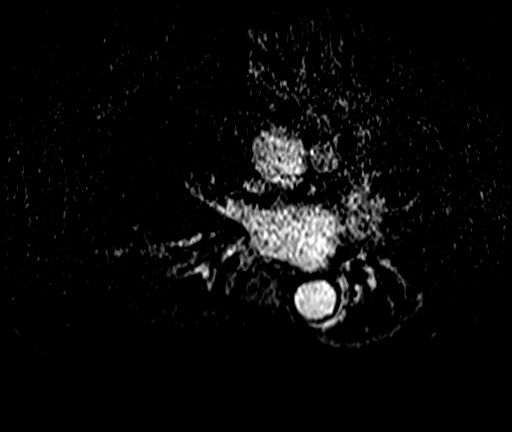

[Series 17: T1 dynamic post-contrast · axial · 2.5mm · 0.74mm/px · 1 of 80 slices shown (3 of 3)]
[im 1/80]
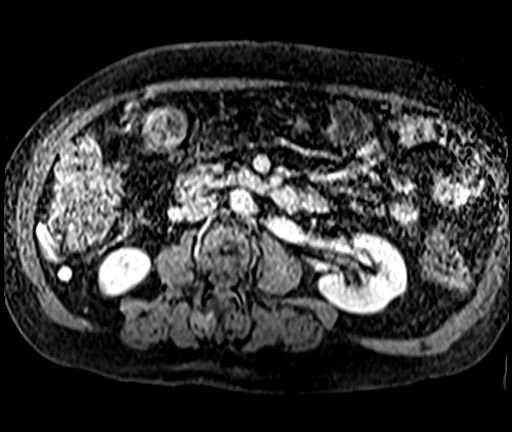

[28 of 48 positions shown; findings below may reference images not displayed]

FINDINGS: Lower chest: No acute findings.

Hepatobiliary: The liver has a the lobular contour. There is mild
hepatic steatosis. There is a T2 hyperintense lesion within the
right lobe of liver. This has enhancement characteristics compatible
with a benign hemangioma. No suspicious arterial phase enhancing
lesions identified suggestive of hepatoma. No abnormal areas of
washout noted on the delayed images. The gallbladder appears normal.
No biliary dilatation.

Pancreas: No mass, inflammatory changes, or other parenchymal
abnormality identified.

Spleen: The spleen measures 11.3 x 4.3 x 12.4 cm (volume = 320
cm^3).

Adrenals/Urinary Tract: Normal adrenal glands. Hemorrhagic cysts
within the mid right kidney measures 9 mm. No mass or hydronephrosis
identified. Arising from the upper pole of the right kidney is a
heterogeneous T2 hyperintense lesion which appears to exhibit
heterogeneous internal enhancement postcontrast, image 10 of series
21.

Stomach/Bowel: Visualized portions within the abdomen are
unremarkable.

Vascular/Lymphatic: Normal appearance of the abdominal aorta.
Splenorenal shunt identified within the left upper quadrant of the
abdomen. A second varicosity extends along the right pericolic
gutter. No adenopathy identified within the upper abdomen.

Other:  None.

Musculoskeletal: No suspicious bone lesions identified.
IMPRESSION: 1. Morphologic features of the liver compatible with cirrhosis. No
suspicious enhancing liver abnormalities to suggest hepatoma.
2. Liver hemangioma.
3. There is a suspicious, indeterminate and enhancing structure
arising from the upper pole of the right kidney. Cannot rule out
small renal cell carcinoma. Urologic consultation is suggested.
4. Stigmata of portal venous hypertension with upper abdominal
varicosities.

## 2019-10-09 ENCOUNTER — Other Ambulatory Visit: Payer: Self-pay | Admitting: Physical Medicine and Rehabilitation

## 2019-10-10 NOTE — Telephone Encounter (Signed)
Refill request for Amitriptyline.

## 2019-12-28 ENCOUNTER — Other Ambulatory Visit: Payer: Self-pay | Admitting: Physical Medicine and Rehabilitation

## 2020-03-19 ENCOUNTER — Other Ambulatory Visit: Payer: Medicare Other | Admitting: Internal Medicine

## 2020-03-19 ENCOUNTER — Other Ambulatory Visit: Payer: Self-pay

## 2020-03-19 DIAGNOSIS — K7469 Other cirrhosis of liver: Secondary | ICD-10-CM

## 2020-03-19 DIAGNOSIS — C799 Secondary malignant neoplasm of unspecified site: Secondary | ICD-10-CM

## 2020-03-19 DIAGNOSIS — C760 Malignant neoplasm of head, face and neck: Secondary | ICD-10-CM

## 2020-03-19 DIAGNOSIS — Z7189 Other specified counseling: Secondary | ICD-10-CM

## 2020-03-19 NOTE — Progress Notes (Addendum)
[facility] Consult Note Telephone: [phone]  Fax: [phone]  PATIENT NAME: Glen Finley DOB: 08/16/1934 MRN: [id number]  PRIMARY CARE PROVIDER:   Shon Baton, MD  REFERRING PROVIDER:  Shon Baton, MD [place],  [place] [address]  RESPONSIBLE PARTY:    Self and wife Glen Finley [phone]   [name],Glen Finley Spouse [phone]  [phone]   RECOMMENDATIONS and PLAN:  Palliative care encounter  Z51.5   1.Advance care planning:  Advanced directives were reviewed.  Patient is interested in treating what is treatable at this time.  He is still not interested in cancer treatment.  Consider transition to Hospice care if decline is related to progression of metastatic cancer or hepatic disease.   2.  Unsteady gait: Recurrent.  Multifactorial.  Fall risk prevention discussed.  Use walker as needed.  Consider hospital bed for improvement of entering and exiting bed and for improvement of repositioning.   3.  Decreased appetite and weight loss: Significant wt loss in past 3 weeks.  Continue small, frequent meals, meal replacement supplements and hydration.  Monitor weight weekly.  4.  Metastatic poorly differentiated carcinoma of neck:  No plans for cancer treatment.  Discussed transition to Hospice care if current level of decline is new normal and not reversible .  5.  Hepatic cirrhosis: Continue lactulose as prescribed.  Remain hydrated.  Evaluate serum ammonia level  I spent 60 minutes providing this consultation,  from 1300 to 1400. More than 50% of the time in this consultation was spent coordinating communication patient and wife/POA. Messages left for clinical staff of Dr. Virgina Finley to call this provider for updates and additional planning.   HISTORY OF PRESENT ILLNESS:  Follow-up with Glen Finley is a 85 y.o. year old male with multiple medical problems including cirrhosis with elevated ammonia, encephalopathy, myasthenia  gravis.  Wife and patient report recurrent weakness over past 3 weeks with need of use of walker.  Decreased appetite and weight loss.  He has been sleeping more during daytime hours.  He also has noticed increased growth of R neck masses with hoarseness.  Intermittent confusion.  Reports that he continues to take Lactulose as prescribed.   Denies dysphagia.  Requires more assistance with ADLs.  Palliative Care was asked to help address goals of care.   CODE STATUS: Full Code  PPS: 40% with assistance of a walker. Weak  HOSPICE ELIGIBILITY/DIAGNOSIS: YES/ poorly differentiated metastatic carcinoma of the neck  PAST MEDICAL HISTORY:  Past Medical History:  Diagnosis Date  . Abnormality of gait   . GERD (gastroesophageal reflux disease)   . Headache(784.0)   . Hyperlipidemia   . Hypertension   . Migraine   . Myasthenia gravis without exacerbation ([place])   . Obesity   . Other extrapyramidal disease and abnormal movement disorder   . REM sleep behavior disorder   . Unspecified psychosis   . Weakness     SOCIAL HX: Lives at home with wife.  PERTINENT MEDICATIONS:  Outpatient Encounter Medications as of 08/03/2019  Medication Sig  . alendronate (FOSAMAX) 70 MG tablet Take 70 mg by mouth every Sunday.   Marland Kitchen aspirin EC 81 MG tablet Take 81 mg by mouth See admin instructions. Take 81 mg by mouth at bedtime on Mon/Wed/Fri (only)  . atenolol (TENORMIN) 25 MG tablet Take 1 tablet (25 mg total) by mouth in the morning.  . Cholecalciferol (VITAMIN D-3) 25 MCG (1000 UT) CAPS Take 1,000 Units by mouth  daily with breakfast.  . escitalopram (LEXAPRO) 10 MG tablet Take 1 tablet (10 mg total) by mouth in the morning.  . fish oil-omega-3 fatty acids 1000 MG capsule Take 1 g by mouth 2 (two) times daily.  . furosemide (LASIX) 20 MG tablet Take 1 tablet (20 mg total) by mouth daily.  . hydrALAZINE (APRESOLINE) 50 MG tablet Take 1 tablet (50 mg total) by mouth every 8 (eight) hours. (Patient not taking:  Reported on 08/04/2019)  . lactulose (CHRONULAC) 10 GM/15ML solution Take 45 mLs (30 g total) by mouth 3 (three) times daily.  Marland Kitchen lisinopril (ZESTRIL) 20 MG tablet Take 1 tablet (20 mg total) by mouth 2 (two) times daily.  . Multiple Vitamins-Minerals (ONE-A-DAY MENS 50+) TABS Take 1 tablet by mouth daily with breakfast.  . Polyethyl Glyc-Propyl Glyc PF (SYSTANE ULTRA PF) 0.4-0.3 % SOLN Place 1 drop into both eyes 2 (two) times daily as needed (for eye redness or burning).  . potassium chloride SA (KLOR-CON) 20 MEQ tablet Take 1 tablet (20 mEq total) by mouth daily.  . [DISCONTINUED] amitriptyline (ELAVIL) 25 MG tablet Take 1 tablet (25 mg total) by mouth at bedtime.   No facility-administered encounter medications on file as of 08/03/2019.    PHYSICAL EXAM:   Vital signs:  BP  132/81   P 67  R 14  O2 sats  97%  General: Chronically ill, and  frail appearing elderly male sitting on sofa EENT: Dry mucus membranes. Mass effect of  R neck x 2 with extended fullness of R supraclavicular region Cardiovascular: regular rate and rhythm Pulmonary: clear all fields   Abdomen: soft, nontender, + bowel sounds Extremities:2+ edema of BLE Skin: Pale in color, exposed skin is intact Neurological: A&O x2, Weakness.  Speech is occ. muffled  Glen Lex, NP-C

## 2020-04-23 DEATH — deceased
